# Patient Record
Sex: Female | Born: 1960
Health system: Southern US, Community
[De-identification: ages and names within clinical notes are randomized; demographics above are authoritative.]

## PROBLEM LIST (undated history)

## (undated) DIAGNOSIS — E119 Type 2 diabetes mellitus without complications: Secondary | ICD-10-CM

## (undated) DIAGNOSIS — J45909 Unspecified asthma, uncomplicated: Secondary | ICD-10-CM

## (undated) DIAGNOSIS — I77811 Abdominal aortic ectasia: Secondary | ICD-10-CM

## (undated) DIAGNOSIS — R0600 Dyspnea, unspecified: Secondary | ICD-10-CM

## (undated) DIAGNOSIS — E785 Hyperlipidemia, unspecified: Secondary | ICD-10-CM

## (undated) DIAGNOSIS — I509 Heart failure, unspecified: Secondary | ICD-10-CM

## (undated) DIAGNOSIS — J439 Emphysema, unspecified: Secondary | ICD-10-CM

## (undated) DIAGNOSIS — I10 Essential (primary) hypertension: Secondary | ICD-10-CM

## (undated) DIAGNOSIS — I2510 Atherosclerotic heart disease of native coronary artery without angina pectoris: Secondary | ICD-10-CM

## (undated) DIAGNOSIS — Z9289 Personal history of other medical treatment: Secondary | ICD-10-CM

## (undated) DIAGNOSIS — J449 Chronic obstructive pulmonary disease, unspecified: Secondary | ICD-10-CM

## (undated) DIAGNOSIS — I739 Peripheral vascular disease, unspecified: Secondary | ICD-10-CM

## (undated) DIAGNOSIS — R911 Solitary pulmonary nodule: Secondary | ICD-10-CM

## (undated) DIAGNOSIS — Z720 Tobacco use: Secondary | ICD-10-CM

## (undated) DIAGNOSIS — K219 Gastro-esophageal reflux disease without esophagitis: Secondary | ICD-10-CM

## (undated) DIAGNOSIS — M5030 Other cervical disc degeneration, unspecified cervical region: Secondary | ICD-10-CM

## (undated) DIAGNOSIS — Z951 Presence of aortocoronary bypass graft: Secondary | ICD-10-CM

## (undated) DIAGNOSIS — R002 Palpitations: Secondary | ICD-10-CM

## (undated) DIAGNOSIS — C801 Malignant (primary) neoplasm, unspecified: Secondary | ICD-10-CM

## (undated) DIAGNOSIS — R05 Cough: Secondary | ICD-10-CM

## (undated) DIAGNOSIS — J9809 Other diseases of bronchus, not elsewhere classified: Secondary | ICD-10-CM

## (undated) DIAGNOSIS — D75839 Thrombocytosis, unspecified: Secondary | ICD-10-CM

## (undated) DIAGNOSIS — I272 Pulmonary hypertension, unspecified: Secondary | ICD-10-CM

## (undated) DIAGNOSIS — R053 Chronic cough: Secondary | ICD-10-CM

## (undated) DIAGNOSIS — D649 Anemia, unspecified: Secondary | ICD-10-CM

## (undated) DIAGNOSIS — I700 Atherosclerosis of aorta: Secondary | ICD-10-CM

## (undated) DIAGNOSIS — I779 Disorder of arteries and arterioles, unspecified: Secondary | ICD-10-CM

## (undated) DIAGNOSIS — I2089 Other forms of angina pectoris: Secondary | ICD-10-CM

## (undated) DIAGNOSIS — A419 Sepsis, unspecified organism: Secondary | ICD-10-CM

## (undated) DIAGNOSIS — Z7982 Long term (current) use of aspirin: Secondary | ICD-10-CM

## (undated) DIAGNOSIS — I701 Atherosclerosis of renal artery: Secondary | ICD-10-CM

## (undated) DIAGNOSIS — Z91148 Patient's other noncompliance with medication regimen for other reason: Secondary | ICD-10-CM

## (undated) HISTORY — DX: Palpitations: R00.2

## (undated) HISTORY — DX: Type 2 diabetes mellitus without complications: E11.9

## (undated) HISTORY — PX: CARDIAC CATHETERIZATION: SHX172

## (undated) HISTORY — DX: Personal history of other medical treatment: Z92.89

## (undated) HISTORY — DX: Solitary pulmonary nodule: R91.1

## (undated) HISTORY — PX: LAPAROSCOPIC CHOLECYSTECTOMY: SUR755

## (undated) HISTORY — DX: Tobacco use: Z72.0

## (undated) HISTORY — DX: Chronic cough: R05.3

## (undated) HISTORY — PX: BREAST CYST EXCISION: SHX579

## (undated) HISTORY — DX: Disorder of arteries and arterioles, unspecified: I77.9

## (undated) HISTORY — DX: Cough: R05

## (undated) HISTORY — DX: Abdominal aortic ectasia: I77.811

## (undated) HISTORY — PX: DILATION AND CURETTAGE OF UTERUS: SHX78

## (undated) HISTORY — DX: Peripheral vascular disease, unspecified: I73.9

## (undated) HISTORY — DX: Atherosclerosis of renal artery: I70.1

---

## 1998-12-05 ENCOUNTER — Emergency Department (HOSPITAL_COMMUNITY): Admission: EM | Admit: 1998-12-05 | Discharge: 1998-12-05 | Payer: Self-pay | Admitting: Emergency Medicine

## 1999-05-11 ENCOUNTER — Encounter: Admission: RE | Admit: 1999-05-11 | Discharge: 1999-05-11 | Payer: Self-pay | Admitting: Internal Medicine

## 1999-05-14 ENCOUNTER — Encounter: Admission: RE | Admit: 1999-05-14 | Discharge: 1999-05-14 | Payer: Self-pay | Admitting: Internal Medicine

## 1999-06-06 ENCOUNTER — Encounter: Admission: RE | Admit: 1999-06-06 | Discharge: 1999-06-06 | Payer: Self-pay | Admitting: Internal Medicine

## 2000-05-31 ENCOUNTER — Encounter: Payer: Self-pay | Admitting: *Deleted

## 2000-05-31 ENCOUNTER — Inpatient Hospital Stay (HOSPITAL_COMMUNITY): Admission: AD | Admit: 2000-05-31 | Discharge: 2000-05-31 | Payer: Self-pay | Admitting: *Deleted

## 2000-08-21 ENCOUNTER — Encounter: Admission: RE | Admit: 2000-08-21 | Discharge: 2000-08-21 | Payer: Self-pay

## 2000-09-03 ENCOUNTER — Ambulatory Visit (HOSPITAL_COMMUNITY): Admission: RE | Admit: 2000-09-03 | Discharge: 2000-09-03 | Payer: Self-pay | Admitting: *Deleted

## 2001-02-01 ENCOUNTER — Emergency Department (HOSPITAL_COMMUNITY): Admission: EM | Admit: 2001-02-01 | Discharge: 2001-02-01 | Payer: Self-pay | Admitting: *Deleted

## 2001-08-17 ENCOUNTER — Emergency Department (HOSPITAL_COMMUNITY): Admission: EM | Admit: 2001-08-17 | Discharge: 2001-08-18 | Payer: Self-pay | Admitting: Emergency Medicine

## 2001-10-26 ENCOUNTER — Encounter: Admission: RE | Admit: 2001-10-26 | Discharge: 2001-10-26 | Payer: Self-pay

## 2001-12-09 ENCOUNTER — Encounter: Admission: RE | Admit: 2001-12-09 | Discharge: 2001-12-09 | Payer: Self-pay

## 2002-07-03 ENCOUNTER — Emergency Department (HOSPITAL_COMMUNITY): Admission: EM | Admit: 2002-07-03 | Discharge: 2002-07-03 | Payer: Self-pay | Admitting: Emergency Medicine

## 2002-07-03 ENCOUNTER — Encounter: Payer: Self-pay | Admitting: Emergency Medicine

## 2002-09-21 ENCOUNTER — Encounter: Admission: RE | Admit: 2002-09-21 | Discharge: 2002-09-21 | Payer: Self-pay | Admitting: Internal Medicine

## 2003-03-06 ENCOUNTER — Emergency Department (HOSPITAL_COMMUNITY): Admission: EM | Admit: 2003-03-06 | Discharge: 2003-03-06 | Payer: Self-pay | Admitting: *Deleted

## 2003-03-10 ENCOUNTER — Encounter: Admission: RE | Admit: 2003-03-10 | Discharge: 2003-03-10 | Payer: Self-pay | Admitting: Internal Medicine

## 2003-03-10 ENCOUNTER — Inpatient Hospital Stay (HOSPITAL_COMMUNITY): Admission: AD | Admit: 2003-03-10 | Discharge: 2003-03-11 | Payer: Self-pay | Admitting: Internal Medicine

## 2003-04-17 ENCOUNTER — Encounter: Payer: Self-pay | Admitting: Emergency Medicine

## 2003-04-17 ENCOUNTER — Emergency Department (HOSPITAL_COMMUNITY): Admission: EM | Admit: 2003-04-17 | Discharge: 2003-04-17 | Payer: Self-pay | Admitting: Emergency Medicine

## 2003-05-10 ENCOUNTER — Encounter: Payer: Self-pay | Admitting: Surgery

## 2003-05-10 ENCOUNTER — Ambulatory Visit (HOSPITAL_COMMUNITY): Admission: RE | Admit: 2003-05-10 | Discharge: 2003-05-10 | Payer: Self-pay | Admitting: Surgery

## 2003-07-01 ENCOUNTER — Encounter: Payer: Self-pay | Admitting: Surgery

## 2003-07-06 ENCOUNTER — Encounter (INDEPENDENT_AMBULATORY_CARE_PROVIDER_SITE_OTHER): Payer: Self-pay | Admitting: *Deleted

## 2003-07-06 ENCOUNTER — Encounter: Payer: Self-pay | Admitting: Surgery

## 2003-07-06 ENCOUNTER — Ambulatory Visit (HOSPITAL_COMMUNITY): Admission: RE | Admit: 2003-07-06 | Discharge: 2003-07-07 | Payer: Self-pay | Admitting: Surgery

## 2005-03-18 ENCOUNTER — Ambulatory Visit: Payer: Self-pay | Admitting: Internal Medicine

## 2005-03-31 ENCOUNTER — Emergency Department (HOSPITAL_COMMUNITY): Admission: EM | Admit: 2005-03-31 | Discharge: 2005-03-31 | Payer: Self-pay | Admitting: Emergency Medicine

## 2005-06-21 ENCOUNTER — Ambulatory Visit (HOSPITAL_COMMUNITY): Admission: RE | Admit: 2005-06-21 | Discharge: 2005-06-21 | Payer: Self-pay | Admitting: Internal Medicine

## 2005-06-21 ENCOUNTER — Ambulatory Visit: Payer: Self-pay | Admitting: Internal Medicine

## 2007-11-25 ENCOUNTER — Emergency Department (HOSPITAL_COMMUNITY): Admission: EM | Admit: 2007-11-25 | Discharge: 2007-11-25 | Payer: Self-pay | Admitting: Family Medicine

## 2011-03-15 NOTE — Discharge Summary (Signed)
   NAME:  Caitlyn Herrera, Caitlyn Herrera NO.:  [id number]   MEDICAL RECORD NO.:  [id number]                   PATIENT TYPE:  OIB   LOCATION:  5703                                 FACILITY:  [facility]   PHYSICIAN:  Thornton Park. Daphine Deutscher, M.D.             DATE OF BIRTH:  [birth year]   DATE OF ADMISSION:  07/06/2003  DATE OF DISCHARGE:  07/07/2003                                 DISCHARGE SUMMARY   ADMITTING DIAGNOSIS:  Chronic cholecystitis.   DISCHARGE DIAGNOSIS:  Chronic cholecystitis, moderately severe, status post  laparoscopic cholecystectomy with intraoperative cholangiogram.   COURSE IN THE HOSPITAL:  Caitlyn Herrera is a 50 year old black female  previously admitted to the teaching service with recurrent bouts of upper  abdominal pain and some gastroesophageal reflux.  She was taken to the  operating room on Wednesday morning, July 06, 2003, and underwent a  laparoscopic cholecystectomy.  She had marked inflammatory changes in the  infundibulum and it was stuck down there suggesting chronic cholecystitis of  moderate-to-severe nature.  She had adhesions to her duodenum, which were  taken down.  Her gallbladder was removed abdomen a cholangiogram looked  unremarkable.   Postoperatively she did well and was discharged in Thursday, July 07, 2003.   CONDITION ON DISCHARGE:  Improved.   PLAN:  Return to the office in three weeks.                                                  Thornton Park Daphine Deutscher, M.D.    MBM/MEDQ  D:  07/08/2003  T:  07/09/2003  Job:  [id number]   cc:   [facility]

## 2011-03-15 NOTE — Discharge Summary (Signed)
NAME:  Caitlyn Herrera, Caitlyn Herrera NO.:  [id number]   MEDICAL RECORD NO.:  [id number]                   PATIENT TYPE:  INP   LOCATION:  5712                                 FACILITY:  [facility]   PHYSICIAN:  Talmage Coin, M.D.                  DATE OF BIRTH:  1961/08/05   DATE OF ADMISSION:  03/10/2003  DATE OF DISCHARGE:  03/11/2003                                 DISCHARGE SUMMARY   DISCHARGE DIAGNOSES:  1. Nausea and vomiting with gastroesophageal reflux disease/peptic ulcer     disease.  2. Trichomoniasis.  3. Urine drug screen positive for cocaine and marijuana.  4. Questionable urinary tract infection.  5. Tobacco abuse.  6. Dysmenorrhea.   DISCHARGE MEDICATIONS:  1. Phenergan 12.5 mg p.o. q.4-6h. p.r.n. nausea and vomiting.  2. Protonix 40 mg p.o. q.d.  3. Vicodin 5/500 1-2 tablets p.o. q.4-6h. p.r.n. pain.   DISPOSITION:  The patient will follow up at [facility]  outpatient clinic with Dr. Zoe Lan on June 7 at 8:30 AM. She will be given  a trial of proton pump inhibitors until then to evaluate for GERD/PUD. If  symptoms do not resolve consider triple therapy for Helicobacter pylori  infection and/or EGD.   HISTORY OF PRESENT ILLNESS:  The patient is a 50 year old African American  female with nausea, vomiting of three months' duration in addition to mid  epigastric abdominal pain for the past week. The pain is worse after eating  spicy food and fried food and is relieved with a Percocet before eating. The  nausea is worse in the morning, so she avoids breakfast and usually improves  by noon enabling her to eat lunch and dinner. However, she can get nausea  and/or vomiting at any time of day.  She denies diarrhea, constipation,  hematemesis and hematochezia, but thinks she may have had bilious vomit  twice in the past week.   PHYSICAL EXAMINATION:  Findings on admission included temperature 97, pulse  70, blood pressure 196/83.   Respirations 15.  Significant physical exam  findings include a soft, obese, nondistended abdomen, diffusely tender to  palpation, but more so in the mid epigastric region. Positive soft bowel  sounds.  No hepatosplenomegaly. No masses. Stool was heme negative. The  remainder of the physical examination was within normal limits.   KEY ADMISSION LABS:  Acute abdominal series was negative for obstruction,  ileus and free air. EKG and cardiac enzymes were negative. LFTs and lipase  were within normal limits.   HOSPITAL COURSE:  1. Nausea, vomiting - likely GERD/PUD.  The patient was made NPO, received     IV fluids for hydration, morphine for pain, Zofran and Phenergan for     nausea and vomiting. Protonix for GERD/PUD prophylaxis. She was symptom     free for 24 hours and was switched to p.o. medication. Morphine was     discontinued  and she was started on Vicodin for pain and she remains pain     free. She was given potassium chloride to replete her potassium which was     3.4.  Her diet was advanced to clear liquids which she tolerated without     nausea and vomiting. Her diet was then advanced to regular which she also     tolerated without nausea or vomiting and she was ambulating without     difficulty.  2. Trichomoniasis. The patient was found to have trichomonas on urinalysis     and was treated with Flagyl 2 grams p.o. x 1.  3. Urine drug screen positive for cocaine and marijuana. The patient denied     cocaine use, but did acknowledge marijuana use about once a month and she     was counseled to avoid drugs.  4. Questionable UTI. Urinalysis showed small leukocytes, many bacteria and     many squamous epithelial cells.  The squamous epithelial cells increased     the likelihood of contamination. Urine culture is pending and the patient     will be treated with Cipro if urine culture is positive.  5. Tobacco abuse. The patient was counseled to avoid tobacco.  6. Dysmenorrhea. The  patient reports not having menses in the past few     months and reports having hot flashes. The patient likely to be     perimenopausal.  Uterine pregnancy test was negative. This will be worked     up further in an outpatient setting.   DISPOSITION:  The patient discharged home in stable condition. Will follow  up with Dr. Zoe Lan as mentioned above.     Oretha Ellis, M.D.                      Talmage Coin, M.D.    BT/MEDQ  D:  03/11/2003  T:  03/12/2003  Job:  [id number]   cc:   [facility]

## 2011-03-15 NOTE — Op Note (Signed)
   NAME:  Caitlyn Herrera, Caitlyn Herrera NO.:  [id number]   MEDICAL RECORD NO.:  [id number]                   PATIENT TYPE:  OIB   LOCATION:  5703                                 FACILITY:  [facility]   PHYSICIAN:  Thornton Park. Daphine Deutscher, M.D.             DATE OF BIRTH:  [birth year]   DATE OF PROCEDURE:  07/06/2003  DATE OF DISCHARGE:                                 OPERATIVE REPORT   PREOPERATIVE DIAGNOSIS:  Chronic cholecystitis.   POSTOPERATIVE DIAGNOSIS:  Chronic cholecystitis.   OPERATION PERFORMED:  Laparoscopic cholecystectomy with intraoperative  cholangiogram.   SURGEON:  Thornton Park. Daphine Deutscher, M.D.   ASSISTANT:  Anselm Pancoast. Zachery Dakins, M.D.   ANESTHESIA:  General endotracheal.   OPERATIVE FINDINGS:  Marked chronic cholecystitis.   DESCRIPTION OF PROCEDURE:  [name] is a 50 year old lady who was  taken to room 16 and given general anesthesia.  The abdomen was prepped with  Betadine and draped sterilely.  A longitudinal incision was made down to the  umbilicus and the Hasson cannula was inserted.  We came in through a bunch  of omental adhesions, went to the right and went up in and immediately saw  the gallbladder which was contracted, whitened and grayish and full of  stones.  It was elevated and I first dissected an impacted infundibulum off  the cystic duct.  I then put a clip up on the gallbladder, opened the cystic  duct and milked out a bunch of impacted stone material.  We then did a  cholangiogram which showed quick filling of the common duct and prompt flow  into the duodenum.  I then triple clipped the cystic duct, divided it and I  removed the gallbladder from the gallbladder bed without entering it.  Bleeding was controlled with clips.  Once detached, the gallbladder was  brought out through the umbilicus without difficulty.  The gallbladder bed  was irrigated and no bleeding or bile leaks were noted.  The umbilical port  was repaired with 0  Vicryl under laparoscopic vision.  Omentum was stuck up  around this to previous scar.  The abdomen was then deflated and trocars  were withdrawn and then the skin was closed with 4-0  Vicryl. The wounds  were injected with 0.5% Marcaine.  The patient tolerated the procedure well  and was taken to the recovery room in satisfactory condition.   FINAL DIAGNOSIS:  Marked chronic cholecystitis.                                               Thornton Park Daphine Deutscher, M.D.    MBM/MEDQ  D:  07/06/2003  T:  07/06/2003  Job:  [id number]

## 2011-07-18 LAB — INFLUENZA A AND B ANTIGEN (CONVERTED LAB): Inflenza A Ag: NEGATIVE

## 2011-08-12 ENCOUNTER — Emergency Department (HOSPITAL_COMMUNITY)
Admission: EM | Admit: 2011-08-12 | Discharge: 2011-08-12 | Disposition: A | Discharge status: Home / Self Care | Payer: Self-pay | Attending: Emergency Medicine | Admitting: Emergency Medicine

## 2011-08-12 DIAGNOSIS — N751 Abscess of Bartholin's gland: Secondary | ICD-10-CM | POA: Insufficient documentation

## 2011-08-12 DIAGNOSIS — A5901 Trichomonal vulvovaginitis: Secondary | ICD-10-CM | POA: Insufficient documentation

## 2011-08-12 DIAGNOSIS — N898 Other specified noninflammatory disorders of vagina: Secondary | ICD-10-CM | POA: Insufficient documentation

## 2011-08-12 DIAGNOSIS — H6120 Impacted cerumen, unspecified ear: Secondary | ICD-10-CM | POA: Insufficient documentation

## 2011-08-12 DIAGNOSIS — H9209 Otalgia, unspecified ear: Secondary | ICD-10-CM | POA: Insufficient documentation

## 2011-08-12 DIAGNOSIS — I10 Essential (primary) hypertension: Secondary | ICD-10-CM | POA: Insufficient documentation

## 2011-08-12 DIAGNOSIS — J029 Acute pharyngitis, unspecified: Secondary | ICD-10-CM | POA: Insufficient documentation

## 2011-08-12 DIAGNOSIS — N9089 Other specified noninflammatory disorders of vulva and perineum: Secondary | ICD-10-CM | POA: Insufficient documentation

## 2011-08-12 LAB — RAPID STREP SCREEN (MED CTR MEBANE ONLY): Streptococcus, Group A Screen (Direct): NEGATIVE

## 2011-08-12 LAB — WET PREP, GENITAL: Yeast Wet Prep HPF POC: NONE SEEN

## 2011-08-15 LAB — CULTURE, ROUTINE-ABSCESS

## 2016-12-11 ENCOUNTER — Encounter: Payer: Self-pay | Admitting: Emergency Medicine

## 2016-12-11 DIAGNOSIS — I10 Essential (primary) hypertension: Secondary | ICD-10-CM | POA: Diagnosis not present

## 2016-12-11 DIAGNOSIS — N390 Urinary tract infection, site not specified: Secondary | ICD-10-CM | POA: Diagnosis not present

## 2016-12-11 DIAGNOSIS — R112 Nausea with vomiting, unspecified: Secondary | ICD-10-CM | POA: Diagnosis present

## 2016-12-11 DIAGNOSIS — Z87891 Personal history of nicotine dependence: Secondary | ICD-10-CM | POA: Insufficient documentation

## 2016-12-11 DIAGNOSIS — Z79899 Other long term (current) drug therapy: Secondary | ICD-10-CM | POA: Diagnosis not present

## 2016-12-11 MED ORDER — ONDANSETRON HCL 4 MG/2ML IJ SOLN
4.0000 mg | Freq: Once | INTRAMUSCULAR | Status: AC | PRN
  Administered 2016-12-12: 4 mg via INTRAVENOUS
  Filled 2016-12-11: qty 2

## 2016-12-11 NOTE — ED Triage Notes (Signed)
Patient to ER for c/o N/V/D since Saturday with intermittent fever as high as 100.0. Patient also reports dark stools with generalized abd pain. Patient states she has had poor intake for last two days. Patient ambulatory to triage without difficulty. Mild tachycardia present throughout triage.

## 2016-12-12 ENCOUNTER — Emergency Department
Admission: EM | Admit: 2016-12-12 | Discharge: 2016-12-12 | Disposition: A | Discharge status: Home / Self Care | Payer: BLUE CROSS/BLUE SHIELD | Attending: Emergency Medicine | Admitting: Emergency Medicine

## 2016-12-12 DIAGNOSIS — R197 Diarrhea, unspecified: Secondary | ICD-10-CM

## 2016-12-12 DIAGNOSIS — R112 Nausea with vomiting, unspecified: Secondary | ICD-10-CM

## 2016-12-12 DIAGNOSIS — R1030 Lower abdominal pain, unspecified: Secondary | ICD-10-CM

## 2016-12-12 DIAGNOSIS — N390 Urinary tract infection, site not specified: Secondary | ICD-10-CM

## 2016-12-12 HISTORY — DX: Essential (primary) hypertension: I10

## 2016-12-12 HISTORY — DX: Hyperlipidemia, unspecified: E78.5

## 2016-12-12 LAB — COMPREHENSIVE METABOLIC PANEL
ALBUMIN: 3.6 g/dL (ref 3.5–5.0)
ALT: 17 U/L (ref 14–54)
ANION GAP: 9 (ref 5–15)
AST: 29 U/L (ref 15–41)
Alkaline Phosphatase: 80 U/L (ref 38–126)
BUN: 11 mg/dL (ref 6–20)
CHLORIDE: 106 mmol/L (ref 101–111)
CO2: 21 mmol/L — AB (ref 22–32)
Calcium: 8.5 mg/dL — ABNORMAL LOW (ref 8.9–10.3)
Creatinine, Ser: 0.9 mg/dL (ref 0.44–1.00)
GFR calc Af Amer: >60 mL/min (ref >60)
GFR calc non Af Amer: >60 mL/min (ref >60)
GLUCOSE: 167 mg/dL — AB (ref 65–99)
POTASSIUM: 3.8 mmol/L (ref 3.5–5.1)
SODIUM: 136 mmol/L (ref 135–145)
Total Bilirubin: 0.9 mg/dL (ref 0.3–1.2)
Total Protein: 7.9 g/dL (ref 6.5–8.1)

## 2016-12-12 LAB — CBC
HCT: 39.5 % (ref 35.0–47.0)
HEMOGLOBIN: 13.7 g/dL (ref 12.0–16.0)
MCH: 32 pg (ref 26.0–34.0)
MCHC: 34.8 g/dL (ref 32.0–36.0)
MCV: 91.9 fL (ref 80.0–100.0)
Platelets: 371 10*3/uL (ref 150–440)
RBC: 4.29 MIL/uL (ref 3.80–5.20)
RDW: 13.2 % (ref 11.5–14.5)
WBC: 17.4 10*3/uL — ABNORMAL HIGH (ref 3.6–11.0)

## 2016-12-12 LAB — LIPASE, BLOOD: LIPASE: 58 U/L — AB (ref 11–51)

## 2016-12-12 LAB — URINALYSIS, COMPLETE (UACMP) WITH MICROSCOPIC
Bilirubin Urine: NEGATIVE
Glucose, UA: 50 mg/dL — AB
Ketones, ur: NEGATIVE mg/dL
Nitrite: NEGATIVE
PROTEIN: 100 mg/dL — AB
Specific Gravity, Urine: 1.016 (ref 1.005–1.030)
pH: 5 (ref 5.0–8.0)

## 2016-12-12 MED ORDER — CEPHALEXIN 500 MG PO CAPS: 500.0000 mg | ORAL_CAPSULE | Freq: Three times a day (TID) | ORAL | 0 refills | Status: DC

## 2016-12-12 MED ORDER — CEFTRIAXONE SODIUM-DEXTROSE 1-3.74 GM-% IV SOLR: 1.0000 g | Freq: Once | INTRAVENOUS | Status: DC

## 2016-12-12 MED ORDER — MORPHINE SULFATE (PF) 4 MG/ML IV SOLN
4.0000 mg | Freq: Once | INTRAVENOUS | Status: AC
  Administered 2016-12-12: 4 mg via INTRAVENOUS
  Filled 2016-12-12: qty 1

## 2016-12-12 MED ORDER — DEXTROSE 5 % IV SOLN: 1.0000 g | INTRAVENOUS | Status: DC

## 2016-12-12 MED ORDER — CEFTRIAXONE SODIUM-DEXTROSE 1-3.74 GM-% IV SOLR
1.0000 g | INTRAVENOUS | Status: DC
  Administered 2016-12-12: 1 g via INTRAVENOUS

## 2016-12-12 MED ORDER — ONDANSETRON 4 MG PO TBDP: 4.0000 mg | ORAL_TABLET | Freq: Three times a day (TID) | ORAL | 0 refills | Status: DC | PRN

## 2016-12-12 MED ORDER — ONDANSETRON HCL 4 MG/2ML IJ SOLN
4.0000 mg | Freq: Once | INTRAMUSCULAR | Status: AC
  Administered 2016-12-12: 4 mg via INTRAVENOUS
  Filled 2016-12-12: qty 2

## 2016-12-12 MED ORDER — CEFTRIAXONE SODIUM-DEXTROSE 1-3.74 GM-% IV SOLR
INTRAVENOUS | Status: AC
  Administered 2016-12-12: 1 g via INTRAVENOUS
  Filled 2016-12-12: qty 50

## 2016-12-12 MED ORDER — SODIUM CHLORIDE 0.9 % IV BOLUS (SEPSIS)
1000.0000 mL | Freq: Once | INTRAVENOUS | Status: AC
Start: 2016-12-12 — End: 2016-12-12
  Administered 2016-12-12: 1000 mL via INTRAVENOUS

## 2016-12-12 NOTE — ED Provider Notes (Signed)
[facility] Emergency Department Provider Note   ____________________________________________   First MD Initiated Contact with Patient 12/12/16 [phone]     (approximate)  I have reviewed the triage vital signs and the nursing notes.   HISTORY  Chief Complaint Emesis and Diarrhea    HPI Caitlyn Herrera is a 56 y.o. female who presents to the ED from home with a chief complaint of nausea/vomiting/diarrhea and suprapubic abdominal pain. Onset of symptoms 5 days ago with intermittent fevers; maximum temperature 100F. Patient also notes dark stools. States she has had decreased oral intake for the past 2 days. Denies associated chest pain, shortness of breath, dysuria. Denies recent travel or trauma. Nothing makes her symptoms better or worse. Patient is not on anticoagulants.   Past Medical History:  Diagnosis Date  . Hyperlipidemia   . Hypertension     There are no active problems to display for this patient.   Past Surgical History:  Procedure Laterality Date  . CHOLECYSTECTOMY    . CYST EXCISION     Right breast    Prior to Admission medications   Not on File    Allergies Patient has no known allergies.  No family history on file.  Social History Social History  Substance Use Topics  . Smoking status: Former Games developermoker  . Smokeless tobacco: Never Used  . Alcohol use No    Review of Systems  Constitutional: Positive for fever. Eyes: No visual changes. ENT: No sore throat. Cardiovascular: Denies chest pain. Respiratory: Denies shortness of breath. Gastrointestinal: Positive for abdominal pain, nausea, vomiting and diarrhea.  No constipation. Genitourinary: Negative for dysuria. Musculoskeletal: Negative for back pain. Skin: Negative for rash. Neurological: Negative for headaches, focal weakness or numbness.  10-point ROS otherwise negative.  ____________________________________________   PHYSICAL EXAM:  VITAL SIGNS: ED  Triage Vitals  Enc Vitals Group     BP 12/11/16 2347 (!) 163/83     Pulse Rate 12/11/16 2347 (!) 118     Resp 12/11/16 2347 20     Temp 12/11/16 2347 99.1 F (37.3 C)     Temp Source 12/11/16 2347 Oral     SpO2 12/11/16 2347 99 %     Weight 12/11/16 2355 220 lb (99.8 kg)     Height 12/11/16 2355 5\' 7"  (1.702 m)     Head Circumference --      Peak Flow --      Pain Score 12/11/16 2356 10     Pain Loc --      Pain Edu? --      Excl. in GC? --     Constitutional: Alert and oriented. Well appearing and in no acute distress. Eyes: Conjunctivae are normal. PERRL. EOMI. Head: Atraumatic. Nose: No congestion/rhinnorhea. Mouth/Throat: Mucous membranes are moist.  Oropharynx non-erythematous. Neck: No stridor.   Cardiovascular: Normal rate, regular rhythm. Grossly normal heart sounds.  Good peripheral circulation. Respiratory: Normal respiratory effort.  No retractions. Lungs CTAB. Gastrointestinal: Soft and mildly tender to palpation and suprapubic area without rebound or guarding. No distention. No abdominal bruits. No CVA tenderness. Musculoskeletal: No lower extremity tenderness nor edema.  No joint effusions. Neurologic:  Normal speech and language. No gross focal neurologic deficits are appreciated. No gait instability. Skin:  Skin is warm, dry and intact. No rash noted. Psychiatric: Mood and affect are normal. Speech and behavior are normal.  ____________________________________________   LABS (all labs ordered are listed, but only abnormal results are displayed)  Labs Reviewed  CBC -  Abnormal; Notable for the following:       Result Value   WBC 17.4 (*)    All other components within normal limits  URINALYSIS, COMPLETE (UACMP) WITH MICROSCOPIC - Abnormal; Notable for the following:    Color, Urine YELLOW (*)    APPearance HAZY (*)    Glucose, UA 50 (*)    Hgb urine dipstick MODERATE (*)    Protein, ur 100 (*)    Leukocytes, UA SMALL (*)    Bacteria, UA RARE (*)     Squamous Epithelial / LPF 0-5 (*)    All other components within normal limits  LIPASE, BLOOD - Abnormal; Notable for the following:    Lipase 58 (*)    All other components within normal limits  COMPREHENSIVE METABOLIC PANEL - Abnormal; Notable for the following:    CO2 21 (*)    Glucose, Bld 167 (*)    Calcium 8.5 (*)    All other components within normal limits   ____________________________________________  EKG  ED ECG REPORT I, Stephenson Cichy J, the attending physician, personally viewed and interpreted this ECG.   Date: 12/12/2016  EKG Time: 0001  Rate: 101  Rhythm: sinus tachycardia  Axis: Normal  Intervals:none  ST&T Change: Nonspecific  ____________________________________________  RADIOLOGY  None ____________________________________________   PROCEDURES  Procedure(s) performed:   Rectal exam: External exam with nonthrombosed, nonbleeding hemorrhoid. Tan stool obtained on gloved finger which is heme negative.  Procedures  Critical Care performed: No  ____________________________________________   INITIAL IMPRESSION / ASSESSMENT AND PLAN / ED COURSE  Pertinent labs & imaging results that were available during my care of the patient were reviewed by me and considered in my medical decision making (see chart for details).  56 year old female who presents with suprapubic abdominal pain, nausea, vomiting and diarrhea. Laboratory urinalysis results remarkable for leukocytosis secondary to UTI. Abdominal exam is benign and patient does not have pain at McBurney's point; low suspicion for acute appendicitis especially in light of patient's 5 day illness. Will infuse IV fluids, analgesics, antiemetics, antibiotics and reassess.  Clinical Course as of Dec 12 520  Thu Dec 12, 2016  9604 Patient tolerated PO without emesis. Feeling much better. Will discharge home with prescriptions for Zofran and Keflex. Strict return precautions given. Patient verbalizes  understanding and agrees with plan of care.  [JS]    Clinical Course User Index [JS] Irean Hong, MD     ____________________________________________   FINAL CLINICAL IMPRESSION(S) / ED DIAGNOSES  Final diagnoses:  Nausea vomiting and diarrhea  Lower abdominal pain  Lower urinary tract infectious disease      NEW MEDICATIONS STARTED DURING THIS VISIT:  New Prescriptions   No medications on file     Note:  This document was prepared using Dragon voice recognition software and may include unintentional dictation errors.    Irean Hong, MD 12/12/16 [phone]

## 2016-12-12 NOTE — Discharge Instructions (Signed)
1. Take antibiotic as prescribed (Keflex 500 mg 3 times daily ×7 days). °2. You may take Zofran as needed for nausea. °3. Clear liquids ×12 hours, then BRAT diet ×3 days, then slowly advance diet as tolerated.  °4. Return to the ER for worsening symptoms, persistent vomiting, difficulty breathing or other concerns. °

## 2016-12-18 ENCOUNTER — Encounter (HOSPITAL_COMMUNITY): Payer: Self-pay | Admitting: Emergency Medicine

## 2016-12-18 ENCOUNTER — Ambulatory Visit (HOSPITAL_COMMUNITY)
Admission: EM | Admit: 2016-12-18 | Discharge: 2016-12-18 | Discharge status: Against Medical Advice | Payer: BLUE CROSS/BLUE SHIELD | Attending: Family Medicine | Admitting: Family Medicine

## 2016-12-18 NOTE — ED Triage Notes (Signed)
[facility] a [facility] pain x 3 days. The patient denied any known injury.

## 2017-10-28 DIAGNOSIS — I214 Non-ST elevation (NSTEMI) myocardial infarction: Secondary | ICD-10-CM

## 2017-10-28 HISTORY — DX: Non-ST elevation (NSTEMI) myocardial infarction: I21.4

## 2017-11-04 ENCOUNTER — Encounter: Payer: Self-pay | Admitting: Emergency Medicine

## 2017-11-04 ENCOUNTER — Encounter
Admission: EM | Disposition: A | Discharge status: Other Acute Inpatient Hospital | Payer: Self-pay | Source: Home / Self Care | Attending: Internal Medicine

## 2017-11-04 ENCOUNTER — Emergency Department: Payer: BLUE CROSS/BLUE SHIELD

## 2017-11-04 ENCOUNTER — Inpatient Hospital Stay
Admission: EM | Admit: 2017-11-04 | Discharge: 2017-11-05 | DRG: 282 | Disposition: A | Discharge status: Other Acute Inpatient Hospital | Payer: BLUE CROSS/BLUE SHIELD | Attending: Internal Medicine | Admitting: Internal Medicine

## 2017-11-04 ENCOUNTER — Inpatient Hospital Stay (HOSPITAL_COMMUNITY)
Admit: 2017-11-04 | Discharge: 2017-11-04 | Disposition: A | Discharge status: Home / Self Care | Payer: BLUE CROSS/BLUE SHIELD | Attending: Internal Medicine | Admitting: Internal Medicine

## 2017-11-04 ENCOUNTER — Other Ambulatory Visit: Payer: Self-pay

## 2017-11-04 DIAGNOSIS — Z720 Tobacco use: Secondary | ICD-10-CM | POA: Diagnosis not present

## 2017-11-04 DIAGNOSIS — I25110 Atherosclerotic heart disease of native coronary artery with unstable angina pectoris: Secondary | ICD-10-CM | POA: Diagnosis present

## 2017-11-04 DIAGNOSIS — E785 Hyperlipidemia, unspecified: Secondary | ICD-10-CM

## 2017-11-04 DIAGNOSIS — I2510 Atherosclerotic heart disease of native coronary artery without angina pectoris: Secondary | ICD-10-CM | POA: Diagnosis not present

## 2017-11-04 DIAGNOSIS — I10 Essential (primary) hypertension: Secondary | ICD-10-CM | POA: Diagnosis present

## 2017-11-04 DIAGNOSIS — F17200 Nicotine dependence, unspecified, uncomplicated: Secondary | ICD-10-CM | POA: Diagnosis present

## 2017-11-04 DIAGNOSIS — I200 Unstable angina: Secondary | ICD-10-CM | POA: Diagnosis not present

## 2017-11-04 DIAGNOSIS — I214 Non-ST elevation (NSTEMI) myocardial infarction: Principal | ICD-10-CM | POA: Diagnosis present

## 2017-11-04 DIAGNOSIS — Z01810 Encounter for preprocedural cardiovascular examination: Secondary | ICD-10-CM | POA: Diagnosis not present

## 2017-11-04 DIAGNOSIS — E7800 Pure hypercholesterolemia, unspecified: Secondary | ICD-10-CM | POA: Diagnosis not present

## 2017-11-04 DIAGNOSIS — E1159 Type 2 diabetes mellitus with other circulatory complications: Secondary | ICD-10-CM | POA: Diagnosis not present

## 2017-11-04 DIAGNOSIS — E1165 Type 2 diabetes mellitus with hyperglycemia: Secondary | ICD-10-CM | POA: Diagnosis present

## 2017-11-04 DIAGNOSIS — R778 Other specified abnormalities of plasma proteins: Secondary | ICD-10-CM

## 2017-11-04 DIAGNOSIS — Z8249 Family history of ischemic heart disease and other diseases of the circulatory system: Secondary | ICD-10-CM

## 2017-11-04 DIAGNOSIS — I361 Nonrheumatic tricuspid (valve) insufficiency: Secondary | ICD-10-CM | POA: Diagnosis not present

## 2017-11-04 DIAGNOSIS — I493 Ventricular premature depolarization: Secondary | ICD-10-CM | POA: Diagnosis not present

## 2017-11-04 DIAGNOSIS — Z79899 Other long term (current) drug therapy: Secondary | ICD-10-CM | POA: Diagnosis not present

## 2017-11-04 DIAGNOSIS — R7989 Other specified abnormal findings of blood chemistry: Secondary | ICD-10-CM

## 2017-11-04 DIAGNOSIS — Z794 Long term (current) use of insulin: Secondary | ICD-10-CM | POA: Diagnosis not present

## 2017-11-04 HISTORY — PX: LEFT HEART CATH AND CORONARY ANGIOGRAPHY: CATH118249

## 2017-11-04 HISTORY — DX: Non-ST elevation (NSTEMI) myocardial infarction: I21.4

## 2017-11-04 LAB — BASIC METABOLIC PANEL
ANION GAP: 10 (ref 5–15)
ANION GAP: 8 (ref 5–15)
BUN: 11 mg/dL (ref 6–20)
BUN: 12 mg/dL (ref 6–20)
CALCIUM: 9.7 mg/dL (ref 8.9–10.3)
CO2: 26 mmol/L (ref 22–32)
CO2: 26 mmol/L (ref 22–32)
Calcium: 10.1 mg/dL (ref 8.9–10.3)
Chloride: 101 mmol/L (ref 101–111)
Chloride: 104 mmol/L (ref 101–111)
Creatinine, Ser: 1.17 mg/dL — ABNORMAL HIGH (ref 0.44–1.00)
Creatinine, Ser: 1.13 mg/dL — ABNORMAL HIGH (ref 0.44–1.00)
GFR calc Af Amer: 58 mL/min — ABNORMAL LOW (ref >60)
GFR calc Af Amer: >60 mL/min (ref >60)
GFR, EST NON AFRICAN AMERICAN: 50 mL/min — AB (ref >60)
GFR, EST NON AFRICAN AMERICAN: 53 mL/min — AB (ref >60)
GLUCOSE: 129 mg/dL — AB (ref 65–99)
Glucose, Bld: 123 mg/dL — ABNORMAL HIGH (ref 65–99)
POTASSIUM: 4 mmol/L (ref 3.5–5.1)
Potassium: 4.2 mmol/L (ref 3.5–5.1)
SODIUM: 138 mmol/L (ref 135–145)
Sodium: 137 mmol/L (ref 135–145)

## 2017-11-04 LAB — PROTIME-INR
INR: 0.91
Prothrombin Time: 12.2 seconds (ref 11.4–15.2)

## 2017-11-04 LAB — LIPID PANEL
CHOLESTEROL: 228 mg/dL — AB (ref 0–200)
HDL: 34 mg/dL — ABNORMAL LOW (ref >40)
LDL Cholesterol: 172 mg/dL — ABNORMAL HIGH (ref 0–99)
Total CHOL/HDL Ratio: 6.7 RATIO
Triglycerides: 110 mg/dL (ref <150)
VLDL: 22 mg/dL (ref 0–40)

## 2017-11-04 LAB — TROPONIN I
TROPONIN I: 0.29 ng/mL — AB (ref <0.03)
TROPONIN I: 1.67 ng/mL — AB (ref <0.03)
TROPONIN I: 1.91 ng/mL — AB (ref <0.03)
Troponin I: 0.94 ng/mL (ref <0.03)

## 2017-11-04 LAB — CBC
HCT: 43.2 % (ref 35.0–47.0)
Hemoglobin: 14.8 g/dL (ref 12.0–16.0)
MCH: 32.1 pg (ref 26.0–34.0)
MCHC: 34.3 g/dL (ref 32.0–36.0)
MCV: 93.7 fL (ref 80.0–100.0)
PLATELETS: 406 10*3/uL (ref 150–440)
RBC: 4.62 MIL/uL (ref 3.80–5.20)
RDW: 13.1 % (ref 11.5–14.5)
WBC: 10.5 10*3/uL (ref 3.6–11.0)

## 2017-11-04 LAB — HEMOGLOBIN A1C
HEMOGLOBIN A1C: 6.6 % — AB (ref 4.8–5.6)
MEAN PLASMA GLUCOSE: 142.72 mg/dL

## 2017-11-04 LAB — ECHOCARDIOGRAM COMPLETE
HEIGHTINCHES: 66 in
Weight: 2982.4 oz

## 2017-11-04 LAB — APTT: aPTT: 29 seconds (ref 24–36)

## 2017-11-04 SURGERY — LEFT HEART CATH AND CORONARY ANGIOGRAPHY
Anesthesia: Moderate Sedation

## 2017-11-04 MED ORDER — MORPHINE SULFATE (PF) 4 MG/ML IV SOLN
4.0000 mg | Freq: Once | INTRAVENOUS | Status: AC
  Administered 2017-11-04: 4 mg via INTRAVENOUS

## 2017-11-04 MED ORDER — SODIUM CHLORIDE 0.9 % IV SOLN
INTRAVENOUS | Status: AC
  Administered 2017-11-04: 17:00:00 via INTRAVENOUS

## 2017-11-04 MED ORDER — ONDANSETRON HCL 4 MG/2ML IJ SOLN
INTRAMUSCULAR | Status: AC
  Filled 2017-11-04: qty 2

## 2017-11-04 MED ORDER — METOPROLOL TARTRATE 25 MG PO TABS: 12.5000 mg | ORAL_TABLET | Freq: Two times a day (BID) | ORAL | Status: DC

## 2017-11-04 MED ORDER — ACETAMINOPHEN 325 MG PO TABS
ORAL_TABLET | ORAL | Status: AC
  Filled 2017-11-04: qty 2

## 2017-11-04 MED ORDER — SODIUM CHLORIDE 0.9 % IV SOLN: 250.0000 mL | INTRAVENOUS | Status: DC | PRN

## 2017-11-04 MED ORDER — FENTANYL CITRATE (PF) 100 MCG/2ML IJ SOLN
INTRAMUSCULAR | Status: AC
  Filled 2017-11-04: qty 2

## 2017-11-04 MED ORDER — HEPARIN (PORCINE) IN NACL 100-0.45 UNIT/ML-% IJ SOLN
INTRAMUSCULAR | Status: AC
  Filled 2017-11-04: qty 250

## 2017-11-04 MED ORDER — SODIUM CHLORIDE 0.9% FLUSH
3.0000 mL | Freq: Two times a day (BID) | INTRAVENOUS | Status: DC
  Administered 2017-11-04: 3 mL via INTRAVENOUS

## 2017-11-04 MED ORDER — NITROGLYCERIN 0.4 MG SL SUBL
0.4000 mg | SUBLINGUAL_TABLET | SUBLINGUAL | Status: DC | PRN
Start: 2017-11-04 — End: 2017-11-04

## 2017-11-04 MED ORDER — MIDAZOLAM HCL 2 MG/2ML IJ SOLN
INTRAMUSCULAR | Status: AC
  Filled 2017-11-04: qty 2

## 2017-11-04 MED ORDER — HEPARIN (PORCINE) IN NACL 2-0.9 UNIT/ML-% IJ SOLN
INTRAMUSCULAR | Status: AC
  Filled 2017-11-04: qty 500

## 2017-11-04 MED ORDER — ASPIRIN 81 MG PO CHEW: 324.0000 mg | CHEWABLE_TABLET | ORAL | Status: AC

## 2017-11-04 MED ORDER — METOPROLOL TARTRATE 25 MG PO TABS
25.0000 mg | ORAL_TABLET | Freq: Two times a day (BID) | ORAL | Status: DC
  Administered 2017-11-04: 25 mg via ORAL
  Filled 2017-11-04: qty 1

## 2017-11-04 MED ORDER — NITROGLYCERIN 0.4 MG SL SUBL
0.4000 mg | SUBLINGUAL_TABLET | SUBLINGUAL | Status: DC | PRN
  Administered 2017-11-04 (×2): 0.4 mg via SUBLINGUAL

## 2017-11-04 MED ORDER — PREMIER PROTEIN SHAKE
11.0000 [oz_av] | Freq: Two times a day (BID) | ORAL | Status: DC
  Administered 2017-11-04 – 2017-11-05 (×2): 11 [oz_av] via ORAL

## 2017-11-04 MED ORDER — ASPIRIN EC 325 MG PO TBEC
325.0000 mg | DELAYED_RELEASE_TABLET | Freq: Once | ORAL | Status: AC
  Administered 2017-11-04: 325 mg via ORAL

## 2017-11-04 MED ORDER — ASPIRIN 300 MG RE SUPP: 300.0000 mg | RECTAL | Status: AC

## 2017-11-04 MED ORDER — ONDANSETRON HCL 4 MG/2ML IJ SOLN: 4.0000 mg | Freq: Four times a day (QID) | INTRAMUSCULAR | Status: DC | PRN

## 2017-11-04 MED ORDER — IOPAMIDOL (ISOVUE-300) INJECTION 61%
INTRAVENOUS | Status: DC | PRN
  Administered 2017-11-04: 40 mL via INTRA_ARTERIAL

## 2017-11-04 MED ORDER — NITROGLYCERIN 0.4 MG SL SUBL
0.4000 mg | SUBLINGUAL_TABLET | SUBLINGUAL | Status: DC | PRN
  Administered 2017-11-04: 0.4 mg via SUBLINGUAL

## 2017-11-04 MED ORDER — HEPARIN SODIUM (PORCINE) 1000 UNIT/ML IJ SOLN
INTRAMUSCULAR | Status: DC | PRN
  Administered 2017-11-04: 4000 [IU] via INTRAVENOUS

## 2017-11-04 MED ORDER — ACETAMINOPHEN 325 MG PO TABS
650.0000 mg | ORAL_TABLET | Freq: Once | ORAL | Status: AC
  Administered 2017-11-04: 650 mg via ORAL

## 2017-11-04 MED ORDER — METOPROLOL TARTRATE 12.5 MG HALF TABLET
12.5000 mg | ORAL_TABLET | Freq: Two times a day (BID) | ORAL | Status: DC
  Administered 2017-11-04 – 2017-11-05 (×2): 12.5 mg via ORAL
  Filled 2017-11-04 (×3): qty 1

## 2017-11-04 MED ORDER — MORPHINE SULFATE (PF) 2 MG/ML IV SOLN
2.0000 mg | INTRAVENOUS | Status: DC | PRN
  Administered 2017-11-05: 2 mg via INTRAVENOUS
  Filled 2017-11-04: qty 1

## 2017-11-04 MED ORDER — SODIUM CHLORIDE 0.9% FLUSH: 3.0000 mL | INTRAVENOUS | Status: DC | PRN

## 2017-11-04 MED ORDER — HEPARIN SODIUM (PORCINE) 1000 UNIT/ML IJ SOLN
INTRAMUSCULAR | Status: AC
  Filled 2017-11-04: qty 1

## 2017-11-04 MED ORDER — NITROGLYCERIN 2 % TD OINT
TOPICAL_OINTMENT | TRANSDERMAL | Status: AC
  Filled 2017-11-04: qty 1

## 2017-11-04 MED ORDER — DEXTROSE-NACL 5-0.9 % IV SOLN
INTRAVENOUS | Status: DC
  Administered 2017-11-04: 06:00:00 via INTRAVENOUS

## 2017-11-04 MED ORDER — MIDAZOLAM HCL 2 MG/2ML IJ SOLN
INTRAMUSCULAR | Status: DC | PRN
  Administered 2017-11-04: 1 mg via INTRAVENOUS

## 2017-11-04 MED ORDER — VERAPAMIL HCL 2.5 MG/ML IV SOLN
INTRAVENOUS | Status: DC | PRN
  Administered 2017-11-04: 2.5 mg via INTRA_ARTERIAL

## 2017-11-04 MED ORDER — HEPARIN (PORCINE) IN NACL 100-0.45 UNIT/ML-% IJ SOLN
1000.0000 [IU]/h | INTRAMUSCULAR | Status: DC
  Administered 2017-11-04: 1000 [IU]/h via INTRAVENOUS

## 2017-11-04 MED ORDER — ACETAMINOPHEN 325 MG PO TABS
650.0000 mg | ORAL_TABLET | ORAL | Status: DC | PRN
  Administered 2017-11-04 – 2017-11-05 (×2): 650 mg via ORAL
  Filled 2017-11-04 (×2): qty 2

## 2017-11-04 MED ORDER — SODIUM CHLORIDE 0.9 % IV SOLN
INTRAVENOUS | Status: AC | PRN
  Administered 2017-11-04: 250 mL via INTRAVENOUS

## 2017-11-04 MED ORDER — VERAPAMIL HCL 2.5 MG/ML IV SOLN
INTRAVENOUS | Status: AC
  Filled 2017-11-04: qty 2

## 2017-11-04 MED ORDER — MORPHINE SULFATE (PF) 4 MG/ML IV SOLN
INTRAVENOUS | Status: AC
  Filled 2017-11-04: qty 1

## 2017-11-04 MED ORDER — FENTANYL CITRATE (PF) 100 MCG/2ML IJ SOLN
INTRAMUSCULAR | Status: DC | PRN
  Administered 2017-11-04: 25 ug via INTRAVENOUS

## 2017-11-04 MED ORDER — HEPARIN (PORCINE) IN NACL 100-0.45 UNIT/ML-% IJ SOLN
1150.0000 [IU]/h | INTRAMUSCULAR | Status: DC
  Administered 2017-11-04: 1000 [IU]/h via INTRAVENOUS
  Administered 2017-11-05: 1150 [IU]/h via INTRAVENOUS
  Filled 2017-11-04: qty 250

## 2017-11-04 MED ORDER — SODIUM CHLORIDE 0.9 % WEIGHT BASED INFUSION: 3.0000 mL/kg/h | INTRAVENOUS | Status: DC

## 2017-11-04 MED ORDER — SODIUM CHLORIDE 0.9 % WEIGHT BASED INFUSION: 1.0000 mL/kg/h | INTRAVENOUS | Status: DC

## 2017-11-04 MED ORDER — SODIUM CHLORIDE 0.9 % IV SOLN
250.0000 mL | INTRAVENOUS | Status: DC | PRN
Start: 2017-11-04 — End: 2017-11-04

## 2017-11-04 MED ORDER — ATORVASTATIN CALCIUM 20 MG PO TABS
40.0000 mg | ORAL_TABLET | Freq: Every day | ORAL | Status: DC
  Administered 2017-11-04: 40 mg via ORAL
  Filled 2017-11-04: qty 2

## 2017-11-04 MED ORDER — NITROGLYCERIN 2 % TD OINT
0.5000 [in_us] | TOPICAL_OINTMENT | Freq: Once | TRANSDERMAL | Status: AC
  Administered 2017-11-04: 0.5 [in_us] via TOPICAL

## 2017-11-04 MED ORDER — ASPIRIN EC 81 MG PO TBEC
DELAYED_RELEASE_TABLET | ORAL | Status: AC
  Filled 2017-11-04: qty 4

## 2017-11-04 MED ORDER — ASPIRIN EC 81 MG PO TBEC
81.0000 mg | DELAYED_RELEASE_TABLET | Freq: Every day | ORAL | Status: DC
  Administered 2017-11-05: 81 mg via ORAL
  Filled 2017-11-04: qty 1

## 2017-11-04 MED ORDER — NITROGLYCERIN 0.4 MG SL SUBL
SUBLINGUAL_TABLET | SUBLINGUAL | Status: AC
  Administered 2017-11-04: 0.4 mg via SUBLINGUAL
  Filled 2017-11-04: qty 1

## 2017-11-04 MED ORDER — ONDANSETRON HCL 4 MG/2ML IJ SOLN
4.0000 mg | Freq: Once | INTRAMUSCULAR | Status: AC
  Administered 2017-11-04: 4 mg via INTRAVENOUS

## 2017-11-04 MED ORDER — HEPARIN BOLUS VIA INFUSION
4000.0000 [IU] | Freq: Once | INTRAVENOUS | Status: AC
  Administered 2017-11-04: 4000 [IU] via INTRAVENOUS
  Filled 2017-11-04: qty 4000

## 2017-11-04 MED ORDER — HEPARIN (PORCINE) IN NACL 100-0.45 UNIT/ML-% IJ SOLN: 1000.0000 [IU]/h | INTRAMUSCULAR | Status: DC

## 2017-11-04 SURGICAL SUPPLY — 6 items
CATH INFINITI 5 FR JL3.5 (CATHETERS) ×2 IMPLANT
DEVICE RAD TR BAND REGULAR (VASCULAR PRODUCTS) ×2 IMPLANT
GLIDESHEATH SLEND SS 6F .021 (SHEATH) ×2 IMPLANT
KIT MANI 3VAL PERCEP (MISCELLANEOUS) ×2 IMPLANT
PACK CARDIAC CATH (CUSTOM PROCEDURE TRAY) ×2 IMPLANT
WIRE ROSEN-J .035X260CM (WIRE) ×2 IMPLANT

## 2017-11-04 NOTE — Progress Notes (Signed)
Initial Nutrition Assessment  DOCUMENTATION CODES:   Not applicable  INTERVENTION:   Premier Protein BID, each supplement provides 160 kcal and 30 grams of protein.   NUTRITION DIAGNOSIS:   Unintentional weight loss related to acute illness as evidenced by 15 percent weight loss in 2 months.  GOAL:   Patient will meet greater than or equal to 90% of their needs  MONITOR:   PO intake, Supplement acceptance, Labs, Weight trends, I & O's  REASON FOR ASSESSMENT:   Malnutrition Screening Tool    ASSESSMENT:   57 y.o. female with a known history of hyperlipidemia, hypertension presented to the emergency room with chest pain found to have NSTEMI   Met with pt in room today. Pt reports poor appetite and oral intake for the past 2 months. Per chart, pt has lost 34lbs(15%) since February 2018; pt reports that she has lost 20lbs over the past 2 months. Unsure as to the significance of pt's weight loss as there is limited weight history in chart. Pt is currently NPO for cardiac cath today. RD will order Premier Protein when diet advanced.   Medications reviewed and include: aspirin, morphine, NaCl w/ dextrose '@75ml'$ /hr, zofran prn  Labs reviewed: creat 1.13(H)  Nutrition-Focused physical exam completed. Findings are no fat depletion, no muscle depletion, and no edema.   Diet Order:  Diet NPO time specified Except for: Sips with Meds  EDUCATION NEEDS:   Education needs have been addressed  Skin:  Reviewed RN Assessment  Last BM:  1/7  Height:   Ht Readings from Last 1 Encounters:  11/04/17 '5\' 6"'$  (1.676 m)    Weight:   Wt Readings from Last 1 Encounters:  11/04/17 186 lb (84.4 kg)    Ideal Body Weight:  59 kg  BMI:  Body mass index is 30.02 kg/m.  Estimated Nutritional Needs:   Kcal:  1600-1900kcal/day   Protein:  85-93g/day   Fluid:  >1.6L/day   Koleen Distance MS, RD, LDN Pager #[phone] After Hours Pager: [phone]

## 2017-11-04 NOTE — Interval H&P Note (Signed)
History and Physical Interval Note:  11/04/2017 3:28 PM  Caitlyn RobsonJoanna D Strub  has presented today for cardiac catheterization, with the diagnosis of NSTEMI  The various methods of treatment have been discussed with the patient and family. After consideration of risks, benefits and other options for treatment, the patient has consented to  Procedure(s): LEFT HEART CATH AND CORONARY ANGIOGRAPHY (N/A) as a surgical intervention .  The patient's history has been reviewed, patient examined, no change in status, stable for surgery.  I have reviewed the patient's chart and labs.  Questions were answered to the patient's satisfaction.    Cath Lab Visit (complete for each Cath Lab visit)  Clinical Evaluation Leading to the Procedure:   ACS: Yes.    Non-ACS:  N/A  Iysis Germain

## 2017-11-04 NOTE — ED Notes (Signed)
Pt to the er for chest pain that started on Sunday. Pt says she thought it was acid reflux and took OTC meds to treat. No relief from any of those meds. Pt came in tonight because pain was increased and nothing was working. Pt visibly in pain and uncomfortable.

## 2017-11-04 NOTE — H&P (Signed)
[facility] [place] at [facility]   PATIENT NAME: Caitlyn Herrera    MR#:  [id number]  DATE OF BIRTH:  May 27, 1961  DATE OF ADMISSION:  11/04/2017  PRIMARY CARE PHYSICIAN: Center, [name]   REQUESTING/REFERRING PHYSICIAN:   CHIEF COMPLAINT:   Chief Complaint  Patient presents with  . Chest Pain    HISTORY OF PRESENT ILLNESS: Caitlyn Herrera  is a 57 y.o. female with a known history of hyperlipidemia, hypertension presented to the emergency room with chest pain.  Pain started Sunday night while she was at work and continued until this morning..  The pain was located in the midsternal area and radiates all over the chest.  The pain radiates down both arms.  She also had some nausea and lightheadedness patient took Pepto-Bismol but did not help.  Initially the pain was 10 out of 10 but after coming to the emergency room and receiving medication the patients pain came down to 6 out of 10 on a scale of 1-10.  She was evaluated in the emergency room first set of troponin was elevated.  Patient was started on heparin drip for non-ST elevation MI.  No complaints of any shortness of breath.  Hospitalist service was consulted for further care.  PAST MEDICAL HISTORY:   Past Medical History:  Diagnosis Date  . Hyperlipidemia   . Hypertension     PAST SURGICAL HISTORY:  Past Surgical History:  Procedure Laterality Date  . CHOLECYSTECTOMY    . CYST EXCISION     Right breast    SOCIAL HISTORY:  Social History   Tobacco Use  . Smoking status: Former Games developer  . Smokeless tobacco: Never Used  Substance Use Topics  . Alcohol use: No    FAMILY HISTORY:  Family History  Problem Relation Age of Onset  . Hypertension Mother     DRUG ALLERGIES: No Known Allergies  REVIEW OF SYSTEMS:   CONSTITUTIONAL: No fever, fatigue or weakness.  EYES: No blurred or double vision.  EARS, NOSE, AND THROAT: No tinnitus or ear pain.  RESPIRATORY: No  cough, shortness of breath, wheezing or hemoptysis.  CARDIOVASCULAR: Has chest pain,  No orthopnea, edema.  GASTROINTESTINAL: No nausea, vomiting, diarrhea or abdominal pain.  GENITOURINARY: No dysuria, hematuria.  ENDOCRINE: No polyuria, nocturia,  HEMATOLOGY: No anemia, easy bruising or bleeding SKIN: No rash or lesion. MUSCULOSKELETAL: No joint pain or arthritis.   NEUROLOGIC: No tingling, numbness, weakness.  PSYCHIATRY: No anxiety or depression.   MEDICATIONS AT HOME:  Prior to Admission medications   Medication Sig Start Date End Date Taking? Authorizing Provider  ondansetron (ZOFRAN ODT) 4 MG disintegrating tablet Take 1 tablet (4 mg total) by mouth every 8 (eight) hours as needed for nausea or vomiting. 12/12/16  Yes Irean Hong, MD  amLODipine (NORVASC) 5 MG tablet Take 5 mg by mouth daily.    [provider]  cephALEXin (KEFLEX) 500 MG capsule Take 1 capsule (500 mg total) by mouth 3 (three) times daily. Patient not taking: Reported on 11/04/2017 12/12/16   Irean Hong, MD      PHYSICAL EXAMINATION:   VITAL SIGNS: Blood pressure (!) 151/79, pulse 63, temperature 97.9 F (36.6 C), temperature source Oral, resp. rate 17, height 5\' 6"  (1.676 m), weight 99.8 kg (220 lb), SpO2 96 %.  GENERAL:  57 y.o.-year-old patient lying in the bed with no acute distress.  EYES: Pupils equal, round, reactive to light and accommodation. No scleral icterus. Extraocular  muscles intact.  HEENT: Head atraumatic, normocephalic. Oropharynx and nasopharynx clear.  NECK:  Supple, no jugular venous distention. No thyroid enlargement, no tenderness.  LUNGS: Normal breath sounds bilaterally, no wheezing, rales,rhonchi or crepitation. No use of accessory muscles of respiration.  CARDIOVASCULAR: S1, S2 normal. No murmurs, rubs, or gallops.  ABDOMEN: Soft, nontender, nondistended. Bowel sounds present. No organomegaly or mass.  EXTREMITIES: No pedal edema, cyanosis, or clubbing.  NEUROLOGIC:  Cranial nerves II through XII are intact. Muscle strength 5/5 in all extremities. Sensation intact. Gait not checked.  PSYCHIATRIC: The patient is alert and oriented x 3.  SKIN: No obvious rash, lesion, or ulcer.   LABORATORY PANEL:   CBC Recent Labs  Lab 11/04/17 0037  WBC 10.5  HGB 14.8  HCT 43.2  PLT 406  MCV 93.7  MCH 32.1  MCHC 34.3  RDW 13.1   ------------------------------------------------------------------------------------------------------------------  Chemistries  Recent Labs  Lab 11/04/17 0037  NA 137  K 4.2  CL 101  CO2 26  GLUCOSE 129*  BUN 11  CREATININE 1.17*  CALCIUM 10.1   ------------------------------------------------------------------------------------------------------------------ estimated creatinine clearance is 62.5 mL/min (A) (by C-G formula based on SCr of 1.17 mg/dL (H)). ------------------------------------------------------------------------------------------------------------------ No results for input(s): TSH, T4TOTAL, T3FREE, THYROIDAB in the last 72 hours.  Invalid input(s): FREET3   Coagulation profile No results for input(s): INR, PROTIME in the last 168 hours. ------------------------------------------------------------------------------------------------------------------- No results for input(s): DDIMER in the last 72 hours. -------------------------------------------------------------------------------------------------------------------  Cardiac Enzymes Recent Labs  Lab 11/04/17 0037  TROPONINI 0.29*   ------------------------------------------------------------------------------------------------------------------ Invalid input(s): POCBNP  ---------------------------------------------------------------------------------------------------------------  Urinalysis    Component Value Date/Time   COLORURINE YELLOW (A) 12/11/2016 2353   APPEARANCEUR HAZY (A) 12/11/2016 2353   LABSPEC 1.016 12/11/2016 2353   PHURINE  5.0 12/11/2016 2353   GLUCOSEU 50 (A) 12/11/2016 2353   HGBUR MODERATE (A) 12/11/2016 2353   BILIRUBINUR NEGATIVE 12/11/2016 2353   KETONESUR NEGATIVE 12/11/2016 2353   PROTEINUR 100 (A) 12/11/2016 2353   NITRITE NEGATIVE 12/11/2016 2353   LEUKOCYTESUR SMALL (A) 12/11/2016 2353     RADIOLOGY: Dg Chest 2 View  Result Date: 11/04/2017 CLINICAL DATA:  Left-sided chest pain. EXAM: CHEST  2 VIEW COMPARISON:  None. FINDINGS: The cardiomediastinal contours are normal. Mild biapical pleuroparenchymal scarring. Pulmonary vasculature is normal. No consolidation, pleural effusion, or pneumothorax. No acute osseous abnormalities are seen. IMPRESSION: No acute abnormality. Electronically Signed   By: Rubye OaksMelanie  Ehinger M.D.   On: 11/04/2017 03:12    EKG: Orders placed or performed during the hospital encounter of 11/04/17  . ED EKG within 10 minutes  . ED EKG within 10 minutes  . EKG 12-Lead  . EKG 12-Lead    IMPRESSION AND PLAN: 57 year old female patient with history of hypertension, hyperlipidemia presented to the emergency room with chest pain.  Admitting diagnosis : 1.  Non-ST elevation MI 2.  Hypertension 3.  Hyperlipidemia  Treatment plan :  Admit patient to telemetry inpatient service : IV heparin drip for anticoagulation Start patient on aspirin and beta-blocker Nitrates and morphine for chest pain Cardiology consultation Echocardiogram  All the records are reviewed and case discussed with ED provider. Management plans discussed with the patient, family and they are in agreement.  CODE STATUS:FULL CODE Code Status History    This patient does not have a recorded code status. Please follow your organizational policy for patients in this situation.       TOTAL TIME TAKING CARE OF THIS PATIENT: 50 minutes.    Pavan Pyreddy M.D  on 11/04/2017 at 3:17 AM  Between 7am to 6pm - Pager - [phone]  After 6pm go to [email or web address] - password [email or web address] [facility]  [place]  [facility]  Office  [phone]  CC: Primary care physician; [facility], [name]

## 2017-11-04 NOTE — Progress Notes (Signed)
ECHO being done at bedside now. Pt. States CP "gone" at present. No acute distress.

## 2017-11-04 NOTE — Discharge Summary (Signed)
[facility] [place] [facility]   PATIENT NAME: Caitlyn Herrera    MR#:  [id number]  DATE OF BIRTH:  [birth year]  DATE OF ADMISSION:  11/04/2017   ADMITTING PHYSICIAN: Ihor Austin, MD  DATE OF DISCHARGE: No discharge date for patient encounter.  PRIMARY CARE PHYSICIAN: Center, [facility]   ADMISSION DIAGNOSIS:  Unstable angina pectoris (HCC) [I20.0] Elevated troponin [R74.8] DISCHARGE DIAGNOSIS:  Active Problems:   Non-STEMI (non-ST elevated myocardial infarction) (HCC)  SECONDARY DIAGNOSIS:   Past Medical History:  Diagnosis Date  . Hyperlipidemia   . Hypertension    HOSPITAL COURSE:   1. NSTEMI: caridac cath today: Severe, three-vessel coronary artery disease Restart Heparin gtt 2 hours after cath and transfer to [facility] for CABG per Dr. Okey Dupre. 2. HTN: -Lopressor 3. HLD: on Lipitor.   Tobacco abuse.  Smoking cessation was counseled. Discussed with Dr. Okey Dupre. DISCHARGE CONDITIONS:  Guarded, she will be transferred to [facility] when bed is available. CONSULTS OBTAINED:  Treatment Team:  Yvonne Kendall, MD DRUG ALLERGIES:  No Known Allergies DISCHARGE MEDICATIONS:   Allergies as of 11/04/2017   No Known Allergies     Medication List    STOP taking these medications   cephALEXin 500 MG capsule Commonly known as:  KEFLEX     TAKE these medications   amLODipine 5 MG tablet Commonly known as:  NORVASC Take 5 mg by mouth daily.   heparin 100-0.45 UNIT/ML-% infusion Inject 1,000 Units/hr into the vein continuous.   metoprolol tartrate 25 MG tablet Commonly known as:  LOPRESSOR Take 0.5 tablets (12.5 mg total) by mouth 2 (two) times daily.   ondansetron 4 MG disintegrating tablet Commonly known as:  ZOFRAN ODT Take 1 tablet (4 mg total) by mouth every 8 (eight) hours as needed for nausea or vomiting.        DISCHARGE INSTRUCTIONS:  See AVS.  If you experience worsening of your  admission symptoms, develop shortness of breath, life threatening emergency, suicidal or homicidal thoughts you must seek medical attention immediately by calling 911 or calling your MD immediately  if symptoms less severe.  You Must read complete instructions/literature along with all the possible adverse reactions/side effects for all the Medicines you take and that have been prescribed to you. Take any new Medicines after you have completely understood and accpet all the possible adverse reactions/side effects.   Please note  You were cared for by a hospitalist during your hospital stay. If you have any questions about your discharge medications or the care you received while you were in the hospital after you are discharged, you can call the unit and asked to speak with the hospitalist on call if the hospitalist that took care of you is not available. Once you are discharged, your primary care physician will handle any further medical issues. Please note that NO REFILLS for any discharge medications will be authorized once you are discharged, as it is imperative that you return to your primary care physician (or establish a relationship with a primary care physician if you do not have one) for your aftercare needs so that they can reassess your need for medications and monitor your lab values.    On the day of Discharge:  VITAL SIGNS:  Blood pressure (!) 108/59, pulse (!) 52, temperature 98 F (36.7 C), resp. rate 20, height 5\' 6"  (1.676 m), weight 186 lb (84.4 kg), SpO2 100 %. PHYSICAL EXAMINATION:  GENERAL:  57  y.o.-year-old patient lying in the bed with no acute distress.  EYES: Pupils equal, round, reactive to light and accommodation. No scleral icterus. Extraocular muscles intact.  HEENT: Head atraumatic, normocephalic. Oropharynx and nasopharynx clear.  NECK:  Supple, no jugular venous distention. No thyroid enlargement, no tenderness.  LUNGS: Normal breath sounds bilaterally, no  wheezing, rales,rhonchi or crepitation. No use of accessory muscles of respiration.  CARDIOVASCULAR: S1, S2 normal. No murmurs, rubs, or gallops.  ABDOMEN: Soft, non-tender, non-distended. Bowel sounds present. No organomegaly or mass.  EXTREMITIES: No pedal edema, cyanosis, or clubbing.  NEUROLOGIC: Cranial nerves II through XII are intact. Muscle strength 5/5 in all extremities. Sensation intact. Gait not checked.  PSYCHIATRIC: The patient is alert and oriented x 3.  SKIN: No obvious rash, lesion, or ulcer.  DATA REVIEW:   CBC Recent Labs  Lab 11/04/17 0037  WBC 10.5  HGB 14.8  HCT 43.2  PLT 406    Chemistries  Recent Labs  Lab 11/04/17 0413  NA 138  K 4.0  CL 104  CO2 26  GLUCOSE 123*  BUN 12  CREATININE 1.13*  CALCIUM 9.7     Microbiology Results  Results for orders placed or performed during the hospital encounter of 08/12/11  Rapid strep screen     Status: None   Collection Time: 08/12/11  4:10 AM  Result Value Ref Range Status   Streptococcus, Group A Screen (Direct) NEGATIVE NEGATIVE Final    Comment:        DUE TO INADEQUATE SENSITIVITY OF EIA RAPID TESTS FOR GROUP A STREP (GAS) IT IS RECOMMENDED THAT ALL NEGATIVE RESULTS BE FOLLOWED BY A GROUP A STREP PROBE.  Culture, routine-abscess     Status: None   Collection Time: 08/12/11  5:30 AM  Result Value Ref Range Status   Specimen Description ABSCESS  Final   Special Requests PERINEUM  Final   Gram Stain   Final    ABUNDANT WBC PRESENT, PREDOMINANTLY PMN RARE SQUAMOUS EPITHELIAL CELLS PRESENT FEW GRAM POSITIVE RODS FEW GRAM NEGATIVE RODS RARE GRAM POSITIVE COCCI IN PAIRS   Culture   Final    MULTIPLE ORGANISMS PRESENT, NONE PREDOMINANT Note: NO GROUP A STREP (S.PYOGENES) ISOLATED NO STAPHYLOCOCCUS AUREUS ISOLATED   Report Status 08/15/2011 FINAL  Final  Wet prep, genital     Status: Abnormal   Collection Time: 08/12/11  5:50 AM  Result Value Ref Range Status   Yeast Wet Prep HPF POC NONE SEEN  NONE SEEN Final   Trich, Wet Prep TOO NUMEROUS TO COUNT (A) NONE SEEN Final   Clue Cells Wet Prep HPF POC FEW (A) NONE SEEN Final   WBC, Wet Prep HPF POC MODERATE (A) NONE SEEN Final    RADIOLOGY:  Dg Chest 2 View  Result Date: 11/04/2017 CLINICAL DATA:  Left-sided chest pain. EXAM: CHEST  2 VIEW COMPARISON:  None. FINDINGS: The cardiomediastinal contours are normal. Mild biapical pleuroparenchymal scarring. Pulmonary vasculature is normal. No consolidation, pleural effusion, or pneumothorax. No acute osseous abnormalities are seen. IMPRESSION: No acute abnormality. Electronically Signed   By: Rubye OaksMelanie  Ehinger M.D.   On: 11/04/2017 03:12     Management plans discussed with the patient, family and they are in agreement.  CODE STATUS: Full Code   TOTAL TIME TAKING CARE OF THIS PATIENT: 33 minutes.    Shaune PollackQing Caoimhe Damron M.D on 11/04/2017 at 4:41 PM  Between 7am to 6pm - Pager - [phone]  After 6pm go to [email or web address] - password [email or web address]  [facility] [place] [facility]  Office  [phone]  CC: Primary care physician; [name]   Note: This dictation was prepared with Nurse, children's dictation along with smaller phrase technology. Any transcriptional errors that result from this process are unintentional.

## 2017-11-04 NOTE — Progress Notes (Signed)
After 2nd NTG, pt. States CP "easing" to about a "2" on scale 1-10. 12 lead EKG shown to Dr. Okey DupreEnd. Pt. Started with a "7" CP on scale 1-10. Await cath lab.

## 2017-11-04 NOTE — Progress Notes (Signed)
Patient in recovery post heart cath with Dr End, TR band on at this time, denies complaints,vitals stable. Right arm elevated onto pillow. DR End out to speak with daughter regarding findings of cath which pt in need of CABG, [facility]. Questions answered regarding surgery. Sinus rhythm per monitor.

## 2017-11-04 NOTE — ED Provider Notes (Signed)
[facility] Emergency Department Provider Note   ____________________________________________   First MD Initiated Contact with Patient 11/04/17 [phone]     (approximate)  I have reviewed the triage vital signs and the nursing notes.   HISTORY  Chief Complaint Chest Pain    HPI Caitlyn Herrera is a 57 y.o. female who comes into the hospital today with some chest pain.  The patient states that the pain started Sunday night while she was at work.  She reports that the pain hurt all night until this morning.  She states that the more she moved around the more the pain was worse.  The patient took some nausea medicine and Pepto-Bismol but it did not seem to help.  The patient has some mid chest pain but then it aches all across her chest.  She reports that the pain radiates down her bilateral arms.  The patient has no pain in her back.  She has had some shortness of breath with nausea vomiting and lightheadedness.  She also has a mild headache.  She rates her pain a 10 out of 10 in intensity currently.  The patient is here today for evaluation of her chest pain.  Past Medical History:  Diagnosis Date  . Hyperlipidemia   . Hypertension     There are no active problems to display for this patient.   Past Surgical History:  Procedure Laterality Date  . CHOLECYSTECTOMY    . CYST EXCISION     Right breast    Prior to Admission medications   Medication Sig Start Date End Date Taking? Authorizing Provider  amLODipine (NORVASC) 5 MG tablet Take 5 mg by mouth daily.    [provider]  cephALEXin (KEFLEX) 500 MG capsule Take 1 capsule (500 mg total) by mouth 3 (three) times daily. 12/12/16   Irean Hong, MD  ondansetron (ZOFRAN ODT) 4 MG disintegrating tablet Take 1 tablet (4 mg total) by mouth every 8 (eight) hours as needed for nausea or vomiting. 12/12/16   Irean Hong, MD    Allergies Patient has no known allergies.  History reviewed. No  pertinent family history.  Social History Social History   Tobacco Use  . Smoking status: Former Games developer  . Smokeless tobacco: Never Used  Substance Use Topics  . Alcohol use: No  . Drug use: Not on file    Review of Systems  Constitutional: No fever/chills Eyes: No visual changes. ENT: No sore throat. Cardiovascular:  chest pain. Respiratory:  shortness of breath. Gastrointestinal: Nausea and vomiting with no abdominal pain.   No diarrhea.  No constipation. Genitourinary: Negative for dysuria. Musculoskeletal: Negative for back pain. Skin: Negative for rash. Neurological: Lightheadedness and mild headache   ____________________________________________   PHYSICAL EXAM:  VITAL SIGNS: ED Triage Vitals  Enc Vitals Group     BP 11/04/17 0048 (!) 171/90     Pulse Rate 11/04/17 0048 85     Resp 11/04/17 0048 20     Temp 11/04/17 0048 97.9 F (36.6 C)     Temp Source 11/04/17 0048 Oral     SpO2 11/04/17 0048 100 %     Weight 11/04/17 0042 220 lb (99.8 kg)     Height 11/04/17 0153 5\' 6"  (1.676 m)     Head Circumference --      Peak Flow --      Pain Score --      Pain Loc --      Pain Edu? --  Excl. in GC? --     Constitutional: Alert and oriented. Well appearing and in moderate distress. Eyes: Conjunctivae are normal. PERRL. EOMI. Head: Atraumatic. Nose: No congestion/rhinnorhea. Mouth/Throat: Mucous membranes are moist.  Oropharynx non-erythematous. Cardiovascular: Normal rate, regular rhythm. Grossly normal heart sounds.  Good peripheral circulation. Respiratory: Normal respiratory effort.  No retractions. Lungs CTAB. Gastrointestinal: Soft and nontender. No distention.  Musculoskeletal: No lower extremity tenderness nor edema.   Neurologic:  Normal speech and language.  Skin:  Skin is warm, dry and intact. Psychiatric: Mood and affect are normal.  ____________________________________________   LABS (all labs ordered are listed, but only abnormal  results are displayed)  Labs Reviewed  BASIC METABOLIC PANEL - Abnormal; Notable for the following components:      Result Value   Glucose, Bld 129 (*)    Creatinine, Ser 1.17 (*)    GFR calc non Af Amer 50 (*)    GFR calc Af Amer 58 (*)    All other components within normal limits  TROPONIN I - Abnormal; Notable for the following components:   Troponin I 0.29 (*)    All other components within normal limits  CBC  POC URINE PREG, ED   ____________________________________________  EKG  ED ECG REPORT I, Rebecka ApleyWebster,  Kitzia Camus P, the attending physician, personally viewed and interpreted this ECG.   Date: 11/04/2017  EKG Time: 0045  Rate: 79  Rhythm: normal sinus rhythm  Axis: normal  Intervals:none  ST&T Change: ST depression in lead III  ____________________________________________  RADIOLOGY  CXR  ____________________________________________   PROCEDURES  Procedure(s) performed: None  Procedures  Critical Care performed: No  ____________________________________________   INITIAL IMPRESSION / ASSESSMENT AND PLAN / ED COURSE  As part of my medical decision making, I reviewed the following data within the electronic MEDICAL RECORD NUMBER Notes from prior ED visits and [place] Controlled Substance Database  This is a 57 year old female who comes into the hospital today with chest pain shortness of breath nausea vomiting lightheadedness.  Patient has been having this pain for the last 24-36 hours.  My differential diagnosis includes acute coronary syndrome, reflux, musculoskeletal pain, pneumonia.  We did check some blood work and it was found that the patient's troponin was 0.29.  She was in some significant pain so we did give her some nitroglycerin as well as a Nitropaste to her chest.  She also received some aspirin morphine and Zofran.  The pain improved to 6 I will give her some more morphine.  Her chest x-ray does not show any acute cardiopulmonary disease so I will  admit the patient to the hospitalist service.  I will place the patient on a heparin drip and she will be admitted for further evaluation of her acute coronary syndrome.  Clinical Course as of Nov 04 230  Tue Nov 04, 2017  0221 No acute cardiopulmonary disease DG Chest 2 View [AW]    Clinical Course User Index [AW] Rebecka ApleyWebster, Sandhya Denherder P, MD     ____________________________________________   FINAL CLINICAL IMPRESSION(S) / ED DIAGNOSES  Final diagnoses:  Unstable angina pectoris (HCC)  Elevated troponin     ED Discharge Orders    None       Note:  This document was prepared using Dragon voice recognition software and may include unintentional dictation errors.    Rebecka ApleyWebster, Ziyon Cedotal P, MD 11/04/17 [phone]

## 2017-11-04 NOTE — Progress Notes (Signed)
ANTICOAGULATION CONSULT NOTE - Initial Consult  Pharmacy Consult for heparin drip Indication: chest pain/ACS  No Known Allergies  Patient Measurements: Height: 5\' 6"  (167.6 cm) Weight: 186 lb (84.4 kg) IBW/kg (Calculated) : 59.3 Heparin Dosing Weight: 82 kg  Vital Signs: Temp: 98 F (36.7 C) (01/08 1121) Temp Source: Oral (01/08 0727) BP: 104/66 (01/08 1345) Pulse Rate: 49 (01/08 1345)  Labs: Recent Labs    11/04/17 0037 11/04/17 0413 11/04/17 0952  HGB 14.8  --   --   HCT 43.2  --   --   PLT 406  --   --   APTT  --  29  --   LABPROT  --  12.2  --   INR  --  0.91  --   CREATININE 1.17* 1.13*  --   TROPONINI 0.29* 0.94* 1.67*    Estimated Creatinine Clearance: 59.4 mL/min (A) (by C-G formula based on SCr of 1.13 mg/dL (H)).   Medical History: Past Medical History:  Diagnosis Date  . Hyperlipidemia   . Hypertension     Medications:  No anticoagulation in PTA meds.   Assessment: NSTEMI, Now S/P LEFT HEART CATH AND CORONARY ANGIOGRAPHY (N/A)  Goal of Therapy:  Heparin level 0.3-0.7 units/ml Monitor platelets by anticoagulation protocol: Yes   Plan:  Restart heparin infusion 2 hours post sheath removal per Dr. Okey DupreEnd.   Sheath removal documented at 16:01 on 1/8. Restart heparin infusion at  1000 units/hr at 1801. First heparin level 6 hours after start of infusion.  Gardner CandleSheema M Avrum Kimball, PharmD, BCPS Clinical Pharmacist 11/04/2017 4:29 PM

## 2017-11-04 NOTE — Progress Notes (Signed)
Patient without complaints at this time, eating sandwich. Sinus rhythm per monitor. Daughter at bedside. No bleeding nor hematoma at right radial site as air being removed from site. Report called to Shanda BumpsJessica RN with plan reviewed.

## 2017-11-04 NOTE — Consult Note (Signed)
Cardiology Consultation:   Patient ID: CYSTAL SHANNAHAN; [id number]; May 10, 1961   Admit date: 11/04/2017 Date of Consult: 11/04/2017  Primary Care Provider: [name] Primary Cardiologist: New to [facility] - consult by End   Patient Profile:   Caitlyn Herrera is a 57 y.o. female with a hx of HTN, HLD, and ongoing tobacco abuse who is being seen today for the evaluation of elevated troponin at the request of Dr. Tobi Bastos.  History of Present Illness:   Caitlyn Herrera has no previously known cardiac history. Over the past 2 weeks she has noted worsening exertional SOB. This has been newly associated with recent onset of exertional chest pain that was first noticed while at work on 1/6. 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 There has been associated nausea, vomiting, and SOB. Pain radiates to her bilateral arms. Pain is characterized as sharp and dull. On occasion, the pain has worsened with cough, though it is more so associated with exertion. Upon the patient's arrival to [facility] they were found to have elevated BP that has since improved. EKG showed NSR, 79 bpm, nonspecific inferolateral st depression, CXR showed no acute process. Labs showed troponin 0.29-->0.94, SCr 1.17-->1.13, K+ 4.3, glucose 129, LDL 172, cbc unremarkable. She was given ASA, morphine, SL NTG, nitropaste, and Zofran. She was started on heparin gtt. Chest pain has resolved as of this morning. Continues to note nausea.   Past Medical History:  Diagnosis Date  . Hyperlipidemia   . Hypertension     Past Surgical History:  Procedure Laterality Date  . CHOLECYSTECTOMY    . CYST EXCISION     Right breast     Home Meds: Prior to Admission medications   Medication Sig Start Date End Date Taking? Authorizing Provider  ondansetron (ZOFRAN ODT) 4 MG disintegrating tablet Take 1 tablet (4  mg total) by mouth every 8 (eight) hours as needed for nausea or vomiting. 12/12/16  Yes Irean Hong, MD  amLODipine (NORVASC) 5 MG tablet Take 5 mg by mouth daily.    [provider]  cephALEXin (KEFLEX) 500 MG capsule Take 1 capsule (500 mg total) by mouth 3 (three) times daily. Patient not taking: Reported on 11/04/2017 12/12/16   Irean Hong, MD    Inpatient Medications: Scheduled Meds: . acetaminophen      . aspirin  324 mg Oral NOW   Or  . aspirin  300 mg Rectal NOW  . [START ON 11/05/2017] aspirin EC  81 mg Oral Daily  . atorvastatin  40 mg Oral q1800  . metoprolol tartrate  25 mg Oral BID  . morphine      . nitroGLYCERIN      . nitroGLYCERIN      . sodium chloride flush  3 mL Intravenous Q12H   Continuous Infusions: . sodium chloride    . [START ON 11/05/2017] sodium chloride     Followed by  . [START ON 11/05/2017] sodium chloride    . dextrose 5 % and 0.9% NaCl 75 mL/hr at 11/04/17 0603  . heparin 1,000 Units/hr (11/04/17 0603)   PRN Meds: sodium chloride, acetaminophen, morphine injection, nitroGLYCERIN, ondansetron (ZOFRAN) IV, sodium chloride flush  Allergies:  No Known Allergies  Social History:   Social History   Socioeconomic History  . Marital status: Widowed    Spouse name: Not on file  . Number  of children: Not on file  . Years of education: Not on file  . Highest education level: Not on file  Social Needs  . Financial resource strain: Not on file  . Food insecurity - worry: Not on file  . Food insecurity - inability: Not on file  . Transportation needs - medical: Not on file  . Transportation needs - non-medical: Not on file  Occupational History    Employer: [employer]  Tobacco Use  . Smoking status: Former Games developermoker  . Smokeless tobacco: Never Used  Substance and Sexual Activity  . Alcohol use: No  . Drug use: Not on file  . Sexual activity: Yes  Other Topics Concern  . Not on file  Social History Narrative  . Not on file      Family History:   Family History  Problem Relation Age of Onset  . Hypertension Mother     ROS:  Review of Systems  Constitutional: Positive for malaise/fatigue. Negative for chills, diaphoresis, fever and weight loss.  HENT: Negative for congestion.   Eyes: Negative for discharge and redness.  Respiratory: Positive for shortness of breath. Negative for cough, hemoptysis, sputum production and wheezing.   Cardiovascular: Positive for chest pain. Negative for palpitations, orthopnea, claudication, leg swelling and PND.  Gastrointestinal: Positive for nausea and vomiting. Negative for abdominal pain, blood in stool, heartburn and melena.  Genitourinary: Negative for hematuria.  Musculoskeletal: Negative for falls and myalgias.  Skin: Negative for rash.  Neurological: Positive for weakness. Negative for dizziness, tingling, tremors, sensory change, speech change, focal weakness and loss of consciousness.  Endo/Heme/Allergies: Does not bruise/bleed easily.  Psychiatric/Behavioral: Negative for substance abuse. The patient is not nervous/anxious.   All other systems reviewed and are negative.     Physical Exam/Data:   Vitals:   11/04/17 0315 11/04/17 0326 11/04/17 0353 11/04/17 0727  BP: (!) 144/76  (!) 173/64 (!) 130/52  Pulse: (!) 54 (!) 57 (!) 53 (!) 55  Resp: 14 14 20 16   Temp:   (!) 97.3 F (36.3 C) 97.7 F (36.5 C)  TempSrc:   Oral Oral  SpO2: 98% 96% 98% 100%  Weight:   186 lb 6.4 oz (84.6 kg)   Height:   5\' 6"  (1.676 m)    No intake or output data in the 24 hours ending 11/04/17 0836 Filed Weights   11/04/17 0042 11/04/17 0353  Weight: 220 lb (99.8 kg) 186 lb 6.4 oz (84.6 kg)   Body mass index is 30.09 kg/m.   Physical Exam: General: Well developed, well nourished, in no acute distress. Head: Normocephalic, atraumatic, sclera non-icteric, no xanthomas, nares without discharge.  Neck: Negative for carotid bruits. JVD not elevated. Lungs: Clear bilaterally to  auscultation without wheezes, rales, or rhonchi. Breathing is unlabored. Heart: RRR with S1 S2. No murmurs, rubs, or gallops appreciated. Abdomen: Soft, non-tender, non-distended with normoactive bowel sounds. No hepatomegaly. No rebound/guarding. No obvious abdominal masses. Msk:  Strength and tone appear normal for age. Extremities: No clubbing or cyanosis. No edema. Distal pedal pulses are 2+ and equal bilaterally. Neuro: Alert and oriented X 3. No facial asymmetry. No focal deficit. Moves all extremities spontaneously. Psych:  Responds to questions appropriately with a normal affect.   EKG:  The EKG was personally reviewed and demonstrates: NSR, 79 bpm, nonspecific inferolateral st/t changes Telemetry:  Telemetry was personally reviewed and demonstrates: NSR  Weights: Filed Weights   11/04/17 0042 11/04/17 0353  Weight: 220 lb (99.8 kg) 186 lb 6.4  oz (84.6 kg)    Relevant CV Studies: none  Laboratory Data:  Chemistry Recent Labs  Lab 11/04/17 0037 11/04/17 0413  NA 137 138  K 4.2 4.0  CL 101 104  CO2 26 26  GLUCOSE 129* 123*  BUN 11 12  CREATININE 1.17* 1.13*  CALCIUM 10.1 9.7  GFRNONAA 50* 53*  GFRAA 58* >60  ANIONGAP 10 8    No results for input(s): PROT, ALBUMIN, AST, ALT, ALKPHOS, BILITOT in the last 168 hours. Hematology Recent Labs  Lab 11/04/17 0037  WBC 10.5  RBC 4.62  HGB 14.8  HCT 43.2  MCV 93.7  MCH 32.1  MCHC 34.3  RDW 13.1  PLT 406   Cardiac Enzymes Recent Labs  Lab 11/04/17 0037 11/04/17 0413  TROPONINI 0.29* 0.94*   No results for input(s): TROPIPOC in the last 168 hours.  BNPNo results for input(s): BNP, PROBNP in the last 168 hours.  DDimer No results for input(s): DDIMER in the last 168 hours.  Radiology/Studies:  Dg Chest 2 View  Result Date: 11/04/2017 IMPRESSION: No acute abnormality. Electronically Signed   By: Rubye Oaks M.D.   On: 11/04/2017 03:12    Assessment and Plan:   1. NSTEMI: -Currently, chest pain  free -Troponin has trended to 0.9 as of this morning, continue to cycle until peak -ASA -Heparin gtt -Schedule LHC this AM with Dr. Okey Dupre -Echo pending -Lopressor -Lipitor -SL NTG prn -Risks and benefits of cardiac catheterization have been discussed with the patient including risks of bleeding, bruising, infection, kidney damage, stroke, heart attack, and death. The patient understands these risks and is willing to proceed with the procedure. All questions have been answered and concerns listened to  2. HTN: -Improving -Lopressor as above -Titrate as needed per IM  3. HLD: -Lipitor -If LDL not at goal at outpatient follow up consider Zetia vs PCSK9 inhibitor  4. Hyperglycemia: -Check A1c  5. Tobacco abuse: -Cessation advised -Defer conversation to IM   For questions or updates, please contact [facility] Please consult [email or web address] for contact info under Cardiology/STEMI.   Signed, Eula Listen, PA-C [facility] Pager: [phone] 11/04/2017, 8:36 AM

## 2017-11-04 NOTE — Plan of Care (Signed)
  Progressing Education: Knowledge of General Education information will improve 11/04/2017 0654 - Progressing by Dorna LeitzNesbitt, Legrand Lasser M, RN Education: Understanding of cardiac disease, CV risk reduction, and recovery process will improve 11/04/2017 0654 - Progressing by Dorna LeitzNesbitt, Tajae Maiolo M, RN Cardiac: Ability to achieve and maintain adequate cardiovascular perfusion will improve 11/04/2017 0654 - Progressing by Dorna LeitzNesbitt, Nealie Mchatton M, RN

## 2017-11-04 NOTE — ED Notes (Signed)
Pain 10/10 before nitro and morphine. Pain is now 6/10.

## 2017-11-04 NOTE — Progress Notes (Signed)
*  PRELIMINARY RESULTS* Echocardiogram 2D Echocardiogram has been performed.  Caitlyn Herrera 11/04/2017, 2:49 PM 

## 2017-11-04 NOTE — ED Triage Notes (Signed)
Pt c/o left sided chest pain that radiates into left arm, started on Monday evening. Pt to ED this AM due to pain increasing. Pt denies N/V.

## 2017-11-04 NOTE — Progress Notes (Signed)
ANTICOAGULATION CONSULT NOTE - Initial Consult  Pharmacy Consult for heparin drip Indication: chest pain/ACS  No Known Allergies  Patient Measurements: Height: 5\' 6"  (167.6 cm) Weight: 220 lb (99.8 kg) IBW/kg (Calculated) : 59.3 Heparin Dosing Weight: 82 kg  Vital Signs: Temp: 97.3 F (36.3 C) (01/08 0353) Temp Source: Oral (01/08 0353) BP: 173/64 (01/08 0353) Pulse Rate: 53 (01/08 0353)  Labs: Recent Labs    11/04/17 0037  HGB 14.8  HCT 43.2  PLT 406  CREATININE 1.17*  TROPONINI 0.29*    Estimated Creatinine Clearance: 62.5 mL/min (A) (by C-G formula based on SCr of 1.17 mg/dL (H)).   Medical History: Past Medical History:  Diagnosis Date  . Hyperlipidemia   . Hypertension     Medications:  No anticoagulation in PTA meds  Assessment: Trop 0.29  Goal of Therapy:  Heparin level 0.3-0.7 units/ml Monitor platelets by anticoagulation protocol: Yes   Plan:  4000 unit bolus and initial rate of 1000 units/hr. First heparin level 6 hours after start of infusion.  Jochebed Bills S 11/04/2017,4:14 AM

## 2017-11-04 NOTE — Progress Notes (Signed)
Patient has no complaints of chest pain. Vital sign is reviewed. Physical examination is unremarkable. Non-STEMI, hypertension and hyperlipidemia. The patient is on heparin drip and will get cardiac cath today. Continue current treatment.  Follow-up cardiologist further recommendation.  Tobacco abuse.  Smoking cessation was counseled for 4 minutes. Discussed with the patient, her family members, RN and cardiologist PA Mr. Shea EvansDunn.  Time spent 35 minutes.

## 2017-11-04 NOTE — H&P (View-Only) (Signed)
   Cardiology Consultation:   Patient ID: Caitlyn Herrera; [id number]; 07/18/1961   Admit date: 11/04/2017 Date of Consult: 11/04/2017  Primary Care Provider: [facility] Primary Cardiologist: New to [facility] - consult by End   Patient Profile:   Caitlyn Herrera is a 56 y.o. female with a hx of HTN, HLD, and ongoing tobacco abuse who is being seen today for the evaluation of elevated troponin at the request of Dr. Pyreddy.  History of Present Illness:   Caitlyn Herrera has no previously known cardiac history. Over the past 2 weeks she has noted worsening exertional SOB. This has been newly associated with recent onset of exertional chest pain that was first noticed while at work on 1/6. 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 There has been associated nausea, vomiting, and SOB. Pain radiates to her bilateral arms. Pain is characterized as sharp and dull. On occasion, the pain has worsened with cough, though it is more so associated with exertion. Upon the patient's arrival to [facility] they were found to have elevated BP that has since improved. EKG showed NSR, 79 bpm, nonspecific inferolateral st depression, CXR showed no acute process. Labs showed troponin 0.29-->0.94, SCr 1.17-->1.13, K+ 4.3, glucose 129, LDL 172, cbc unremarkable. She was given ASA, morphine, SL NTG, nitropaste, and Zofran. She was started on heparin gtt. Chest pain has resolved as of this morning. Continues to note nausea.   Past Medical History:  Diagnosis Date  . Hyperlipidemia   . Hypertension     Past Surgical History:  Procedure Laterality Date  . CHOLECYSTECTOMY    . CYST EXCISION     Right breast     Home Meds: Prior to Admission medications   Medication Sig Start Date End Date Taking? Authorizing Provider  ondansetron (ZOFRAN ODT) 4 MG disintegrating tablet Take 1 tablet (4  mg total) by mouth every 8 (eight) hours as needed for nausea or vomiting. 12/12/16  Yes Sung, Jade J, MD  amLODipine (NORVASC) 5 MG tablet Take 5 mg by mouth daily.    [provider]  cephALEXin (KEFLEX) 500 MG capsule Take 1 capsule (500 mg total) by mouth 3 (three) times daily. Patient not taking: Reported on 11/04/2017 12/12/16   Sung, Jade J, MD    Inpatient Medications: Scheduled Meds: . acetaminophen      . aspirin  324 mg Oral NOW   Or  . aspirin  300 mg Rectal NOW  . [START ON 11/05/2017] aspirin EC  81 mg Oral Daily  . atorvastatin  40 mg Oral q1800  . metoprolol tartrate  25 mg Oral BID  . morphine      . nitroGLYCERIN      . nitroGLYCERIN      . sodium chloride flush  3 mL Intravenous Q12H   Continuous Infusions: . sodium chloride    . [START ON 11/05/2017] sodium chloride     Followed by  . [START ON 11/05/2017] sodium chloride    . dextrose 5 % and 0.9% NaCl 75 mL/hr at 11/04/17 0603  . heparin 1,000 Units/hr (11/04/17 0603)   PRN Meds: sodium chloride, acetaminophen, morphine injection, nitroGLYCERIN, ondansetron (ZOFRAN) IV, sodium chloride flush  Allergies:  No Known Allergies  Social History:   Social History   Socioeconomic History  . Marital status: Widowed    Spouse name: Not on file  . Number   of children: Not on file  . Years of education: Not on file  . Highest education level: Not on file  Social Needs  . Financial resource strain: Not on file  . Food insecurity - worry: Not on file  . Food insecurity - inability: Not on file  . Transportation needs - medical: Not on file  . Transportation needs - non-medical: Not on file  Occupational History    Employer: [employer]  Tobacco Use  . Smoking status: Former Smoker  . Smokeless tobacco: Never Used  Substance and Sexual Activity  . Alcohol use: No  . Drug use: Not on file  . Sexual activity: Yes  Other Topics Concern  . Not on file  Social History Narrative  . Not on file      Family History:   Family History  Problem Relation Age of Onset  . Hypertension Mother     ROS:  Review of Systems  Constitutional: Positive for malaise/fatigue. Negative for chills, diaphoresis, fever and weight loss.  HENT: Negative for congestion.   Eyes: Negative for discharge and redness.  Respiratory: Positive for shortness of breath. Negative for cough, hemoptysis, sputum production and wheezing.   Cardiovascular: Positive for chest pain. Negative for palpitations, orthopnea, claudication, leg swelling and PND.  Gastrointestinal: Positive for nausea and vomiting. Negative for abdominal pain, blood in stool, heartburn and melena.  Genitourinary: Negative for hematuria.  Musculoskeletal: Negative for falls and myalgias.  Skin: Negative for rash.  Neurological: Positive for weakness. Negative for dizziness, tingling, tremors, sensory change, speech change, focal weakness and loss of consciousness.  Endo/Heme/Allergies: Does not bruise/bleed easily.  Psychiatric/Behavioral: Negative for substance abuse. The patient is not nervous/anxious.   All other systems reviewed and are negative.     Physical Exam/Data:   Vitals:   11/04/17 0315 11/04/17 0326 11/04/17 0353 11/04/17 0727  BP: (!) 144/76  (!) 173/64 (!) 130/52  Pulse: (!) 54 (!) 57 (!) 53 (!) 55  Resp: 14 14 20 16  Temp:   (!) 97.3 F (36.3 C) 97.7 F (36.5 C)  TempSrc:   Oral Oral  SpO2: 98% 96% 98% 100%  Weight:   186 lb 6.4 oz (84.6 kg)   Height:   5' 6" (1.676 m)    No intake or output data in the 24 hours ending 11/04/17 0836 Filed Weights   11/04/17 0042 11/04/17 0353  Weight: 220 lb (99.8 kg) 186 lb 6.4 oz (84.6 kg)   Body mass index is 30.09 kg/m.   Physical Exam: General: Well developed, well nourished, in no acute distress. Head: Normocephalic, atraumatic, sclera non-icteric, no xanthomas, nares without discharge.  Neck: Negative for carotid bruits. JVD not elevated. Lungs: Clear bilaterally to  auscultation without wheezes, rales, or rhonchi. Breathing is unlabored. Heart: RRR with S1 S2. No murmurs, rubs, or gallops appreciated. Abdomen: Soft, non-tender, non-distended with normoactive bowel sounds. No hepatomegaly. No rebound/guarding. No obvious abdominal masses. Msk:  Strength and tone appear normal for age. Extremities: No clubbing or cyanosis. No edema. Distal pedal pulses are 2+ and equal bilaterally. Neuro: Alert and oriented X 3. No facial asymmetry. No focal deficit. Moves all extremities spontaneously. Psych:  Responds to questions appropriately with a normal affect.   EKG:  The EKG was personally reviewed and demonstrates: NSR, 79 bpm, nonspecific inferolateral st/t changes Telemetry:  Telemetry was personally reviewed and demonstrates: NSR  Weights: Filed Weights   11/04/17 0042 11/04/17 0353  Weight: 220 lb (99.8 kg) 186 lb 6.4   oz (84.6 kg)    Relevant CV Studies: none  Laboratory Data:  Chemistry Recent Labs  Lab 11/04/17 0037 11/04/17 0413  NA 137 138  K 4.2 4.0  CL 101 104  CO2 26 26  GLUCOSE 129* 123*  BUN 11 12  CREATININE 1.17* 1.13*  CALCIUM 10.1 9.7  GFRNONAA 50* 53*  GFRAA 58* >60  ANIONGAP 10 8    No results for input(s): PROT, ALBUMIN, AST, ALT, ALKPHOS, BILITOT in the last 168 hours. Hematology Recent Labs  Lab 11/04/17 0037  WBC 10.5  RBC 4.62  HGB 14.8  HCT 43.2  MCV 93.7  MCH 32.1  MCHC 34.3  RDW 13.1  PLT 406   Cardiac Enzymes Recent Labs  Lab 11/04/17 0037 11/04/17 0413  TROPONINI 0.29* 0.94*   No results for input(s): TROPIPOC in the last 168 hours.  BNPNo results for input(s): BNP, PROBNP in the last 168 hours.  DDimer No results for input(s): DDIMER in the last 168 hours.  Radiology/Studies:  Dg Chest 2 View  Result Date: 11/04/2017 IMPRESSION: No acute abnormality. Electronically Signed   By: Melanie  Ehinger M.D.   On: 11/04/2017 03:12    Assessment and Plan:   1. NSTEMI: -Currently, chest pain  free -Troponin has trended to 0.9 as of this morning, continue to cycle until peak -ASA -Heparin gtt -Schedule LHC this AM with Dr. End -Echo pending -Lopressor -Lipitor -SL NTG prn -Risks and benefits of cardiac catheterization have been discussed with the patient including risks of bleeding, bruising, infection, kidney damage, stroke, heart attack, and death. The patient understands these risks and is willing to proceed with the procedure. All questions have been answered and concerns listened to  2. HTN: -Improving -Lopressor as above -Titrate as needed per IM  3. HLD: -Lipitor -If LDL not at goal at outpatient follow up consider Zetia vs PCSK9 inhibitor  4. Hyperglycemia: -Check A1c  5. Tobacco abuse: -Cessation advised -Defer conversation to IM   For questions or updates, please contact [facility] Please consult [email or web address] for contact info under Cardiology/STEMI.   Signed, Kaicee Scarpino, PA-C [facility] Pager: [phone] 11/04/2017, 8:36 AM    

## 2017-11-05 ENCOUNTER — Other Ambulatory Visit: Payer: Self-pay | Admitting: *Deleted

## 2017-11-05 ENCOUNTER — Inpatient Hospital Stay (HOSPITAL_COMMUNITY)
Admission: AD | Admit: 2017-11-05 | Discharge: 2017-11-17 | DRG: 236 | Disposition: A | Discharge status: Home Health | Payer: BLUE CROSS/BLUE SHIELD | Source: Other Acute Inpatient Hospital | Attending: Cardiothoracic Surgery | Admitting: Cardiothoracic Surgery

## 2017-11-05 ENCOUNTER — Encounter: Payer: Self-pay | Admitting: Internal Medicine

## 2017-11-05 ENCOUNTER — Other Ambulatory Visit: Payer: Self-pay

## 2017-11-05 DIAGNOSIS — Z79899 Other long term (current) drug therapy: Secondary | ICD-10-CM | POA: Diagnosis not present

## 2017-11-05 DIAGNOSIS — I214 Non-ST elevation (NSTEMI) myocardial infarction: Principal | ICD-10-CM | POA: Diagnosis present

## 2017-11-05 DIAGNOSIS — E669 Obesity, unspecified: Secondary | ICD-10-CM | POA: Diagnosis present

## 2017-11-05 DIAGNOSIS — Z09 Encounter for follow-up examination after completed treatment for conditions other than malignant neoplasm: Secondary | ICD-10-CM

## 2017-11-05 DIAGNOSIS — I2510 Atherosclerotic heart disease of native coronary artery without angina pectoris: Secondary | ICD-10-CM

## 2017-11-05 DIAGNOSIS — I10 Essential (primary) hypertension: Secondary | ICD-10-CM

## 2017-11-05 DIAGNOSIS — Z6830 Body mass index (BMI) 30.0-30.9, adult: Secondary | ICD-10-CM

## 2017-11-05 DIAGNOSIS — E1151 Type 2 diabetes mellitus with diabetic peripheral angiopathy without gangrene: Secondary | ICD-10-CM | POA: Diagnosis present

## 2017-11-05 DIAGNOSIS — Z8249 Family history of ischemic heart disease and other diseases of the circulatory system: Secondary | ICD-10-CM | POA: Diagnosis not present

## 2017-11-05 DIAGNOSIS — M1611 Unilateral primary osteoarthritis, right hip: Secondary | ICD-10-CM | POA: Diagnosis present

## 2017-11-05 DIAGNOSIS — E7800 Pure hypercholesterolemia, unspecified: Secondary | ICD-10-CM | POA: Diagnosis not present

## 2017-11-05 DIAGNOSIS — R911 Solitary pulmonary nodule: Secondary | ICD-10-CM | POA: Diagnosis present

## 2017-11-05 DIAGNOSIS — I442 Atrioventricular block, complete: Secondary | ICD-10-CM | POA: Diagnosis not present

## 2017-11-05 DIAGNOSIS — E785 Hyperlipidemia, unspecified: Secondary | ICD-10-CM | POA: Diagnosis present

## 2017-11-05 DIAGNOSIS — I25110 Atherosclerotic heart disease of native coronary artery with unstable angina pectoris: Secondary | ICD-10-CM

## 2017-11-05 DIAGNOSIS — Z01810 Encounter for preprocedural cardiovascular examination: Secondary | ICD-10-CM | POA: Diagnosis not present

## 2017-11-05 DIAGNOSIS — Z720 Tobacco use: Secondary | ICD-10-CM | POA: Diagnosis not present

## 2017-11-05 DIAGNOSIS — F17200 Nicotine dependence, unspecified, uncomplicated: Secondary | ICD-10-CM | POA: Diagnosis present

## 2017-11-05 DIAGNOSIS — E1169 Type 2 diabetes mellitus with other specified complication: Secondary | ICD-10-CM | POA: Diagnosis present

## 2017-11-05 DIAGNOSIS — E8770 Fluid overload, unspecified: Secondary | ICD-10-CM | POA: Diagnosis not present

## 2017-11-05 DIAGNOSIS — J9811 Atelectasis: Secondary | ICD-10-CM | POA: Diagnosis not present

## 2017-11-05 DIAGNOSIS — I119 Hypertensive heart disease without heart failure: Secondary | ICD-10-CM | POA: Diagnosis present

## 2017-11-05 DIAGNOSIS — N289 Disorder of kidney and ureter, unspecified: Secondary | ICD-10-CM | POA: Diagnosis present

## 2017-11-05 DIAGNOSIS — N390 Urinary tract infection, site not specified: Secondary | ICD-10-CM | POA: Diagnosis present

## 2017-11-05 DIAGNOSIS — D62 Acute posthemorrhagic anemia: Secondary | ICD-10-CM | POA: Diagnosis not present

## 2017-11-05 DIAGNOSIS — Z951 Presence of aortocoronary bypass graft: Secondary | ICD-10-CM

## 2017-11-05 DIAGNOSIS — J449 Chronic obstructive pulmonary disease, unspecified: Secondary | ICD-10-CM | POA: Diagnosis present

## 2017-11-05 DIAGNOSIS — Z794 Long term (current) use of insulin: Secondary | ICD-10-CM

## 2017-11-05 DIAGNOSIS — E1159 Type 2 diabetes mellitus with other circulatory complications: Secondary | ICD-10-CM | POA: Diagnosis not present

## 2017-11-05 DIAGNOSIS — E119 Type 2 diabetes mellitus without complications: Secondary | ICD-10-CM

## 2017-11-05 DIAGNOSIS — R0602 Shortness of breath: Secondary | ICD-10-CM

## 2017-11-05 DIAGNOSIS — I493 Ventricular premature depolarization: Secondary | ICD-10-CM | POA: Diagnosis not present

## 2017-11-05 HISTORY — DX: Atherosclerotic heart disease of native coronary artery without angina pectoris: I25.10

## 2017-11-05 LAB — CBC
HEMATOCRIT: 40 % (ref 35.0–47.0)
HEMOGLOBIN: 13.4 g/dL (ref 12.0–16.0)
MCH: 31.7 pg (ref 26.0–34.0)
MCHC: 33.5 g/dL (ref 32.0–36.0)
MCV: 94.5 fL (ref 80.0–100.0)
Platelets: 330 10*3/uL (ref 150–440)
RBC: 4.23 MIL/uL (ref 3.80–5.20)
RDW: 12.9 % (ref 11.5–14.5)
WBC: 10.1 10*3/uL (ref 3.6–11.0)

## 2017-11-05 LAB — HEPARIN LEVEL (UNFRACTIONATED)
HEPARIN UNFRACTIONATED: 0.2 [IU]/mL — AB (ref 0.30–0.70)
HEPARIN UNFRACTIONATED: 0.39 [IU]/mL (ref 0.30–0.70)

## 2017-11-05 LAB — HIV ANTIBODY (ROUTINE TESTING W REFLEX): HIV Screen 4th Generation wRfx: NONREACTIVE

## 2017-11-05 LAB — GLUCOSE, CAPILLARY
GLUCOSE-CAPILLARY: 126 mg/dL — AB (ref 65–99)
Glucose-Capillary: 172 mg/dL — ABNORMAL HIGH (ref 65–99)

## 2017-11-05 MED ORDER — ASPIRIN EC 81 MG PO TBEC
81.0000 mg | DELAYED_RELEASE_TABLET | Freq: Every day | ORAL | Status: DC
  Administered 2017-11-06 – 2017-11-11 (×6): 81 mg via ORAL
  Filled 2017-11-05 (×6): qty 1

## 2017-11-05 MED ORDER — ACETAMINOPHEN 325 MG PO TABS
650.0000 mg | ORAL_TABLET | ORAL | Status: DC | PRN
  Administered 2017-11-05 – 2017-11-10 (×11): 650 mg via ORAL
  Filled 2017-11-05 (×12): qty 2

## 2017-11-05 MED ORDER — METOPROLOL TARTRATE 12.5 MG HALF TABLET
12.5000 mg | ORAL_TABLET | Freq: Two times a day (BID) | ORAL | Status: DC
  Administered 2017-11-05 – 2017-11-06 (×2): 12.5 mg via ORAL
  Filled 2017-11-05 (×2): qty 1

## 2017-11-05 MED ORDER — NITROGLYCERIN 0.4 MG SL SUBL
0.4000 mg | SUBLINGUAL_TABLET | SUBLINGUAL | Status: DC | PRN
  Administered 2017-11-06 (×4): 0.4 mg via SUBLINGUAL
  Filled 2017-11-05 (×5): qty 1

## 2017-11-05 MED ORDER — ATORVASTATIN CALCIUM 80 MG PO TABS
80.0000 mg | ORAL_TABLET | Freq: Every day | ORAL | Status: DC
Start: 2017-11-05 — End: 2017-11-05
  Filled 2017-11-05: qty 1

## 2017-11-05 MED ORDER — ONDANSETRON HCL 4 MG/2ML IJ SOLN
4.0000 mg | Freq: Four times a day (QID) | INTRAMUSCULAR | Status: DC | PRN
  Administered 2017-11-05 – 2017-11-06 (×2): 4 mg via INTRAVENOUS
  Filled 2017-11-05 (×2): qty 2

## 2017-11-05 MED ORDER — ATORVASTATIN CALCIUM 40 MG PO TABS
40.0000 mg | ORAL_TABLET | Freq: Every day | ORAL | Status: DC
  Administered 2017-11-05 – 2017-11-06 (×2): 40 mg via ORAL
  Filled 2017-11-05 (×2): qty 1

## 2017-11-05 MED ORDER — POLYETHYLENE GLYCOL 3350 17 G PO PACK
17.0000 g | PACK | Freq: Every day | ORAL | Status: DC | PRN
  Administered 2017-11-05 – 2017-11-06 (×2): 17 g via ORAL
  Filled 2017-11-05 (×2): qty 1

## 2017-11-05 MED ORDER — HEPARIN (PORCINE) IN NACL 100-0.45 UNIT/ML-% IJ SOLN
1250.0000 [IU]/h | INTRAMUSCULAR | Status: DC
  Administered 2017-11-06: 1150 [IU]/h via INTRAVENOUS
  Administered 2017-11-07: 1050 [IU]/h via INTRAVENOUS
  Administered 2017-11-08 – 2017-11-11 (×4): 1250 [IU]/h via INTRAVENOUS
  Filled 2017-11-05 (×8): qty 250

## 2017-11-05 MED ORDER — MOMETASONE FURO-FORMOTEROL FUM 200-5 MCG/ACT IN AERO
2.0000 | INHALATION_SPRAY | Freq: Two times a day (BID) | RESPIRATORY_TRACT | Status: DC
  Administered 2017-11-06 – 2017-11-11 (×11): 2 via RESPIRATORY_TRACT
  Filled 2017-11-05: qty 8.8

## 2017-11-05 MED ORDER — LEVALBUTEROL HCL 1.25 MG/0.5ML IN NEBU
1.2500 mg | INHALATION_SOLUTION | Freq: Three times a day (TID) | RESPIRATORY_TRACT | Status: DC
  Administered 2017-11-05 – 2017-11-10 (×13): 1.25 mg via RESPIRATORY_TRACT
  Filled 2017-11-05 (×14): qty 0.5

## 2017-11-05 MED ORDER — INSULIN ASPART 100 UNIT/ML ~~LOC~~ SOLN: 0.0000 [IU] | Freq: Every day | SUBCUTANEOUS | Status: DC

## 2017-11-05 MED ORDER — INSULIN ASPART 100 UNIT/ML ~~LOC~~ SOLN: 0.0000 [IU] | Freq: Three times a day (TID) | SUBCUTANEOUS | Status: DC

## 2017-11-05 MED ORDER — HEPARIN BOLUS VIA INFUSION
1200.0000 [IU] | Freq: Once | INTRAVENOUS | Status: AC
  Administered 2017-11-05: 1200 [IU] via INTRAVENOUS
  Filled 2017-11-05: qty 1200

## 2017-11-05 MED ORDER — POLYETHYLENE GLYCOL 3350 17 G PO PACK: 17.0000 g | PACK | Freq: Every day | ORAL | Status: DC | PRN

## 2017-11-05 MED ORDER — GUAIFENESIN ER 600 MG PO TB12
600.0000 mg | ORAL_TABLET | Freq: Two times a day (BID) | ORAL | Status: DC
  Administered 2017-11-05 – 2017-11-11 (×13): 600 mg via ORAL
  Filled 2017-11-05 (×13): qty 1

## 2017-11-05 MED ORDER — ISOSORBIDE MONONITRATE ER 30 MG PO TB24
30.0000 mg | ORAL_TABLET | Freq: Every day | ORAL | Status: DC
  Administered 2017-11-06: 30 mg via ORAL
  Filled 2017-11-05: qty 1

## 2017-11-05 MED ORDER — ISOSORBIDE MONONITRATE ER 30 MG PO TB24: 30.0000 mg | ORAL_TABLET | Freq: Every day | ORAL | Status: DC

## 2017-11-05 MED ORDER — ATORVASTATIN CALCIUM 80 MG PO TABS: 80.0000 mg | ORAL_TABLET | Freq: Every day | ORAL | Status: DC

## 2017-11-05 MED ORDER — ISOSORBIDE MONONITRATE ER 30 MG PO TB24
30.0000 mg | ORAL_TABLET | Freq: Every day | ORAL | Status: DC
  Administered 2017-11-05: 30 mg via ORAL
  Filled 2017-11-05: qty 1

## 2017-11-05 NOTE — Progress Notes (Signed)
ANTICOAGULATION CONSULT NOTE - Initial Consult  Pharmacy Consult for heparin drip Indication: chest pain/ACS  No Known Allergies  Patient Measurements: Height: 5\' 6"  (167.6 cm) Weight: 186 lb (84.4 kg) IBW/kg (Calculated) : 59.3 Heparin Dosing Weight: 82 kg  Vital Signs: Temp: 98.2 F (36.8 C) (01/09 0523) Temp Source: Oral (01/09 0523) BP: 162/89 (01/09 0828) Pulse Rate: 72 (01/09 0828)  Labs: Recent Labs    11/04/17 0037 11/04/17 0413 11/04/17 0952 11/04/17 1802 11/05/17 0135 11/05/17 1128  HGB 14.8  --   --   --   --  13.4  HCT 43.2  --   --   --   --  40.0  PLT 406  --   --   --   --  330  APTT  --  29  --   --   --   --   LABPROT  --  12.2  --   --   --   --   INR  --  0.91  --   --   --   --   HEPARINUNFRC  --   --   --   --  0.20* 0.39  CREATININE 1.17* 1.13*  --   --   --   --   TROPONINI 0.29* 0.94* 1.67* 1.91*  --   --     Estimated Creatinine Clearance: 59.4 mL/min (A) (by C-G formula based on SCr of 1.13 mg/dL (H)).   Medical History: Past Medical History:  Diagnosis Date  . CAD (coronary artery disease)    a. NSTEMI 11/04/17; b. LHC dLM 30%, mLAD 90%, p-mLCx 80%, OM1 99%, OM2 30%, pRCA 80% mRCA 60%, dRCA 90%  . Diabetes mellitus (HCC)   . Hyperlipidemia   . Hypertension   . NSTEMI (non-ST elevated myocardial infarction) (HCC) 11/04/2017   a. TTE 11/04/2017: EF of 65-70%, no RWMA, normal LV diastolic function, normal RV cavity size and systolic function, mild TR    Medications:  No anticoagulation in PTA meds.   Assessment: NSTEMI, Now S/P LEFT HEART CATH AND CORONARY ANGIOGRAPHY (N/A)  Goal of Therapy:  Heparin level 0.3-0.7 units/ml Monitor platelets by anticoagulation protocol: Yes   Plan:  Restart heparin infusion 2 hours post sheath removal per Dr. Okey DupreEnd.   Sheath removal documented at 16:01 on 1/8. Restart heparin infusion at  1000 units/hr at 1801. First heparin level 6 hours after start of infusion.  01/09 0135 heparin level 0.2.  1200 bolus and increase rate to 1150 units/hr. Recheck in 6 hours.  01/09 1240 HL 0.39, therapeutic. Will recheck level in 6 hours   Luan PullingGarrett Jonetta Herrera, PharmD, BCPS Clinical Pharmacist 11/05/2017 12:39 PM

## 2017-11-05 NOTE — Progress Notes (Signed)
Progress Note  Patient Name: Caitlyn Herrera Date of Encounter: 11/05/2017  Primary Cardiologist: New to [facility] - consult by End  Subjective   Admitted with NSTEMI 1/8. LHC showed severe, three-vessel CAD as detailed below. Recommendation has been made to transfer for possible CABG. Awaiting bed at this time.   Had an episode of chest pain overnight that resolved with SL NTG. Currently chest pain free. BP elevated this morning with systolic in the 160s mmHg. Remains on heparin gtt.   Inpatient Medications    Scheduled Meds: . aspirin EC  81 mg Oral Daily  . atorvastatin  40 mg Oral q1800  . metoprolol tartrate  12.5 mg Oral BID  . protein supplement shake  11 oz Oral BID BM  . sodium chloride flush  3 mL Intravenous Q12H   Continuous Infusions: . sodium chloride    . dextrose 5 % and 0.9% NaCl 75 mL/hr at 11/04/17 0603  . heparin 1,150 Units/hr (11/05/17 0830)   PRN Meds: sodium chloride, acetaminophen, morphine injection, nitroGLYCERIN, ondansetron (ZOFRAN) IV, sodium chloride flush   Vital Signs    Vitals:   11/04/17 1809 11/04/17 1922 11/05/17 0523 11/05/17 0828  BP: (!) 116/55 115/66 127/64 (!) 162/89  Pulse: 61 60 66 72  Resp: 18 18 18 16   Temp:  (!) 97.5 F (36.4 C) 98.2 F (36.8 C)   TempSrc:  Oral Oral   SpO2: 100% 100% 99%   Weight:      Height:        Intake/Output Summary (Last 24 hours) at 11/05/2017 0849 Last data filed at 11/05/2017 0522 Gross per 24 hour  Intake 865.45 ml  Output 1025 ml  Net -159.55 ml   Filed Weights   11/04/17 0042 11/04/17 0353 11/04/17 1121  Weight: 220 lb (99.8 kg) 186 lb 6.4 oz (84.6 kg) 186 lb (84.4 kg)    Telemetry    NSR with occasional PACs/PVCs - Personally Reviewed  ECG    n/a - Personally Reviewed  Physical Exam   GEN: No acute distress.   Neck: No JVD. Cardiac: RRR, no murmurs, rubs, or gallops. Right radial cath site without bleeding, bruising, swelling, erythema, or TTP. Radial pulse 2+.    Respiratory: Clear to auscultation bilaterally.  GI: Soft, nontender, non-distended.   MS: No edema; No deformity. Neuro:  Alert and oriented x 3; Nonfocal.  Psych: Normal affect.  Labs    Chemistry Recent Labs  Lab 11/04/17 0037 11/04/17 0413  NA 137 138  K 4.2 4.0  CL 101 104  CO2 26 26  GLUCOSE 129* 123*  BUN 11 12  CREATININE 1.17* 1.13*  CALCIUM 10.1 9.7  GFRNONAA 50* 53*  GFRAA 58* >60  ANIONGAP 10 8     Hematology Recent Labs  Lab 11/04/17 0037  WBC 10.5  RBC 4.62  HGB 14.8  HCT 43.2  MCV 93.7  MCH 32.1  MCHC 34.3  RDW 13.1  PLT 406    Cardiac Enzymes Recent Labs  Lab 11/04/17 0037 11/04/17 0413 11/04/17 0952 11/04/17 1802  TROPONINI 0.29* 0.94* 1.67* 1.91*   No results for input(s): TROPIPOC in the last 168 hours.   BNPNo results for input(s): BNP, PROBNP in the last 168 hours.   DDimer No results for input(s): DDIMER in the last 168 hours.   Radiology    Dg Chest 2 View  Result Date: 11/04/2017 IMPRESSION: No acute abnormality. Electronically Signed   By: Rubye Oaks M.D.   On: 11/04/2017  03:12    Cardiac Studies   LHC 11/04/2017: Coronary Findings   Diagnostic  Dominance: Right  Left Main  Vessel was injected. Vessel is large.  Dist LM lesion 30% stenosed  Dist LM lesion is 30% stenosed.  Left Anterior Descending  Vessel was injected. Vessel is moderate in size. The vessel is severely tortuous.  Mid LAD lesion 90% stenosed  Mid LAD lesion is 90% stenosed. The lesion is thrombotic.  First Diagonal Branch  Vessel is small in size.  Second Diagonal Branch  Vessel is small in size.  Third Diagonal Branch  Vessel is small in size.  Left Circumflex  Vessel was injected. Vessel is moderate in size. The vessel is moderately tortuous.  Prox Cx to Mid Cx lesion 80% stenosed  Prox Cx to Mid Cx lesion is 80% stenosed. The lesion is irregular.  First Obtuse Marginal Branch  Vessel is moderate in size.  Lateral First Obtuse  Marginal Branch  Lat 1st Mrg lesion 99% stenosed  Lat 1st Mrg lesion is 99% stenosed.  Second Obtuse Marginal Branch  Vessel is moderate in size.  2nd Mrg lesion 30% stenosed  2nd Mrg lesion is 30% stenosed.  Right Coronary Artery  Vessel was injected. Vessel is moderate in size. The vessel is severely tortuous.  Prox RCA lesion 80% stenosed  Prox RCA lesion is 80% stenosed.  Mid RCA lesion 60% stenosed  Mid RCA lesion is 60% stenosed.  Dist RCA lesion 90% stenosed  Dist RCA lesion is 90% stenosed.  Right Posterior Descending Artery  Vessel is small in size.  Right Posterior Atrioventricular Branch  Vessel is small in size.  Intervention   No interventions have been documented.  Left Heart   Left Ventricle LVEDP 15 mmHg.  Aortic Valve There is no aortic valve stenosis.  Coronary Diagrams   Diagnostic Diagram        Conclusion   Conclusions: 1. Severe, three-vessel coronary artery disease, as detailed below. 2. Upper normal left ventricular filling pressure.  Recommendations: 1. Transfer to tertiary care center for cardiac surgery consultation for CABG. 2. Restart heparin infusion in 2 hours. 3. Aggressive secondary prevention, including high-intensity statin therapy.    TTE 11/04/2017: Study Conclusions  - Left ventricle: The cavity size was normal. Wall thickness was   increased in a pattern of moderate LVH. Systolic function was   vigorous. The estimated ejection fraction was in the range of 65%   to 70%. Wall motion was normal; there were no regional wall   motion abnormalities. Left ventricular diastolic function   parameters were normal. - Right ventricle: The cavity size was normal. Systolic function   was normal. - Tricuspid valve: There was mild regurgitation.  Patient Profile     57 y.o. female with history of HTN, HLD, and ongoing tobacco abuse who was admitted 1/8 with a NSTEMI. LHC showed severe 3-vessel CAD with recommendation for CABG. She is  currently awaiting bed placement.   Assessment & Plan    1. NSTEMI: -Had episode of chest pain overnight that resolved with SL NTG -Add Imdur 30 mg daily -LHC 1/8 as above with recommendation to transfer for possible CABG -Heparin gtt -ASA -Lopressor -SL NTG prn -Awaiting bed placement this morning   2. HTN: -Remains elevated -Add Imdur as above -Continue Lopressor   3. HLD: -LDL 172 -Lipitor  -If LDL not at goal at outpatient follow up consider Zetia vs PCSK9 inhibitor  4. DM: -SSI per IM  5. Tobacco abuse: -Cessation  advised    For questions or updates, please contact [facility] Please consult [email or web address] for contact info under Cardiology/STEMI.    Signed, Eula Listenyan Glynn Freas, PA-C [facility] Pager: [phone] 11/05/2017, 8:49 AM

## 2017-11-05 NOTE — Progress Notes (Signed)
[facility] notified this RN that bed was available and they were on the way to transfer patient. Report given to [facility]. Report was also called to accepting RN [facility]aron AquasJas Lem[facility].

## 2017-11-05 NOTE — Discharge Summary (Signed)
[employer] - [place]  at [facility]   PATIENT NAME: Caitlyn Herrera    MR#:  [id number]  DATE OF BIRTH:  1961-07-02  DATE OF ADMISSION:  11/04/2017   ADMITTING PHYSICIAN: Ihor Austin, MD  DATE OF DISCHARGE: 11/05/2017 12:30 PM  PRIMARY CARE PHYSICIAN: [name]   ADMISSION DIAGNOSIS:  Unstable angina pectoris (HCC) [I20.0] Elevated troponin [R74.8] DISCHARGE DIAGNOSIS:  Active Problems:   Non-STEMI (non-ST elevated myocardial infarction) (HCC)  SECONDARY DIAGNOSIS:   Past Medical History:  Diagnosis Date  . CAD (coronary artery disease)    a. NSTEMI 11/04/17; b. LHC dLM 30%, mLAD 90%, p-mLCx 80%, OM1 99%, OM2 30%, pRCA 80% mRCA 60%, dRCA 90%  . Diabetes mellitus (HCC)   . Hyperlipidemia   . Hypertension   . NSTEMI (non-ST elevated myocardial infarction) (HCC) 11/04/2017   a. TTE 11/04/2017: EF of 65-70%, no RWMA, normal LV diastolic function, normal RV cavity size and systolic function, mild TR   HOSPITAL COURSE:   1. NSTEMI: caridac cath yesterday: Severe, three-vessel coronary artery disease Restarted Heparin gtt 2 hours after cath and transfer to [facility] for CABG per Dr. Okey Dupre. The patient still has some chest pain on heparin drip.  Started Lipitor and Imdur.  2. HTN: -Lopressor 3. HLD: on Lipitor.   Tobacco abuse.  Smoking cessation was counseled. But is available in [facility]. DISCHARGE CONDITIONS:  Guarded, she is transferred to [facility] CONSULTS OBTAINED:  Treatment Team:  Yvonne Kendall, MD DRUG ALLERGIES:  No Known Allergies DISCHARGE MEDICATIONS:   Allergies as of 11/05/2017   No Known Allergies     Medication List    STOP taking these medications   cephALEXin 500 MG capsule Commonly known as:  KEFLEX     TAKE these medications   amLODipine 5 MG tablet Commonly known as:  NORVASC Take 5 mg by mouth daily.   atorvastatin 80 MG tablet Commonly known as:   LIPITOR Take 1 tablet (80 mg total) by mouth daily at 6 PM.   heparin 100-0.45 UNIT/ML-% infusion Inject 1,000 Units/hr into the vein continuous.   isosorbide mononitrate 30 MG 24 hr tablet Commonly known as:  IMDUR Take 1 tablet (30 mg total) by mouth daily. Start taking on:  11/06/2017   metoprolol tartrate 25 MG tablet Commonly known as:  LOPRESSOR Take 0.5 tablets (12.5 mg total) by mouth 2 (two) times daily.   ondansetron 4 MG disintegrating tablet Commonly known as:  ZOFRAN ODT Take 1 tablet (4 mg total) by mouth every 8 (eight) hours as needed for nausea or vomiting.        DISCHARGE INSTRUCTIONS:  See AVS.  If you experience worsening of your admission symptoms, develop shortness of breath, life threatening emergency, suicidal or homicidal thoughts you must seek medical attention immediately by calling 911 or calling your MD immediately  if symptoms less severe.  You Must read complete instructions/literature along with all the possible adverse reactions/side effects for all the Medicines you take and that have been prescribed to you. Take any new Medicines after you have completely understood and accpet all the possible adverse reactions/side effects.   Please note  You were cared for by a hospitalist during your hospital stay. If you have any questions about your discharge medications or the care you received while you were in the hospital after you are discharged, you can call the unit and asked to speak with the hospitalist on call if the  hospitalist that took care of you is not available. Once you are discharged, your primary care physician will handle any further medical issues. Please note that NO REFILLS for any discharge medications will be authorized once you are discharged, as it is imperative that you return to your primary care physician (or establish a relationship with a primary care physician if you do not have one) for your aftercare needs so that they can  reassess your need for medications and monitor your lab values.    On the day of Discharge:  VITAL SIGNS:  Blood pressure (!) 162/89, pulse 72, temperature 98.2 F (36.8 C), temperature source Oral, resp. rate 16, height 5\' 6"  (1.676 m), weight 186 lb (84.4 kg), SpO2 99 %. PHYSICAL EXAMINATION:  GENERAL:  57 y.o.-year-old patient lying in the bed with no acute distress.  EYES: Pupils equal, round, reactive to light and accommodation. No scleral icterus. Extraocular muscles intact.  HEENT: Head atraumatic, normocephalic. Oropharynx and nasopharynx clear.  NECK:  Supple, no jugular venous distention. No thyroid enlargement, no tenderness.  LUNGS: Normal breath sounds bilaterally, no wheezing, rales,rhonchi or crepitation. No use of accessory muscles of respiration.  CARDIOVASCULAR: S1, S2 normal. No murmurs, rubs, or gallops.  ABDOMEN: Soft, non-tender, non-distended. Bowel sounds present. No organomegaly or mass.  EXTREMITIES: No pedal edema, cyanosis, or clubbing.  NEUROLOGIC: Cranial nerves II through XII are intact. Muscle strength 5/5 in all extremities. Sensation intact. Gait not checked.  PSYCHIATRIC: The patient is alert and oriented x 3.  SKIN: No obvious rash, lesion, or ulcer.  DATA REVIEW:   CBC Recent Labs  Lab 11/05/17 1128  WBC 10.1  HGB 13.4  HCT 40.0  PLT 330    Chemistries  Recent Labs  Lab 11/04/17 0413  NA 138  K 4.0  CL 104  CO2 26  GLUCOSE 123*  BUN 12  CREATININE 1.13*  CALCIUM 9.7     Microbiology Results  Results for orders placed or performed during the hospital encounter of 08/12/11  Rapid strep screen     Status: None   Collection Time: 08/12/11  4:10 AM  Result Value Ref Range Status   Streptococcus, Group A Screen (Direct) NEGATIVE NEGATIVE Final    Comment:        DUE TO INADEQUATE SENSITIVITY OF EIA RAPID TESTS FOR GROUP A STREP (GAS) IT IS RECOMMENDED THAT ALL NEGATIVE RESULTS BE FOLLOWED BY A GROUP A STREP PROBE.  Culture,  routine-abscess     Status: None   Collection Time: 08/12/11  5:30 AM  Result Value Ref Range Status   Specimen Description ABSCESS  Final   Special Requests PERINEUM  Final   Gram Stain   Final    ABUNDANT WBC PRESENT, PREDOMINANTLY PMN RARE SQUAMOUS EPITHELIAL CELLS PRESENT FEW GRAM POSITIVE RODS FEW GRAM NEGATIVE RODS RARE GRAM POSITIVE COCCI IN PAIRS   Culture   Final    MULTIPLE ORGANISMS PRESENT, NONE PREDOMINANT Note: NO GROUP A STREP (S.PYOGENES) ISOLATED NO STAPHYLOCOCCUS AUREUS ISOLATED   Report Status 08/15/2011 FINAL  Final  Wet prep, genital     Status: Abnormal   Collection Time: 08/12/11  5:50 AM  Result Value Ref Range Status   Yeast Wet Prep HPF POC NONE SEEN NONE SEEN Final   Trich, Wet Prep TOO NUMEROUS TO COUNT (A) NONE SEEN Final   Clue Cells Wet Prep HPF POC FEW (A) NONE SEEN Final   WBC, Wet Prep HPF POC MODERATE (A) NONE SEEN Final  RADIOLOGY:  No results found.   Management plans discussed with the patient, family and they are in agreement.  CODE STATUS: Full Code   TOTAL TIME TAKING CARE OF THIS PATIENT: 33 minutes.    Shaune Pollack M.D on 11/05/2017 at 1:36 PM  Between 7am to 6pm - Pager - [phone]  After 6pm go to [email or web address] - Scientist, research (life sciences) [facility]  Office  [phone]  CC: Primary care physician; [name]   Note: This dictation was prepared with Nurse, children's dictation along with smaller phrase technology. Any transcriptional errors that result from this process are unintentional.

## 2017-11-05 NOTE — H&P (Signed)
Cardiology Admission History and Physical:   Patient ID: Caitlyn Herrera; MRN: [id number]; DOB: 1961/03/30   Admission date: (Not on file)  Primary Care Provider: [facility] Primary Cardiologist: Yvonne Kendallhristopher End, MD   Chief Complaint:  Chest pain  Patient Profile:   Caitlyn Herrera is a 57 y.o. female with a history of HTN, HLD, newly diagnosed DM this admission and ongoing tobacco abuse who was [facility] to [facility] on 1/8 with a NSTEMI. Subsequent LHC on 1/8 demonstrated severe three-vessel CAD with recommendation for [facility] for possible [facility]G.   History of Present Illness:   [name] has no previously known cardiac history. Over the past 2 weeks she has noted worsening exertional SOB. This has been newly associated with recent onset of exertional chest pain that was first noticed while at work on 1/6. She works as a LawyerCNA and has noted exertional, substernal chest pain that has become more progressive prompting her to present for further evaluation. There has been associated nausea, vomiting, and SOB. Pain radiates to her bilateral arms. Pain is characterized as sharp and dull. On occasion, the pain has worsened with cough, though it is more so associated with exertion. Upon the patient's arrival to [facility] they were found to have elevated BP that has since improved. EKG showed NSR, 79 bpm, nonspecific inferolateral st depression, CXR showed no acute process. Labs showed troponin 0.29-->0.94-->1.67-->1.91, SCr 1.17-->1.13, K+ 4.3, glucose 129, LDL 172, A1c 6.6, cbc unremarkable. She was given ASA, morphine, SL NTG, nitropaste, and Zofran. She was started on heparin gtt. Chest pain had resolved upon cardiology seeing her on 1/8, though she continued to note nausea. She underwent LHC on 1/8 that showed severe three-vessel CAD as detailed below. Recommendation was made to transfer [facility] for possible CABG. Echo 11/04/17 showed an EF of 65-70%, no RWMA, normal LV  diastolic function, normal RV cavity size and systolic function, mild TR. She was resumed on heparin gtt following sheath removal per protocol. She has been continued on ASA only. [facility]. Overnight, into 1/9, she had a brief episode of chest pain that resolved with SL NTG. On the morning of 1/9, she was without chest pain. Blood pressure elevated into the 160s systolic. She was started on Imdur. [facility] and she is being prepped for transfer.    Past Medical History:  Diagnosis Date  . CAD (coronary artery disease)    a. NSTEMI 11/04/17; b. LHC dLM 30%, mLAD 90%, p-mLCx 80%, OM1 99%, OM2 30%, pRCA 80% mRCA 60%, dRCA 90%  . Diabetes mellitus (HCC)   . Hyperlipidemia   . Hypertension   . NSTEMI (non-ST elevated myocardial infarction) (HCC) 11/04/2017   a. TTE 11/04/2017: EF of 65-70%, no RWMA, normal LV diastolic function, normal RV cavity size and systolic function, mild TR    Past Surgical History:  Procedure Laterality Date  . CHOLECYSTECTOMY    . CYST EXCISION     Right breast  . LEFT HEART CATH AND CORONARY ANGIOGRAPHY N/A 11/04/2017   Procedure: LEFT HEART CATH AND CORONARY ANGIOGRAPHY;  Surgeon: [name], Christopher, MD;  Location: [facility] INVASIVE CV LAB;  Service: Cardiovascular;  Laterality: N/A;     Medications Prior to Admission: Prior to Admission medications   Medication Sig Start Date End Date Taking? Authorizing Provider  amLODipine (NORVASC) 5 MG tablet Take 5 mg by mouth daily.    [provider]  cephALEXin (KEFLEX) 500 MG capsule Take 1 capsule (500 mg total) by mouth 3 (three) times daily. Patient not taking: Reported on 11/04/2017 12/12/16   Irean Hong, MD  heparin 100-0.45 UNIT/ML-% infusion Inject 1,000 Units/hr into the vein continuous. 11/04/17   Shaune Pollack, MD  metoprolol tartrate (LOPRESSOR) 25 MG tablet Take 0.5 tablets (12.5 mg total) by mouth 2 (two) times daily. 11/04/17   Shaune Pollack,  MD  ondansetron (ZOFRAN ODT) 4 MG disintegrating tablet Take 1 tablet (4 mg total) by mouth every 8 (eight) hours as needed for nausea or vomiting. 12/12/16   Irean Hong, MD     Allergies:   No Known Allergies  Social History:   Social History   Socioeconomic History  . Marital status: Widowed    Spouse name: Not on file  . Number of children: Not on file  . Years of education: Not on file  . Highest education level: Not on file  Social Needs  . Financial resource strain: Not on file  . Food insecurity - worry: Not on file  . Food insecurity - inability: Not on file  . Transportation needs - medical: Not on file  . Transportation needs - non-medical: Not on file  Occupational History    Employer: [employer]  Tobacco Use  . Smoking status: Former Games developer  . Smokeless tobacco: Never Used  Substance and Sexual Activity  . Alcohol use: No  . Drug use: Not on file  . Sexual activity: Yes  Other Topics Concern  . Not on file  Social History Narrative  . Not on file    Family History:   The patient's family history includes Hypertension in her mother.    ROS:  Please see the history of present illness.  All other ROS reviewed and negative.     Physical Exam/Data:   Vitals:   11/04/17 1809 11/04/17 1922 11/05/17 0523 11/05/17 0828  BP: (!) 116/55 115/66 127/64 (!) 162/89  Pulse: 61 60 66 72  Resp: 18 18 18 16   Temp:  (!) 97.5 F (36.4 C) 98.2 F (36.8 C)   TempSrc:  Oral Oral   SpO2: 100% 100% 99%   Weight:      Height:        Intake/Output Summary (Last 24 hours) at 11/05/2017 0849 Last data filed at 11/05/2017 0522    Gross per 24 hour  Intake 865.45 ml  Output 1025 ml  Net -159.55 ml        Filed Weights   11/04/17 0042 11/04/17 0353 11/04/17 1121  Weight: 220 lb (99.8 kg) 186 lb 6.4 oz (84.6 kg) 186 lb (84.4 kg)     General:  Well nourished, well developed, in no acute distress HEENT: normal Lymph: no adenopathy Neck: no  JVD Endocrine:  No thryomegaly Vascular: No carotid bruits; FA pulses 2+ bilaterally without bruits  Cardiac:  normal S1, S2; RRR; no murmur  Lungs:  clear to auscultation bilaterally, no wheezing, rhonchi or rales  Abd: soft, nontender, no hepatomegaly  Ext: no edema Musculoskeletal:  No deformities, BUE and BLE strength normal and equal Skin: warm and dry  Neuro:  CNs 2-12 intact, no focal abnormalities noted Psych:  Normal affect    EKG:  The ECG that was done was personally reviewed and demonstrates NSR, 79 bpm, nonspecific inferolateral st depression  Relevant CV Studies: LHC 11/04/2017: Coronary Findings   Diagnostic  Dominance: Right  Left Main  Vessel was injected.  Vessel is large.  Dist LM lesion 30% stenosed  Dist LM lesion is 30% stenosed.  Left Anterior Descending  Vessel was injected. Vessel is moderate in size. The vessel is severely tortuous.  Mid LAD lesion 90% stenosed  Mid LAD lesion is 90% stenosed. The lesion is thrombotic.  First Diagonal Branch  Vessel is small in size.  Second Diagonal Branch  Vessel is small in size.  Third Diagonal Branch  Vessel is small in size.  Left Circumflex  Vessel was injected. Vessel is moderate in size. The vessel is moderately tortuous.  Prox Cx to Mid Cx lesion 80% stenosed  Prox Cx to Mid Cx lesion is 80% stenosed. The lesion is irregular.  First Obtuse Marginal Branch  Vessel is moderate in size.  Lateral First Obtuse Marginal Branch  Lat 1st Mrg lesion 99% stenosed  Lat 1st Mrg lesion is 99% stenosed.  Second Obtuse Marginal Branch  Vessel is moderate in size.  2nd Mrg lesion 30% stenosed  2nd Mrg lesion is 30% stenosed.  Right Coronary Artery  Vessel was injected. Vessel is moderate in size. The vessel is severely tortuous.  Prox RCA lesion 80% stenosed  Prox RCA lesion is 80% stenosed.  Mid RCA lesion 60% stenosed  Mid RCA lesion is 60% stenosed.  Dist RCA lesion 90% stenosed  Dist RCA lesion is 90%  stenosed.  Right Posterior Descending Artery  Vessel is small in size.  Right Posterior Atrioventricular Branch  Vessel is small in size.  Intervention   No interventions have been documented.  Left Heart   Left Ventricle LVEDP 15 mmHg.  Aortic Valve There is no aortic valve stenosis.  Coronary Diagrams   Diagnostic Diagram        Conclusion   Conclusions: 1. Severe, three-vessel coronary artery disease, as detailed below. 2. Upper normal left ventricular filling pressure.  Recommendations: 1. Transfer to tertiary care center for cardiac surgery consultation for CABG. 2. Restart heparin infusion in 2 hours. 3. Aggressive secondary prevention, including high-intensity statin therapy.    TTE 11/04/2017: Study Conclusions  - Left ventricle: The cavity size was normal. Wall thickness was increased in a pattern of moderate LVH. Systolic function was vigorous. The estimated ejection fraction was in the range of 65% to 70%. Wall motion was normal; there were no regional wall motion abnormalities. Left ventricular diastolic function parameters were normal. - Right ventricle: The cavity size was normal. Systolic function was normal. - Tricuspid valve: There was mild regurgitation.   Laboratory Data:  Chemistry Recent Labs  Lab 11/04/17 0037 11/04/17 0413  NA 137 138  K 4.2 4.0  CL 101 104  CO2 26 26  GLUCOSE 129* 123*  BUN 11 12  CREATININE 1.17* 1.13*  CALCIUM 10.1 9.7  GFRNONAA 50* 53*  GFRAA 58* >60  ANIONGAP 10 8    No results for input(s): PROT, ALBUMIN, AST, ALT, ALKPHOS, BILITOT in the last 168 hours. Hematology Recent Labs  Lab 11/04/17 0037  WBC 10.5  RBC 4.62  HGB 14.8  HCT 43.2  MCV 93.7  MCH 32.1  MCHC 34.3  RDW 13.1  PLT 406   Cardiac Enzymes Recent Labs  Lab 11/04/17 0413 11/04/17 0952 11/04/17 1802  TROPONINI 0.94* 1.67* 1.91*   No results for input(s): TROPIPOC in the last 168 hours.  BNPNo results for  input(s): BNP, PROBNP in the last 168 hours.  DDimer No results for input(s): DDIMER in the last 168 hours.  Radiology/Studies:  No results found.  Assessment and Plan:   NSTEMI: -Had episode of chest pain overnight that resolved with SL NTG -Add Imdur 30 mg daily -LHC 1/8 as above with recommendation to transfer for possible CABG -Heparin gtt -ASA -Lopressor -SL NTG prn -Transfer to [facility]  2. HTN: -Remains elevated -Add Imdur as above -Continue Lopressor   3. HLD: -LDL 172 -Lipitor  -If LDL not at goal at outpatient follow up consider Zetia vs PCSK9 inhibitor  4. Newly diagnosed DM: -SSI -Consider diabetic counselor consult   5. Tobacco abuse: -Cessation advised   Severity of Illness: The appropriate patient status for this patient is INPATIENT. Inpatient status is judged to be reasonable and necessary in order to provide the required intensity of service to ensure the patient's safety. The patient's presenting symptoms, physical exam findings, and initial radiographic and laboratory data in the context of their chronic comorbidities is felt to place them at high risk for further clinical deterioration. Furthermore, it is not anticipated that the patient will be medically stable for discharge from the hospital within 2 midnights of admission. The following factors support the patient status of inpatient.   " The patient's presenting symptoms include unstable angina. " The worrisome physical exam findings include severe 3-vessel CAD as above. " The initial radiographic and laboratory data are worrisome because of as abvoe. " The chronic co-morbidities include as above.   * I certify that at the point of admission it is my clinical judgment that the patient will require inpatient hospital care spanning beyond 2 midnights from the point of admission due to high intensity of service, high risk for further deterioration and high frequency of surveillance required.*     For questions or updates, please contact [facility] Please consult [email or web address] for contact info under Cardiology/STEMI.    Signed, Eula Listen, PA-C  11/05/2017 11:03 AM

## 2017-11-05 NOTE — Care Management (Signed)
For transfer to tertiary facility for cabg consideration

## 2017-11-05 NOTE — Progress Notes (Signed)
ANTICOAGULATION CONSULT NOTE - Initial Consult  Pharmacy Consult for heparin drip Indication: chest pain/ACS  No Known Allergies  Patient Measurements: Height: 5\' 6"  (167.6 cm) Weight: 186 lb (84.4 kg) IBW/kg (Calculated) : 59.3 Heparin Dosing Weight: 82 kg  Vital Signs: Temp: 97.5 F (36.4 C) (01/08 1922) Temp Source: Oral (01/08 1922) BP: 115/66 (01/08 1922) Pulse Rate: 60 (01/08 1922)  Labs: Recent Labs    11/04/17 0037 11/04/17 0413 11/04/17 0952 11/04/17 1802 11/05/17 0135  HGB 14.8  --   --   --   --   HCT 43.2  --   --   --   --   PLT 406  --   --   --   --   APTT  --  29  --   --   --   LABPROT  --  12.2  --   --   --   INR  --  0.91  --   --   --   HEPARINUNFRC  --   --   --   --  0.20*  CREATININE 1.17* 1.13*  --   --   --   TROPONINI 0.29* 0.94* 1.67* 1.91*  --     Estimated Creatinine Clearance: 59.4 mL/min (A) (by C-G formula based on SCr of 1.13 mg/dL (H)).   Medical History: Past Medical History:  Diagnosis Date  . Hyperlipidemia   . Hypertension     Medications:  No anticoagulation in PTA meds.   Assessment: NSTEMI, Now S/P LEFT HEART CATH AND CORONARY ANGIOGRAPHY (N/A)  Goal of Therapy:  Heparin level 0.3-0.7 units/ml Monitor platelets by anticoagulation protocol: Yes   Plan:  Restart heparin infusion 2 hours post sheath removal per Dr. Okey DupreEnd.   Sheath removal documented at 16:01 on 1/8. Restart heparin infusion at  1000 units/hr at 1801. First heparin level 6 hours after start of infusion.  01/09 0135 heparin level 0.2. 1200 bolus and increase rate to 1150 units/hr. Recheck in 6 hours.  Erich MontaneMcBane,Miguelangel Korn S, PharmD, BCPS Clinical Pharmacist 11/05/2017 4:36 AM

## 2017-11-05 NOTE — Progress Notes (Signed)
  Subjective: Patient examined, coronary angiogram images personally reviewed as well as 2-D echo cardio gram images and discussed with patient and daughter. Full consult to follow.  57 year old AA female nondiabetic smoker transferred [facility] after presenting with a non-STEMI. Cardiac catheterization demonstrates severe three-vessel CAD. LV function appears to be well-preserved. Echocardiogram shows no significant valvular disease. 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  The patient is nondiabetic but is active smoker. She's had previous cholecystectomy without anesthesia or bleeding problems.  The patient would benefit from multivessel CABG. This will be scheduled for the first available opening in the OR schedule. I discussed the procedure of CABG in detail the patient and her daughter. The patient was very emotional and distraught but agrees to proceed with surgery either later this week or early next week.  Objective: Vital signs in last 24 hours: Temp:  [97.5 F (36.4 C)-98.2 F (36.8 C)] 97.8 F (36.6 C) (01/09 1352) Pulse Rate:  [60-72] 60 (01/09 1352) Cardiac Rhythm: Sinus bradycardia (01/09 1425) Resp:  [16-18] 16 (01/09 0828) BP: (115-162)/(64-89) 118/72 (01/09 1352) SpO2:  [99 %-100 %] 99 % (01/09 0523) Weight:  [187 lb 11.2 oz (85.1 kg)] 187 lb 11.2 oz (85.1 kg) (01/09 1352)  Hemodynamic parameters for last 24 hours:  sinus rhythm  Intake/Output from previous day: No intake/output data recorded. Intake/Output this shift: No intake/output data recorded.       Exam    General- alert and comfortable    Neck- no JVD, no cervical adenopathy palpable, no carotid bruit   Lungs- clear without rales, wheezes   Cor- regular rate and rhythm, no murmur , gallop   Abdomen- soft, non-tender   Extremities - warm, non-tender, minimal edema   Neuro- oriented, appropriate, no focal  weakness   Lab Results: Recent Labs    11/04/17 0037 11/05/17 1128  WBC 10.5 10.1  HGB 14.8 13.4  HCT 43.2 40.0  PLT 406 330   BMET:  Recent Labs    11/04/17 0037 11/04/17 0413  NA 137 138  K 4.2 4.0  CL 101 104  CO2 26 26  GLUCOSE 129* 123*  BUN 11 12  CREATININE 1.17* 1.13*  CALCIUM 10.1 9.7    PT/INR:  Recent Labs    11/04/17 0413  LABPROT 12.2  INR 0.91   ABG No results found for: PHART, HCO3, TCO2, ACIDBASEDEF, O2SAT CBG (last 3)  Recent Labs    11/05/17 1659  GLUCAP 172*    Assessment/Plan: S/P  Pre-CABG Dopplers and PFTs ordered Will follow  LOS: 0 days    Kathlee Nationseter Van Trigt III 11/05/2017

## 2017-11-06 ENCOUNTER — Inpatient Hospital Stay (HOSPITAL_COMMUNITY): Payer: BLUE CROSS/BLUE SHIELD

## 2017-11-06 DIAGNOSIS — E7800 Pure hypercholesterolemia, unspecified: Secondary | ICD-10-CM

## 2017-11-06 DIAGNOSIS — I493 Ventricular premature depolarization: Secondary | ICD-10-CM

## 2017-11-06 DIAGNOSIS — I10 Essential (primary) hypertension: Secondary | ICD-10-CM

## 2017-11-06 DIAGNOSIS — Z720 Tobacco use: Secondary | ICD-10-CM | POA: Diagnosis present

## 2017-11-06 DIAGNOSIS — I214 Non-ST elevation (NSTEMI) myocardial infarction: Principal | ICD-10-CM

## 2017-11-06 LAB — PULMONARY FUNCTION TEST
FEF 25-75 Post: 2.17 L/sec
FEF 25-75 Pre: 2.42 L/sec
FEF2575-%Change-Post: -10 %
FEF2575-%Pred-Post: 93 %
FEF2575-%Pred-Pre: 103 %
FEV1-%Change-Post: -1 %
FEV1-%Pred-Post: 94 %
FEV1-%Pred-Pre: 96 %
FEV1-Post: 2.26 L
FEV1-Pre: 2.3 L
FEV1FVC-%Change-Post: 6 %
FEV1FVC-%Pred-Pre: 99 %
FEV6-%Change-Post: -7 %
FEV6-%Pred-Post: 90 %
FEV6-%Pred-Pre: 97 %
FEV6-Post: 2.67 L
FEV6-Pre: 2.88 L
FEV6FVC-%Pred-Post: 103 %
FEV6FVC-%Pred-Pre: 103 %
FVC-%Change-Post: -7 %
FVC-%Pred-Post: 88 %
FVC-%Pred-Pre: 95 %
FVC-Post: 2.67 L
FVC-Pre: 2.88 L
Post FEV1/FVC ratio: 85 %
Post FEV6/FVC ratio: 100 %
Pre FEV1/FVC ratio: 80 %
Pre FEV6/FVC Ratio: 100 %

## 2017-11-06 LAB — CBC
HCT: 40 % (ref 36.0–46.0)
Hemoglobin: 12.7 g/dL (ref 12.0–15.0)
MCH: 30.4 pg (ref 26.0–34.0)
MCHC: 31.8 g/dL (ref 30.0–36.0)
MCV: 95.7 fL (ref 78.0–100.0)
Platelets: 363 10*3/uL (ref 150–400)
RBC: 4.18 MIL/uL (ref 3.87–5.11)
RDW: 12.8 % (ref 11.5–15.5)
WBC: 11.3 10*3/uL — AB (ref 4.0–10.5)

## 2017-11-06 LAB — GLUCOSE, CAPILLARY
GLUCOSE-CAPILLARY: 123 mg/dL — AB (ref 65–99)
Glucose-Capillary: 109 mg/dL — ABNORMAL HIGH (ref 65–99)
Glucose-Capillary: 193 mg/dL — ABNORMAL HIGH (ref 65–99)
Glucose-Capillary: 129 mg/dL — ABNORMAL HIGH (ref 65–99)

## 2017-11-06 LAB — COMPREHENSIVE METABOLIC PANEL
ALT: 17 U/L (ref 14–54)
AST: 25 U/L (ref 15–41)
Albumin: 3.5 g/dL (ref 3.5–5.0)
Alkaline Phosphatase: 76 U/L (ref 38–126)
Anion gap: 8 (ref 5–15)
BUN: 10 mg/dL (ref 6–20)
CO2: 23 mmol/L (ref 22–32)
Calcium: 9.2 mg/dL (ref 8.9–10.3)
Chloride: 106 mmol/L (ref 101–111)
Creatinine, Ser: 1.01 mg/dL — ABNORMAL HIGH (ref 0.44–1.00)
GFR calc Af Amer: >60 mL/min (ref >60)
GFR calc non Af Amer: >60 mL/min (ref >60)
Glucose, Bld: 110 mg/dL — ABNORMAL HIGH (ref 65–99)
Potassium: 4 mmol/L (ref 3.5–5.1)
Sodium: 137 mmol/L (ref 135–145)
Total Bilirubin: 0.6 mg/dL (ref 0.3–1.2)
Total Protein: 6.6 g/dL (ref 6.5–8.1)

## 2017-11-06 LAB — TROPONIN I
Troponin I: 0.98 ng/mL (ref <0.03)
Troponin I: 0.94 ng/mL (ref <0.03)

## 2017-11-06 LAB — PROTIME-INR
INR: 1.03
Prothrombin Time: 13.4 seconds (ref 11.4–15.2)

## 2017-11-06 LAB — HEPARIN LEVEL (UNFRACTIONATED)
HEPARIN UNFRACTIONATED: 0.81 [IU]/mL — AB (ref 0.30–0.70)
Heparin Unfractionated: 0.39 IU/mL (ref 0.30–0.70)

## 2017-11-06 MED ORDER — NICOTINE 14 MG/24HR TD PT24
14.0000 mg | MEDICATED_PATCH | Freq: Every day | TRANSDERMAL | Status: DC
  Administered 2017-11-06 – 2017-11-11 (×6): 14 mg via TRANSDERMAL
  Filled 2017-11-06 (×6): qty 1

## 2017-11-06 MED ORDER — METOPROLOL TARTRATE 12.5 MG HALF TABLET
12.5000 mg | ORAL_TABLET | Freq: Once | ORAL | Status: AC
  Administered 2017-11-06: 12.5 mg via ORAL
  Filled 2017-11-06: qty 1

## 2017-11-06 MED ORDER — METOPROLOL TARTRATE 25 MG PO TABS
25.0000 mg | ORAL_TABLET | Freq: Two times a day (BID) | ORAL | Status: DC
  Administered 2017-11-06 – 2017-11-11 (×11): 25 mg via ORAL
  Filled 2017-11-06 (×4): qty 1
  Filled 2017-11-06: qty 2
  Filled 2017-11-06 (×6): qty 1

## 2017-11-06 MED ORDER — NITROGLYCERIN IN D5W 200-5 MCG/ML-% IV SOLN
0.0000 ug/min | INTRAVENOUS | Status: DC
  Administered 2017-11-06 – 2017-11-11 (×2): 5 ug/min via INTRAVENOUS
  Filled 2017-11-06 (×2): qty 250

## 2017-11-06 NOTE — Progress Notes (Signed)
ANTICOAGULATION CONSULT NOTE - Follow Up Consult  Pharmacy Consult for Heparin Indication: multivessel disease pending CABG   No Known Allergies  Patient Measurements: Height: 5\' 6"  (167.6 cm) Weight: 187 lb 11.2 oz (85.1 kg) IBW/kg (Calculated) : 59.3 Heparin Dosing Weight: 77 kg  Vital Signs: Temp: 97.5 F (36.4 C) (01/10 0441) Temp Source: Oral (01/10 0441) BP: 156/57 (01/10 1031) Pulse Rate: 75 (01/10 0441)  Labs: Recent Labs    11/04/17 0037 11/04/17 0413 11/04/17 [id number] 11/04/17 1802 11/05/17 0135 11/05/17 1128 11/06/17 0826 11/06/17 0831  HGB 14.8  --   --   --   --  13.4 12.7  --   HCT 43.2  --   --   --   --  40.0 40.0  --   PLT 406  --   --   --   --  330 363  --   APTT  --  29  --   --   --   --   --   --   LABPROT  --  12.2  --   --   --   --  13.4  --   INR  --  0.91  --   --   --   --  1.03  --   HEPARINUNFRC  --   --   --   --  0.20* 0.39 0.81*  --   CREATININE 1.17* 1.13*  --   --   --   --   --  1.01*  TROPONINI 0.29* 0.94* 1.67* 1.91*  --   --   --  0.98*    Estimated Creatinine Clearance: 66.7 mL/min (A) (by C-G formula based on SCr of 1.01 mg/dL (H)).   Medications:  Scheduled:  . aspirin EC  81 mg Oral Daily  . atorvastatin  40 mg Oral q1800  . guaiFENesin  600 mg Oral BID  . insulin aspart  0-5 Units Subcutaneous QHS  . insulin aspart  0-9 Units Subcutaneous TID WC  . levalbuterol  1.25 mg Nebulization TID  . metoprolol tartrate  12.5 mg Oral BID  . mometasone-formoterol  2 puff Inhalation BID   Infusions:  . heparin 1,150 Units/hr (11/06/17 0955)  . nitroGLYCERIN 5 mcg/min (11/06/17 1038)    Assessment: 57 yo F transferred for [facility] for CABG eval for multivessel CAD.  Pt continues on heparin with slightly supratherapeutic level.  No bleeding noted.  Currently undergoing pre-CABG testing and awaiting scheduled time for procedure.  Goal of Therapy:  Heparin level 0.3-0.7 units/ml Monitor platelets by anticoagulation  protocol: Yes   Plan:  Reduce heparin to 1000 units/hr. Heparin level at 1800 Daily heparin level and CBC Follow-up CABG plans (?Monday)   Dixie DialsKimberly Johann Santone, Pharm.D., BCPS Clinical Pharmacist Pager: [phone] Clinical phone for 11/06/2017 from 8:30-4:00 is [phone]. After 4pm, please call Main Rx [phone]) for assistance. 11/06/2017 11:17 AM

## 2017-11-06 NOTE — Progress Notes (Signed)
Patient c/o of CP on mid chest area, stated it was sharp with pressure.  EKG was done, given nitroglycerine x 2, and Tylenol.  Patient stated pain subsided with treatment.  Will keep monitoring patient.

## 2017-11-06 NOTE — Progress Notes (Signed)
Progress Note  Patient Name: Caitlyn Herrera Date of Encounter: 11/06/2017  Primary Cardiologist: Yvonne Kendallhristopher End, MD   Subjective   Doing well. No CP or dyspnea. Just finished PFTs. Radial cath site is stable.   Inpatient Medications    Scheduled Meds: . aspirin EC  81 mg Oral Daily  . atorvastatin  40 mg Oral q1800  . guaiFENesin  600 mg Oral BID  . insulin aspart  0-5 Units Subcutaneous QHS  . insulin aspart  0-9 Units Subcutaneous TID WC  . isosorbide mononitrate  30 mg Oral Daily  . levalbuterol  1.25 mg Nebulization TID  . metoprolol tartrate  12.5 mg Oral BID  . mometasone-formoterol  2 puff Inhalation BID   Continuous Infusions: . heparin     PRN Meds: acetaminophen, nitroGLYCERIN, ondansetron (ZOFRAN) IV, polyethylene glycol   Vital Signs    Vitals:   11/06/17 0421 11/06/17 0425 11/06/17 0430 11/06/17 0441  BP: (!) 166/118  (!) 166/74 (!) 166/74  Pulse: (!) 113   75  Resp: (!) 23   (!) 29  Temp:    (!) 97.5 F (36.4 C)  TempSrc:    Oral  SpO2: 94% 100%  100%  Weight:      Height:        Intake/Output Summary (Last 24 hours) at 11/06/2017 0755 Last data filed at 11/06/2017 0500 Gross per 24 hour  Intake 738 ml  Output -  Net 738 ml   Filed Weights   11/05/17 1352  Weight: 187 lb 11.2 oz (85.1 kg)    Telemetry    NSR with PACs - Personally Reviewed  ECG    SR w/ PACs - Personally Reviewed  Physical Exam   GEN: No acute distress.   Neck: No JVD Cardiac: RRR, no murmurs, rubs, or gallops.  Respiratory: Clear to auscultation bilaterally. GI: Soft, nontender, non-distended  MS: No edema; No deformity. Neuro:  Nonfocal  Psych: Normal affect   Labs    Chemistry Recent Labs  Lab 11/04/17 0037 11/04/17 0413  NA 137 138  K 4.2 4.0  CL 101 104  CO2 26 26  GLUCOSE 129* 123*  BUN 11 12  CREATININE 1.17* 1.13*  CALCIUM 10.1 9.7  GFRNONAA 50* 53*  GFRAA 58* >60  ANIONGAP 10 8     Hematology Recent Labs  Lab 11/04/17 0037  11/05/17 1128  WBC 10.5 10.1  RBC 4.62 4.23  HGB 14.8 13.4  HCT 43.2 40.0  MCV 93.7 94.5  MCH 32.1 31.7  MCHC 34.3 33.5  RDW 13.1 12.9  PLT 406 330    Cardiac Enzymes Recent Labs  Lab 11/04/17 0037 11/04/17 0413 11/04/17 0952 11/04/17 1802  TROPONINI 0.29* 0.94* 1.67* 1.91*   No results for input(s): TROPIPOC in the last 168 hours.   BNPNo results for input(s): BNP, PROBNP in the last 168 hours.   DDimer No results for input(s): DDIMER in the last 168 hours.   Radiology    No results found.  Cardiac Studies   LHC 11/05/17 Coronary Findings   Diagnostic  Dominance: Right  Left Main  Vessel was injected. Vessel is large.  Dist LM lesion 30% stenosed  Dist LM lesion is 30% stenosed.  Left Anterior Descending  Vessel was injected. Vessel is moderate in size. The vessel is severely tortuous.  Mid LAD lesion 90% stenosed  Mid LAD lesion is 90% stenosed. The lesion is thrombotic.  First Diagonal Branch  Vessel is small in size.  Second Diagonal  Branch  Vessel is small in size.  Third Diagonal Branch  Vessel is small in size.  Left Circumflex  Vessel was injected. Vessel is moderate in size. The vessel is moderately tortuous.  Prox Cx to Mid Cx lesion 80% stenosed  Prox Cx to Mid Cx lesion is 80% stenosed. The lesion is irregular.  First Obtuse Marginal Branch  Vessel is moderate in size.  Lateral First Obtuse Marginal Branch  Lat 1st Mrg lesion 99% stenosed  Lat 1st Mrg lesion is 99% stenosed.  Second Obtuse Marginal Branch  Vessel is moderate in size.  2nd Mrg lesion 30% stenosed  2nd Mrg lesion is 30% stenosed.  Right Coronary Artery  Vessel was injected. Vessel is moderate in size. The vessel is severely tortuous.  Prox RCA lesion 80% stenosed  Prox RCA lesion is 80% stenosed.  Mid RCA lesion 60% stenosed  Mid RCA lesion is 60% stenosed.  Dist RCA lesion 90% stenosed  Dist RCA lesion is 90% stenosed.  Right Posterior Descending Artery  Vessel is  small in size.  Right Posterior Atrioventricular Branch  Vessel is small in size.    2D Echo 11/04/17  Study Conclusions  - Left ventricle: The cavity size was normal. Wall thickness was   increased in a pattern of moderate LVH. Systolic function was   vigorous. The estimated ejection fraction was in the range of 65%   to 70%. Wall motion was normal; there were no regional wall   motion abnormalities. Left ventricular diastolic function   parameters were normal. - Right ventricle: The cavity size was normal. Systolic function   was normal. - Tricuspid valve: There was mild regurgitation.  Patient Profile     Caitlyn Herrera is a 57 year old woman with history of hypertension, hyperlipidemia, newly diagnosed diabetes mellitus admitted with chest pain and elevated troponin consistent with NSTEMI.  Cardiac catheterization showed severe three-vessel coronary artery disease with preserved LVEF by echo.  Given her coronary anatomy and diabetes, Caitlyn Herrera has been transfer to [facility] for surgical assessment for CABG.    Assessment & Plan    1. NSTEMI/CAD: Troponin peaked at 1.91. LHC 11/05/17 showed severe multivessel disease as outlined above in cath note. LVEF preserved. Plan is for CABG later this week. She is currently CP free. Continue IV heparin, ASA, high dose statin, BB and LA nitrate.   2. HLD: LDL elevated at 172. High dose statin therapy with Lipitor added. Goal LDL is < 70 mg/dL. She will need a repeat FLP in 6-8 weeks. If not at goal, can add Zetia as an adjunct. May also consider PCSK9 inhibitor therapy if no good response to statin therapy.   3. T2DM: newly diagnosed. Hgb A1c 6.6. SSI ordered. Will need to f/u with PCP post discharge. She is on statin therapy. Will need to later consider addition of an ACE/ARB, if BP allows post CABG.   4. HTN: moderately elevated this morning. She is due to get AM meds, which include metoprolol and Imdur. Monitor BP response. Will adjust  accordingly.    For questions or updates, please contact [facility] Please consult [email or web address] for contact info under Cardiology/STEMI.      Signed, Robbie Lis, PA-C  11/06/2017, 7:55 AM

## 2017-11-06 NOTE — Progress Notes (Signed)
ANTICOAGULATION CONSULT NOTE - FOLLOW UP    HL = 0.39 (goal 0.3 - 0.7 units/mL) Heparin dosing weight = 77 kg   Assessment: 56 YOF continues on IV heparin for multi-vessel disease while awaiting CABG.  Heparin level is therapeutic; no bleeding reported.   Plan: Continue heparin gtt at 1000 units/hr F/U AM labs   Dyllin Gulley D. Laney Potashang, PharmD, BCPS 11/06/2017, 7:59 PM

## 2017-11-06 NOTE — Progress Notes (Signed)
[id number] Did not walk with pt as she is on NTG since she had CP this morning. Gave pt OHS booklet and care guide and discussed the importance of IS and mobility after surgery. Gave IS and pt demonstrated 1000 ml correctly. Gave pt fake cigarette as she is quitting smoking. Demonstrated for pt how to get up and down without use of arms to adhere to sternal precautions. Pt will have family available 24/7 after discharge.  Will follow up after surgery unless order given to walk prior to surgery. Luetta NuttingCharlene Baneen Wieseler RN BSN 11/06/2017 11:42 AM

## 2017-11-06 NOTE — Consult Note (Signed)
[address]       [place] [address]             [phone]        COOPER STAMP [facility] Medical Record #[id number] Date of Birth: 08/21/1961  Referring:Dr End Primary Care: [name] Primary Cardiologist:Christopher End, MD  Chief Complaint:   Chest pain  History of Present Illness:     Patient examined, coronary angiogram images and echocardiogram images personally reviewed and counseled with patient  58 year old AA female smoker admitted with chest pain and positive cardiac enzymes, non-STEMI. She has history of hypertension hyperlipidemia tobacco use and diabetes. She underwent cardiac catheterization at [facility] which showed three-vessel CAD. LV function appeared to be preserved. LVEDP was normal. The patient is felt to be a candidate for multivessel CABG by her cardiologist.  The patient is currently pain-free on IV heparin, nitroglycerin.  Her pre-CABG Doppler studies are pending. PFTs show moderate COPD. Troponins are slowly trending down. Hemoglobin A1c is 6.6  Current Activity/ Functional Status: Patient has been working as a Lawyer. She has chronic severe arthritis of the right hip. She performs her ADLs.   Zubrod Score: At the time of surgery this patient's most appropriate activity status/level should be described as: []     0    Normal activity, no symptoms [x]     1    Restricted in physical strenuous activity but ambulatory, able to do out light work []     2    Ambulatory and capable of self care, unable to do work activities, up and about                 more than 50%  Of the time                            []     3    Only limited self care, in bed greater than 50% of waking hours []     4    Completely disabled, no self care, confined to bed or chair []     5    Moribund  Past Medical History:  Diagnosis Date  . CAD (coronary artery disease)    a. NSTEMI 11/04/17; b. LHC dLM 30%, mLAD 90%, p-mLCx  80%, OM1 99%, OM2 30%, pRCA 80% mRCA 60%, dRCA 90%  . Hyperlipidemia   . Hypertension   . NSTEMI (non-ST elevated myocardial infarction) (HCC) 11/04/2017   a. TTE 11/04/2017: EF of 65-70%, no RWMA, normal LV diastolic function, normal RV cavity size and systolic function, mild TR    Past Surgical History:  Procedure Laterality Date  . BREAST CYST EXCISION Right   . CARDIAC CATHETERIZATION    . DILATION AND CURETTAGE OF UTERUS    . LAPAROSCOPIC CHOLECYSTECTOMY    . LEFT HEART CATH AND CORONARY ANGIOGRAPHY N/A 11/04/2017   Procedure: LEFT HEART CATH AND CORONARY ANGIOGRAPHY;  Surgeon: Yvonne Kendall, MD;  Location: [facility] INVASIVE CV LAB;  Service: Cardiovascular;  Laterality: N/A;    Social History   Tobacco Use  Smoking Status Current Every Day Smoker  . Packs/day: 1.00  . Years: 42.00  . Pack years: 42.00  . Types: Cigarettes  Smokeless Tobacco Never Used  Tobacco Comment   11/05/2017 "stopped smoking 11/01/2017"    Social History   Substance and Sexual Activity  Alcohol Use No    Social History   Socioeconomic  History  . Marital status: Widowed    Spouse name: Not on file  . Number of children: Not on file  . Years of education: Not on file  . Highest education level: Not on file  Social Needs  . Financial resource strain: Not on file  . Food insecurity - worry: Not on file  . Food insecurity - inability: Not on file  . Transportation needs - medical: Not on file  . Transportation needs - non-medical: Not on file  Occupational History    Employer: [employer]  Tobacco Use  . Smoking status: Current Every Day Smoker    Packs/day: 1.00    Years: 42.00    Pack years: 42.00    Types: Cigarettes  . Smokeless tobacco: Never Used  . Tobacco comment: 11/05/2017 "stopped smoking 11/01/2017"  Substance and Sexual Activity  . Alcohol use: No  . Drug use: No  . Sexual activity: No  Other Topics Concern  . Not on file  Social History Narrative  . Not on file    No  Known Allergies  Current Facility-Administered Medications  Medication Dose Route Frequency Provider Last Rate Last Dose  . acetaminophen (TYLENOL) tablet 650 mg  650 mg Oral Q4H PRN Sondra Barges, PA-C   650 mg at 11/06/17 1518  . aspirin EC tablet 81 mg  81 mg Oral Daily Eula Listen M, PA-C   81 mg at 11/06/17 1610  . atorvastatin (LIPITOR) tablet 40 mg  40 mg Oral q1800 Sondra Barges, PA-C   40 mg at 11/06/17 1702  . guaiFENesin (MUCINEX) 12 hr tablet 600 mg  600 mg Oral BID Donata Clay, Theron Arista, MD   600 mg at 11/06/17 0948  . heparin ADULT infusion 100 units/mL (25000 units/251mL sodium chloride 0.45%)  1,000 Units/hr Intravenous Continuous Hammons, Kimberly B, RPH 10 mL/hr at 11/06/17 1146 1,000 Units/hr at 11/06/17 1146  . insulin aspart (novoLOG) injection 0-5 Units  0-5 Units Subcutaneous QHS Dunn, Ryan M, PA-C      . insulin aspart (novoLOG) injection 0-9 Units  0-9 Units Subcutaneous TID WC Dunn, Ryan M, PA-C      . levalbuterol Pauline Aus) nebulizer solution 1.25 mg  1.25 mg Nebulization TID Donata Clay, Theron Arista, MD   1.25 mg at 11/06/17 1444  . metoprolol tartrate (LOPRESSOR) tablet 25 mg  25 mg Oral BID Chilton Si, MD      . mometasone-formoterol [facility]) 200-5 MCG/ACT inhaler 2 puff  2 puff Inhalation BID Donata Clay, Theron Arista, MD   2 puff at 11/06/17 [phone]  . nicotine (NICODERM CQ - dosed in mg/24 hours) patch 14 mg  14 mg Transdermal Daily Chilton Si, MD   14 mg at 11/06/17 1228  . nitroGLYCERIN (NITROSTAT) SL tablet 0.4 mg  0.4 mg Sublingual Q5 Min x 3 PRN Eula Listen M, PA-C   0.4 mg at 11/06/17 1020  . nitroGLYCERIN 50 mg in dextrose 5 % 250 mL (0.2 mg/mL) infusion  0-200 mcg/min Intravenous Titrated Robbie Lis M, PA-C 1.5 mL/hr at 11/06/17 1038 5 mcg/min at 11/06/17 1038  . ondansetron (ZOFRAN) injection 4 mg  4 mg Intravenous Q6H PRN Sondra Barges, PA-C   4 mg at 11/06/17 0420  . polyethylene glycol (MIRALAX / GLYCOLAX) packet 17 g  17 g Oral Daily PRN Chakravartti, Jaidip,  MD   17 g at 11/06/17 0955    Medications Prior to Admission  Medication Sig Dispense Refill Last Dose  . ondansetron (ZOFRAN ODT) 4 MG disintegrating  tablet Take 1 tablet (4 mg total) by mouth every 8 (eight) hours as needed for nausea or vomiting. 20 tablet 0  at PRN  . atorvastatin (LIPITOR) 80 MG tablet Take 1 tablet (80 mg total) by mouth daily at 6 PM. (Patient not taking: Reported on 11/05/2017)   Not Taking at Unknown time  . heparin 100-0.45 UNIT/ML-% infusion Inject 1,000 Units/hr into the vein continuous. (Patient not taking: Reported on 11/05/2017) 250 mL  Not Taking at Unknown time  . isosorbide mononitrate (IMDUR) 30 MG 24 hr tablet Take 1 tablet (30 mg total) by mouth daily. (Patient not taking: Reported on 11/05/2017)   Not Taking at Unknown time  . metoprolol tartrate (LOPRESSOR) 25 MG tablet Take 0.5 tablets (12.5 mg total) by mouth 2 (two) times daily. (Patient not taking: Reported on 11/05/2017)   Not Taking at Unknown time    Family History  Problem Relation Age of Onset  . Hypertension Mother      Review of Systems:   ROS      Cardiac Review of Systems: Y or  [    ]= no  Chest Pain [  yes  ]  Resting SOB [   ] Exertional SOB  [ yes ]  Orthopnea [  ]   Pedal Edema [   ]    Palpitations [  ] Syncope  [  ]   Presyncope [   ]  General Review of Systems: [Y] = yes [  ]=no Constitional: recent weight change [  ]; anorexia [  ]; fatigue Mahler.Beck  ]; nausea [  ]; night sweats [  ]; fever [  ]; or chills [  ]                                                               Dental: poor dentition[  ]; Last Dentist visit: One year   Eye : blurred vision [  ]; diplopia [   ]; vision changes [  ];  Amaurosis fugax[  ]; Resp: cough [  ];  wheezing[  ];  hemoptysis[  ]; shortness of breath[  ]; paroxysmal nocturnal dyspnea[  ]; dyspnea on exertion[yes  ]; or orthopnea[  ];  GI:  gallstones[  ], vomiting[  ];  dysphagia[  ]; melena[  ];  hematochezia [  ]; heartburn[  ];   Hx of  Colonoscopy[   ]; GU: kidney stones [  ]; hematuria[  ];   dysuria [  ];  nocturia[  ];  history of     obstruction [  ]; urinary frequency [  ]             Skin: rash, swelling[  ];, hair loss[  ];  peripheral edema[  ];  or itching[  ]; Musculosketetal: myalgias[  ];  joint swelling[  ];  joint erythema[  ];  joint pain[yes right hip  ];  back pain[  ];  Heme/Lymph: bruising[  ];  bleeding[  ];  anemia[  ];  Neuro: TIA[  ];  headaches[  ];  stroke[  ];  vertigo[  ];  seizures[  ];   paresthesias[  ];  difficulty walking[  ];  Psych:depression[  ]; anxiety[  ];  Endocrine: diabetes[yes newly diagnosed  ];  thyroid dysfunction[  ];  Immunizations: Flu [  ]; Pneumococcal[  ];    Physical Exam: BP (!) 109/50 (BP Location: Left Arm)   Pulse 63   Temp 97.6 F (36.4 C) (Oral)   Resp 18   Ht 5\' 6"  (1.676 m)   Wt 187 lb 11.2 oz (85.1 kg)   SpO2 99%   BMI 30.30 kg/m          Exam    General- alert and comfortable    Neck- no JVD, no cervical adenopathy palpable, no carotid bruit   Lungs- coarse breath sounds with scattered rhonchi but no, wheezes   Cor- regular rate and rhythm, no murmur , gallop   Abdomen- soft, non-tender   Extremities - warm, non-tender, minimal edema   Neuro- oriented, appropriate, no focal weakness   Diagnostic Studies & Laboratory data:     Recent Radiology Findings:   Chest x-ray with perihilar haziness  chest CT scan pending   I have independently reviewed the above radiologic studies.  Recent Lab Findings: Lab Results  Component Value Date   WBC 11.3 (H) 11/06/2017   HGB 12.7 11/06/2017   HCT 40.0 11/06/2017   PLT 363 11/06/2017   GLUCOSE 110 (H) 11/06/2017   CHOL 228 (H) 11/04/2017   TRIG 110 11/04/2017   HDL 34 (L) 11/04/2017   LDLCALC 172 (H) 11/04/2017   ALT 17 11/06/2017   AST 25 11/06/2017   NA 137 11/06/2017   K 4.0 11/06/2017   CL 106 11/06/2017   CREATININE 1.01 (H) 11/06/2017   BUN 10 11/06/2017   CO2 23 11/06/2017   INR 1.03 11/06/2017     HGBA1C 6.6 (H) 11/04/2017      Assessment / Plan:     Three-vessel disease with unstable angina and non-STEMI Patient would benefit from multivessel CABG Patient will be scheduled for surgery first available opening in the OR surgical schedule. Continue IV heparin and nitroglycerin until surgery. Will follow      @ME1 @ 11/06/2017 6:35 PM

## 2017-11-07 ENCOUNTER — Inpatient Hospital Stay (HOSPITAL_COMMUNITY): Payer: BLUE CROSS/BLUE SHIELD

## 2017-11-07 ENCOUNTER — Encounter (HOSPITAL_COMMUNITY): Payer: Self-pay | Admitting: Cardiovascular Disease

## 2017-11-07 DIAGNOSIS — E1169 Type 2 diabetes mellitus with other specified complication: Secondary | ICD-10-CM | POA: Diagnosis present

## 2017-11-07 DIAGNOSIS — E785 Hyperlipidemia, unspecified: Secondary | ICD-10-CM | POA: Diagnosis present

## 2017-11-07 LAB — URINALYSIS, ROUTINE W REFLEX MICROSCOPIC
Bacteria, UA: NONE SEEN
Bilirubin Urine: NEGATIVE
GLUCOSE, UA: NEGATIVE mg/dL
Ketones, ur: NEGATIVE mg/dL
Nitrite: POSITIVE — AB
PH: 5 (ref 5.0–8.0)
Protein, ur: NEGATIVE mg/dL
SPECIFIC GRAVITY, URINE: 1.038 — AB (ref 1.005–1.030)

## 2017-11-07 LAB — CBC
HCT: 38.5 % (ref 36.0–46.0)
HEMOGLOBIN: 12.4 g/dL (ref 12.0–15.0)
MCH: 30.8 pg (ref 26.0–34.0)
MCHC: 32.2 g/dL (ref 30.0–36.0)
MCV: 95.5 fL (ref 78.0–100.0)
PLATELETS: 339 10*3/uL (ref 150–400)
RBC: 4.03 MIL/uL (ref 3.87–5.11)
RDW: 12.8 % (ref 11.5–15.5)
WBC: 10.3 10*3/uL (ref 4.0–10.5)

## 2017-11-07 LAB — GLUCOSE, CAPILLARY
GLUCOSE-CAPILLARY: 104 mg/dL — AB (ref 65–99)
GLUCOSE-CAPILLARY: 104 mg/dL — AB (ref 65–99)
GLUCOSE-CAPILLARY: 114 mg/dL — AB (ref 65–99)
Glucose-Capillary: 124 mg/dL — ABNORMAL HIGH (ref 65–99)

## 2017-11-07 LAB — HEPARIN LEVEL (UNFRACTIONATED): HEPARIN UNFRACTIONATED: 0.3 [IU]/mL (ref 0.30–0.70)

## 2017-11-07 MED ORDER — CEPHALEXIN 500 MG PO CAPS: 500.0000 mg | ORAL_CAPSULE | Freq: Two times a day (BID) | ORAL | Status: DC

## 2017-11-07 MED ORDER — GUAIFENESIN-DM 100-10 MG/5ML PO SYRP: 5.0000 mL | ORAL_SOLUTION | ORAL | Status: DC | PRN

## 2017-11-07 MED ORDER — ATORVASTATIN CALCIUM 80 MG PO TABS
80.0000 mg | ORAL_TABLET | Freq: Every day | ORAL | Status: DC
  Administered 2017-11-07 – 2017-11-17 (×9): 80 mg via ORAL
  Filled 2017-11-07 (×9): qty 1

## 2017-11-07 MED ORDER — IOPAMIDOL (ISOVUE-300) INJECTION 61%
INTRAVENOUS | Status: AC
  Administered 2017-11-07: 75 mL
  Filled 2017-11-07: qty 75

## 2017-11-07 MED ORDER — CEPHALEXIN 500 MG PO CAPS
500.0000 mg | ORAL_CAPSULE | Freq: Two times a day (BID) | ORAL | Status: AC
  Administered 2017-11-07 – 2017-11-11 (×10): 500 mg via ORAL
  Filled 2017-11-07 (×10): qty 1

## 2017-11-07 MED ORDER — MAGNESIUM HYDROXIDE 400 MG/5ML PO SUSP
30.0000 mL | Freq: Every day | ORAL | Status: DC | PRN
  Administered 2017-11-07: 30 mL via ORAL
  Filled 2017-11-07: qty 30

## 2017-11-07 NOTE — Progress Notes (Signed)
ANTICOAGULATION CONSULT NOTE - Follow Up Consult  Pharmacy Consult for Heparin Indication: multivessel disease pending CABG   No Known Allergies  Patient Measurements: Height: 5\' 6"  (167.6 cm) Weight: 188 lb 9.6 oz (85.5 kg) IBW/kg (Calculated) : 59.3 Heparin Dosing Weight: 77 kg  Vital Signs: Temp: 98 F (36.7 C) (01/11 0609) Temp Source: Oral (01/11 0609) BP: 112/97 (01/11 0523)  Labs: Recent Labs    11/04/17 1802  11/05/17 1128 11/06/17 0826 11/06/17 0831 11/06/17 1401 11/06/17 1754 11/07/17 0216  HGB  --    < > 13.4 12.7  --   --   --  12.4  HCT  --   --  40.0 40.0  --   --   --  38.5  PLT  --   --  330 363  --   --   --  339  LABPROT  --   --   --  13.4  --   --   --   --   INR  --   --   --  1.03  --   --   --   --   HEPARINUNFRC  --    < > 0.39 0.81*  --   --  0.39 0.30  CREATININE  --   --   --   --  1.01*  --   --   --   TROPONINI 1.91*  --   --   --  0.98* 0.94*  --   --    < > = values in this interval not displayed.    Estimated Creatinine Clearance: 66.9 mL/min (A) (by C-G formula based on SCr of 1.01 mg/dL (H)).   Medications:  Scheduled:  . aspirin EC  81 mg Oral Daily  . atorvastatin  40 mg Oral q1800  . guaiFENesin  600 mg Oral BID  . insulin aspart  0-5 Units Subcutaneous QHS  . insulin aspart  0-9 Units Subcutaneous TID WC  . levalbuterol  1.25 mg Nebulization TID  . metoprolol tartrate  25 mg Oral BID  . mometasone-formoterol  2 puff Inhalation BID  . nicotine  14 mg Transdermal Daily   Infusions:  . heparin 1,000 Units/hr (11/06/17 1146)  . nitroGLYCERIN 5 mcg/min (11/06/17 1038)    Assessment: 57 yo F [facility] for CABG eval for multivessel CAD.  Pt continues on heparin with level at low end of goal range.  No bleeding noted.  Currently undergoing pre-CABG testing.  CABG tentatively scheduled for Wednesday 1/16.   Goal of Therapy:  Heparin level 0.3-0.7 units/ml Monitor platelets by anticoagulation  protocol: Yes   Plan:  Increase heparin to 1050 units/hr. Daily heparin level and CBC   Hilarie Sinha, Pharm.D., BCPS Clinical Pharmacist Pager: [phone] Clinical phone for 11/07/2017 from 8:30-4:00 is [phone]. After 4pm, please call Main Rx [phone]) for assistance. 11/07/2017 11:09 AM

## 2017-11-07 NOTE — Progress Notes (Signed)
Pharmacy Antibiotic Note  Caitlyn Herrera is a 57 y.o. female admitted on 11/05/2017 with UTI.  Pharmacy has been consulted for abx dosing.  Plan: Start cephalexin 500mg  PO BID x 5 days F/U cx results  Height: 5\' 6"  (167.6 cm) Weight: 188 lb 9.6 oz (85.5 kg) IBW/kg (Calculated) : 59.3  Temp (24hrs), Avg:97.8 F (36.6 C), Min:97.6 F (36.4 C), Max:98 F (36.7 C)  Recent Labs  Lab 11/04/17 0037 11/04/17 0413 11/05/17 1128 11/06/17 0826 11/06/17 0831 11/07/17 0216  WBC 10.5  --  10.1 11.3*  --  10.3  CREATININE 1.17* 1.13*  --   --  1.01*  --     Estimated Creatinine Clearance: 66.9 mL/min (A) (by C-G formula based on SCr of 1.01 mg/dL (H)).    No Known Allergies  Thank you for allowing pharmacy to be a part of this patient's care.  Enzo BiNathan Georgia Baria, PharmD, BCPS Clinical Pharmacist Pager [phone] 11/07/2017 12:58 PM

## 2017-11-07 NOTE — Progress Notes (Signed)
Progress Note  Patient Name: Caitlyn Herrera Date of Encounter: 11/07/2017  Primary Cardiologist: Yvonne Kendall, MD   Subjective   Feeling well this morning. Resting comfortably w/o CP or dyspnea. IV nitro has helped. Also complaining of urinary symptoms (frequency and dysuria).  Inpatient Medications    Scheduled Meds: . aspirin EC  81 mg Oral Daily  . atorvastatin  40 mg Oral q1800  . guaiFENesin  600 mg Oral BID  . insulin aspart  0-5 Units Subcutaneous QHS  . insulin aspart  0-9 Units Subcutaneous TID WC  . levalbuterol  1.25 mg Nebulization TID  . metoprolol tartrate  25 mg Oral BID  . mometasone-formoterol  2 puff Inhalation BID  . nicotine  14 mg Transdermal Daily   Continuous Infusions: . heparin 1,000 Units/hr (11/06/17 1146)  . nitroGLYCERIN 5 mcg/min (11/06/17 1038)   PRN Meds: acetaminophen, guaiFENesin-dextromethorphan, nitroGLYCERIN, ondansetron (ZOFRAN) IV, polyethylene glycol   Vital Signs    Vitals:   11/06/17 2359 11/07/17 0450 11/07/17 0523 11/07/17 0609  BP: (!) 127/53 (!) 99/53 (!) 112/97   Pulse:      Resp:    19  Temp:    98 F (36.7 C)  TempSrc:    Oral  SpO2:    100%  Weight:    188 lb 9.6 oz (85.5 kg)  Height:        Intake/Output Summary (Last 24 hours) at 11/07/2017 0759 Last data filed at 11/07/2017 0200 Gross per 24 hour  Intake 364.66 ml  Output 600 ml  Net -235.34 ml   Filed Weights   11/05/17 1352 11/07/17 0609  Weight: 187 lb 11.2 oz (85.1 kg) 188 lb 9.6 oz (85.5 kg)    Telemetry    NSR with some PVCs and PACs - Personally Reviewed  ECG    NSR with PACs and latera ST abnormalities - Personally Reviewed  Physical Exam   GEN: No acute distress.   Neck: No JVD Cardiac: RRR, no murmurs, rubs, or gallops.  Respiratory: Clear to auscultation bilaterally. GI: Soft, nontender, non-distended  MS: No edema; No deformity. Neuro:  Nonfocal  Psych: Normal affect   Labs    Chemistry Recent Labs  Lab  11/04/17 0037 11/04/17 0413 11/06/17 0831  NA 137 138 137  K 4.2 4.0 4.0  CL 101 104 106  CO2 26 26 23   GLUCOSE 129* 123* 110*  BUN 11 12 10   CREATININE 1.17* 1.13* 1.01*  CALCIUM 10.1 9.7 9.2  PROT  --   --  6.6  ALBUMIN  --   --  3.5  AST  --   --  25  ALT  --   --  17  ALKPHOS  --   --  76  BILITOT  --   --  0.6  GFRNONAA 50* 53* >60  GFRAA 58* >60 >60  ANIONGAP 10 8 8      Hematology Recent Labs  Lab 11/05/17 1128 11/06/17 0826 11/07/17 0216  WBC 10.1 11.3* 10.3  RBC 4.23 4.18 4.03  HGB 13.4 12.7 12.4  HCT 40.0 40.0 38.5  MCV 94.5 95.7 95.5  MCH 31.7 30.4 30.8  MCHC 33.5 31.8 32.2  RDW 12.9 12.8 12.8  PLT 330 363 339    Cardiac Enzymes Recent Labs  Lab 11/04/17 0952 11/04/17 1802 11/06/17 0831 11/06/17 1401  TROPONINI 1.67* 1.91* 0.98* 0.94*   No results for input(s): TROPIPOC in the last 168 hours.   BNPNo results for input(s): BNP, PROBNP in the  last 168 hours.   DDimer No results for input(s): DDIMER in the last 168 hours.   Radiology    No results found.  Cardiac Studies   LHC 11/05/17 Coronary Findings   Diagnostic  Dominance: Right  Left Main  Vessel was injected. Vessel is large.  Dist LM lesion 30% stenosed  Dist LM lesion is 30% stenosed.  Left Anterior Descending  Vessel was injected. Vessel is moderate in size. The vessel is severely tortuous.  Mid LAD lesion 90% stenosed  Mid LAD lesion is 90% stenosed. The lesion is thrombotic.  First Diagonal Branch  Vessel is small in size.  Second Diagonal Branch  Vessel is small in size.  Third Diagonal Branch  Vessel is small in size.  Left Circumflex  Vessel was injected. Vessel is moderate in size. The vessel is moderately tortuous.  Prox Cx to Mid Cx lesion 80% stenosed  Prox Cx to Mid Cx lesion is 80% stenosed. The lesion is irregular.  First Obtuse Marginal Branch  Vessel is moderate in size.  Lateral First Obtuse Marginal Branch  Lat 1st Mrg lesion 99% stenosed  Lat 1st  Mrg lesion is 99% stenosed.  Second Obtuse Marginal Branch  Vessel is moderate in size.  2nd Mrg lesion 30% stenosed  2nd Mrg lesion is 30% stenosed.  Right Coronary Artery  Vessel was injected. Vessel is moderate in size. The vessel is severely tortuous.  Prox RCA lesion 80% stenosed  Prox RCA lesion is 80% stenosed.  Mid RCA lesion 60% stenosed  Mid RCA lesion is 60% stenosed.  Dist RCA lesion 90% stenosed  Dist RCA lesion is 90% stenosed.  Right Posterior Descending Artery  Vessel is small in size.  Right Posterior Atrioventricular Branch  Vessel is small in size.    2D Echo 11/04/17  Study Conclusions  - Left ventricle: The cavity size was normal. Wall thickness was increased in a pattern of moderate LVH. Systolic function was vigorous. The estimated ejection fraction was in the range of 65% to 70%. Wall motion was normal; there were no regional wall motion abnormalities. Left ventricular diastolic function parameters were normal. - Right ventricle: The cavity size was normal. Systolic function was normal. - Tricuspid valve: There was mild regurgitation.   Patient Profile     Caitlyn Herrera is a 57 year old woman with history of hypertension, hyperlipidemia, newly diagnosed diabetes mellitus admitted with chest pain and elevated troponin consistent with NSTEMI. Cardiac catheterization showed severe three-vessel coronary artery disease with preserved LVEF by echo. Given her coronary anatomy and diabetes, Caitlyn Herrera has been [facility].    Assessment & Plan    1. NSTEMI/CAD: troponin peaked at 1.91 and trending downward>>0.98>>0.94. Severe multivessel CAD noted on cath 11/05/17. LVEF preserved. Plan for CABG next week with Dr. [name]. She is currently CP free on IV nitro and IV heparin. Continue ASA, statin and BB.   2. HLD: LDL elevated at 172 mg/dL. On statin therapy with Lipitor. Goal LDL is < 70 mg/dL.  Repeat FLP in 6-8 weeks. If not at goal, can add Zetia or PCSK9 inhibitor therapy as an adjunct.   3. T2DM: newly diagnosed. Hgb A1C is 6.6. She will need f/u w/ PCP at discharge for further outpatient management. Will need to later consider addition of an ACE/ARB, if BP allows post.   4. HTN: systolic BP is controlled at 112. Diastolic BP this morning elevated in the upper 90s. Metoprolol was increased yesterday  and due this morning. Also on IV nitro for unstable angina. Monitor BP.  5. Tobacco use: smoking cessation advised.  Nicoderm CQ was added yesterday.   6. Dysuria: check UA.   For questions or updates, please contact [facility] Please consult [email or web address] for contact info under Cardiology/STEMI.      Signed, Robbie Lis, PA-C  11/07/2017, 7:59 AM

## 2017-11-07 NOTE — Progress Notes (Signed)
CARDIAC REHAB PHASE I   PRE:  Rate/Rhythm: 71 SR  PACs  BP:  Supine:   Sitting: 135/79  Standing:    SaO2: 98%RA  MODE:  Ambulation: 420 ft   POST:  Rate/Rhythm: 77 SR PACs  BP:  Supine:   Sitting: 167/80  Standing:    SaO2: 99%RA 1130-1157 Pt walked 420 ft on RA with steady gait. No CP. Tolerated well. Glad to be walking.    Luetta Nuttingharlene Kathline Banbury, RN BSN  11/07/2017 11:54 AM

## 2017-11-08 ENCOUNTER — Inpatient Hospital Stay (HOSPITAL_COMMUNITY): Payer: BLUE CROSS/BLUE SHIELD

## 2017-11-08 DIAGNOSIS — Z01810 Encounter for preprocedural cardiovascular examination: Secondary | ICD-10-CM

## 2017-11-08 LAB — GLUCOSE, CAPILLARY
GLUCOSE-CAPILLARY: 130 mg/dL — AB (ref 65–99)
GLUCOSE-CAPILLARY: 147 mg/dL — AB (ref 65–99)
Glucose-Capillary: 216 mg/dL — ABNORMAL HIGH (ref 65–99)
Glucose-Capillary: 114 mg/dL — ABNORMAL HIGH (ref 65–99)

## 2017-11-08 LAB — HEPARIN LEVEL (UNFRACTIONATED)
HEPARIN UNFRACTIONATED: 0.5 [IU]/mL (ref 0.30–0.70)
Heparin Unfractionated: 0.23 IU/mL — ABNORMAL LOW (ref 0.30–0.70)

## 2017-11-08 LAB — BASIC METABOLIC PANEL
Anion gap: 11 (ref 5–15)
BUN: 13 mg/dL (ref 6–20)
CALCIUM: 9.3 mg/dL (ref 8.9–10.3)
CO2: 23 mmol/L (ref 22–32)
Chloride: 104 mmol/L (ref 101–111)
Creatinine, Ser: 1.04 mg/dL — ABNORMAL HIGH (ref 0.44–1.00)
GFR, EST NON AFRICAN AMERICAN: 58 mL/min — AB (ref >60)
Glucose, Bld: 147 mg/dL — ABNORMAL HIGH (ref 65–99)
Potassium: 4.1 mmol/L (ref 3.5–5.1)
Sodium: 138 mmol/L (ref 135–145)

## 2017-11-08 LAB — CBC
HEMATOCRIT: 39.1 % (ref 36.0–46.0)
Hemoglobin: 12.9 g/dL (ref 12.0–15.0)
MCH: 31.7 pg (ref 26.0–34.0)
MCHC: 33 g/dL (ref 30.0–36.0)
MCV: 96.1 fL (ref 78.0–100.0)
PLATELETS: 329 10*3/uL (ref 150–400)
RBC: 4.07 MIL/uL (ref 3.87–5.11)
RDW: 13 % (ref 11.5–15.5)
WBC: 14.6 10*3/uL — ABNORMAL HIGH (ref 4.0–10.5)

## 2017-11-08 LAB — VAS US DOPPLER PRE CABG
LEFT ECA DIAS: -16 cm/s
LEFT VERTEBRAL DIAS: 17 cm/s
Left CCA dist dias: -49 cm/s
Left CCA dist sys: -148 cm/s
Left CCA prox dias: 17 cm/s
Left CCA prox sys: 101 cm/s
Left ICA dist dias: -27 cm/s
Left ICA dist sys: -115 cm/s
Left ICA prox dias: -19 cm/s
Left ICA prox sys: -157 cm/s
RIGHT ECA DIAS: -32 cm/s
RIGHT VERTEBRAL DIAS: -12 cm/s
Right CCA prox dias: 21 cm/s
Right CCA prox sys: 97 cm/s
Right cca dist sys: -80 cm/s

## 2017-11-08 LAB — MAGNESIUM: Magnesium: 2.3 mg/dL (ref 1.7–2.4)

## 2017-11-08 NOTE — Progress Notes (Addendum)
ANTICOAGULATION CONSULT NOTE - Follow Up Consult  Pharmacy Consult for Heparin Indication: multivessel disease pending CABG   No Known Allergies  Patient Measurements: Height: 5\' 6"  (167.6 cm) Weight: 188 lb 9.6 oz (85.5 kg) IBW/kg (Calculated) : 59.3 Heparin Dosing Weight: 77 kg  Vital Signs: Temp: 97.9 F (36.6 C) (01/11 2100) Temp Source: Oral (01/11 2100) BP: 132/88 (01/11 2100) Pulse Rate: 72 (01/11 2100)  Labs: Recent Labs    11/06/17 0826 11/06/17 0831 11/06/17 1401 11/06/17 1754 11/07/17 0216 11/08/17 0306  HGB 12.7  --   --   --  12.4 12.9  HCT 40.0  --   --   --  38.5 39.1  PLT 363  --   --   --  339 329  LABPROT 13.4  --   --   --   --   --   INR 1.03  --   --   --   --   --   HEPARINUNFRC 0.81*  --   --  0.39 0.30 0.23*  CREATININE  --  1.01*  --   --   --   --   TROPONINI  --  0.98* 0.94*  --   --   --     Estimated Creatinine Clearance: 66.9 mL/min (A) (by C-G formula based on SCr of 1.01 mg/dL (H)).   Medications:  Scheduled:  . aspirin EC  81 mg Oral Daily  . atorvastatin  80 mg Oral q1800  . cephALEXin  500 mg Oral Q12H  . guaiFENesin  600 mg Oral BID  . insulin aspart  0-5 Units Subcutaneous QHS  . insulin aspart  0-9 Units Subcutaneous TID WC  . levalbuterol  1.25 mg Nebulization TID  . metoprolol tartrate  25 mg Oral BID  . mometasone-formoterol  2 puff Inhalation BID  . nicotine  14 mg Transdermal Daily   Infusions:  . heparin 1,050 Units/hr (11/07/17 1200)  . nitroGLYCERIN 5 mcg/min (11/06/17 1038)   Assessment: 57 yo F transferred for [facility] for CABG eval for multivessel CAD. Currently undergoing pre-CABG testing.  CABG tentatively scheduled for Wednesday 1/16.   Heparin level subtherapeutic: 0.23. No infusion issues or overt bleeding reported by RN.  Goal of Therapy:  Heparin level 0.3-0.7 units/ml Monitor platelets by anticoagulation protocol: Yes   Plan:  Increase heparin to 1250 units/hr. 8 hour heparin  level Daily heparin level and CBC Monitor for s/sx of bleeding  Ruben Imony Tilley Faeth, PharmD Clinical Pharmacist 11/08/2017 4:03 AM

## 2017-11-08 NOTE — Progress Notes (Signed)
Progress Note  Patient Name: Caitlyn Herrera Date of Encounter: 11/08/2017  Primary Cardiologist: Yvonne Kendall, MD   Subjective   No chest pain or sob.   Inpatient Medications    Scheduled Meds: . aspirin EC  81 mg Oral Daily  . atorvastatin  80 mg Oral q1800  . cephALEXin  500 mg Oral Q12H  . guaiFENesin  600 mg Oral BID  . insulin aspart  0-5 Units Subcutaneous QHS  . insulin aspart  0-9 Units Subcutaneous TID WC  . levalbuterol  1.25 mg Nebulization TID  . metoprolol tartrate  25 mg Oral BID  . mometasone-formoterol  2 puff Inhalation BID  . nicotine  14 mg Transdermal Daily   Continuous Infusions: . heparin 1,250 Units/hr (11/08/17 0508)  . nitroGLYCERIN 5 mcg/min (11/06/17 1038)   PRN Meds: acetaminophen, guaiFENesin-dextromethorphan, magnesium hydroxide, nitroGLYCERIN, ondansetron (ZOFRAN) IV, polyethylene glycol   Vital Signs    Vitals:   11/07/17 2035 11/07/17 2100 11/08/17 0500 11/08/17 0926  BP:  132/88 (!) 109/53   Pulse:  72 61   Resp:  16 16   Temp:  97.9 F (36.6 C) 98.5 F (36.9 C)   TempSrc:  Oral Oral   SpO2: 99% 100% 100% 100%  Weight:   188 lb 8 oz (85.5 kg)   Height:        Intake/Output Summary (Last 24 hours) at 11/08/2017 0944 Last data filed at 11/08/2017 0700 Gross per 24 hour  Intake 586.94 ml  Output -  Net 586.94 ml   Filed Weights   11/05/17 1352 11/07/17 0609 11/08/17 0500  Weight: 187 lb 11.2 oz (85.1 kg) 188 lb 9.6 oz (85.5 kg) 188 lb 8 oz (85.5 kg)    Telemetry    nsr with PAC's - Personally Reviewed  ECG    none - Personally Reviewed  Physical Exam   GEN: No acute distress.   Neck: 6 cm JVD Cardiac: RRR, no murmurs, rubs, or gallops.  Respiratory: Clear to auscultation bilaterally. GI: Soft, nontender, non-distended  MS: No edema; No deformity. Neuro:  Nonfocal  Psych: Normal affect   Labs    Chemistry Recent Labs  Lab 11/04/17 0413 11/06/17 0831 11/08/17 0306  NA 138 137 138  K 4.0 4.0  4.1  CL 104 106 104  CO2 26 23 23   GLUCOSE 123* 110* 147*  BUN 12 10 13   CREATININE 1.13* 1.01* 1.04*  CALCIUM 9.7 9.2 9.3  PROT  --  6.6  --   ALBUMIN  --  3.5  --   AST  --  25  --   ALT  --  17  --   ALKPHOS  --  76  --   BILITOT  --  0.6  --   GFRNONAA 53* >60 58*  GFRAA >60 >60 >60  ANIONGAP 8 8 11      Hematology Recent Labs  Lab 11/06/17 0826 11/07/17 0216 11/08/17 0306  WBC 11.3* 10.3 14.6*  RBC 4.18 4.03 4.07  HGB 12.7 12.4 12.9  HCT 40.0 38.5 39.1  MCV 95.7 95.5 96.1  MCH 30.4 30.8 31.7  MCHC 31.8 32.2 33.0  RDW 12.8 12.8 13.0  PLT 363 339 329    Cardiac Enzymes Recent Labs  Lab 11/04/17 0952 11/04/17 1802 11/06/17 0831 11/06/17 1401  TROPONINI 1.67* 1.91* 0.98* 0.94*   No results for input(s): TROPIPOC in the last 168 hours.   BNPNo results for input(s): BNP, PROBNP in the last 168 hours.   DDimer  No results for input(s): DDIMER in the last 168 hours.   Radiology    Ct Chest W Contrast  Result Date: 11/07/2017 CLINICAL DATA:  Chest pain and shortness of breath. EXAM: CT CHEST WITH CONTRAST TECHNIQUE: Multidetector CT imaging of the chest was performed during intravenous contrast administration. CONTRAST:  75mL ISOVUE-300 IOPAMIDOL (ISOVUE-300) INJECTION 61% COMPARISON:  Chest x-ray dated November 04, 2017. FINDINGS: Cardiovascular: No significant vascular findings. Normal heart size. No pericardial effusion. Normal caliber thoracic aorta. Coronary, aortic arch, and branch vessel atherosclerotic vascular disease. Mediastinum/Nodes: Prominent right hilar lymph nodes are not enlarged by CT size criteria. No mediastinal or axillary lymphadenopathy. The thyroid gland trachea, and esophagus demonstrate no significant abnormalities. Lungs/Pleura: Mild centrilobular and paraseptal emphysema. Mild central peribronchial thickening. There is a 5 mm solid nodule in the right lung apex (series 7, image 17). Mild dependent bibasilar atelectasis. Subtle mosaic  attenuation in the bilateral lower lobes and right middle lobe. No focal consolidation, pleural effusion, or pneumothorax. Upper Abdomen: No acute abnormality. Musculoskeletal: No chest wall abnormality. No acute or significant osseous findings. IMPRESSION: 1.  No acute intrathoracic process. 2. Subtle mosaic attenuation in the bilateral lower lobes and right middle lobe may reflect small airways disease. 3. 5 mm pulmonary nodule in the right lung apex. No follow-up needed if patient is low-risk. Non-contrast chest CT can be considered in 12 months if patient is high-risk. This recommendation follows the consensus statement: Guidelines for Management of Incidental Pulmonary Nodules Detected on CT Images: From the Fleischner Society 2017; Radiology 2017; 284:228-243. 4.  Emphysema (ICD10-J43.9). 5.  Aortic atherosclerosis (ICD10-I70.0). Electronically Signed   By: Obie DredgeWilliam T Derry M.D.   On: 11/07/2017 10:23    Cardiac Studies   Cath results reviewed  Patient Profile     57 y.o. female admitted with NSTEMI and found to have 3 vessel CAD for CABG next week.  Assessment & Plan    1. NSTEMI - she is pain free. Continue current meds. For CABG next week.  2. Dyslipidemia - continue high dose statin therapy. 3. DM - on insulin.   For questions or updates, please contact [facility] Please consult [email or web address] for contact info under Cardiology/STEMI.      Signed, Lewayne BuntingGregg Taylor, MD  11/08/2017, 9:44 AM  Patient ID: Caitlyn Herrera, female   DOB: January 21, 1961, 57 y.o.   MRN: [id number]

## 2017-11-08 NOTE — Progress Notes (Signed)
CARDIAC REHAB PHASE I   PRE:  Rate/Rhythm: 65 SR with frequent PAC's  BP:  Supine: 130/67  Sitting:   Standing:    SaO2: 99 RA  MODE:  Ambulation: 470 ft   POST:  Rate/Rhythm: 76 SR with PAC's  BP:  Supine:   Sitting: 151/91  Standing:    SaO2: 100 RA 1225-1250 Pt tolerated ambulation well without c/o of cp or SOB. VS stable. Pt back to side of bad after walk with call light in reach.   Melina CopaLisa Valen Gillison RN 11/08/2017 12:48 PM

## 2017-11-08 NOTE — Plan of Care (Signed)
Pt had a pizza from [employer] and store bought cupcakes in her room. Writer educated pt on why she needs insulin. Pt states she is not diabetic and refused her insulin stating her hgbA1C is only high because she has been eating bad recently. Writer explained that the blood work shows a range of how she has been eating over the last 3 months and that if she continues to eat an unhealthy diet she will become dependent on insulin along other complications of diabetes such as poor circulation, poor healing etc. Pt states is going to eat healthier foods.

## 2017-11-08 NOTE — Progress Notes (Signed)
Pre-op Cardiac Surgery  Carotid Findings:   1-39% ICA stenosis. Vertebral artery flow is antegrade.                                   Technically difficult studies secondary to heart rhythm                                     Bradycardia.  Upper Extremity Right Left  Brachial Pressures 128 118  Radial Waveforms    Ulnar Waveforms    Palmar Arch (Allen's Test) WNL WNL   Findings:      Lower  Extremity Right Left  Dorsalis Pedis    Anterior Tibial 64 88  Posterior Tibial 93 96  Ankle/Brachial Indices 0.73 0.75    Findings:  ABIs indicate moderate reduction in arterial blood flow.

## 2017-11-08 NOTE — Progress Notes (Signed)
ANTICOAGULATION CONSULT NOTE - Follow Up Consult  Pharmacy Consult for Heparin Indication: multivessel disease pending CABG   No Known Allergies  Patient Measurements: Height: 5\' 6"  (167.6 cm) Weight: 188 lb 8 oz (85.5 kg) IBW/kg (Calculated) : 59.3 Heparin Dosing Weight: 77 kg  Vital Signs: Temp: 98.5 F (36.9 C) (01/12 0500) Temp Source: Oral (01/12 0500) BP: 135/104 (01/12 0946) Pulse Rate: 79 (01/12 0946)  Labs: Recent Labs    11/06/17 0826 11/06/17 0831 11/06/17 1401  11/07/17 0216 11/08/17 0306 11/08/17 1112  HGB 12.7  --   --   --  12.4 12.9  --   HCT 40.0  --   --   --  38.5 39.1  --   PLT 363  --   --   --  339 329  --   LABPROT 13.4  --   --   --   --   --   --   INR 1.03  --   --   --   --   --   --   HEPARINUNFRC 0.81*  --   --    < > 0.30 0.23* 0.50  CREATININE  --  1.01*  --   --   --  1.04*  --   TROPONINI  --  0.98* 0.94*  --   --   --   --    < > = values in this interval not displayed.   Estimated Creatinine Clearance: 65 mL/min (A) (by C-G formula based on SCr of 1.04 mg/dL (H)).  Assessment: [age] yoF [facility] CABG eval for multivessel CAD. CABG tentatively scheduled 1/16, continues on IV heparin. Heparin level therapeutic at 0.50 after rate increase this AM. Hgb, pltc WNL. No bleeding noted.  Goal of Therapy:  Heparin level 0.3-0.7 units/ml Monitor platelets by anticoagulation protocol: Yes   Plan:  Continue heparin gtt at 1250 units/hr Daily heparin level and CBC Monitor for s/sx of bleeding  Erin N. Zigmund Danieleja, PharmD PGY1 Pharmacy Resident Pager: [phone] 11/08/2017 12:48 PM

## 2017-11-09 LAB — CBC
HCT: 40.2 % (ref 36.0–46.0)
Hemoglobin: 12.9 g/dL (ref 12.0–15.0)
MCH: 31.2 pg (ref 26.0–34.0)
MCHC: 32.1 g/dL (ref 30.0–36.0)
MCV: 97.1 fL (ref 78.0–100.0)
PLATELETS: 358 10*3/uL (ref 150–400)
RBC: 4.14 MIL/uL (ref 3.87–5.11)
RDW: 13.5 % (ref 11.5–15.5)
WBC: 13.1 10*3/uL — AB (ref 4.0–10.5)

## 2017-11-09 LAB — GLUCOSE, CAPILLARY
GLUCOSE-CAPILLARY: 134 mg/dL — AB (ref 65–99)
GLUCOSE-CAPILLARY: 122 mg/dL — AB (ref 65–99)
GLUCOSE-CAPILLARY: 129 mg/dL — AB (ref 65–99)
Glucose-Capillary: 129 mg/dL — ABNORMAL HIGH (ref 65–99)

## 2017-11-09 LAB — BASIC METABOLIC PANEL
ANION GAP: 9 (ref 5–15)
BUN: 9 mg/dL (ref 6–20)
CO2: 24 mmol/L (ref 22–32)
Calcium: 9.4 mg/dL (ref 8.9–10.3)
Chloride: 105 mmol/L (ref 101–111)
Creatinine, Ser: 1.12 mg/dL — ABNORMAL HIGH (ref 0.44–1.00)
GFR calc non Af Amer: 53 mL/min — ABNORMAL LOW (ref >60)
Glucose, Bld: 146 mg/dL — ABNORMAL HIGH (ref 65–99)
POTASSIUM: 4 mmol/L (ref 3.5–5.1)
SODIUM: 138 mmol/L (ref 135–145)

## 2017-11-09 LAB — HEPARIN LEVEL (UNFRACTIONATED): Heparin Unfractionated: 0.44 IU/mL (ref 0.30–0.70)

## 2017-11-09 NOTE — Progress Notes (Signed)
ANTICOAGULATION CONSULT NOTE - Follow Up Consult  Pharmacy Consult for Heparin Indication: multivessel disease pending CABG   No Known Allergies  Patient Measurements: Height: 5\' 6"  (167.6 cm) Weight: 191 lb 3.2 oz (86.7 kg) IBW/kg (Calculated) : 59.3 Heparin Dosing Weight: 77 kg  Vital Signs: Temp: 98.1 F (36.7 C) (01/13 0607) Temp Source: Oral (01/13 0607) BP: 150/62 (01/13 0607) Pulse Rate: 62 (01/13 0607)  Labs: Recent Labs    11/06/17 1401  11/07/17 0216 11/08/17 0306 11/08/17 1112 11/09/17 0823  HGB  --    < > 12.4 12.9  --  12.9  HCT  --   --  38.5 39.1  --  40.2  PLT  --   --  339 329  --  358  HEPARINUNFRC  --    < > 0.30 0.23* 0.50 0.44  CREATININE  --   --   --  1.04*  --  1.12*  TROPONINI 0.94*  --   --   --   --   --    < > = values in this interval not displayed.   Estimated Creatinine Clearance: 60.8 mL/min (A) (by C-G formula based on SCr of 1.12 mg/dL (H)).  Assessment: [age] yoF [facility] for CABG eval for multivessel CAD. CABG tentatively scheduled 1/16, continues on IV heparin. Heparin level remains therapeutic this AM at 0.44. Hgb, pltc stable WNL. No bleeding noted.  Goal of Therapy:  Heparin level 0.3-0.7 units/ml Monitor platelets by anticoagulation protocol: Yes   Plan:  Continue heparin gtt at 1250 units/hr Daily heparin level and CBC Monitor for s/sx of bleeding  Charlene Detter N. Zigmund Danieleja, PharmD PGY1 Pharmacy Resident Pager: [phone] 11/09/2017 10:46 AM

## 2017-11-09 NOTE — Progress Notes (Addendum)
Progress Note  Patient Name: Caitlyn Herrera Date of Encounter: 11/09/2017  Primary Cardiologist: Yvonne Kendallhristopher End, MD   Subjective   Denies chest pain or sob. Wants to go home.  Inpatient Medications    Scheduled Meds: . aspirin EC  81 mg Oral Daily  . atorvastatin  80 mg Oral q1800  . cephALEXin  500 mg Oral Q12H  . guaiFENesin  600 mg Oral BID  . insulin aspart  0-5 Units Subcutaneous QHS  . insulin aspart  0-9 Units Subcutaneous TID WC  . levalbuterol  1.25 mg Nebulization TID  . metoprolol tartrate  25 mg Oral BID  . mometasone-formoterol  2 puff Inhalation BID  . nicotine  14 mg Transdermal Daily   Continuous Infusions: . heparin 1,250 Units/hr (11/09/17 0739)  . nitroGLYCERIN 5 mcg/min (11/06/17 1038)   PRN Meds: acetaminophen, guaiFENesin-dextromethorphan, magnesium hydroxide, nitroGLYCERIN, ondansetron (ZOFRAN) IV, polyethylene glycol   Vital Signs    Vitals:   11/08/17 2049 11/08/17 2204 11/09/17 0607 11/09/17 0855  BP: 135/76 134/64 (!) 150/62   Pulse: 73 73 62   Resp: 18  17   Temp: 97.6 F (36.4 C)  98.1 F (36.7 C)   TempSrc: Oral  Oral   SpO2: 100%  100% 98%  Weight:   191 lb 3.2 oz (86.7 kg)   Height:        Intake/Output Summary (Last 24 hours) at 11/09/2017 0946 Last data filed at 11/09/2017 0400 Gross per 24 hour  Intake 774 ml  Output -  Net 774 ml   Filed Weights   11/07/17 0609 11/08/17 0500 11/09/17 0607  Weight: 188 lb 9.6 oz (85.5 kg) 188 lb 8 oz (85.5 kg) 191 lb 3.2 oz (86.7 kg)    Telemetry    nsr - Personally Reviewed  ECG    none - Personally Reviewed  Physical Exam   GEN: No acute distress.   Neck: 6 cm JVD Cardiac: RRR, no murmurs, rubs, or gallops.  Respiratory: Clear to auscultation bilaterally. GI: Soft, nontender, non-distended  MS: No edema; No deformity. Neuro:  Nonfocal  Psych: Normal affect   Labs    Chemistry Recent Labs  Lab 11/04/17 0413 11/06/17 0831 11/08/17 0306  NA 138 137 138  K  4.0 4.0 4.1  CL 104 106 104  CO2 26 23 23   GLUCOSE 123* 110* 147*  BUN 12 10 13   CREATININE 1.13* 1.01* 1.04*  CALCIUM 9.7 9.2 9.3  PROT  --  6.6  --   ALBUMIN  --  3.5  --   AST  --  25  --   ALT  --  17  --   ALKPHOS  --  76  --   BILITOT  --  0.6  --   GFRNONAA 53* >60 58*  GFRAA >60 >60 >60  ANIONGAP 8 8 11      Hematology Recent Labs  Lab 11/06/17 0826 11/07/17 0216 11/08/17 0306  WBC 11.3* 10.3 14.6*  RBC 4.18 4.03 4.07  HGB 12.7 12.4 12.9  HCT 40.0 38.5 39.1  MCV 95.7 95.5 96.1  MCH 30.4 30.8 31.7  MCHC 31.8 32.2 33.0  RDW 12.8 12.8 13.0  PLT 363 339 329    Cardiac Enzymes Recent Labs  Lab 11/04/17 0952 11/04/17 1802 11/06/17 0831 11/06/17 1401  TROPONINI 1.67* 1.91* 0.98* 0.94*   No results for input(s): TROPIPOC in the last 168 hours.   BNPNo results for input(s): BNP, PROBNP in the last 168 hours.  DDimer No results for input(s): DDIMER in the last 168 hours.   Radiology    Ct Chest W Contrast  Result Date: 11/07/2017 CLINICAL DATA:  Chest pain and shortness of breath. EXAM: CT CHEST WITH CONTRAST TECHNIQUE: Multidetector CT imaging of the chest was performed during intravenous contrast administration. CONTRAST:  75mL ISOVUE-300 IOPAMIDOL (ISOVUE-300) INJECTION 61% COMPARISON:  Chest x-ray dated November 04, 2017. FINDINGS: Cardiovascular: No significant vascular findings. Normal heart size. No pericardial effusion. Normal caliber thoracic aorta. Coronary, aortic arch, and branch vessel atherosclerotic vascular disease. Mediastinum/Nodes: Prominent right hilar lymph nodes are not enlarged by CT size criteria. No mediastinal or axillary lymphadenopathy. The thyroid gland trachea, and esophagus demonstrate no significant abnormalities. Lungs/Pleura: Mild centrilobular and paraseptal emphysema. Mild central peribronchial thickening. There is a 5 mm solid nodule in the right lung apex (series 7, image 17). Mild dependent bibasilar atelectasis. Subtle mosaic  attenuation in the bilateral lower lobes and right middle lobe. No focal consolidation, pleural effusion, or pneumothorax. Upper Abdomen: No acute abnormality. Musculoskeletal: No chest wall abnormality. No acute or significant osseous findings. IMPRESSION: 1.  No acute intrathoracic process. 2. Subtle mosaic attenuation in the bilateral lower lobes and right middle lobe may reflect small airways disease. 3. 5 mm pulmonary nodule in the right lung apex. No follow-up needed if patient is low-risk. Non-contrast chest CT can be considered in 12 months if patient is high-risk. This recommendation follows the consensus statement: Guidelines for Management of Incidental Pulmonary Nodules Detected on CT Images: From the Fleischner Society 2017; Radiology 2017; 284:228-243. 4.  Emphysema (ICD10-J43.9). 5.  Aortic atherosclerosis (ICD10-I70.0). Electronically Signed   By: Obie Dredge M.D.   On: 11/07/2017 10:23    Cardiac Studies   none  Patient Profile     57 y.o. female admitted with [place], s/p LHC with 3 vessel CAD for CABG  Assessment & Plan    1. CAD/NSTEMI- she denies anginal symptoms but is at bed rest. CABG scheduled for Wednesday. 2. Obesity - she will be encouraged to lose weight in recovery 3. Dyslipidemia - she will continue high dose statin therapy.  For questions or updates, please contact [facility] Please consult [email or web address] for contact info under Cardiology/STEMI.      Signed, Lewayne Bunting, MD  11/09/2017, 9:46 AM  Patient ID: Caitlyn Robson, female   DOB: 07/10/1961, 57 y.o.   MRN: [id number]

## 2017-11-10 DIAGNOSIS — E119 Type 2 diabetes mellitus without complications: Secondary | ICD-10-CM

## 2017-11-10 DIAGNOSIS — I2510 Atherosclerotic heart disease of native coronary artery without angina pectoris: Secondary | ICD-10-CM | POA: Diagnosis present

## 2017-11-10 DIAGNOSIS — I119 Hypertensive heart disease without heart failure: Secondary | ICD-10-CM | POA: Diagnosis present

## 2017-11-10 DIAGNOSIS — N390 Urinary tract infection, site not specified: Secondary | ICD-10-CM | POA: Diagnosis not present

## 2017-11-10 DIAGNOSIS — R911 Solitary pulmonary nodule: Secondary | ICD-10-CM | POA: Diagnosis present

## 2017-11-10 LAB — CBC
HEMATOCRIT: 40.7 % (ref 36.0–46.0)
HEMOGLOBIN: 13.3 g/dL (ref 12.0–15.0)
MCH: 31.7 pg (ref 26.0–34.0)
MCHC: 32.7 g/dL (ref 30.0–36.0)
MCV: 97.1 fL (ref 78.0–100.0)
Platelets: 355 10*3/uL (ref 150–400)
RBC: 4.19 MIL/uL (ref 3.87–5.11)
RDW: 13.4 % (ref 11.5–15.5)
WBC: 15.3 10*3/uL — ABNORMAL HIGH (ref 4.0–10.5)

## 2017-11-10 LAB — GLUCOSE, CAPILLARY
GLUCOSE-CAPILLARY: 136 mg/dL — AB (ref 65–99)
Glucose-Capillary: 123 mg/dL — ABNORMAL HIGH (ref 65–99)
Glucose-Capillary: 108 mg/dL — ABNORMAL HIGH (ref 65–99)
Glucose-Capillary: 119 mg/dL — ABNORMAL HIGH (ref 65–99)

## 2017-11-10 LAB — BASIC METABOLIC PANEL
Anion gap: 9 (ref 5–15)
BUN: 9 mg/dL (ref 6–20)
CHLORIDE: 105 mmol/L (ref 101–111)
CO2: 24 mmol/L (ref 22–32)
CREATININE: 1 mg/dL (ref 0.44–1.00)
Calcium: 9.6 mg/dL (ref 8.9–10.3)
GFR calc Af Amer: >60 mL/min (ref >60)
GFR calc non Af Amer: >60 mL/min (ref >60)
GLUCOSE: 129 mg/dL — AB (ref 65–99)
Potassium: 4 mmol/L (ref 3.5–5.1)
Sodium: 138 mmol/L (ref 135–145)

## 2017-11-10 LAB — HEPARIN LEVEL (UNFRACTIONATED): Heparin Unfractionated: 0.5 IU/mL (ref 0.30–0.70)

## 2017-11-10 MED ORDER — LEVALBUTEROL HCL 1.25 MG/0.5ML IN NEBU: 1.2500 mg | INHALATION_SOLUTION | Freq: Four times a day (QID) | RESPIRATORY_TRACT | Status: DC | PRN

## 2017-11-10 MED ORDER — TRAMADOL HCL 50 MG PO TABS
50.0000 mg | ORAL_TABLET | Freq: Four times a day (QID) | ORAL | Status: DC | PRN
Start: 2017-11-10 — End: 2017-11-12
  Administered 2017-11-10: 50 mg via ORAL
  Filled 2017-11-10: qty 1

## 2017-11-10 NOTE — Progress Notes (Signed)
Progress Note  Patient Name: Caitlyn Herrera Date of Encounter: 11/10/2017  Primary Cardiologist: Yvonne Kendallhristopher End, MD   Subjective   No chest pain overnight  Inpatient Medications    Scheduled Meds: . aspirin EC  81 mg Oral Daily  . atorvastatin  80 mg Oral q1800  . cephALEXin  500 mg Oral Q12H  . guaiFENesin  600 mg Oral BID  . insulin aspart  0-5 Units Subcutaneous QHS  . insulin aspart  0-9 Units Subcutaneous TID WC  . levalbuterol  1.25 mg Nebulization TID  . metoprolol tartrate  25 mg Oral BID  . mometasone-formoterol  2 puff Inhalation BID  . nicotine  14 mg Transdermal Daily   Continuous Infusions: . heparin 1,250 Units/hr (11/09/17 0739)  . nitroGLYCERIN 5 mcg/min (11/06/17 1038)   PRN Meds: acetaminophen, guaiFENesin-dextromethorphan, magnesium hydroxide, nitroGLYCERIN, ondansetron (ZOFRAN) IV, polyethylene glycol   Vital Signs    Vitals:   11/09/17 1508 11/09/17 2144 11/09/17 2214 11/10/17 0636  BP: 129/68 (!) 142/87  127/80  Pulse: 64 (!) 58  70  Resp:  18  17  Temp: (!) 97.4 F (36.3 C) 98.1 F (36.7 C)  98.3 F (36.8 C)  TempSrc: Oral Oral  Oral  SpO2: 98% 100% 100% 99%  Weight:    188 lb 12.8 oz (85.6 kg)  Height:        Intake/Output Summary (Last 24 hours) at 11/10/2017 0827 Last data filed at 11/09/2017 1925 Gross per 24 hour  Intake 994 ml  Output -  Net 994 ml   Filed Weights   11/08/17 0500 11/09/17 0607 11/10/17 0636  Weight: 188 lb 8 oz (85.5 kg) 191 lb 3.2 oz (86.7 kg) 188 lb 12.8 oz (85.6 kg)    Telemetry    NSR frequent PACs- Personally Reviewed  ECG    NSR, Q in V2 - Personally Reviewed  Physical Exam   GEN: No acute distress.   Neck: No JVD Cardiac: RRR, no murmurs, rubs, or gallops.  Respiratory: Clear to auscultation bilaterally. GI: Soft, nontender, non-distended  MS: No edema; No deformity. Neuro:  Nonfocal  Psych: Normal affect   Labs    Chemistry Recent Labs  Lab 11/06/17 0831 11/08/17 0306  11/09/17 0823 11/10/17 0530  NA 137 138 138 138  K 4.0 4.1 4.0 4.0  CL 106 104 105 105  CO2 23 23 24 24   GLUCOSE 110* 147* 146* 129*  BUN 10 13 9 9   CREATININE 1.01* 1.04* 1.12* 1.00  CALCIUM 9.2 9.3 9.4 9.6  PROT 6.6  --   --   --   ALBUMIN 3.5  --   --   --   AST 25  --   --   --   ALT 17  --   --   --   ALKPHOS 76  --   --   --   BILITOT 0.6  --   --   --   GFRNONAA >60 58* 53* >60  GFRAA >60 >60 >60 >60  ANIONGAP 8 11 9 9      Hematology Recent Labs  Lab 11/08/17 0306 11/09/17 0823 11/10/17 0530  WBC 14.6* 13.1* 15.3*  RBC 4.07 4.14 4.19  HGB 12.9 12.9 13.3  HCT 39.1 40.2 40.7  MCV 96.1 97.1 97.1  MCH 31.7 31.2 31.7  MCHC 33.0 32.1 32.7  RDW 13.0 13.5 13.4  PLT 329 358 355    Cardiac Enzymes Recent Labs  Lab 11/04/17 0952 11/04/17 1802 11/06/17 0831 11/06/17  1401  TROPONINI 1.67* 1.91* 0.98* 0.94*   No results for input(s): TROPIPOC in the last 168 hours.   BNPNo results for input(s): BNP, PROBNP in the last 168 hours.   DDimer No results for input(s): DDIMER in the last 168 hours.   Radiology   Chest CT 11/07/17- IMPRESSION: 1.  No acute intrathoracic process. 2. Subtle mosaic attenuation in the bilateral lower lobes and right middle lobe may reflect small airways disease. 3. 5 mm pulmonary nodule in the right lung apex. No follow-up needed if patient is low-risk. Non-contrast chest CT can be considered in 12 months if patient is high-risk. This recommendation follows the consensus statement: Guidelines for Management of Incidental Pulmonary Nodules Detected on CT Images: From the Fleischner Society 2017; Radiology 2017; 284:228-243. 4.  Emphysema (ICD10-J43.9). 5.  Aortic atherosclerosis (ICD10-I70.0).   Cardiac Studies   Echo 11/04/17- Study Conclusions  - Left ventricle: The cavity size was normal. Wall thickness was   increased in a pattern of moderate LVH. Systolic function was   vigorous. The estimated ejection fraction was in the  range of 65%   to 70%. Wall motion was normal; there were no regional wall   motion abnormalities. Left ventricular diastolic function   parameters were normal. - Right ventricle: The cavity size was normal. Systolic function   was normal. - Tricuspid valve: There was mild regurgitation.   Patient Profile     57 y.o.AA female from [place] with a history of HTN, HLD, and new onset NIDDM admitted 11/04/17 to [facility] with a NSTEMI. Cath showed 3V CAD and normal LVF. Pt was transferred to [facility] 11/05/17 for further evaluation. Plan is for CABG.   Assessment & Plan    NSTEMI- Admitted 11/04/17- Troponin peak 1.91  3V CAD- Pt is for CABG 11/12/17  NIDDM- New diagnosis for her (although pt is not convinced of this). She is on a sliding scale, consider adding Metformin  HLD- LDL 172 on admission- she is on high dose statin now.  HTN-HCVD Normal LVF, moderate LVH on echo  PVD- Mild bilateral ICA disease and moderate LEA disease by pre CABG doppler.   Pulmonary nodule on CT She will need f/u with her PCP for this  UTI- On Keflex  Plan: Continue IV Heparin and NTG. CABG planned for 1/16. Will ask for diabetic teaching consult as pt had pizza and cupcakes this weekend.    For questions or updates, please contact [facility] Please consult [email or web address] for contact info under Cardiology/STEMI.      Signed, Corine Shelter, PA-C  11/10/2017, 8:27 AM    Patient seen and examined  I agree with findings of L Kilroy above Pt complains of headache but no chest pain Lungs :  CTA  Cardiac RRR  No murmur s  Ext without edema  Will continue heparin  D/c NTG (on 1.5 now)    Plan for surgery on Wed.   Dietrich Pates

## 2017-11-10 NOTE — Care Management Note (Signed)
Case Management Note  Patient Details  Name: Caitlyn Herrera MRN: [id number] Date of Birth: October 17, 1961  Subjective/Objective:  Pt presented for Nstemi post cath at [facility] 11-04-17 that revealed 3 vessel CAD. [facility]- plan for CABG 11-11-16. PT has support of daughter and she will transition to her mothers home once stable.                   Action/Plan: Pt had questions in regards to Disability- CM did make her aware to contact Social Services for assistance. CM will continue to monitor for additional needs.   Expected Discharge Date:                  Expected Discharge Plan:  Home w Home Health Services  In-House Referral:     Discharge planning Services  CM Consult  Post Acute Care Choice:    Choice offered to:     DME Arranged:    DME Agency:     HH Arranged:    HH Agency:     Status of Service:  In process, will continue to follow  If discussed at Long Length of Stay Meetings, dates discussed:    Additional Comments:  Gala LewandowskyGraves-Bigelow, Tarae Wooden Kaye, RN 11/10/2017, 4:36 PM

## 2017-11-10 NOTE — Progress Notes (Addendum)
ANTICOAGULATION CONSULT NOTE - Follow Up Consult  Pharmacy Consult for Heparin Indication: chest pain/ACS/CAD awaiting CABG  No Known Allergies  Patient Measurements: Height: 5\' 6"  (167.6 cm) Weight: 188 lb 12.8 oz (85.6 kg) IBW/kg (Calculated) : 59.3 Heparin Dosing Weight: 77 kg  Vital Signs: Temp: 98.5 F (36.9 C) (01/14 1216) Temp Source: Oral (01/14 1216) BP: 149/65 (01/14 1216) Pulse Rate: 61 (01/14 1216)  Labs: Recent Labs    11/08/17 0306 11/08/17 1112 11/09/17 0823 11/10/17 0530  HGB 12.9  --  12.9 13.3  HCT 39.1  --  40.2 40.7  PLT 329  --  358 355  HEPARINUNFRC 0.23* 0.50 0.44 0.50  CREATININE 1.04*  --  1.12* 1.00    Estimated Creatinine Clearance: 67.6 mL/min (by C-G formula based on SCr of 1 mg/dL).  Assessment:  57 yr old female admitted to [facility] 1/8 with NSTEMI; cath showed 3V CAD. [facility];  CABG planned on 1/16.    Heparin level remains therapeutic (0.50) on 1250 units/hr.     Also on Day # 4 of 5 Cephalexin 500 mg BID x 5 days for UTI.   TNTC WBCs on UA 1/11, but no bacteria seen, no culture done.  Goal of Therapy:  Heparin level 0.3-0.7 units/ml Monitor platelets by anticoagulation protocol: Yes   Plan:   Continue heparin drip at 1250 units/hr.  Daily heparin level and CBC while on heparin.  Cephalexin 5-day course to end after 1/15 pm dose.  Dennie Fettersgan, Owais Pruett Donovan, [place] Pager: [phone] 11/10/2017,12:50 PM

## 2017-11-10 NOTE — Progress Notes (Signed)
Inpatient Diabetes Program Recommendations  AACE/ADA: New Consensus Statement on Inpatient Glycemic Control (2015)  Target Ranges:  Prepandial:   less than 140 mg/dL      Peak postprandial:   less than 180 mg/dL (1-2 hours)      Critically ill patients:  140 - 180 mg/dL   Lab Results  Component Value Date   GLUCAP 108 (H) 11/10/2017   HGBA1C 6.6 (H) 11/04/2017   Review of Glycemic Control  Diabetes history: New Diagnosis  Inpatient Diabetes Program Recommendations:    Patient does not think she has Diabetes. Discussed A1c parameters of Pre Diabetes versus Diabetes. Discussed A1c results (6.6% on 1/8). Discussed with patient the importance of glucose control. Patient does not have family members who have diabetes. Spoke with patient about potential lifestyle modifications and following up with her PCP regularly on her A1c level.  Thanks,  Christena DeemShannon Aleza Pew RN, MSN, [facility] Inpatient Diabetes Coordinator Team Pager [phone] (8a-5p)

## 2017-11-10 NOTE — Progress Notes (Signed)
CARDIAC REHAB PHASE I   PRE:  Rate/Rhythm: 61 SR PACs few PVCs  BP:  Supine: 143/67  Sitting:   Standing:    SaO2: 100%RA  MODE:  Ambulation: 470 ft   POST:  Rate/Rhythm: 91 SR PACs  BP:  Supine:   Sitting: 149/106, 143/92  Standing:    SaO2: 100%RA 0955-1020 Pt getting nervous and stated not sleeping well at night. Emotional support given. Pt walked 470 ft on RA with steady gait and no CP. Tolerated well.   Luetta Nuttingharlene Jeffrey Voth, RN BSN  11/10/2017 10:15 AM

## 2017-11-10 NOTE — Plan of Care (Signed)
Pt maintains oxygen saturation levels above 95% on room air. Respiratory rate and rhythm regular. No complaints of shortness of breath or dyspnea on exertion

## 2017-11-11 ENCOUNTER — Inpatient Hospital Stay (HOSPITAL_COMMUNITY): Payer: BLUE CROSS/BLUE SHIELD

## 2017-11-11 LAB — COMPREHENSIVE METABOLIC PANEL
ALT: 27 U/L (ref 14–54)
AST: 22 U/L (ref 15–41)
Albumin: 3.6 g/dL (ref 3.5–5.0)
Alkaline Phosphatase: 73 U/L (ref 38–126)
Anion gap: 9 (ref 5–15)
BUN: 11 mg/dL (ref 6–20)
CO2: 25 mmol/L (ref 22–32)
Calcium: 9.4 mg/dL (ref 8.9–10.3)
Chloride: 104 mmol/L (ref 101–111)
Creatinine, Ser: 1.14 mg/dL — ABNORMAL HIGH (ref 0.44–1.00)
GFR calc Af Amer: >60 mL/min (ref >60)
GFR calc non Af Amer: 52 mL/min — ABNORMAL LOW (ref >60)
Glucose, Bld: 110 mg/dL — ABNORMAL HIGH (ref 65–99)
Potassium: 4.4 mmol/L (ref 3.5–5.1)
Sodium: 138 mmol/L (ref 135–145)
Total Bilirubin: 0.9 mg/dL (ref 0.3–1.2)
Total Protein: 7 g/dL (ref 6.5–8.1)

## 2017-11-11 LAB — GLUCOSE, CAPILLARY
GLUCOSE-CAPILLARY: 101 mg/dL — AB (ref 65–99)
GLUCOSE-CAPILLARY: 117 mg/dL — AB (ref 65–99)
GLUCOSE-CAPILLARY: 127 mg/dL — AB (ref 65–99)
Glucose-Capillary: 92 mg/dL (ref 65–99)

## 2017-11-11 LAB — BLOOD GAS, ARTERIAL
Acid-base deficit: 2.5 mmol/L — ABNORMAL HIGH (ref 0.0–2.0)
Bicarbonate: 21.7 mmol/L (ref 20.0–28.0)
Drawn by: 52075
FIO2: 21
O2 Saturation: 97.2 %
Patient temperature: 98.6
pCO2 arterial: 36.9 mmHg (ref 32.0–48.0)
pH, Arterial: 7.387 (ref 7.350–7.450)
pO2, Arterial: 98.8 mmHg (ref 83.0–108.0)

## 2017-11-11 LAB — PREPARE RBC (CROSSMATCH)

## 2017-11-11 LAB — CBC
HEMATOCRIT: 39.1 % (ref 36.0–46.0)
HEMOGLOBIN: 12.7 g/dL (ref 12.0–15.0)
MCH: 31.4 pg (ref 26.0–34.0)
MCHC: 32.5 g/dL (ref 30.0–36.0)
MCV: 96.5 fL (ref 78.0–100.0)
Platelets: 329 10*3/uL (ref 150–400)
RBC: 4.05 MIL/uL (ref 3.87–5.11)
RDW: 13.3 % (ref 11.5–15.5)
WBC: 13.2 10*3/uL — ABNORMAL HIGH (ref 4.0–10.5)

## 2017-11-11 LAB — HEPARIN LEVEL (UNFRACTIONATED): Heparin Unfractionated: 0.52 IU/mL (ref 0.30–0.70)

## 2017-11-11 LAB — HEMOGLOBIN A1C
Hgb A1c MFr Bld: 6.8 % — ABNORMAL HIGH (ref 4.8–5.6)
MEAN PLASMA GLUCOSE: 148.46 mg/dL

## 2017-11-11 LAB — ABO/RH: ABO/RH(D): A POS

## 2017-11-11 MED ORDER — SODIUM CHLORIDE 0.9 % IV SOLN
30.0000 ug/min | INTRAVENOUS | Status: AC
  Administered 2017-11-12: 40 ug/min via INTRAVENOUS
  Filled 2017-11-11: qty 2

## 2017-11-11 MED ORDER — MILRINONE LACTATE IN DEXTROSE 20-5 MG/100ML-% IV SOLN
0.1250 ug/kg/min | INTRAVENOUS | Status: DC
  Filled 2017-11-11: qty 100

## 2017-11-11 MED ORDER — NITROGLYCERIN IN D5W 200-5 MCG/ML-% IV SOLN
2.0000 ug/min | INTRAVENOUS | Status: DC
  Filled 2017-11-11: qty 250

## 2017-11-11 MED ORDER — DOPAMINE-DEXTROSE 3.2-5 MG/ML-% IV SOLN
0.0000 ug/kg/min | INTRAVENOUS | Status: DC
  Filled 2017-11-11: qty 250

## 2017-11-11 MED ORDER — TRANEXAMIC ACID (OHS) BOLUS VIA INFUSION
15.0000 mg/kg | INTRAVENOUS | Status: AC
  Administered 2017-11-12: 1291.5 mg via INTRAVENOUS
  Filled 2017-11-11: qty 1292

## 2017-11-11 MED ORDER — SODIUM CHLORIDE 0.9 % IV SOLN
1.5000 mg/kg/h | INTRAVENOUS | Status: AC
  Administered 2017-11-12: 1.5 mg/kg/h via INTRAVENOUS
  Filled 2017-11-11: qty 25

## 2017-11-11 MED ORDER — TEMAZEPAM 15 MG PO CAPS
15.0000 mg | ORAL_CAPSULE | Freq: Once | ORAL | Status: AC | PRN
  Administered 2017-11-11: 15 mg via ORAL
  Filled 2017-11-11: qty 1

## 2017-11-11 MED ORDER — INSULIN REGULAR HUMAN 100 UNIT/ML IJ SOLN
INTRAMUSCULAR | Status: AC
  Administered 2017-11-12: 1.2 [IU]/h via INTRAVENOUS
  Filled 2017-11-11: qty 1

## 2017-11-11 MED ORDER — CEFUROXIME SODIUM 1.5 G IV SOLR
1.5000 g | INTRAVENOUS | Status: AC
  Administered 2017-11-12: 1.5 g via INTRAVENOUS
  Filled 2017-11-11: qty 1.5

## 2017-11-11 MED ORDER — EPINEPHRINE PF 1 MG/ML IJ SOLN
0.0000 ug/min | INTRAVENOUS | Status: DC
  Filled 2017-11-11: qty 4

## 2017-11-11 MED ORDER — POTASSIUM CHLORIDE 2 MEQ/ML IV SOLN
80.0000 meq | INTRAVENOUS | Status: DC
  Filled 2017-11-11: qty 40

## 2017-11-11 MED ORDER — BISACODYL 5 MG PO TBEC
5.0000 mg | DELAYED_RELEASE_TABLET | Freq: Once | ORAL | Status: DC
  Filled 2017-11-11: qty 1

## 2017-11-11 MED ORDER — DEXMEDETOMIDINE HCL IN NACL 400 MCG/100ML IV SOLN
0.1000 ug/kg/h | INTRAVENOUS | Status: AC
  Administered 2017-11-12: .3 ug/kg/h via INTRAVENOUS
  Filled 2017-11-11: qty 100

## 2017-11-11 MED ORDER — MAGNESIUM SULFATE 50 % IJ SOLN
40.0000 meq | INTRAMUSCULAR | Status: DC
  Filled 2017-11-11: qty 9.85

## 2017-11-11 MED ORDER — PLASMA-LYTE 148 IV SOLN
INTRAVENOUS | Status: AC
  Administered 2017-11-12: 500 mL
  Filled 2017-11-11: qty 2.5

## 2017-11-11 MED ORDER — DEXTROSE 5 % IV SOLN
750.0000 mg | INTRAVENOUS | Status: DC
  Filled 2017-11-11: qty 750

## 2017-11-11 MED ORDER — METOPROLOL TARTRATE 12.5 MG HALF TABLET
12.5000 mg | ORAL_TABLET | Freq: Once | ORAL | Status: AC
  Administered 2017-11-12: 12.5 mg via ORAL
  Filled 2017-11-11: qty 1

## 2017-11-11 MED ORDER — CHLORHEXIDINE GLUCONATE 4 % EX LIQD
60.0000 mL | Freq: Once | CUTANEOUS | Status: AC
  Administered 2017-11-12: 4 via TOPICAL
  Filled 2017-11-11: qty 60

## 2017-11-11 MED ORDER — SODIUM CHLORIDE 0.9 % IV SOLN
INTRAVENOUS | Status: DC
  Filled 2017-11-11: qty 30

## 2017-11-11 MED ORDER — CHLORHEXIDINE GLUCONATE 4 % EX LIQD
60.0000 mL | Freq: Once | CUTANEOUS | Status: AC
  Administered 2017-11-11: 4 via TOPICAL
  Filled 2017-11-11: qty 60

## 2017-11-11 MED ORDER — VANCOMYCIN HCL 10 G IV SOLR
1500.0000 mg | INTRAVENOUS | Status: AC
  Administered 2017-11-12: 1500 mg via INTRAVENOUS
  Filled 2017-11-11: qty 1500

## 2017-11-11 MED ORDER — DIAZEPAM 5 MG PO TABS
5.0000 mg | ORAL_TABLET | Freq: Once | ORAL | Status: AC
  Administered 2017-11-12: 5 mg via ORAL
  Filled 2017-11-11: qty 1

## 2017-11-11 MED ORDER — CHLORHEXIDINE GLUCONATE 0.12 % MT SOLN
15.0000 mL | Freq: Once | OROMUCOSAL | Status: AC
  Administered 2017-11-12: 15 mL via OROMUCOSAL
  Filled 2017-11-11: qty 15

## 2017-11-11 MED ORDER — TRANEXAMIC ACID (OHS) PUMP PRIME SOLUTION
2.0000 mg/kg | INTRAVENOUS | Status: DC
  Filled 2017-11-11: qty 1.72

## 2017-11-11 NOTE — Progress Notes (Signed)
ANTICOAGULATION CONSULT NOTE - Follow Up Consult  Pharmacy Consult for Heparin Indication: chest pain/ACS/CAD awaiting CABG  No Known Allergies  Patient Measurements: Height: 5\' 6"  (167.6 cm) Weight: 189 lb 14.4 oz (86.1 kg) IBW/kg (Calculated) : 59.3 Heparin Dosing Weight: 77 kg  Vital Signs: Temp: 98.5 F (36.9 C) (01/15 0527) Temp Source: Oral (01/15 0527) BP: 145/61 (01/15 0527) Pulse Rate: 49 (01/15 0527)  Labs: Recent Labs    11/09/17 0823 11/10/17 0530 11/11/17 0438  HGB 12.9 13.3 12.7  HCT 40.2 40.7 39.1  PLT 358 355 329  HEPARINUNFRC 0.44 0.50 0.52  CREATININE 1.12* 1.00 1.14*    Estimated Creatinine Clearance: 59.4 mL/min (A) (by C-G formula based on SCr of 1.14 mg/dL (H)).  Assessment: 57 yr old female admitted to [facility] 1/8 with NSTEMI; cath showed 3V CAD. [facility];  CABG planned on 1/16.  Heparin level remains therapeutic (0.52) on 1250 units/hr. CBC is stable. No infusion issues documented. No signs/symptoms of bleeding.   Goal of Therapy:  Heparin level 0.3-0.7 units/ml Monitor platelets by anticoagulation protocol: Yes   Plan:  Continue heparin drip at 1250 units/hr. Daily heparin level and CBC while on heparin. Follow up after CABG  Girard CooterKimberly Perkins, PharmD Clinical Pharmacist  Pager: [phone] Clinical Phone for 11/11/2017 until 3:30pm: [phone] If after 3:30pm, please call main pharmacy at [phone] 11/11/2017,7:49 AM

## 2017-11-11 NOTE — Progress Notes (Signed)
Progress Note  Patient Name: Caitlyn Herrera Date of Encounter: 11/11/2017  Primary Cardiologist: Yvonne Kendallhristopher End, MD   Subjective   NO CP  Had some last night  NTG restarted  Breathing OK  No headache   Inpatient Medications    Scheduled Meds: . aspirin EC  81 mg Oral Daily  . atorvastatin  80 mg Oral q1800  . cephALEXin  500 mg Oral Q12H  . guaiFENesin  600 mg Oral BID  . insulin aspart  0-5 Units Subcutaneous QHS  . insulin aspart  0-9 Units Subcutaneous TID WC  . metoprolol tartrate  25 mg Oral BID  . mometasone-formoterol  2 puff Inhalation BID  . nicotine  14 mg Transdermal Daily   Continuous Infusions: . heparin 1,250 Units/hr (11/11/17 0610)  . nitroGLYCERIN 5 mcg/min (11/11/17 0610)   PRN Meds: acetaminophen, guaiFENesin-dextromethorphan, levalbuterol, magnesium hydroxide, nitroGLYCERIN, ondansetron (ZOFRAN) IV, polyethylene glycol, traMADol   Vital Signs    Vitals:   11/10/17 1958 11/10/17 2112 11/10/17 2114 11/11/17 0527  BP:  129/75 129/75 (!) 145/61  Pulse:  65 62 (!) 49  Resp:      Temp:   (!) 97.5 F (36.4 C) 98.5 F (36.9 C)  TempSrc:   Oral Oral  SpO2: 99%  99% 100%  Weight:    189 lb 14.4 oz (86.1 kg)  Height:        Intake/Output Summary (Last 24 hours) at 11/11/2017 0822 Last data filed at 11/11/2017 0610 Gross per 24 hour  Intake 1624.84 ml  Output 4 ml  Net 1620.84 ml   Filed Weights   11/09/17 0607 11/10/17 0636 11/11/17 0527  Weight: 191 lb 3.2 oz (86.7 kg) 188 lb 12.8 oz (85.6 kg) 189 lb 14.4 oz (86.1 kg)    Telemetry   SR- Personally Reviewed  ECG     Physical Exam   GEN: No acute distress.   Neck: JVP normal   Cardiac: RRR, no murmurs, rubs, or gallops.  Respiratory: Clear to auscultation bilaterally. GI: Soft, nontender, non-distended  MS: No edema; No deformity. Neuro:  Nonfocal  Psych: Normal affect   Labs    Chemistry Recent Labs  Lab 11/06/17 0831  11/09/17 0823 11/10/17 0530 11/11/17 0438  NA  137   < > 138 138 138  K 4.0   < > 4.0 4.0 4.4  CL 106   < > 105 105 104  CO2 23   < > 24 24 25   GLUCOSE 110*   < > 146* 129* 110*  BUN 10   < > 9 9 11   CREATININE 1.01*   < > 1.12* 1.00 1.14*  CALCIUM 9.2   < > 9.4 9.6 9.4  PROT 6.6  --   --   --  7.0  ALBUMIN 3.5  --   --   --  3.6  AST 25  --   --   --  22  ALT 17  --   --   --  27  ALKPHOS 76  --   --   --  73  BILITOT 0.6  --   --   --  0.9  GFRNONAA >60   < > 53* >60 52*  GFRAA >60   < > >60 >60 >60  ANIONGAP 8   < > 9 9 9    < > = values in this interval not displayed.     Hematology Recent Labs  Lab 11/09/17 0823 11/10/17 0530 11/11/17 0438  WBC  13.1* 15.3* 13.2*  RBC 4.14 4.19 4.05  HGB 12.9 13.3 12.7  HCT 40.2 40.7 39.1  MCV 97.1 97.1 96.5  MCH 31.2 31.7 31.4  MCHC 32.1 32.7 32.5  RDW 13.5 13.4 13.3  PLT 358 355 329    Cardiac Enzymes Recent Labs  Lab 11/04/17 0952 11/04/17 1802 11/06/17 0831 11/06/17 1401  TROPONINI 1.67* 1.91* 0.98* 0.94*   No results for input(s): TROPIPOC in the last 168 hours.   BNPNo results for input(s): BNP, PROBNP in the last 168 hours.   DDimer No results for input(s): DDIMER in the last 168 hours.   Radiology   Chest CT 11/07/17- IMPRESSION: 1.  No acute intrathoracic process. 2. Subtle mosaic attenuation in the bilateral lower lobes and right middle lobe may reflect small airways disease. 3. 5 mm pulmonary nodule in the right lung apex. No follow-up needed if patient is low-risk. Non-contrast chest CT can be considered in 12 months if patient is high-risk. This recommendation follows the consensus statement: Guidelines for Management of Incidental Pulmonary Nodules Detected on CT Images: From the Fleischner Society 2017; Radiology 2017; 284:228-243. 4.  Emphysema (ICD10-J43.9). 5.  Aortic atherosclerosis (ICD10-I70.0).   Cardiac Studies   Echo 11/04/17- Study Conclusions  - Left ventricle: The cavity size was normal. Wall thickness was   increased in a  pattern of moderate LVH. Systolic function was   vigorous. The estimated ejection fraction was in the range of 65%   to 70%. Wall motion was normal; there were no regional wall   motion abnormalities. Left ventricular diastolic function   parameters were normal. - Right ventricle: The cavity size was normal. Systolic function   was normal. - Tricuspid valve: There was mild regurgitation.  Cath 1/8 LAD 90% mid LCx 80% prox/mid OM1 99% RCA 80% prox  60% mid; 90% distal    Patient Profile     57 y.o.AA female from [place] with a history of HTN, HLD, and new onset NIDDM admitted 11/04/17 to [facility] with a NSTEMI. Cath showed 3V CAD and normal LVF. Pt was transferred to [facility] 11/05/17 for further evaluation. Plan is for CABG.   Assessment & Plan       CAD-  Pt presented with NSTEMI  Cath with 3V CAD noted above   Pt is for CABG 11/12/17  NIDDM-  New diagnosis Will need to be followed after surgery   HLD- High dose statin Rx    HTN Normal LVF, moderate LVH on echo  BP OK now    PVD- Mild bilateral ICA disease and moderate LEA disease by pre CABG doppler.   Pulmonary nodule on CT She will need f/u with her PCP for this  UTI- On Keflex  Tob:  Pt with nicotine patch      For questions or updates, please contact [facility] Please consult [email or web address] for contact info under Cardiology/STEMI.      Signed, Dietrich Pates, MD  11/11/2017, 8:22 AM     Dietrich Pates

## 2017-11-12 ENCOUNTER — Inpatient Hospital Stay (HOSPITAL_COMMUNITY): Payer: BLUE CROSS/BLUE SHIELD | Admitting: Certified Registered Nurse Anesthetist

## 2017-11-12 ENCOUNTER — Inpatient Hospital Stay (HOSPITAL_COMMUNITY): Payer: BLUE CROSS/BLUE SHIELD

## 2017-11-12 ENCOUNTER — Other Ambulatory Visit (HOSPITAL_COMMUNITY): Payer: BLUE CROSS/BLUE SHIELD

## 2017-11-12 ENCOUNTER — Encounter (HOSPITAL_COMMUNITY)
Admission: AD | Disposition: A | Discharge status: Home Health | Payer: Self-pay | Source: Other Acute Inpatient Hospital | Attending: Cardiovascular Disease

## 2017-11-12 DIAGNOSIS — Z951 Presence of aortocoronary bypass graft: Secondary | ICD-10-CM

## 2017-11-12 DIAGNOSIS — I2510 Atherosclerotic heart disease of native coronary artery without angina pectoris: Secondary | ICD-10-CM

## 2017-11-12 HISTORY — PX: TEE WITHOUT CARDIOVERSION: SHX5443

## 2017-11-12 HISTORY — PX: CORONARY ARTERY BYPASS GRAFT: SHX141

## 2017-11-12 HISTORY — DX: Presence of aortocoronary bypass graft: Z95.1

## 2017-11-12 LAB — POCT I-STAT 3, ART BLOOD GAS (G3+)
ACID-BASE DEFICIT: 3 mmol/L — AB (ref 0.0–2.0)
ACID-BASE EXCESS: 2 mmol/L (ref 0.0–2.0)
Acid-base deficit: 6 mmol/L — ABNORMAL HIGH (ref 0.0–2.0)
Acid-base deficit: 3 mmol/L — ABNORMAL HIGH (ref 0.0–2.0)
Acid-base deficit: 2 mmol/L (ref 0.0–2.0)
BICARBONATE: 22.5 mmol/L (ref 20.0–28.0)
Bicarbonate: 26.9 mmol/L (ref 20.0–28.0)
Bicarbonate: 22.5 mmol/L (ref 20.0–28.0)
Bicarbonate: 23.8 mmol/L (ref 20.0–28.0)
Bicarbonate: 22.4 mmol/L (ref 20.0–28.0)
O2 SAT: 100 %
O2 Saturation: 100 %
O2 Saturation: 99 %
O2 Saturation: 99 %
O2 Saturation: 100 %
PH ART: 7.313 — AB (ref 7.350–7.450)
PO2 ART: 240 mmHg — AB (ref 83.0–108.0)
Patient temperature: 35.9
Patient temperature: 36.2
Patient temperature: 36.3
TCO2: 28 mmol/L (ref 22–32)
TCO2: 24 mmol/L (ref 22–32)
TCO2: 24 mmol/L (ref 22–32)
TCO2: 25 mmol/L (ref 22–32)
TCO2: 23 mmol/L (ref 22–32)
pCO2 arterial: 45.4 mmHg (ref 32.0–48.0)
pCO2 arterial: 44.3 mmHg (ref 32.0–48.0)
pCO2 arterial: 53.1 mmHg — ABNORMAL HIGH (ref 32.0–48.0)
pCO2 arterial: 49.3 mmHg — ABNORMAL HIGH (ref 32.0–48.0)
pCO2 arterial: 32.8 mmHg (ref 32.0–48.0)
pH, Arterial: 7.381 (ref 7.350–7.450)
pH, Arterial: 7.229 — ABNORMAL LOW (ref 7.350–7.450)
pH, Arterial: 7.287 — ABNORMAL LOW (ref 7.350–7.450)
pH, Arterial: 7.44 (ref 7.350–7.450)
pO2, Arterial: 411 mmHg — ABNORMAL HIGH (ref 83.0–108.0)
pO2, Arterial: 152 mmHg — ABNORMAL HIGH (ref 83.0–108.0)
pO2, Arterial: 170 mmHg — ABNORMAL HIGH (ref 83.0–108.0)
pO2, Arterial: 192 mmHg — ABNORMAL HIGH (ref 83.0–108.0)

## 2017-11-12 LAB — APTT
APTT: 27 s (ref 24–36)
aPTT: 92 s — ABNORMAL HIGH (ref 24–36)

## 2017-11-12 LAB — CBC
HCT: 30.4 % — ABNORMAL LOW (ref 36.0–46.0)
HCT: 30.4 % — ABNORMAL LOW (ref 36.0–46.0)
HEMATOCRIT: 40 % (ref 36.0–46.0)
HEMATOCRIT: 31.6 % — AB (ref 36.0–46.0)
HEMOGLOBIN: 12.8 g/dL (ref 12.0–15.0)
HEMOGLOBIN: 10.6 g/dL — AB (ref 12.0–15.0)
Hemoglobin: 9.8 g/dL — ABNORMAL LOW (ref 12.0–15.0)
Hemoglobin: 10.3 g/dL — ABNORMAL LOW (ref 12.0–15.0)
MCH: 30.8 pg (ref 26.0–34.0)
MCH: 32.1 pg (ref 26.0–34.0)
MCH: 30.3 pg (ref 26.0–34.0)
MCH: 32.1 pg (ref 26.0–34.0)
MCHC: 32 g/dL (ref 30.0–36.0)
MCHC: 33.5 g/dL (ref 30.0–36.0)
MCHC: 32.2 g/dL (ref 30.0–36.0)
MCHC: 33.9 g/dL (ref 30.0–36.0)
MCV: 96.4 fL (ref 78.0–100.0)
MCV: 95.8 fL (ref 78.0–100.0)
MCV: 94.1 fL (ref 78.0–100.0)
MCV: 94.7 fL (ref 78.0–100.0)
Platelets: 328 10*3/uL (ref 150–400)
Platelets: 159 10*3/uL (ref 150–400)
Platelets: 175 10*3/uL (ref 150–400)
Platelets: 178 10*3/uL (ref 150–400)
RBC: 4.15 MIL/uL (ref 3.87–5.11)
RBC: 3.3 MIL/uL — AB (ref 3.87–5.11)
RBC: 3.21 MIL/uL — ABNORMAL LOW (ref 3.87–5.11)
RBC: 3.23 MIL/uL — ABNORMAL LOW (ref 3.87–5.11)
RDW: 13.4 % (ref 11.5–15.5)
RDW: 13.3 % (ref 11.5–15.5)
RDW: 13.1 % (ref 11.5–15.5)
RDW: 13.4 % (ref 11.5–15.5)
WBC: 13.4 10*3/uL — ABNORMAL HIGH (ref 4.0–10.5)
WBC: 14.6 10*3/uL — AB (ref 4.0–10.5)
WBC: 11.8 10*3/uL — ABNORMAL HIGH (ref 4.0–10.5)
WBC: 12.3 10*3/uL — ABNORMAL HIGH (ref 4.0–10.5)

## 2017-11-12 LAB — POCT I-STAT 4, (NA,K, GLUC, HGB,HCT)
Glucose, Bld: 139 mg/dL — ABNORMAL HIGH (ref 65–99)
HCT: 31 % — ABNORMAL LOW (ref 36.0–46.0)
Hemoglobin: 10.5 g/dL — ABNORMAL LOW (ref 12.0–15.0)
Potassium: 4.2 mmol/L (ref 3.5–5.1)
Sodium: 143 mmol/L (ref 135–145)

## 2017-11-12 LAB — GLUCOSE, CAPILLARY
Glucose-Capillary: 121 mg/dL — ABNORMAL HIGH (ref 65–99)
Glucose-Capillary: 122 mg/dL — ABNORMAL HIGH (ref 65–99)
Glucose-Capillary: 125 mg/dL — ABNORMAL HIGH (ref 65–99)
Glucose-Capillary: 126 mg/dL — ABNORMAL HIGH (ref 65–99)
Glucose-Capillary: 136 mg/dL — ABNORMAL HIGH (ref 65–99)
Glucose-Capillary: 139 mg/dL — ABNORMAL HIGH (ref 65–99)
Glucose-Capillary: 140 mg/dL — ABNORMAL HIGH (ref 65–99)
Glucose-Capillary: 141 mg/dL — ABNORMAL HIGH (ref 65–99)
Glucose-Capillary: 151 mg/dL — ABNORMAL HIGH (ref 65–99)
Glucose-Capillary: 77 mg/dL (ref 65–99)

## 2017-11-12 LAB — POCT I-STAT, CHEM 8
BUN: 15 mg/dL (ref 6–20)
BUN: 11 mg/dL (ref 6–20)
BUN: 13 mg/dL (ref 6–20)
BUN: 13 mg/dL (ref 6–20)
BUN: 13 mg/dL (ref 6–20)
BUN: 12 mg/dL (ref 6–20)
BUN: 10 mg/dL (ref 6–20)
CALCIUM ION: 1.29 mmol/L (ref 1.15–1.40)
CALCIUM ION: 1.04 mmol/L — AB (ref 1.15–1.40)
CALCIUM ION: 1.28 mmol/L (ref 1.15–1.40)
CALCIUM ION: 1.11 mmol/L — AB (ref 1.15–1.40)
CHLORIDE: 104 mmol/L (ref 101–111)
CHLORIDE: 107 mmol/L (ref 101–111)
CREATININE: 0.9 mg/dL (ref 0.44–1.00)
CREATININE: 0.8 mg/dL (ref 0.44–1.00)
CREATININE: 0.8 mg/dL (ref 0.44–1.00)
Calcium, Ion: 1.13 mmol/L — ABNORMAL LOW (ref 1.15–1.40)
Calcium, Ion: 1.14 mmol/L — ABNORMAL LOW (ref 1.15–1.40)
Calcium, Ion: 1.25 mmol/L (ref 1.15–1.40)
Chloride: 101 mmol/L (ref 101–111)
Chloride: 103 mmol/L (ref 101–111)
Chloride: 104 mmol/L (ref 101–111)
Chloride: 103 mmol/L (ref 101–111)
Chloride: 108 mmol/L (ref 101–111)
Creatinine, Ser: 0.8 mg/dL (ref 0.44–1.00)
Creatinine, Ser: 0.9 mg/dL (ref 0.44–1.00)
Creatinine, Ser: 0.8 mg/dL (ref 0.44–1.00)
Creatinine, Ser: 0.8 mg/dL (ref 0.44–1.00)
GLUCOSE: 120 mg/dL — AB (ref 65–99)
GLUCOSE: 109 mg/dL — AB (ref 65–99)
GLUCOSE: 99 mg/dL (ref 65–99)
GLUCOSE: 167 mg/dL — AB (ref 65–99)
Glucose, Bld: 125 mg/dL — ABNORMAL HIGH (ref 65–99)
Glucose, Bld: 164 mg/dL — ABNORMAL HIGH (ref 65–99)
Glucose, Bld: 148 mg/dL — ABNORMAL HIGH (ref 65–99)
HCT: 36 % (ref 36.0–46.0)
HCT: 28 % — ABNORMAL LOW (ref 36.0–46.0)
HCT: 33 % — ABNORMAL LOW (ref 36.0–46.0)
HCT: 26 % — ABNORMAL LOW (ref 36.0–46.0)
HCT: 28 % — ABNORMAL LOW (ref 36.0–46.0)
HEMATOCRIT: 25 % — AB (ref 36.0–46.0)
HEMATOCRIT: 23 % — AB (ref 36.0–46.0)
HEMOGLOBIN: 8.5 g/dL — AB (ref 12.0–15.0)
HEMOGLOBIN: 8.8 g/dL — AB (ref 12.0–15.0)
Hemoglobin: 12.2 g/dL (ref 12.0–15.0)
Hemoglobin: 11.2 g/dL — ABNORMAL LOW (ref 12.0–15.0)
Hemoglobin: 9.5 g/dL — ABNORMAL LOW (ref 12.0–15.0)
Hemoglobin: 7.8 g/dL — ABNORMAL LOW (ref 12.0–15.0)
Hemoglobin: 9.5 g/dL — ABNORMAL LOW (ref 12.0–15.0)
POTASSIUM: 4.1 mmol/L (ref 3.5–5.1)
POTASSIUM: 5.3 mmol/L — AB (ref 3.5–5.1)
POTASSIUM: 4.4 mmol/L (ref 3.5–5.1)
Potassium: 3.8 mmol/L (ref 3.5–5.1)
Potassium: 3.8 mmol/L (ref 3.5–5.1)
Potassium: 4.8 mmol/L (ref 3.5–5.1)
Potassium: 4 mmol/L (ref 3.5–5.1)
SODIUM: 141 mmol/L (ref 135–145)
SODIUM: 139 mmol/L (ref 135–145)
SODIUM: 140 mmol/L (ref 135–145)
Sodium: 140 mmol/L (ref 135–145)
Sodium: 142 mmol/L (ref 135–145)
Sodium: 138 mmol/L (ref 135–145)
Sodium: 143 mmol/L (ref 135–145)
TCO2: 27 mmol/L (ref 22–32)
TCO2: 25 mmol/L (ref 22–32)
TCO2: 25 mmol/L (ref 22–32)
TCO2: 26 mmol/L (ref 22–32)
TCO2: 25 mmol/L (ref 22–32)
TCO2: 23 mmol/L (ref 22–32)
TCO2: 23 mmol/L (ref 22–32)

## 2017-11-12 LAB — BASIC METABOLIC PANEL
ANION GAP: 10 (ref 5–15)
BUN: 14 mg/dL (ref 6–20)
CALCIUM: 9.7 mg/dL (ref 8.9–10.3)
CO2: 24 mmol/L (ref 22–32)
Chloride: 104 mmol/L (ref 101–111)
Creatinine, Ser: 1.18 mg/dL — ABNORMAL HIGH (ref 0.44–1.00)
GFR calc Af Amer: 58 mL/min — ABNORMAL LOW (ref >60)
GFR, EST NON AFRICAN AMERICAN: 50 mL/min — AB (ref >60)
Glucose, Bld: 116 mg/dL — ABNORMAL HIGH (ref 65–99)
POTASSIUM: 3.9 mmol/L (ref 3.5–5.1)
Sodium: 138 mmol/L (ref 135–145)

## 2017-11-12 LAB — CREATININE, SERUM
Creatinine, Ser: 0.91 mg/dL (ref 0.44–1.00)
Creatinine, Ser: 0.94 mg/dL (ref 0.44–1.00)
GFR calc Af Amer: >60 mL/min (ref >60)
GFR calc Af Amer: >60 mL/min (ref >60)
GFR calc non Af Amer: >60 mL/min (ref >60)
GFR calc non Af Amer: >60 mL/min (ref >60)

## 2017-11-12 LAB — HEPARIN LEVEL (UNFRACTIONATED): HEPARIN UNFRACTIONATED: 0.53 [IU]/mL (ref 0.30–0.70)

## 2017-11-12 LAB — PROTIME-INR
INR: 1.39
Prothrombin Time: 16.9 seconds — ABNORMAL HIGH (ref 11.4–15.2)

## 2017-11-12 LAB — MAGNESIUM
Magnesium: 2.4 mg/dL (ref 1.7–2.4)
Magnesium: 2.6 mg/dL — ABNORMAL HIGH (ref 1.7–2.4)

## 2017-11-12 LAB — MRSA PCR SCREENING: MRSA by PCR: NEGATIVE

## 2017-11-12 LAB — PLATELET COUNT: Platelets: 175 10*3/uL (ref 150–400)

## 2017-11-12 LAB — HEMOGLOBIN AND HEMATOCRIT, BLOOD
HCT: 24.2 % — ABNORMAL LOW (ref 36.0–46.0)
Hemoglobin: 8 g/dL — ABNORMAL LOW (ref 12.0–15.0)

## 2017-11-12 SURGERY — CORONARY ARTERY BYPASS GRAFTING (CABG)
Anesthesia: General | Site: Chest

## 2017-11-12 MED ORDER — ARTIFICIAL TEARS OPHTHALMIC OINT
TOPICAL_OINTMENT | OPHTHALMIC | Status: DC | PRN
  Administered 2017-11-12: 1 via OPHTHALMIC

## 2017-11-12 MED ORDER — PROPOFOL 10 MG/ML IV BOLUS
INTRAVENOUS | Status: AC
  Filled 2017-11-12: qty 20

## 2017-11-12 MED ORDER — MORPHINE SULFATE (PF) 4 MG/ML IV SOLN: 1.0000 mg | INTRAVENOUS | Status: DC | PRN

## 2017-11-12 MED ORDER — GELATIN ABSORBABLE MT POWD
OROMUCOSAL | Status: DC | PRN
  Administered 2017-11-12: 4 mL via TOPICAL

## 2017-11-12 MED ORDER — METOCLOPRAMIDE HCL 5 MG/ML IJ SOLN
10.0000 mg | Freq: Four times a day (QID) | INTRAMUSCULAR | Status: DC
  Administered 2017-11-12 – 2017-11-17 (×19): 10 mg via INTRAVENOUS
  Filled 2017-11-12 (×19): qty 2

## 2017-11-12 MED ORDER — DOCUSATE SODIUM 100 MG PO CAPS
200.0000 mg | ORAL_CAPSULE | Freq: Every day | ORAL | Status: DC
  Administered 2017-11-13 – 2017-11-16 (×4): 200 mg via ORAL
  Filled 2017-11-12 (×5): qty 2

## 2017-11-12 MED ORDER — POTASSIUM CHLORIDE 10 MEQ/50ML IV SOLN: 10.0000 meq | INTRAVENOUS | Status: AC

## 2017-11-12 MED ORDER — ORAL CARE MOUTH RINSE
15.0000 mL | OROMUCOSAL | Status: DC
  Administered 2017-11-12 (×2): 15 mL via OROMUCOSAL

## 2017-11-12 MED ORDER — SODIUM BICARBONATE 8.4 % IV SOLN
50.0000 meq | Freq: Once | INTRAVENOUS | Status: AC
  Administered 2017-11-12: 50 meq via INTRAVENOUS

## 2017-11-12 MED ORDER — ALBUMIN HUMAN 5 % IV SOLN
250.0000 mL | INTRAVENOUS | Status: AC | PRN
  Administered 2017-11-12 (×4): 250 mL via INTRAVENOUS
  Filled 2017-11-12 (×2): qty 250

## 2017-11-12 MED ORDER — METOPROLOL TARTRATE 25 MG/10 ML ORAL SUSPENSION: 12.5000 mg | Freq: Two times a day (BID) | ORAL | Status: DC

## 2017-11-12 MED ORDER — ONDANSETRON HCL 4 MG/2ML IJ SOLN
4.0000 mg | Freq: Four times a day (QID) | INTRAMUSCULAR | Status: DC | PRN
  Administered 2017-11-13 – 2017-11-15 (×6): 4 mg via INTRAVENOUS
  Filled 2017-11-12 (×6): qty 2

## 2017-11-12 MED ORDER — 0.9 % SODIUM CHLORIDE (POUR BTL) OPTIME
TOPICAL | Status: DC | PRN
  Administered 2017-11-12: 6000 mL
  Administered 2017-11-12: 5000 mL

## 2017-11-12 MED ORDER — SODIUM CHLORIDE 0.9 % IV SOLN
0.0000 ug/min | INTRAVENOUS | Status: DC
  Administered 2017-11-13: 25 ug/min via INTRAVENOUS
  Filled 2017-11-12 (×2): qty 2

## 2017-11-12 MED ORDER — METOPROLOL TARTRATE 12.5 MG HALF TABLET: 12.5000 mg | ORAL_TABLET | Freq: Two times a day (BID) | ORAL | Status: DC

## 2017-11-12 MED ORDER — HEMOSTATIC AGENTS (NO CHARGE) OPTIME
TOPICAL | Status: DC | PRN
  Administered 2017-11-12 (×4): 1 via TOPICAL

## 2017-11-12 MED ORDER — LEVALBUTEROL HCL 1.25 MG/0.5ML IN NEBU
1.2500 mg | INHALATION_SOLUTION | Freq: Three times a day (TID) | RESPIRATORY_TRACT | Status: DC
  Administered 2017-11-12 – 2017-11-16 (×14): 1.25 mg via RESPIRATORY_TRACT
  Filled 2017-11-12 (×15): qty 0.5

## 2017-11-12 MED ORDER — CHLORHEXIDINE GLUCONATE 0.12% ORAL RINSE (MEDLINE KIT)
15.0000 mL | Freq: Two times a day (BID) | OROMUCOSAL | Status: DC
  Administered 2017-11-12: 15 mL via OROMUCOSAL

## 2017-11-12 MED ORDER — ACETAMINOPHEN 650 MG RE SUPP
650.0000 mg | Freq: Once | RECTAL | Status: AC
  Administered 2017-11-12: 650 mg via RECTAL

## 2017-11-12 MED ORDER — SODIUM CHLORIDE 0.9% FLUSH: 3.0000 mL | INTRAVENOUS | Status: DC | PRN

## 2017-11-12 MED ORDER — PROTAMINE SULFATE 10 MG/ML IV SOLN
INTRAVENOUS | Status: DC | PRN
  Administered 2017-11-12: 310 mg via INTRAVENOUS

## 2017-11-12 MED ORDER — OXYCODONE HCL 5 MG PO TABS
5.0000 mg | ORAL_TABLET | ORAL | Status: DC | PRN
  Administered 2017-11-13: 5 mg via ORAL
  Administered 2017-11-13 – 2017-11-17 (×6): 10 mg via ORAL
  Filled 2017-11-12 (×4): qty 2
  Filled 2017-11-12: qty 1
  Filled 2017-11-12 (×2): qty 2

## 2017-11-12 MED ORDER — SODIUM CHLORIDE 0.45 % IV SOLN: INTRAVENOUS | Status: DC | PRN

## 2017-11-12 MED ORDER — BISACODYL 5 MG PO TBEC
10.0000 mg | DELAYED_RELEASE_TABLET | Freq: Every day | ORAL | Status: DC
  Administered 2017-11-13 – 2017-11-16 (×4): 10 mg via ORAL
  Filled 2017-11-12 (×5): qty 2

## 2017-11-12 MED ORDER — MAGNESIUM SULFATE 4 GM/100ML IV SOLN
4.0000 g | Freq: Once | INTRAVENOUS | Status: AC
  Administered 2017-11-12: 4 g via INTRAVENOUS
  Filled 2017-11-12: qty 100

## 2017-11-12 MED ORDER — PHENYLEPHRINE 40 MCG/ML (10ML) SYRINGE FOR IV PUSH (FOR BLOOD PRESSURE SUPPORT)
PREFILLED_SYRINGE | INTRAVENOUS | Status: AC
  Filled 2017-11-12: qty 10

## 2017-11-12 MED ORDER — PROTAMINE SULFATE 10 MG/ML IV SOLN
INTRAVENOUS | Status: AC
  Filled 2017-11-12: qty 25

## 2017-11-12 MED ORDER — NITROGLYCERIN IN D5W 200-5 MCG/ML-% IV SOLN: 0.0000 ug/min | INTRAVENOUS | Status: DC

## 2017-11-12 MED ORDER — SODIUM CHLORIDE 0.9 % IV SOLN
INTRAVENOUS | Status: DC
  Administered 2017-11-12: 20 mL via INTRAVENOUS

## 2017-11-12 MED ORDER — MIDAZOLAM HCL 2 MG/2ML IJ SOLN: 2.0000 mg | INTRAMUSCULAR | Status: DC | PRN

## 2017-11-12 MED ORDER — MORPHINE SULFATE (PF) 4 MG/ML IV SOLN
2.0000 mg | INTRAVENOUS | Status: DC | PRN
  Administered 2017-11-12 – 2017-11-13 (×2): 2 mg via INTRAVENOUS
  Administered 2017-11-13 – 2017-11-14 (×3): 4 mg via INTRAVENOUS
  Filled 2017-11-12 (×4): qty 1

## 2017-11-12 MED ORDER — SODIUM CHLORIDE 0.9% FLUSH
3.0000 mL | Freq: Two times a day (BID) | INTRAVENOUS | Status: DC
  Administered 2017-11-13 – 2017-11-14 (×4): 3 mL via INTRAVENOUS

## 2017-11-12 MED ORDER — LACTATED RINGERS IV SOLN: INTRAVENOUS | Status: DC

## 2017-11-12 MED ORDER — PHENYLEPHRINE HCL 10 MG/ML IJ SOLN
INTRAVENOUS | Status: DC | PRN
  Administered 2017-11-12: 10 ug/min via INTRAVENOUS

## 2017-11-12 MED ORDER — ROCURONIUM BROMIDE 10 MG/ML (PF) SYRINGE
PREFILLED_SYRINGE | INTRAVENOUS | Status: DC | PRN
  Administered 2017-11-12: 40 mg via INTRAVENOUS
  Administered 2017-11-12: 50 mg via INTRAVENOUS
  Administered 2017-11-12: 60 mg via INTRAVENOUS

## 2017-11-12 MED ORDER — ACETAMINOPHEN 500 MG PO TABS
1000.0000 mg | ORAL_TABLET | Freq: Four times a day (QID) | ORAL | Status: DC
  Administered 2017-11-13 – 2017-11-17 (×18): 1000 mg via ORAL
  Filled 2017-11-12 (×19): qty 2

## 2017-11-12 MED ORDER — SODIUM CHLORIDE 0.9 % IV SOLN
250.0000 mL | INTRAVENOUS | Status: DC
  Administered 2017-11-12: 14:00:00 via INTRAVENOUS

## 2017-11-12 MED ORDER — MOMETASONE FURO-FORMOTEROL FUM 200-5 MCG/ACT IN AERO
2.0000 | INHALATION_SPRAY | Freq: Two times a day (BID) | RESPIRATORY_TRACT | Status: DC
  Administered 2017-11-13 – 2017-11-17 (×9): 2 via RESPIRATORY_TRACT
  Filled 2017-11-12: qty 8.8

## 2017-11-12 MED ORDER — ACETAMINOPHEN 160 MG/5ML PO SOLN: 1000.0000 mg | Freq: Four times a day (QID) | ORAL | Status: DC

## 2017-11-12 MED ORDER — VANCOMYCIN HCL IN DEXTROSE 1-5 GM/200ML-% IV SOLN
1000.0000 mg | Freq: Once | INTRAVENOUS | Status: AC
  Administered 2017-11-12: 1000 mg via INTRAVENOUS
  Filled 2017-11-12: qty 200

## 2017-11-12 MED ORDER — ARTIFICIAL TEARS OPHTHALMIC OINT
TOPICAL_OINTMENT | OPHTHALMIC | Status: AC
  Filled 2017-11-12: qty 3.5

## 2017-11-12 MED ORDER — SODIUM BICARBONATE 8.4 % IV SOLN
INTRAVENOUS | Status: AC
  Filled 2017-11-12: qty 100

## 2017-11-12 MED ORDER — METOPROLOL TARTRATE 5 MG/5ML IV SOLN: 2.5000 mg | INTRAVENOUS | Status: DC | PRN

## 2017-11-12 MED ORDER — AMIODARONE HCL IN DEXTROSE 360-4.14 MG/200ML-% IV SOLN: 30.0000 mg/h | INTRAVENOUS | Status: DC

## 2017-11-12 MED ORDER — PANTOPRAZOLE SODIUM 40 MG PO TBEC
40.0000 mg | DELAYED_RELEASE_TABLET | Freq: Every day | ORAL | Status: DC
  Administered 2017-11-14 – 2017-11-17 (×4): 40 mg via ORAL
  Filled 2017-11-12 (×4): qty 1

## 2017-11-12 MED ORDER — DOPAMINE-DEXTROSE 3.2-5 MG/ML-% IV SOLN
2.5000 ug/kg/min | INTRAVENOUS | Status: DC
  Administered 2017-11-12 – 2017-11-14 (×2): 2.5 ug/kg/min via INTRAVENOUS
  Filled 2017-11-12: qty 250

## 2017-11-12 MED ORDER — ACETAMINOPHEN 160 MG/5ML PO SOLN: 650.0000 mg | Freq: Once | ORAL | Status: AC

## 2017-11-12 MED ORDER — FAMOTIDINE IN NACL 20-0.9 MG/50ML-% IV SOLN
20.0000 mg | Freq: Two times a day (BID) | INTRAVENOUS | Status: AC
  Administered 2017-11-12: 20 mg via INTRAVENOUS

## 2017-11-12 MED ORDER — MIDAZOLAM HCL 10 MG/2ML IJ SOLN
INTRAMUSCULAR | Status: AC
  Filled 2017-11-12: qty 2

## 2017-11-12 MED ORDER — LACTATED RINGERS IV SOLN
500.0000 mL | Freq: Once | INTRAVENOUS | Status: DC | PRN
Start: 2017-11-12 — End: 2017-11-13

## 2017-11-12 MED ORDER — PROPOFOL 10 MG/ML IV BOLUS
INTRAVENOUS | Status: DC | PRN
  Administered 2017-11-12: 100 mg via INTRAVENOUS

## 2017-11-12 MED ORDER — SODIUM CHLORIDE 0.9 % IV SOLN
0.0000 ug/kg/h | INTRAVENOUS | Status: DC
  Administered 2017-11-12: 0.7 ug/kg/h via INTRAVENOUS
  Administered 2017-11-12: 0.6 ug/kg/h via INTRAVENOUS
  Filled 2017-11-12 (×4): qty 2

## 2017-11-12 MED ORDER — ALBUMIN HUMAN 5 % IV SOLN
INTRAVENOUS | Status: DC | PRN
  Administered 2017-11-12: 13:00:00 via INTRAVENOUS

## 2017-11-12 MED ORDER — FENTANYL CITRATE (PF) 250 MCG/5ML IJ SOLN
INTRAMUSCULAR | Status: AC
Start: 2017-11-12 — End: 2017-11-12
  Filled 2017-11-12: qty 30

## 2017-11-12 MED ORDER — TRAMADOL HCL 50 MG PO TABS
50.0000 mg | ORAL_TABLET | ORAL | Status: DC | PRN
  Administered 2017-11-13 – 2017-11-17 (×11): 100 mg via ORAL
  Filled 2017-11-12 (×11): qty 2

## 2017-11-12 MED ORDER — LIDOCAINE 2% (20 MG/ML) 5 ML SYRINGE
INTRAMUSCULAR | Status: AC
  Filled 2017-11-12: qty 5

## 2017-11-12 MED ORDER — CHLORHEXIDINE GLUCONATE 0.12 % MT SOLN
15.0000 mL | OROMUCOSAL | Status: AC
  Administered 2017-11-12: 15 mL via OROMUCOSAL

## 2017-11-12 MED ORDER — DEXTROSE 5 % IV SOLN
INTRAVENOUS | Status: DC | PRN
  Administered 2017-11-12: 750 mg via INTRAVENOUS

## 2017-11-12 MED ORDER — MIDAZOLAM HCL 5 MG/5ML IJ SOLN
INTRAMUSCULAR | Status: DC | PRN
  Administered 2017-11-12: 3 mg via INTRAVENOUS
  Administered 2017-11-12: 5 mg via INTRAVENOUS
  Administered 2017-11-12: 2 mg via INTRAVENOUS

## 2017-11-12 MED ORDER — ROCURONIUM BROMIDE 10 MG/ML (PF) SYRINGE
PREFILLED_SYRINGE | INTRAVENOUS | Status: AC
  Filled 2017-11-12: qty 5

## 2017-11-12 MED ORDER — ASPIRIN EC 325 MG PO TBEC
325.0000 mg | DELAYED_RELEASE_TABLET | Freq: Every day | ORAL | Status: DC
  Administered 2017-11-13 – 2017-11-17 (×5): 325 mg via ORAL
  Filled 2017-11-12 (×5): qty 1

## 2017-11-12 MED ORDER — DEXTROSE 5 % IV SOLN
1.5000 g | Freq: Two times a day (BID) | INTRAVENOUS | Status: AC
  Administered 2017-11-12 – 2017-11-14 (×4): 1.5 g via INTRAVENOUS
  Filled 2017-11-12 (×4): qty 1.5

## 2017-11-12 MED ORDER — MORPHINE SULFATE (PF) 2 MG/ML IV SOLN: 2.0000 mg | INTRAVENOUS | Status: DC | PRN

## 2017-11-12 MED ORDER — CALCIUM CHLORIDE 10 % IV SOLN
1.0000 g | Freq: Once | INTRAVENOUS | Status: AC
  Administered 2017-11-12: 1 g via INTRAVENOUS

## 2017-11-12 MED ORDER — INSULIN REGULAR HUMAN 100 UNIT/ML IJ SOLN
INTRAMUSCULAR | Status: DC
  Filled 2017-11-12: qty 1

## 2017-11-12 MED ORDER — HEPARIN SODIUM (PORCINE) 1000 UNIT/ML IJ SOLN
INTRAMUSCULAR | Status: DC | PRN
  Administered 2017-11-12: 2000 [IU] via INTRAVENOUS
  Administered 2017-11-12: 5000 [IU] via INTRAVENOUS
  Administered 2017-11-12: 29000 [IU] via INTRAVENOUS

## 2017-11-12 MED ORDER — INSULIN REGULAR BOLUS VIA INFUSION
0.0000 [IU] | Freq: Three times a day (TID) | INTRAVENOUS | Status: DC
  Filled 2017-11-12: qty 10

## 2017-11-12 MED ORDER — AMIODARONE HCL IN DEXTROSE 360-4.14 MG/200ML-% IV SOLN
60.0000 mg/h | INTRAVENOUS | Status: DC
  Administered 2017-11-12 (×2): 60 mg/h via INTRAVENOUS
  Filled 2017-11-12 (×2): qty 200

## 2017-11-12 MED ORDER — EPHEDRINE 5 MG/ML INJ
INTRAVENOUS | Status: AC
  Filled 2017-11-12: qty 10

## 2017-11-12 MED ORDER — BISACODYL 10 MG RE SUPP: 10.0000 mg | Freq: Every day | RECTAL | Status: DC

## 2017-11-12 MED ORDER — ASPIRIN 81 MG PO CHEW: 324.0000 mg | CHEWABLE_TABLET | Freq: Every day | ORAL | Status: DC

## 2017-11-12 MED ORDER — FENTANYL CITRATE (PF) 250 MCG/5ML IJ SOLN
INTRAMUSCULAR | Status: DC | PRN
  Administered 2017-11-12: 50 ug via INTRAVENOUS
  Administered 2017-11-12: 100 ug via INTRAVENOUS
  Administered 2017-11-12: 50 ug via INTRAVENOUS
  Administered 2017-11-12: 150 ug via INTRAVENOUS
  Administered 2017-11-12 (×3): 50 ug via INTRAVENOUS
  Administered 2017-11-12: 100 ug via INTRAVENOUS
  Administered 2017-11-12: 150 ug via INTRAVENOUS
  Administered 2017-11-12: 100 ug via INTRAVENOUS
  Administered 2017-11-12: 250 ug via INTRAVENOUS
  Administered 2017-11-12: 150 ug via INTRAVENOUS
  Administered 2017-11-12: 50 ug via INTRAVENOUS

## 2017-11-12 MED ORDER — LACTATED RINGERS IV SOLN
INTRAVENOUS | Status: DC | PRN
  Administered 2017-11-12 (×2): via INTRAVENOUS

## 2017-11-12 SURGICAL SUPPLY — 98 items
ADAPTER CARDIO PERF ANTE/RETRO (ADAPTER) ×4 IMPLANT
BAG DECANTER FOR FLEXI CONT (MISCELLANEOUS) ×4 IMPLANT
BANDAGE ACE 4X5 VEL STRL LF (GAUZE/BANDAGES/DRESSINGS) ×4 IMPLANT
BANDAGE ACE 6X5 VEL STRL LF (GAUZE/BANDAGES/DRESSINGS) ×4 IMPLANT
BASKET HEART  (ORDER IN 25'S) (MISCELLANEOUS) ×1
BASKET HEART (ORDER IN 25'S) (MISCELLANEOUS) ×1
BASKET HEART (ORDER IN 25S) (MISCELLANEOUS) ×2 IMPLANT
BLADE CLIPPER SURG (BLADE) IMPLANT
BLADE EYE CATARACT 19 1.4 BEAV (BLADE) ×4 IMPLANT
BLADE NEEDLE 3 SS STRL (BLADE) ×3 IMPLANT
BLADE NEEDLE 3MM SS STRL (BLADE) ×1
BLADE STERNUM SYSTEM 6 (BLADE) ×4 IMPLANT
BLADE SURG 12 STRL SS (BLADE) ×4 IMPLANT
BNDG GAUZE ELAST 4 BULKY (GAUZE/BANDAGES/DRESSINGS) ×4 IMPLANT
CANISTER SUCT 3000ML PPV (MISCELLANEOUS) ×4 IMPLANT
CANNULA GUNDRY RCSP 15FR (MISCELLANEOUS) ×4 IMPLANT
CATH CPB KIT VANTRIGT (MISCELLANEOUS) ×4 IMPLANT
CATH ROBINSON RED A/P 18FR (CATHETERS) ×12 IMPLANT
CATH THORACIC 36FR RT ANG (CATHETERS) ×4 IMPLANT
CLIP VESOCCLUDE SM WIDE 24/CT (CLIP) ×4 IMPLANT
CRADLE DONUT ADULT HEAD (MISCELLANEOUS) ×4 IMPLANT
DRAIN CHANNEL 32F RND 10.7 FF (WOUND CARE) ×4 IMPLANT
DRAPE CARDIOVASCULAR INCISE (DRAPES) ×2
DRAPE SLUSH/WARMER DISC (DRAPES) ×4 IMPLANT
DRAPE SRG 135X102X78XABS (DRAPES) ×2 IMPLANT
DRSG AQUACEL AG ADV 3.5X14 (GAUZE/BANDAGES/DRESSINGS) ×4 IMPLANT
ELECT BLADE 4.0 EZ CLEAN MEGAD (MISCELLANEOUS) ×4
ELECT BLADE 6.5 EXT (BLADE) ×4 IMPLANT
ELECT CAUTERY BLADE 6.4 (BLADE) ×4 IMPLANT
ELECT REM PT RETURN 9FT ADLT (ELECTROSURGICAL) ×8
ELECTRODE BLDE 4.0 EZ CLN MEGD (MISCELLANEOUS) ×2 IMPLANT
ELECTRODE REM PT RTRN 9FT ADLT (ELECTROSURGICAL) ×4 IMPLANT
FELT TEFLON 1X6 (MISCELLANEOUS) ×8 IMPLANT
FLOSEAL 10ML (HEMOSTASIS) ×4 IMPLANT
GAUZE SPONGE 4X4 12PLY STRL (GAUZE/BANDAGES/DRESSINGS) ×8 IMPLANT
GLOVE BIO SURGEON STRL SZ 6.5 (GLOVE) ×12 IMPLANT
GLOVE BIO SURGEON STRL SZ7.5 (GLOVE) ×32 IMPLANT
GLOVE BIO SURGEON STRL SZ8.5 (GLOVE) ×16 IMPLANT
GLOVE BIO SURGEONS STRL SZ 6.5 (GLOVE) ×4
GLOVE BIOGEL PI IND STRL 8.5 (GLOVE) ×4 IMPLANT
GLOVE BIOGEL PI INDICATOR 8.5 (GLOVE) ×4
GOWN STRL REUS W/ TWL LRG LVL3 (GOWN DISPOSABLE) ×8 IMPLANT
GOWN STRL REUS W/TWL 2XL LVL3 (GOWN DISPOSABLE) ×16 IMPLANT
GOWN STRL REUS W/TWL LRG LVL3 (GOWN DISPOSABLE) ×8
HEMOSTAT POWDER SURGIFOAM 1G (HEMOSTASIS) ×12 IMPLANT
HEMOSTAT SURGICEL 2X14 (HEMOSTASIS) ×4 IMPLANT
INSERT FOGARTY XLG (MISCELLANEOUS) IMPLANT
KIT BASIN OR (CUSTOM PROCEDURE TRAY) ×4 IMPLANT
KIT ROOM TURNOVER OR (KITS) ×4 IMPLANT
KIT SUCTION CATH 14FR (SUCTIONS) ×4 IMPLANT
KIT VASOVIEW HEMOPRO VH 3000 (KITS) ×4 IMPLANT
LEAD PACING MYOCARDI (MISCELLANEOUS) ×4 IMPLANT
MARKER GRAFT CORONARY BYPASS (MISCELLANEOUS) ×12 IMPLANT
NS IRRIG 1000ML POUR BTL (IV SOLUTION) ×24 IMPLANT
PACK E OPEN HEART (SUTURE) ×4 IMPLANT
PACK OPEN HEART (CUSTOM PROCEDURE TRAY) ×4 IMPLANT
PAD ARMBOARD 7.5X6 YLW CONV (MISCELLANEOUS) ×8 IMPLANT
PAD ELECT DEFIB RADIOL ZOLL (MISCELLANEOUS) ×4 IMPLANT
PENCIL BUTTON HOLSTER BLD 10FT (ELECTRODE) ×4 IMPLANT
PUNCH AORTIC ROTATE 4.0MM (MISCELLANEOUS) IMPLANT
PUNCH AORTIC ROTATE 4.5MM 8IN (MISCELLANEOUS) ×4 IMPLANT
PUNCH AORTIC ROTATE 5MM 8IN (MISCELLANEOUS) IMPLANT
SET CARDIOPLEGIA MPS 5001102 (MISCELLANEOUS) ×4 IMPLANT
SURGIFLO W/THROMBIN 8M KIT (HEMOSTASIS) IMPLANT
SUT BONE WAX W31G (SUTURE) ×4 IMPLANT
SUT MNCRL AB 4-0 PS2 18 (SUTURE) IMPLANT
SUT PROLENE 3 0 SH DA (SUTURE) IMPLANT
SUT PROLENE 3 0 SH1 36 (SUTURE) IMPLANT
SUT PROLENE 4 0 RB 1 (SUTURE) ×2
SUT PROLENE 4 0 SH DA (SUTURE) ×4 IMPLANT
SUT PROLENE 4-0 RB1 .5 CRCL 36 (SUTURE) ×2 IMPLANT
SUT PROLENE 5 0 C 1 36 (SUTURE) IMPLANT
SUT PROLENE 6 0 C 1 30 (SUTURE) ×12 IMPLANT
SUT PROLENE 6 0 CC (SUTURE) ×12 IMPLANT
SUT PROLENE 8 0 BV175 6 (SUTURE) ×8 IMPLANT
SUT PROLENE BLUE 7 0 (SUTURE) ×8 IMPLANT
SUT PROLENE POLY MONO (SUTURE) ×8 IMPLANT
SUT SILK  1 MH (SUTURE)
SUT SILK 1 MH (SUTURE) IMPLANT
SUT SILK 2 0 SH CR/8 (SUTURE) ×4 IMPLANT
SUT SILK 3 0 SH CR/8 (SUTURE) IMPLANT
SUT STEEL 6MS V (SUTURE) ×8 IMPLANT
SUT STEEL SZ 6 DBL 3X14 BALL (SUTURE) ×4 IMPLANT
SUT VIC AB 1 CTX 36 (SUTURE) ×8
SUT VIC AB 1 CTX36XBRD ANBCTR (SUTURE) ×8 IMPLANT
SUT VIC AB 2-0 CT1 27 (SUTURE) ×2
SUT VIC AB 2-0 CT1 TAPERPNT 27 (SUTURE) ×2 IMPLANT
SUT VIC AB 2-0 CTX 27 (SUTURE) IMPLANT
SUT VIC AB 3-0 X1 27 (SUTURE) ×4 IMPLANT
SYSTEM SAHARA CHEST DRAIN ATS (WOUND CARE) ×4 IMPLANT
TAPE CLOTH SURG 4X10 WHT LF (GAUZE/BANDAGES/DRESSINGS) ×4 IMPLANT
TAPE PAPER 2X10 WHT MICROPORE (GAUZE/BANDAGES/DRESSINGS) ×4 IMPLANT
TOWEL GREEN STERILE (TOWEL DISPOSABLE) ×4 IMPLANT
TOWEL GREEN STERILE FF (TOWEL DISPOSABLE) ×4 IMPLANT
TRAY FOLEY SILVER 16FR TEMP (SET/KITS/TRAYS/PACK) ×4 IMPLANT
TUBING INSUFFLATION (TUBING) ×4 IMPLANT
UNDERPAD 30X30 (UNDERPADS AND DIAPERS) ×4 IMPLANT
WATER STERILE IRR 1000ML POUR (IV SOLUTION) ×8 IMPLANT

## 2017-11-12 NOTE — Progress Notes (Signed)
ABG called to Dr. [name], orders received for 1 amp Sodium Bicarb, increase RR to 16, repeat ABG at 1630 and page [facility]ielle [facility]. Orders enacted. Verbal order Doree Fudgeonielle Zimmerman PAC ok to add 1 amp Calcium to next bottle of Albumin due to low CI. PA to see pt. Will continue to closely monitor patient. Temitope Griffing, Blanchard KelchStephanie Ingold

## 2017-11-12 NOTE — Plan of Care (Signed)
Pt up ad lib. Ambulates independently with ease. Educated on use of call light system. Verbalizes understanding of need to call for assistance prior to ambulation when necessary. 

## 2017-11-12 NOTE — Progress Notes (Signed)
TCTS BRIEF SICU PROGRESS NOTE  Day of Surgery  S/P Procedure(s) (LRB): CORONARY ARTERY BYPASS GRAFTING (CABG) x Three , using left internal mammary artery and right leg greater saphenous vein harvested endoscopically (N/A) TRANSESOPHAGEAL ECHOCARDIOGRAM (TEE) (N/A)   Sedated on vent NSR w/ stable hemodynamics on dopamine and Neo drips O2 sats 100% Chest tube output low UOP excellent  Plan: Continue routine early postop  Purcell Nailslarence H Owen, MD 11/12/2017 7:58 PM

## 2017-11-12 NOTE — Progress Notes (Signed)
VT increased per MD based on ABG results.

## 2017-11-12 NOTE — Progress Notes (Signed)
RR decreased to 12 per MD.

## 2017-11-12 NOTE — Progress Notes (Signed)
Pre Procedure note for inpatients:   Caitlyn RobsonJoanna D Lindholm has been scheduled for Procedure(s): CORONARY ARTERY BYPASS GRAFTING (CABG) (N/A) TRANSESOPHAGEAL ECHOCARDIOGRAM (TEE) (N/A) today. The various methods of treatment have been discussed with the patient. After consideration of the risks, benefits and treatment options the patient has consented to the planned procedure.   The patient has been seen and labs reviewed. There are no changes in the patient's condition to prevent proceeding with the planned procedure today.  Recent labs:  Lab Results  Component Value Date   WBC 13.4 (H) 11/12/2017   HGB 12.8 11/12/2017   HCT 40.0 11/12/2017   PLT 328 11/12/2017   GLUCOSE 116 (H) 11/12/2017   CHOL 228 (H) 11/04/2017   TRIG 110 11/04/2017   HDL 34 (L) 11/04/2017   LDLCALC 172 (H) 11/04/2017   ALT 27 11/11/2017   AST 22 11/11/2017   NA 138 11/12/2017   K 3.9 11/12/2017   CL 104 11/12/2017   CREATININE 1.18 (H) 11/12/2017   BUN 14 11/12/2017   CO2 24 11/12/2017   INR 1.03 11/06/2017   HGBA1C 6.8 (H) 11/11/2017    Mikey BussingPeter Van Trigt III, MD 11/12/2017 7:04 AM

## 2017-11-12 NOTE — Anesthesia Procedure Notes (Signed)
Procedure Name: Intubation Date/Time: 11/12/2017 8:59 AM Performed by: Rosiland OzMeyers, Alaura Schippers, CRNA Pre-anesthesia Checklist: Patient identified, Emergency Drugs available, Suction available, Patient being monitored and Timeout performed Patient Re-evaluated:Patient Re-evaluated prior to induction Oxygen Delivery Method: Circle system utilized Preoxygenation: Pre-oxygenation with 100% oxygen Induction Type: IV induction Ventilation: Mask ventilation without difficulty Laryngoscope Size: Miller and 3 Grade View: Grade I Tube type: Oral Tube size: 7.5 mm Number of attempts: 1 Airway Equipment and Method: Stylet Placement Confirmation: ETT inserted through vocal cords under direct vision,  positive ETCO2 and breath sounds checked- equal and bilateral Secured at: 21 cm Tube secured with: Tape Dental Injury: Teeth and Oropharynx as per pre-operative assessment

## 2017-11-12 NOTE — Procedures (Signed)
Extubation Procedure Note  Patient Details:   Name: Caitlyn RobsonJoanna D Krontz DOB: 02-27-1961 MRN: [id number]   Airway Documentation:     Evaluation  O2 sats: stable throughout Complications: No apparent complications Patient did tolerate procedure well. Bilateral Breath Sounds: Clear, Diminished   Yes, pt able to cough to clear secretions and hoarsely vocalize name. Pt placed on 5L nasal cannula and tolerating well. Pt positive for cuff leak before extubation with a NIF of -32 and VC of 1-1.5L   Tacy Learnurriff, Nusrat Encarnacion E 11/12/2017, 11:36 PM

## 2017-11-12 NOTE — Progress Notes (Signed)
11/12/2017 1630 Dr. [name] at bedside, ABG results reviewed. Verbal order to increase TV to 600. RT updated. Orders received to repeat ABG in one hour. Shylah Dossantos, Blanchard KelchStephanie Ingold

## 2017-11-12 NOTE — OR Nursing (Signed)
14:10 - 20 minute call to SICU charge nurse

## 2017-11-12 NOTE — Progress Notes (Signed)
RR increased per MD.

## 2017-11-12 NOTE — Transfer of Care (Signed)
Immediate Anesthesia Transfer of Care Note  Patient: Caitlyn Herrera  Procedure(s) Performed: CORONARY ARTERY BYPASS GRAFTING (CABG) x Three , using left internal mammary artery and right leg greater saphenous vein harvested endoscopically (N/A Chest) TRANSESOPHAGEAL ECHOCARDIOGRAM (TEE) (N/A )  Patient Location: SICU  Anesthesia Type:General  Level of Consciousness: sedated and Patient remains intubated per anesthesia plan  Airway & Oxygen Therapy: Patient remains intubated per anesthesia plan and Patient placed on Ventilator (see vital sign flow sheet for setting)  Post-op Assessment: Report given to RN and Post -op Vital signs reviewed and stable  Post vital signs: Reviewed and stable  Last Vitals:  Vitals:   11/12/17 0511 11/12/17 1440  BP: 117/90   Pulse: 70 87  Resp: 18 12  Temp: 36.4 C   SpO2: 100% 100%    Last Pain:  Vitals:   11/12/17 0511  TempSrc: Oral  PainSc:       Patients Stated Pain Goal: 0 (11/10/17 2105)  Complications: No apparent anesthesia complications

## 2017-11-12 NOTE — OR Nursing (Signed)
13:35 - 45 minute call to SICU 

## 2017-11-12 NOTE — Progress Notes (Signed)
I was asked to see patient for Dr. [name] is he is in the OR. Patient has been acidotic since arriving to ICU. She has been given bicarb. The nurse is going to give another bicarb and add Calcium. Her CI has been under 15.;however last CI improved to 1.72. CO 3.37 (results as of 3:56 pm). Will drawn another CI/CO after next bicarbonate and Calcium given. Another ABG to be checked around 1630 and based on results, orders to be given accordingly. Also, patient appears to be in CHB and pacer seems to be functioning appropriately. Will discontinue scheduled Lopressor. Continue to monitor.

## 2017-11-12 NOTE — Anesthesia Procedure Notes (Signed)
Arterial Line Insertion Start/End1/16/2019 7:40 AM, 11/12/2017 7:50 AM Performed by: Rosiland OzMeyers, Bakari Nikolai, CRNA, CRNA  Patient location: Pre-op. Preanesthetic checklist: patient identified, IV checked, site marked, risks and benefits discussed, surgical consent, monitors and equipment checked, pre-op evaluation, timeout performed and anesthesia consent Lidocaine 1% used for infiltration Left, radial was placed Catheter size: 18 G Hand hygiene performed , maximum sterile barriers used  and Seldinger technique used  Attempts: 1 Procedure performed without using ultrasound guided technique. Following insertion, dressing applied and Biopatch. Post procedure assessment: normal  Patient tolerated the procedure well with no immediate complications.

## 2017-11-12 NOTE — Brief Op Note (Signed)
11/05/2017 - 11/12/2017  8:09 AM  PATIENT:  Birder RobsonJoanna D Criado  57 y.o. female  PRE-OPERATIVE DIAGNOSIS:  CAD  POST-OPERATIVE DIAGNOSIS:  CAD  PROCEDURE:  Procedure(s): CORONARY ARTERY BYPASS GRAFTING (CABG) x Three , using left internal mammary artery and right leg greater saphenous vein harvested endoscopically (N/A) TRANSESOPHAGEAL ECHOCARDIOGRAM (TEE) (N/A)  SURGEON:  Surgeon(s) and Role:    Kerin Perna* Van Trigt, Peter, MD - Primary  PHYSICIAN ASSISTANT: Runell Kovich PA-C  ANESTHESIA:   general  EBL:  400 mL   BLOOD ADMINISTERED:none  DRAINS: MEDIASTINAL AND PLEURAL CHEST TUBES   LOCAL MEDICATIONS USED:  NONE  SPECIMEN:  No Specimen  DISPOSITION OF SPECIMEN:  N/A  COUNTS:  YES  TOURNIQUET:  * No tourniquets in log *  DICTATION: .Other Dictation: Dictation Number PENDING  PLAN OF CARE: Admit to inpatient   PATIENT DISPOSITION:  ICU - intubated and hemodynamically stable.   Delay start of Pharmacological VTE agent (>24hrs) due to surgical blood loss or risk of bleeding: yes

## 2017-11-12 NOTE — Anesthesia Preprocedure Evaluation (Addendum)
Anesthesia Evaluation  Patient identified by MRN, date of birth, ID band Patient awake    Reviewed: Allergy & Precautions, NPO status , Patient's Chart, lab work & pertinent test results, reviewed documented beta blocker date and time   Airway Mallampati: II  TM Distance: >3 FB Neck ROM: Full    Dental  (+) Edentulous Upper, Edentulous Lower   Pulmonary Current Smoker,    Pulmonary exam normal breath sounds clear to auscultation       Cardiovascular hypertension, Pt. on home beta blockers + CAD, + Past MI and + Peripheral Vascular Disease  Normal cardiovascular exam Rhythm:Regular Rate:Normal  ECG: NSR, rate 79  Conclusions: Severe, three-vessel coronary artery disease, as detailed below. Upper normal left ventricular filling pressure.  ECHO:  Left ventricle: The cavity size was normal. Wall thickness was increased in a pattern of moderate LVH. Systolic function was vigorous. The estimated ejection fraction was in the range of 65% to 70%. Wall motion was normal; there were no regional wall motion abnormalities. Left ventricular diastolic function parameters were normal. Right ventricle: The cavity size was normal. Systolic function was normal. Tricuspid valve: There was mild regurgitation.    Neuro/Psych negative neurological ROS  negative psych ROS   GI/Hepatic negative GI ROS, Neg liver ROS,   Endo/Other  diabetes  Renal/GU negative Renal ROS     Musculoskeletal negative musculoskeletal ROS (+)   Abdominal   Peds  Hematology HLD   Anesthesia Other Findings   Reproductive/Obstetrics                          Anesthesia Physical Anesthesia Plan  ASA: IV  Anesthesia Plan: General   Post-op Pain Management:    Induction: Intravenous  PONV Risk Score and Plan: 2 and Treatment may vary due to age or medical condition and Midazolam  Airway Management Planned: Oral ETT  Additional  Equipment: Arterial line, CVP, PA Cath, TEE and Ultrasound Guidance Line Placement  Intra-op Plan:   Post-operative Plan: Post-operative intubation/ventilation  Informed Consent: I have reviewed the patients History and Physical, chart, labs and discussed the procedure including the risks, benefits and alternatives for the proposed anesthesia with the patient or authorized representative who has indicated his/her understanding and acceptance.   Dental advisory given  Plan Discussed with: CRNA  Anesthesia Plan Comments:         Anesthesia Quick Evaluation

## 2017-11-12 NOTE — Progress Notes (Addendum)
11/12/2017 1750 Repeat ABG results called to MD. Verbal order received to decrease RR back to 12. RT updated and orders enacted. MD made aware of CI less than 1.8 and liable BP. Orders received to repeat ABG in one hour as well as ok to give total of 6 Albumin if needed. If ABG within parameters and VSS ok to initiate weaning. Orders enacted. Will continue to closely monitor patient.  Emrey Thornley, Blanchard KelchStephanie Ingold

## 2017-11-13 ENCOUNTER — Inpatient Hospital Stay (HOSPITAL_COMMUNITY): Payer: BLUE CROSS/BLUE SHIELD

## 2017-11-13 ENCOUNTER — Encounter (HOSPITAL_COMMUNITY): Payer: Self-pay | Admitting: Cardiothoracic Surgery

## 2017-11-13 LAB — POCT I-STAT 3, ART BLOOD GAS (G3+)
Acid-Base Excess: 1 mmol/L (ref 0.0–2.0)
Acid-base deficit: 1 mmol/L (ref 0.0–2.0)
Acid-base deficit: 2 mmol/L (ref 0.0–2.0)
Acid-base deficit: 3 mmol/L — ABNORMAL HIGH (ref 0.0–2.0)
Bicarbonate: 23 mmol/L (ref 20.0–28.0)
Bicarbonate: 23.2 mmol/L (ref 20.0–28.0)
Bicarbonate: 24 mmol/L (ref 20.0–28.0)
Bicarbonate: 26.7 mmol/L (ref 20.0–28.0)
O2 Saturation: 100 %
O2 Saturation: 100 %
O2 Saturation: 97 %
O2 Saturation: 99 %
Patient temperature: 36.4
Patient temperature: 37.9
Patient temperature: 38.2
Patient temperature: 38.2
TCO2: 24 mmol/L (ref 22–32)
TCO2: 24 mmol/L (ref 22–32)
TCO2: 25 mmol/L (ref 22–32)
TCO2: 28 mmol/L (ref 22–32)
pCO2 arterial: 40.1 mmHg (ref 32.0–48.0)
pCO2 arterial: 43.2 mmHg (ref 32.0–48.0)
pCO2 arterial: 47 mmHg (ref 32.0–48.0)
pCO2 arterial: 50.5 mmHg — ABNORMAL HIGH (ref 32.0–48.0)
pH, Arterial: 7.303 — ABNORMAL LOW (ref 7.350–7.450)
pH, Arterial: 7.336 — ABNORMAL LOW (ref 7.350–7.450)
pH, Arterial: 7.343 — ABNORMAL LOW (ref 7.350–7.450)
pH, Arterial: 7.382 (ref 7.350–7.450)
pO2, Arterial: 100 mmHg (ref 83.0–108.0)
pO2, Arterial: 145 mmHg — ABNORMAL HIGH (ref 83.0–108.0)
pO2, Arterial: 200 mmHg — ABNORMAL HIGH (ref 83.0–108.0)
pO2, Arterial: 404 mmHg — ABNORMAL HIGH (ref 83.0–108.0)

## 2017-11-13 LAB — POCT I-STAT, CHEM 8
BUN: 9 mg/dL (ref 6–20)
BUN: 9 mg/dL (ref 6–20)
Calcium, Ion: 1.26 mmol/L (ref 1.15–1.40)
Calcium, Ion: 1.31 mmol/L (ref 1.15–1.40)
Chloride: 104 mmol/L (ref 101–111)
Chloride: 99 mmol/L — ABNORMAL LOW (ref 101–111)
Creatinine, Ser: 0.8 mg/dL (ref 0.44–1.00)
Creatinine, Ser: 0.9 mg/dL (ref 0.44–1.00)
Glucose, Bld: 133 mg/dL — ABNORMAL HIGH (ref 65–99)
Glucose, Bld: 149 mg/dL — ABNORMAL HIGH (ref 65–99)
HCT: 30 % — ABNORMAL LOW (ref 36.0–46.0)
HCT: 31 % — ABNORMAL LOW (ref 36.0–46.0)
Hemoglobin: 10.2 g/dL — ABNORMAL LOW (ref 12.0–15.0)
Hemoglobin: 10.5 g/dL — ABNORMAL LOW (ref 12.0–15.0)
Potassium: 4 mmol/L (ref 3.5–5.1)
Potassium: 3.8 mmol/L (ref 3.5–5.1)
Sodium: 142 mmol/L (ref 135–145)
Sodium: 137 mmol/L (ref 135–145)
TCO2: 25 mmol/L (ref 22–32)
TCO2: 23 mmol/L (ref 22–32)

## 2017-11-13 LAB — CBC
HCT: 29.4 % — ABNORMAL LOW (ref 36.0–46.0)
HEMATOCRIT: 30 % — AB (ref 36.0–46.0)
HEMOGLOBIN: 9.8 g/dL — AB (ref 12.0–15.0)
Hemoglobin: 9.7 g/dL — ABNORMAL LOW (ref 12.0–15.0)
MCH: 31.3 pg (ref 26.0–34.0)
MCH: 31.6 pg (ref 26.0–34.0)
MCHC: 33 g/dL (ref 30.0–36.0)
MCHC: 32.7 g/dL (ref 30.0–36.0)
MCV: 94.8 fL (ref 78.0–100.0)
MCV: 96.8 fL (ref 78.0–100.0)
PLATELETS: 188 10*3/uL (ref 150–400)
Platelets: 180 10*3/uL (ref 150–400)
RBC: 3.1 MIL/uL — ABNORMAL LOW (ref 3.87–5.11)
RBC: 3.1 MIL/uL — ABNORMAL LOW (ref 3.87–5.11)
RDW: 13.2 % (ref 11.5–15.5)
RDW: 13.7 % (ref 11.5–15.5)
WBC: 13.3 10*3/uL — ABNORMAL HIGH (ref 4.0–10.5)
WBC: 13.1 10*3/uL — ABNORMAL HIGH (ref 4.0–10.5)

## 2017-11-13 LAB — CREATININE, SERUM
Creatinine, Ser: 1.08 mg/dL — ABNORMAL HIGH (ref 0.44–1.00)
GFR calc Af Amer: >60 mL/min (ref >60)
GFR calc non Af Amer: 55 mL/min — ABNORMAL LOW (ref >60)

## 2017-11-13 LAB — BASIC METABOLIC PANEL
ANION GAP: 8 (ref 5–15)
BUN: 8 mg/dL (ref 6–20)
CO2: 22 mmol/L (ref 22–32)
Calcium: 8.6 mg/dL — ABNORMAL LOW (ref 8.9–10.3)
Chloride: 108 mmol/L (ref 101–111)
Creatinine, Ser: 0.91 mg/dL (ref 0.44–1.00)
Glucose, Bld: 133 mg/dL — ABNORMAL HIGH (ref 65–99)
Potassium: 4 mmol/L (ref 3.5–5.1)
Sodium: 138 mmol/L (ref 135–145)

## 2017-11-13 LAB — GLUCOSE, CAPILLARY
Glucose-Capillary: 121 mg/dL — ABNORMAL HIGH (ref 65–99)
Glucose-Capillary: 90 mg/dL (ref 65–99)
Glucose-Capillary: 121 mg/dL — ABNORMAL HIGH (ref 65–99)
Glucose-Capillary: 137 mg/dL — ABNORMAL HIGH (ref 65–99)
Glucose-Capillary: 127 mg/dL — ABNORMAL HIGH (ref 65–99)
Glucose-Capillary: 127 mg/dL — ABNORMAL HIGH (ref 65–99)
Glucose-Capillary: 132 mg/dL — ABNORMAL HIGH (ref 65–99)
Glucose-Capillary: 125 mg/dL — ABNORMAL HIGH (ref 65–99)
Glucose-Capillary: 167 mg/dL — ABNORMAL HIGH (ref 65–99)
Glucose-Capillary: 151 mg/dL — ABNORMAL HIGH (ref 65–99)
Glucose-Capillary: 69 mg/dL (ref 65–99)
Glucose-Capillary: 145 mg/dL — ABNORMAL HIGH (ref 65–99)
Glucose-Capillary: 153 mg/dL — ABNORMAL HIGH (ref 65–99)

## 2017-11-13 LAB — MAGNESIUM
Magnesium: 2.2 mg/dL (ref 1.7–2.4)
Magnesium: 2 mg/dL (ref 1.7–2.4)

## 2017-11-13 MED ORDER — INSULIN ASPART 100 UNIT/ML ~~LOC~~ SOLN: 4.0000 [IU] | Freq: Three times a day (TID) | SUBCUTANEOUS | Status: DC

## 2017-11-13 MED ORDER — INSULIN ASPART 100 UNIT/ML ~~LOC~~ SOLN
0.0000 [IU] | SUBCUTANEOUS | Status: DC
  Administered 2017-11-13 – 2017-11-14 (×4): 2 [IU] via SUBCUTANEOUS

## 2017-11-13 MED ORDER — ALBUMIN HUMAN 5 % IV SOLN
INTRAVENOUS | Status: AC
  Administered 2017-11-13: 12.5 g via INTRAVENOUS
  Filled 2017-11-13: qty 250

## 2017-11-13 MED ORDER — INSULIN DETEMIR 100 UNIT/ML ~~LOC~~ SOLN
12.0000 [IU] | Freq: Two times a day (BID) | SUBCUTANEOUS | Status: DC
  Administered 2017-11-13 – 2017-11-15 (×6): 12 [IU] via SUBCUTANEOUS
  Filled 2017-11-13 (×8): qty 0.12

## 2017-11-13 MED ORDER — VANCOMYCIN HCL IN DEXTROSE 1-5 GM/200ML-% IV SOLN
1000.0000 mg | Freq: Once | INTRAVENOUS | Status: AC
  Administered 2017-11-13: 1000 mg via INTRAVENOUS
  Filled 2017-11-13: qty 200

## 2017-11-13 MED ORDER — ALBUMIN HUMAN 5 % IV SOLN
12.5000 g | Freq: Once | INTRAVENOUS | Status: AC
  Administered 2017-11-13: 12.5 g via INTRAVENOUS

## 2017-11-13 MED ORDER — AMIODARONE HCL 200 MG PO TABS
200.0000 mg | ORAL_TABLET | Freq: Two times a day (BID) | ORAL | Status: DC
Start: 2017-11-13 — End: 2017-11-14
  Administered 2017-11-13 – 2017-11-14 (×3): 200 mg via ORAL
  Filled 2017-11-13 (×3): qty 1

## 2017-11-13 MED ORDER — LIVING WELL WITH DIABETES BOOK
Freq: Once | Status: AC
  Administered 2017-11-13: 18:00:00
  Filled 2017-11-13: qty 1

## 2017-11-13 MED ORDER — ORAL CARE MOUTH RINSE
15.0000 mL | Freq: Two times a day (BID) | OROMUCOSAL | Status: DC
  Administered 2017-11-13 – 2017-11-14 (×3): 15 mL via OROMUCOSAL

## 2017-11-13 NOTE — Progress Notes (Signed)
Patient ID: Caitlyn Herrera, female   DOB: 1961/07/30, 57 y.o.   MRN: [id number] [facility] Evening Rounds  Hemodynamically stable on dopamine 2.5  sats 100% on RA  Urine output ok  CT output low  BMET    Component Value Date/Time   NA 137 11/13/2017 1639   K 3.8 11/13/2017 1639   CL 99 (L) 11/13/2017 1639   CO2 22 11/13/2017 0354   GLUCOSE 149 (H) 11/13/2017 1639   BUN 9 11/13/2017 1639   CREATININE 0.90 11/13/2017 1639   CALCIUM 8.6 (L) 11/13/2017 0354   GFRNONAA 55 (L) 11/13/2017 1631   GFRAA >60 11/13/2017 1631    CBC    Component Value Date/Time   WBC 13.1 (H) 11/13/2017 1631   RBC 3.10 (L) 11/13/2017 1631   HGB 10.5 (L) 11/13/2017 1639   HCT 31.0 (L) 11/13/2017 1639   PLT 180 11/13/2017 1631   MCV 96.8 11/13/2017 1631   MCH 31.6 11/13/2017 1631   MCHC 32.7 11/13/2017 1631   RDW 13.7 11/13/2017 1631

## 2017-11-13 NOTE — Progress Notes (Signed)
Inpatient Diabetes Program Recommendations  AACE/ADA: New Consensus Statement on Inpatient Glycemic Control (2015)  Target Ranges:  Prepandial:   less than 140 mg/dL      Peak postprandial:   less than 180 mg/dL (1-2 hours)      Critically ill patients:  140 - 180 mg/dL   Lab Results  Component Value Date   GLUCAP 69 11/13/2017   HGBA1C 6.8 (H) 11/11/2017    Review of Glycemic Control Spoke with pt at length regarding new diagnosis of DM. Discussed HgbA1C of 6.8% and goals. Discussed how diet, exercise and stress management affect blood sugars. Discussed glucose monitoring and importance of f/u with PCP who will manage her DM. Pt states PTA she was thirsty and drank large amount of [employer]heuser-Busch[employer]ountain Dew and Pepsi. Thought these drinks would give her more energy, as pt felt tired all the time. Discussed importance of controlling blood sugars to prevent long-term complications. Pt seemed appreciative of visit and information.  Transitioned off drip to Levemir 12 units bid and Novolog 0-24 units Q4H.  Will f/u in am.   Thank you. Ailene Ardshonda Obi Scrima, RD, LDN, CDE Inpatient Diabetes Coordinator [phone]

## 2017-11-13 NOTE — Progress Notes (Signed)
[facility] DAILY ICU PROGRESS NOTE                   [address]            [place] [id number]          [phone]   1 Day Post-Op Procedure(s) (LRB): CORONARY ARTERY BYPASS GRAFTING (CABG) x Three , using left internal mammary artery and right leg greater saphenous vein harvested endoscopically (N/A) TRANSESOPHAGEAL ECHOCARDIOGRAM (TEE) (N/A)  Total Length of Stay:  LOS: 8 days   Subjective: Feels ok, some nausea   Objective: Vital signs in last 24 hours: Temp:  [96.3 F (35.7 C)-100.9 F (38.3 C)] 99.7 F (37.6 C) (01/17 0800) Pulse Rate:  [29-93] 92 (01/17 0800) Cardiac Rhythm: Normal sinus rhythm (01/17 0745) Resp:  [7-30] 16 (01/17 0800) BP: (88-130)/(47-94) 98/68 (01/17 0800) SpO2:  [100 %] 100 % (01/17 0800) Arterial Line BP: (84-125)/(49-78) 108/57 (01/17 0800) FiO2 (%):  [40 %-50 %] 40 % (01/16 2300) Weight:  [200 lb 4.8 oz (90.9 kg)] 200 lb 4.8 oz (90.9 kg) (01/17 0615)  Filed Weights   11/11/17 0527 11/12/17 0511 11/13/17 0615  Weight: 189 lb 14.4 oz (86.1 kg) 190 lb 4.8 oz (86.3 kg) 200 lb 4.8 oz (90.9 kg)    Weight change: 10 lb (4.536 kg)   Hemodynamic parameters for last 24 hours: PAP: (21-37)/(7-19) 22/10 CO:  [2.5 L/min-5.4 L/min] 5.2 L/min CI:  [1.3 L/min/m2-2.7 L/min/m2] 2.6 L/min/m2  Intake/Output from previous day: 01/16 0701 - 01/17 0700 In: 6356.5 [I.V.:4456.5; Blood:300; IV Piggyback:1600] Out: 5055 [Urine:4005; Blood:400; Chest Tube:650]  Intake/Output this shift: No intake/output data recorded.  Current Meds: Scheduled Meds: . acetaminophen  1,000 mg Oral Q6H   Or  . acetaminophen (TYLENOL) oral liquid 160 mg/5 mL  1,000 mg Per Tube Q6H  . amiodarone  200 mg Oral BID  . aspirin EC  325 mg Oral Daily   Or  . aspirin  324 mg Per Tube Daily  . atorvastatin  80 mg Oral q1800  . bisacodyl  10 mg Oral Daily   Or  . bisacodyl  10 mg Rectal Daily  . docusate sodium  200 mg Oral Daily  . insulin aspart  0-24 Units  Subcutaneous Q4H  . insulin detemir  12 Units Subcutaneous BID  . levalbuterol  1.25 mg Nebulization TID  . mouth rinse  15 mL Mouth Rinse BID  . metoCLOPramide (REGLAN) injection  10 mg Intravenous Q6H  . mometasone-formoterol  2 puff Inhalation BID  . [START ON 11/14/2017] pantoprazole  40 mg Oral Daily  . sodium chloride flush  3 mL Intravenous Q12H   Continuous Infusions: . sodium chloride    . sodium chloride Stopped (11/12/17 1410)  . sodium chloride 20 mL/hr at 11/13/17 0700  . cefUROXime (ZINACEF)  IV Stopped (11/13/17 [id number])  . DOPamine 2.5 mcg/kg/min (11/13/17 0700)  . famotidine (PEPCID) IV Stopped (11/12/17 1529)  . insulin (NOVOLIN-R) infusion 1.4 Units/hr (11/13/17 0754)  . lactated ringers 20 mL/hr at 11/13/17 0700  . lactated ringers 20 mL/hr at 11/13/17 0700  . nitroGLYCERIN Stopped (11/12/17 1532)  . phenylephrine (NEO-SYNEPHRINE) Adult infusion 20 mcg/min (11/13/17 0700)  . vancomycin     PRN Meds:.sodium chloride, metoprolol tartrate, midazolam, morphine injection, ondansetron (ZOFRAN) IV, oxyCODONE, sodium chloride flush, traMADol  General appearance: alert, cooperative and no distress Heart: regular rate and rhythm and no rub Lungs: dim in bases Abdomen: soft, nontender Extremities: trace edema Wound: dressings CDI  Lab Results: CBC: Recent Labs    11/12/17 2121 11/13/17 0354  WBC 11.8* 13.3*  HGB 10.3* 9.7*  HCT 30.4* 29.4*  PLT 178 188   BMET:  Recent Labs    11/12/17 0452  11/12/17 2054  11/12/17 2121 11/13/17 0354  NA 138   < > 142  --   --  138  K 3.9   < > 4.0  --   --  4.0  CL 104   < > 104  --   --  108  CO2 24  --   --   --   --  22  GLUCOSE 116*   < > 133*  --   --  133*  BUN 14   < > 9  --   --  8  CREATININE 1.18*   < > 0.80   < > 0.94 0.91  CALCIUM 9.7  --   --   --   --  8.6*   < > = values in this interval not displayed.    CMET: Lab Results  Component Value Date   WBC 13.3 (H) 11/13/2017   HGB 9.7 (L) 11/13/2017    HCT 29.4 (L) 11/13/2017   PLT 188 11/13/2017   GLUCOSE 133 (H) 11/13/2017   CHOL 228 (H) 11/04/2017   TRIG 110 11/04/2017   HDL 34 (L) 11/04/2017   LDLCALC 172 (H) 11/04/2017   ALT 27 11/11/2017   AST 22 11/11/2017   NA 138 11/13/2017   K 4.0 11/13/2017   CL 108 11/13/2017   CREATININE 0.91 11/13/2017   BUN 8 11/13/2017   CO2 22 11/13/2017   INR 1.39 11/12/2017   HGBA1C 6.8 (H) 11/11/2017      PT/INR:  Recent Labs    11/12/17 1454  LABPROT 16.9*  INR 1.39   Radiology: Dg Chest Port 1 View  Result Date: 11/12/2017 CLINICAL DATA:  Status post CABG today. EXAM: PORTABLE CHEST 1 VIEW COMPARISON:  PA and lateral chest 11/11/2017. FINDINGS: Endotracheal tube is in place with the tip in good position at the level of the clavicular heads. OG tube side port is in good position in the stomach. Right IJ approach Swan-Ganz catheter tip is in the pulmonary outflow tract. Mediastinal drain and left chest tube are noted. No pneumothorax. Mild bibasilar atelectasis is seen. Heart size is normal. No edema. IMPRESSION: Tip of right IJ approach Swan-Ganz catheter is in the pulmonary outflow tract. Support tubes and lines are otherwise unremarkable. No pneumothorax. Mild bibasilar atelectasis. Electronically Signed   By: Drusilla Kanner M.D.   On: 11/12/2017 15:29     Assessment/Plan: S/P Procedure(s) (LRB): CORONARY ARTERY BYPASS GRAFTING (CABG) x Three , using left internal mammary artery and right leg greater saphenous vein harvested endoscopically (N/A) TRANSESOPHAGEAL ECHOCARDIOGRAM (TEE) (N/A)  1 doing well 2 wean neo and dopamine, currently apaced 3 ABL anemia- monitor, not in transfusion zone 4 good UO, will probably need some diuretic, renal fxn is normal 5 pulm toilet and rehab- routine 6 lines and tube removal  7 insulin has been started, A1C 6.8 so will need good diet management, may benefit from metformin as she transitions from insulin  Rowe Clack 11/13/2017 8:34 AM

## 2017-11-14 ENCOUNTER — Inpatient Hospital Stay (HOSPITAL_COMMUNITY): Payer: BLUE CROSS/BLUE SHIELD

## 2017-11-14 LAB — GLUCOSE, CAPILLARY
Glucose-Capillary: 130 mg/dL — ABNORMAL HIGH (ref 65–99)
Glucose-Capillary: 106 mg/dL — ABNORMAL HIGH (ref 65–99)
Glucose-Capillary: 148 mg/dL — ABNORMAL HIGH (ref 65–99)
Glucose-Capillary: 113 mg/dL — ABNORMAL HIGH (ref 65–99)
Glucose-Capillary: 143 mg/dL — ABNORMAL HIGH (ref 65–99)
Glucose-Capillary: 136 mg/dL — ABNORMAL HIGH (ref 65–99)

## 2017-11-14 LAB — CBC
HCT: 29.2 % — ABNORMAL LOW (ref 36.0–46.0)
Hemoglobin: 9.8 g/dL — ABNORMAL LOW (ref 12.0–15.0)
MCH: 32.3 pg (ref 26.0–34.0)
MCHC: 33.6 g/dL (ref 30.0–36.0)
MCV: 96.4 fL (ref 78.0–100.0)
Platelets: 179 10*3/uL (ref 150–400)
RBC: 3.03 MIL/uL — ABNORMAL LOW (ref 3.87–5.11)
RDW: 13.3 % (ref 11.5–15.5)
WBC: 16.5 10*3/uL — ABNORMAL HIGH (ref 4.0–10.5)

## 2017-11-14 LAB — BASIC METABOLIC PANEL
Anion gap: 9 (ref 5–15)
BUN: 10 mg/dL (ref 6–20)
CO2: 24 mmol/L (ref 22–32)
Calcium: 9.1 mg/dL (ref 8.9–10.3)
Chloride: 102 mmol/L (ref 101–111)
Creatinine, Ser: 0.93 mg/dL (ref 0.44–1.00)
GFR calc Af Amer: >60 mL/min (ref >60)
GFR calc non Af Amer: >60 mL/min (ref >60)
Glucose, Bld: 142 mg/dL — ABNORMAL HIGH (ref 65–99)
Potassium: 4.2 mmol/L (ref 3.5–5.1)
Sodium: 135 mmol/L (ref 135–145)

## 2017-11-14 MED ORDER — AMIODARONE HCL 200 MG PO TABS
200.0000 mg | ORAL_TABLET | Freq: Every day | ORAL | Status: DC
  Administered 2017-11-15 – 2017-11-17 (×3): 200 mg via ORAL
  Filled 2017-11-14 (×3): qty 1

## 2017-11-14 MED ORDER — INSULIN ASPART 100 UNIT/ML ~~LOC~~ SOLN
0.0000 [IU] | Freq: Three times a day (TID) | SUBCUTANEOUS | Status: DC
  Administered 2017-11-14 (×2): 2 [IU] via SUBCUTANEOUS

## 2017-11-14 MED ORDER — MOVING RIGHT ALONG BOOK
Freq: Once | Status: AC
  Administered 2017-11-14: 09:00:00
  Filled 2017-11-14: qty 1

## 2017-11-14 MED ORDER — SODIUM CHLORIDE 0.9 % IV SOLN: 250.0000 mL | INTRAVENOUS | Status: DC | PRN

## 2017-11-14 MED ORDER — FUROSEMIDE 10 MG/ML IJ SOLN
40.0000 mg | Freq: Every day | INTRAMUSCULAR | Status: DC
  Administered 2017-11-14: 40 mg via INTRAVENOUS
  Filled 2017-11-14: qty 4

## 2017-11-14 MED ORDER — SODIUM CHLORIDE 0.9% FLUSH: 3.0000 mL | INTRAVENOUS | Status: DC | PRN

## 2017-11-14 MED ORDER — SODIUM CHLORIDE 0.9% FLUSH
3.0000 mL | Freq: Two times a day (BID) | INTRAVENOUS | Status: DC
  Administered 2017-11-14 – 2017-11-17 (×6): 3 mL via INTRAVENOUS

## 2017-11-14 MED FILL — Sodium Chloride IV Soln 0.9%: INTRAVENOUS | Qty: 2000 | Status: AC

## 2017-11-14 MED FILL — Magnesium Sulfate Inj 50%: INTRAMUSCULAR | Qty: 10 | Status: AC

## 2017-11-14 MED FILL — Electrolyte-R (PH 7.4) Solution: INTRAVENOUS | Qty: 3000 | Status: AC

## 2017-11-14 MED FILL — Potassium Chloride Inj 2 mEq/ML: INTRAVENOUS | Qty: 40 | Status: AC

## 2017-11-14 MED FILL — Mannitol IV Soln 20%: INTRAVENOUS | Qty: 500 | Status: AC

## 2017-11-14 MED FILL — Heparin Sodium (Porcine) Inj 1000 Unit/ML: INTRAMUSCULAR | Qty: 30 | Status: AC

## 2017-11-14 MED FILL — Sodium Bicarbonate IV Soln 8.4%: INTRAVENOUS | Qty: 50 | Status: AC

## 2017-11-14 MED FILL — Lidocaine HCl IV Inj 20 MG/ML: INTRAVENOUS | Qty: 5 | Status: AC

## 2017-11-14 MED FILL — Heparin Sodium (Porcine) Inj 1000 Unit/ML: INTRAMUSCULAR | Qty: 20 | Status: AC

## 2017-11-14 NOTE — Anesthesia Postprocedure Evaluation (Signed)
Anesthesia Post Note  Patient: Birder RobsonJoanna D Maese  Procedure(s) Performed: CORONARY ARTERY BYPASS GRAFTING (CABG) x Three , using left internal mammary artery and right leg greater saphenous vein harvested endoscopically (N/A Chest) TRANSESOPHAGEAL ECHOCARDIOGRAM (TEE) (N/A )     Patient location during evaluation: SICU Anesthesia Type: General Level of consciousness: awake and alert Pain management: pain level controlled Vital Signs Assessment: post-procedure vital signs reviewed and stable Respiratory status: respiratory function stable, nonlabored ventilation and spontaneous breathing Cardiovascular status: stable Postop Assessment: no apparent nausea or vomiting Anesthetic complications: no    Last Vitals:  Vitals:   11/14/17 1611 11/14/17 1700  BP:  (!) 126/59  Pulse:  72  Resp:  15  Temp: 36.8 C   SpO2:  97%    Last Pain:  Vitals:   11/14/17 1800  TempSrc:   PainSc: 0-No pain                 Ryan P Ellender

## 2017-11-14 NOTE — Care Management Note (Signed)
Case Management Note  Patient Details  Name: Birder RobsonJoanna D Hatchell MRN: [id number] Date of Birth: 1960/11/27  Subjective/Objective:   POD 1 CABG, Pt presented for Nstemi post cath [facility] 11-04-17 that revealed 3 vessel CAD. [facility]- plan for CABG 11-11-16. PT has support of daughter and she will transition to her mothers home once stable                   Action/Plan: NCM will follow for dc needs.   Expected Discharge Date:                  Expected Discharge Plan:  Home w Home Health Services  In-House Referral:     Discharge planning Services  CM Consult  Post Acute Care Choice:    Choice offered to:     DME Arranged:    DME Agency:     HH Arranged:    HH Agency:     Status of Service:  In process, will continue to follow  If discussed at Long Length of Stay Meetings, dates discussed:    Additional Comments:  Leone Havenaylor, Makahla Kiser Clinton, RN 11/14/2017, 3:36 PM

## 2017-11-14 NOTE — Anesthesia Procedure Notes (Addendum)
Central Venous Catheter Insertion Performed by: Dorris SinghGreen, Inessa Wardrop, MD, anesthesiologist Start/End1/16/2019 7:55 AM, 11/12/2017 8:10 AM Preanesthetic checklist: patient identified, IV checked, site marked, risks and benefits discussed, surgical consent, monitors and equipment checked, pre-op evaluation and timeout performed Position: Trendelenburg Lidocaine 1% used for infiltration and patient sedated Hand hygiene performed , maximum sterile barriers used  and Seldinger technique used Catheter size: 8.5 Fr PA cath was placed.Sheath introducer Swan type:thermodilution PA Cath depth:50 Procedure performed using ultrasound guided technique. Ultrasound Notes:anatomy identified Attempts: 1 Following insertion, line sutured, dressing applied and Biopatch. Post procedure assessment: blood return through all ports  Patient tolerated the procedure well with no immediate complications.

## 2017-11-14 NOTE — Discharge Summary (Signed)
Physician Discharge Summary  Patient ID: Caitlyn Herrera MRN: [id number] DOB/AGE: 03-08-1961 57 y.o.  Admit date: 11/05/2017 Discharge date: 11/17/2017  Admission Diagnoses: Non-STEMI  Discharge Diagnoses:  Principal Problem:   NSTEMI (non-ST elevated myocardial infarction) (HCC) Active Problems:   Tobacco abuse   Dyslipidemia   Non-insulin dependent type 2 diabetes mellitus (HCC)   3-vessel CAD   HCVD (hypertensive cardiovascular disease)   UTI (urinary tract infection)   Pulmonary nodule, right   S/P CABG x 3  Patient Active Problem List   Diagnosis Date Noted  . S/P CABG x 3 11/12/2017  . Non-insulin dependent type 2 diabetes mellitus (HCC) 11/10/2017  . 3-vessel CAD 11/10/2017  . HCVD (hypertensive cardiovascular disease) 11/10/2017  . UTI (urinary tract infection) 11/10/2017  . Pulmonary nodule, right 11/10/2017  . Dyslipidemia 11/07/2017  . Tobacco abuse   . NSTEMI (non-ST elevated myocardial infarction) (HCC) 11/05/2017     HISTORY OF PRESENT ILLNESS: at time of presentation to the ER  Caitlyn Herrera  is a 57 y.o. female with a known history of hyperlipidemia, hypertension presented to the emergency room with chest pain.  Pain started Sunday night while she was at work and continued until this morning..  The pain was located in the midsternal area and radiates all over the chest.  The pain radiates down both arms.  She also had some nausea and lightheadedness patient took Pepto-Bismol but did not help.  Initially the pain was 10 out of 10 but after coming to the emergency room and receiving medication the patients pain came down to 6 out of 10 on a scale of 1-10.  She was evaluated in the emergency room first set of troponin was elevated.  Patient was started on heparin drip for non-ST elevation MI.  No complaints of any shortness of breath.  Hospitalist service was consulted for further care.  The patient was admitted for further evaluation to include cardiology  consultation.    Discharged Condition: good  Hospital Course: The patient was admitted to the emergency department and ruled in for non-STEMI.  Cardiology consultation was obtained and she was admitted for further evaluation and treatment to include cardiac catheterization.  This revealed multivessel coronary artery disease and cardiothoracic surgical consultation was obtained with Lovett SoxPeter Kayle Correa MD.  Dr. Maren BeachVantrigt evaluated the patient and her studies and agree with recommendations to proceed with CABG.  On 11/12/2017 she was taken to the operating room where she underwent the below described procedure.  She tolerated the procedure well and was taken to the surgical intensive care unit in stable condition.  Postoperative hospital course:  The patient is overall progressed well.  She did initially require some inotropic support but they weaned without significant difficulty.  Were the patient does have a mild postoperative acute blood loss anemia.  He had some volume overload but is responding to diuretics and renal function is remained in the normal range.  She is undergone aggressive pulmonary toilet and oxygen is being weaned.  Incisions are healing well without evidence of infection.  All routine lines, monitors and drainage devices have been discontinued in the standard fashion.  Most recent hemoglobin and hematocrit are 8.3/25.7 Most recent BUN and creatinine are. 11/0.87. blood sugars are under adequate control and metformin has been started  She is tolerating gradually increasing activities cardiac rehab protocols.  All routine lines, monitors and drainage devices have been discontinued in the standard fashion.  At the time of discharge the patient is felt to be  quite stable.   Consults: cardiology  Significant Diagnostic Studies: angiography:  cardiac catheterization, chest CT scan-she is noted to have a 5 cm pulmonary nodule in the right lung apex.  Follow-up is recommended as she does have a  significant history of tobacco use and emphysema. Chest CT 11/07/17- IMPRESSION: 1. No acute intrathoracic process. 2. Subtle mosaic attenuation in the bilateral lower lobes and right middle lobe may reflect small airways disease. 3. 5 mm pulmonary nodule in the right lung apex. No follow-up needed if patient is low-risk. Non-contrast chest CT can be considered in 12 months if patient is high-risk. This recommendation follows the consensus statement: Guidelines for Management of Incidental Pulmonary Nodules Detected on CT Images: From the Fleischner Society 2017; Radiology 2017; 284:228-243. 4. Emphysema (ICD10-J43.9). 5. Aortic atherosclerosis (ICD10-I70.0).  Patient also had a echocardiogram and intraoperative TEE as well as preoperative Doppler studies and PFTs which revealed moderate COPD.  Her hemoglobin A1c is noted to be 6.6.  Treatments: surgery:   PATIENT:  Caitlyn Herrera  57 y.o. female  PRE-OPERATIVE DIAGNOSIS:  CAD  POST-OPERATIVE DIAGNOSIS:  CAD  PROCEDURE:  Procedure(s): CORONARY ARTERY BYPASS GRAFTING (CABG) x 3, using left internal mammary artery and right leg greater saphenous vein harvested endoscopically (N/A) TRANSESOPHAGEAL ECHOCARDIOGRAM (TEE) (N/A)  SURGEON:  Surgeon(s) and Role:    Kerin Perna, MD - Primary  PHYSICIAN ASSISTANT: WAYNE GOLD PA-C  ANESTHESIA:   general    Discharge Exam: Blood pressure 126/77, pulse 67, temperature 98.5 F (36.9 C), temperature source Oral, resp. rate 16, height 5\' 6"  (1.676 m), weight 187 lb 11.2 oz (85.1 kg), SpO2 100 %. General appearance: alert, cooperative and no distress Heart: regular rate and rhythm Lungs: clear to auscultation bilaterally Abdomen: soft, non-tender; bowel sounds normal; no masses,  no organomegaly Extremities: edema trace Wound: clean and dry     Disposition: home  Discharge Instructions    Discharge patient   Complete by:  As directed    Discharge  disposition:  01-Home or Self Care   Discharge patient date:  11/17/2017     Allergies as of 11/17/2017   No Known Allergies     Medication List    STOP taking these medications   heparin 100-0.45 UNIT/ML-% infusion   isosorbide mononitrate 30 MG 24 hr tablet Commonly known as:  IMDUR   ondansetron 4 MG disintegrating tablet Commonly known as:  ZOFRAN ODT     TAKE these medications   amiodarone 200 MG tablet Commonly known as:  PACERONE Take 1 tablet (200 mg total) by mouth daily.   aspirin 325 MG EC tablet Take 1 tablet (325 mg total) by mouth daily.   atorvastatin 80 MG tablet Commonly known as:  LIPITOR Take 1 tablet (80 mg total) by mouth daily at 6 PM.   lisinopril 10 MG tablet Commonly known as:  PRINIVIL,ZESTRIL Take 1 tablet (10 mg total) by mouth daily.   metFORMIN 500 MG tablet Commonly known as:  GLUCOPHAGE Take 1 tablet (500 mg total) by mouth 2 (two) times daily with a meal.   metoprolol tartrate 25 MG tablet Commonly known as:  LOPRESSOR Take 0.5 tablets (12.5 mg total) by mouth 2 (two) times daily.   oxyCODONE 5 MG immediate release tablet Commonly known as:  Oxy IR/ROXICODONE Take 1-2 tablets (5-10 mg total) by mouth every 6 (six) hours as needed for severe pain.      Follow-up Information    Kerin Perna, MD Follow up.  Specialty:  Cardiothoracic Surgery Why:  Appointment to see Dr. Maren Beach on 12/17/2017 at 11:30 AM.  Please obtain a chest x-ray at [facility] imaging at 11 AM.  [facility] imaging is located in the same office complex on the first floor.  Please bring your current medication list. Contact information: [address] [place] [address] [phone]        Yvonne Kendall, MD Follow up.   Specialty:  Cardiology Why:  A 2-week appointment is being made with cardiology.  Please see discharge paperwork. Contact information: [address] [place] [address] [phone]            See AVS for appts  The patient has been discharged on:   1.Beta Blocker:  Yes Cove.Etienne   ]                              No   [   ]                              If No, reason:  2.Ace Inhibitor/ARB: Yes [ y  ]                                     No  [    ]                                     If No, reason:  3.Statin:   Yes Cove.Etienne   ]                  No  [   ]                  If No, reason:  4.Ecasa:  Yes  Cove.Etienne   ]                  No   [   ]                  If No, reason:  Signed: Glenice Laine Gold 11/17/2017, 8:08 AM  patient examined and medical record reviewed,agree with above note. Kathlee Nations Trigt III 11/20/2017

## 2017-11-14 NOTE — Progress Notes (Signed)
2 Days Post-Op Procedure(s) (LRB): CORONARY ARTERY BYPASS GRAFTING (CABG) x Three , using left internal mammary artery and right leg greater saphenous vein harvested endoscopically (N/A) TRANSESOPHAGEAL ECHOCARDIOGRAM (TEE) (N/A) Subjective: CABG 3 after non-STEMI Preoperative mild renal insufficiency and hypertension Doing well postop weaned off renal dopamine with stable renal function Sinus rhythm, heart rate 68. Holding beta blocker and on low-dose amiodarone Transfer to stepdown orders in place Patient ambulating in hallway Pulmonary status stable now on room air  Objective: Vital signs in last 24 hours: Temp:  [97.8 F (36.6 C)-98.4 F (36.9 C)] 98.3 F (36.8 C) (01/18 1611) Pulse Rate:  [66-102] 72 (01/18 1700) Cardiac Rhythm: Normal sinus rhythm (01/18 0811) Resp:  [14-41] 15 (01/18 1700) BP: (102-176)/(58-102) 126/59 (01/18 1700) SpO2:  [93 %-100 %] 97 % (01/18 1700) Weight:  [197 lb 12 oz (89.7 kg)] 197 lb 12 oz (89.7 kg) (01/18 0500)  Hemodynamic parameters for last 24 hours:    Intake/Output from previous day: 01/17 0701 - 01/18 0700 In: 916.8 [P.O.:410; I.V.:406.8; IV Piggyback:100] Out: 1975 [Urine:1625; Chest Tube:350] Intake/Output this shift: Total I/O In: 285.2 [P.O.:240; I.V.:45.2] Out: 920 [Urine:900; Chest Tube:20]       Exam    General- alert and comfortable    Neck- no JVD, no cervical adenopathy palpable, no carotid bruit   Lungs- clear without rales, wheezes   Cor- regular rate and rhythm, no murmur , gallop   Abdomen- soft, non-tender   Extremities - warm, non-tender, minimal edema   Neuro- oriented, appropriate, no focal weakness   Lab Results: Recent Labs    11/13/17 1631 11/13/17 1639 11/14/17 0400  WBC 13.1*  --  16.5*  HGB 9.8* 10.5* 9.8*  HCT 30.0* 31.0* 29.2*  PLT 180  --  179   BMET:  Recent Labs    11/13/17 0354  11/13/17 1639 11/14/17 0400  NA 138  --  137 135  K 4.0  --  3.8 4.2  CL 108  --  99* 102  CO2 22  --    --  24  GLUCOSE 133*  --  149* 142*  BUN 8  --  9 10  CREATININE 0.91   < > 0.90 0.93  CALCIUM 8.6*  --   --  9.1   < > = values in this interval not displayed.    PT/INR:  Recent Labs    11/12/17 1454  LABPROT 16.9*  INR 1.39   ABG    Component Value Date/Time   PHART 7.343 (L) 11/13/2017 0537   HCO3 23.2 11/13/2017 0537   TCO2 23 11/13/2017 1639   ACIDBASEDEF 2.0 11/13/2017 0537   O2SAT 97.0 11/13/2017 0537   CBG (last 3)  Recent Labs    11/14/17 0350 11/14/17 0758 11/14/17 1148  GLUCAP 130* 148* 113*    Assessment/Plan: S/P Procedure(s) (LRB): CORONARY ARTERY BYPASS GRAFTING (CABG) x Three , using left internal mammary artery and right leg greater saphenous vein harvested endoscopically (N/A) TRANSESOPHAGEAL ECHOCARDIOGRAM (TEE) (N/A) Mobilize Diuresis Plan for transfer to step-down: see transfer orders   LOS: 9 days    Caitlyn Herrera 11/14/2017

## 2017-11-15 ENCOUNTER — Inpatient Hospital Stay (HOSPITAL_COMMUNITY): Payer: BLUE CROSS/BLUE SHIELD

## 2017-11-15 LAB — CBC
HCT: 27.3 % — ABNORMAL LOW (ref 36.0–46.0)
Hemoglobin: 9 g/dL — ABNORMAL LOW (ref 12.0–15.0)
MCH: 31.5 pg (ref 26.0–34.0)
MCHC: 33 g/dL (ref 30.0–36.0)
MCV: 95.5 fL (ref 78.0–100.0)
Platelets: 201 10*3/uL (ref 150–400)
RBC: 2.86 MIL/uL — ABNORMAL LOW (ref 3.87–5.11)
RDW: 13.2 % (ref 11.5–15.5)
WBC: 17.3 10*3/uL — ABNORMAL HIGH (ref 4.0–10.5)

## 2017-11-15 LAB — BASIC METABOLIC PANEL
Anion gap: 12 (ref 5–15)
BUN: 14 mg/dL (ref 6–20)
CO2: 24 mmol/L (ref 22–32)
Calcium: 9 mg/dL (ref 8.9–10.3)
Chloride: 100 mmol/L — ABNORMAL LOW (ref 101–111)
Creatinine, Ser: 0.92 mg/dL (ref 0.44–1.00)
GFR calc Af Amer: >60 mL/min (ref >60)
GFR calc non Af Amer: >60 mL/min (ref >60)
Glucose, Bld: 96 mg/dL (ref 65–99)
Potassium: 4 mmol/L (ref 3.5–5.1)
Sodium: 136 mmol/L (ref 135–145)

## 2017-11-15 LAB — GLUCOSE, CAPILLARY
Glucose-Capillary: 99 mg/dL (ref 65–99)
Glucose-Capillary: 88 mg/dL (ref 65–99)
Glucose-Capillary: 122 mg/dL — ABNORMAL HIGH (ref 65–99)
Glucose-Capillary: 109 mg/dL — ABNORMAL HIGH (ref 65–99)

## 2017-11-15 LAB — TYPE AND SCREEN
ABO/RH(D): A POS
Antibody Screen: NEGATIVE
Unit division: 0
Unit division: 0

## 2017-11-15 LAB — BPAM RBC
Blood Product Expiration Date: 201902042359
Blood Product Expiration Date: 201902042359
ISSUE DATE / TIME: 201901101431
Unit Type and Rh: 6200
Unit Type and Rh: 6200

## 2017-11-15 MED ORDER — HALOPERIDOL LACTATE 5 MG/ML IJ SOLN
INTRAMUSCULAR | Status: AC
  Filled 2017-11-15: qty 1

## 2017-11-15 MED ORDER — FUROSEMIDE 40 MG PO TABS
40.0000 mg | ORAL_TABLET | Freq: Every day | ORAL | Status: DC
  Administered 2017-11-15 – 2017-11-17 (×3): 40 mg via ORAL
  Filled 2017-11-15 (×3): qty 1

## 2017-11-15 NOTE — Progress Notes (Signed)
Reported called to [employer] RN on 4E. Family at bedside made aware.

## 2017-11-15 NOTE — Progress Notes (Signed)
Pt has bed on 4E19. Pt request daughter to be called. Alida called and made aware pt being moved.

## 2017-11-15 NOTE — Progress Notes (Signed)
3 Days Post-Op Procedure(s) (LRB): CORONARY ARTERY BYPASS GRAFTING (CABG) x Three , using left internal mammary artery and right leg greater saphenous vein harvested endoscopically (N/A) TRANSESOPHAGEAL ECHOCARDIOGRAM (TEE) (N/A) Subjective: Mild nausea, "a little" pain  Objective: Vital signs in last 24 hours: Temp:  [97.8 F (36.6 C)-98.8 F (37.1 C)] 98.5 F (36.9 C) (01/19 0806) Pulse Rate:  [66-74] 72 (01/19 0806) Cardiac Rhythm: Normal sinus rhythm (01/19 0400) Resp:  [14-26] 18 (01/19 0806) BP: (89-140)/(50-87) 129/87 (01/19 0806) SpO2:  [96 %-100 %] 100 % (01/19 0806) FiO2 (%):  [97 %] 97 % (01/18 2003) Weight:  [194 lb 10.7 oz (88.3 kg)] 194 lb 10.7 oz (88.3 kg) (01/19 0500)  Hemodynamic parameters for last 24 hours:    Intake/Output from previous day: 01/18 0701 - 01/19 0700 In: 435.2 [P.O.:390; I.V.:45.2] Out: 1320 [Urine:1300; Chest Tube:20] Intake/Output this shift: No intake/output data recorded.  General appearance: alert, cooperative and no distress Neurologic: intact Heart: regular rate and rhythm and + rub Lungs: diminished breath sounds bibasilar Abdomen: normal findings: soft, non-tender Wound: dressing in place  Lab Results: Recent Labs    11/14/17 0400 11/15/17 0457  WBC 16.5* 17.3*  HGB 9.8* 9.0*  HCT 29.2* 27.3*  PLT 179 201   BMET:  Recent Labs    11/14/17 0400 11/15/17 0457  NA 135 136  K 4.2 4.0  CL 102 100*  CO2 24 24  GLUCOSE 142* 96  BUN 10 14  CREATININE 0.93 0.92  CALCIUM 9.1 9.0    PT/INR:  Recent Labs    11/12/17 1454  LABPROT 16.9*  INR 1.39   ABG    Component Value Date/Time   PHART 7.343 (L) 11/13/2017 0537   HCO3 23.2 11/13/2017 0537   TCO2 23 11/13/2017 1639   ACIDBASEDEF 2.0 11/13/2017 0537   O2SAT 97.0 11/13/2017 0537   CBG (last 3)  Recent Labs    11/14/17 1148 11/14/17 1951 11/14/17 2110  GLUCAP 113* 143* 136*    Assessment/Plan: S/P Procedure(s) (LRB): CORONARY ARTERY BYPASS GRAFTING  (CABG) x Three , using left internal mammary artery and right leg greater saphenous vein harvested endoscopically (N/A) TRANSESOPHAGEAL ECHOCARDIOGRAM (TEE) (N/A) -POD # 3 CV- stable in SR  RESP- continue IS for basilar atelectasis  RENAL- creatinine and lytes OK  Change to PO lasix  ENDO- CBG well controlled  Continue cardiac rehab  Transfer to 4 E    LOS: 10 days    Loreli SlotSteven C Windsor Zirkelbach 11/15/2017

## 2017-11-15 NOTE — Plan of Care (Signed)
  Progressing Education: Knowledge of General Education information will improve 11/15/2017 0810 - Progressing by Marcelino ScotWilson, Aseel Truxillo G, RN Clinical Measurements: Will remain free from infection 11/15/2017 0810 - Progressing by Marcelino ScotWilson, Mistey Hoffert G, RN Nutrition: Adequate nutrition will be maintained 11/15/2017 0810 - Progressing by Marcelino ScotWilson, Silva Aamodt G, RN Elimination: Will not experience complications related to urinary retention 11/15/2017 0810 - Progressing by Marcelino ScotWilson, Nathan Stallworth G, RN Safety: Ability to remain free from injury will improve 11/15/2017 0810 - Progressing by Marcelino ScotWilson, Rokhaya Quinn G, RN Skin Integrity: Risk for impaired skin integrity will decrease 11/15/2017 0810 - Progressing by Marcelino ScotWilson, Dorethea Strubel G, RN Education: Ability to demonstrate proper wound care will improve 11/15/2017 0810 - Progressing by Marcelino ScotWilson, Halston Fairclough G, RN Knowledge of disease or condition will improve 11/15/2017 0810 - Progressing by Marcelino ScotWilson, Hue Frick G, RN Knowledge of the prescribed therapeutic regimen will improve 11/15/2017 0810 - Progressing by Marcelino ScotWilson, Yvonne Petite G, RN Activity: Risk for activity intolerance will decrease 11/15/2017 0810 - Progressing by Marcelino ScotWilson, Javares Kaufhold G, RN Cardiac: Hemodynamic stability will improve 11/15/2017 0810 - Progressing by Marcelino ScotWilson, Andalyn Heckstall G, RN Clinical Measurements: Postoperative complications will be avoided or minimized 11/15/2017 0810 - Progressing by Marcelino ScotWilson, Casha Estupinan G, RN Respiratory: Respiratory status will improve 11/15/2017 0810 - Progressing by Marcelino ScotWilson, Kayani Rapaport G, RN Skin Integrity: Wound healing without signs and symptoms of infection 11/15/2017 0810 - Progressing by Marcelino ScotWilson, Shamarra Warda G, RN Urinary Elimination: Ability to achieve and maintain adequate renal perfusion and functioning will improve 11/15/2017 0810 - Progressing by Marcelino ScotWilson, Nyazia Canevari G, RN

## 2017-11-15 NOTE — Progress Notes (Signed)
Called phlebotomy to let them know patient is a lab draw. Was told they would be here as soon as they could to draw morning labs.   Avari Nevares E Mylinda LatinaNino Combs, [place]RN

## 2017-11-16 LAB — BASIC METABOLIC PANEL
Anion gap: 10 (ref 5–15)
BUN: 11 mg/dL (ref 6–20)
CALCIUM: 8.8 mg/dL — AB (ref 8.9–10.3)
CO2: 26 mmol/L (ref 22–32)
Chloride: 102 mmol/L (ref 101–111)
Creatinine, Ser: 0.87 mg/dL (ref 0.44–1.00)
Glucose, Bld: 114 mg/dL — ABNORMAL HIGH (ref 65–99)
Potassium: 3.5 mmol/L (ref 3.5–5.1)
Sodium: 138 mmol/L (ref 135–145)

## 2017-11-16 LAB — CBC
HEMATOCRIT: 25.7 % — AB (ref 36.0–46.0)
Hemoglobin: 8.3 g/dL — ABNORMAL LOW (ref 12.0–15.0)
MCH: 31.1 pg (ref 26.0–34.0)
MCHC: 32.3 g/dL (ref 30.0–36.0)
MCV: 96.3 fL (ref 78.0–100.0)
PLATELETS: 254 10*3/uL (ref 150–400)
RBC: 2.67 MIL/uL — ABNORMAL LOW (ref 3.87–5.11)
RDW: 13.9 % (ref 11.5–15.5)
WBC: 13.5 10*3/uL — ABNORMAL HIGH (ref 4.0–10.5)

## 2017-11-16 LAB — GLUCOSE, CAPILLARY
Glucose-Capillary: 64 mg/dL — ABNORMAL LOW (ref 65–99)
Glucose-Capillary: 76 mg/dL (ref 65–99)

## 2017-11-16 MED ORDER — POTASSIUM CHLORIDE CRYS ER 20 MEQ PO TBCR
20.0000 meq | EXTENDED_RELEASE_TABLET | Freq: Every day | ORAL | Status: DC
  Administered 2017-11-16 – 2017-11-17 (×2): 20 meq via ORAL
  Filled 2017-11-16 (×2): qty 1

## 2017-11-16 MED ORDER — METOPROLOL TARTRATE 12.5 MG HALF TABLET
12.5000 mg | ORAL_TABLET | Freq: Two times a day (BID) | ORAL | Status: DC
  Administered 2017-11-16 – 2017-11-17 (×3): 12.5 mg via ORAL
  Filled 2017-11-16 (×3): qty 1

## 2017-11-16 MED ORDER — METFORMIN HCL 500 MG PO TABS
500.0000 mg | ORAL_TABLET | Freq: Two times a day (BID) | ORAL | Status: DC
  Administered 2017-11-16 – 2017-11-17 (×3): 500 mg via ORAL
  Filled 2017-11-16 (×3): qty 1

## 2017-11-16 NOTE — Op Note (Signed)
NAME:  Caitlyn LedererDICKERSON, Caitlyn            ACCOUNT NO.:  [id number]  MEDICAL RECORD NO.:  [id number]  LOCATION:  [id number]                        FACILITY:  [facility]  PHYSICIAN:  Kerin PernaPeter Van Trigt, M.D.  DATE OF BIRTH:  07/25/1961  DATE OF PROCEDURE:  11/12/2017 DATE OF DISCHARGE:                              OPERATIVE REPORT   OPERATIONS: 1. Coronary artery bypass grafting x3 (left internal mammary artery to     left anterior descending, saphenous vein graft to obtuse marginal     branch of the circumflex, saphenous vein graft to posterior     descending branch of the right coronary artery). 2. Endoscopic harvest of right leg greater saphenous vein.  PREOPERATIVE DIAGNOSES:  Non-ST-elevation myocardial infarction, severe 3-vessel coronary artery disease, unstable angina.  POSTOPERATIVE DIAGNOSES:  Non-ST-elevation myocardial infarction, severe 3-vessel coronary artery disease, unstable angina.  SURGEON:  Kerin PernaPeter Van Trigt, MD.  ASSISTANT:  Rowe ClackWayne E. Gold, PA-C.  ANESTHESIA:  General.  INDICATIONS:  The patient is a 57 year old obese female, smoker, who developed progressive chest pain of increased frequency and intensity and was admitted to an outside hospital where cardiac enzymes were elevated.  She was diagnosed with non-ST elevation MI and underwent cardiac catheterization at [facility].  This demonstrated severe multivessel coronary artery disease with high-grade LAD stenosis and TIMI 1 flow.  Overall LVEF was mildly reduced.  An echocardiogram was subsequently performed, which showed no significant valvular disease.  Because of her severe 3-vessel coronary artery disease and small vessel, she was felt to be a candidate for surgical coronary revascularization rather than percutaneous intervention.  I examined the patient after [facility] and discussed with her the coronary artery disease after reviewing the images of her cardiac catheterization.   I discussed the procedure of CABG for treatment of her CAD including the location of the surgical incision, the use of general anesthesia, the use of cardiopulmonary bypass, and the expected postoperative hospital recovery.  I discussed with the patient the risks to her of CABG for treatment of her CAD including risks of stroke, bleeding, blood transfusion requirement, postoperative pulmonary problems including pleural effusion, postop bradyarrhythmia, postoperative infection, postoperative organ failure, and death.  After reviewing these issues, she demonstrated her understanding and agreed to proceed with surgery under what I felt was an informed consent.  FINDINGS: 1. Adequate conduit, although the mammary artery was small, 1.2 mm     diameter. 2. Small coronary vessels, which would be difficult for redo graft. 3. No blood products required for the surgery.  DESCRIPTION OF PROCEDURE:  The patient was brought from the preoperative holding area to the OR and placed supine on the operating table. General anesthesia was induced and the patient remained stable.  The chest, abdomen, and legs were prepped with Betadine and the patient was draped as a sterile field.  A proper time-out was performed.  A sternal incision was made and saphenous vein was harvested endoscopically from the right leg.  The left internal mammary artery was harvested as a pedicle graft from its origin at the subclavian vessels.  There was a small vessel, but had satisfactory flow.  The sternal retractor was placed using the deep  blades because of the patient's obese body habitus.  The pericardium was opened and suspended.  Pursestrings were placed in the ascending aorta and right atrium.  Heparin was administered and ACT was documented as being therapeutic.  The patient was cannulated and placed on cardiopulmonary bypass.  The coronaries were identified for grafting and the mammary artery and vein grafts were  prepared for the distal anastomosis.  Cardioplegia cannulas were placed both antegrade aortic and retrograde coronary sinus cardioplegia.  The patient was cooled to 32 degrees and aortic crossclamp was applied.  One liter of cold blood cardioplegia was delivered in split doses between the antegrade aortic and retrograde coronary sinus catheters.  There was good cardioplegic arrest and septal temperature dropped less than 14 degrees.  Cardioplegia was delivered every 20 minutes and topical ice saline was used for myocardial cooling.  The distal coronary anastomoses were performed.  The first distal anastomosis was to the posterior descending branch of right coronary artery.  There was a proximal 95% stenosis.  The vessel was 1.2 mm in diameter.  Reversed saphenous vein was sewn end-to-side with running 8-0 Prolene and there was good flow through the graft.  Cardioplegia was redosed.  The second distal anastomosis was to the circumflex marginal of the left coronary artery.  This had heavy disease with a proximal 90% stenosis. A reversed saphenous vein was sewn end-to-side with running 7-0 Prolene and there was good flow through the graft.  Cardioplegia was redosed.  Third distal anastomosis was to the mid LAD.  More distally, it was very small and less than 1 mm.  At the site of the anastomosis, vessel had a diameter of 1.4 mm.  The mammary artery pedicle was brought through an opening in the left lateral pericardium and was brought down onto the LAD and sewn end-to-side with a running 8-0 Prolene.  There was good flow through the anastomosis after briefly releasing the pedicle bulldog on the mammary artery.  The bulldog was reapplied and the pedicle was secured to the epicardium with 6-0 Prolenes.  Cardioplegia was redosed.  While the crossclamp was still in place, 2 proximal vein anastomoses were performed on the ascending aorta using a 4.5-mm punch running 6-0 Prolene.  Prior to  tying down the final proximal anastomosis, air was vented from the coronaries with a dose of retrograde warm blood cardioplegia-hotshot.  The crossclamp was removed.  The heart resumed a spontaneous rhythm.  The vein grafts were de-aired and opened.  Each had good flow and hemostasis was documented at the proximal and distal anastomoses.  The patient was rewarmed and reperfused.  Temporary pacing wires were applied.  The lungs were expanded and the ventilator was resumed.  The patient was then weaned off cardiopulmonary bypass without difficulty.  LV function was dynamic on echo.  Hemodynamics were stable.  Protamine was administered without adverse reaction.  The cannulas were removed.  The mediastinum was irrigated with warm saline.  The superior pericardial fat was closed over the aorta and vein grafts.  Anterior mediastinal and left pleural chest tubes were placed and brought out through separate incisions.  The sternum was closed with interrupted steel wire.  The pectoralis fascia was closed with a running #1 Vicryl.  The subcutaneous and skin layers were closed with running Vicryl and sterile dressings were applied.  Total cardiopulmonary bypass time was 120 minutes.     Kerin Perna, M.D.     PV/MEDQ  D:  11/15/2017  T:  11/16/2017  Job:  [id number]  cc:   Yvonne Kendall, M.D.

## 2017-11-16 NOTE — Progress Notes (Addendum)
      [address]       [place] [id number]             [phone]      4 Days Post-Op Procedure(s) (LRB): CORONARY ARTERY BYPASS GRAFTING (CABG) x Three , using left internal mammary artery and right leg greater saphenous vein harvested endoscopically (N/A) TRANSESOPHAGEAL ECHOCARDIOGRAM (TEE) (N/A)   Subjective:  Caitlyn Herrera has no specific complaints.  She states she is feeling okay, just having trouble getting comfortable in the bed.  She denies further nausea.   Objective: Vital signs in last 24 hours: Temp:  [98.4 F (36.9 C)-99.5 F (37.5 C)] 99.5 F (37.5 C) (01/20 0406) Pulse Rate:  [69-90] 90 (01/20 0406) Cardiac Rhythm: Normal sinus rhythm (01/19 1900) Resp:  [13-28] 28 (01/20 0411) BP: (112-145)/(66-80) 138/78 (01/20 0406) SpO2:  [95 %-100 %] 95 % (01/20 0808) Weight:  [192 lb 8 oz (87.3 kg)] 192 lb 8 oz (87.3 kg) (01/20 0411)  Intake/Output from previous day: 01/19 0701 - 01/20 0700 In: 443 [P.O.:440; I.V.:3] Out: -   General appearance: alert, cooperative and no distress Heart: regular rate and rhythm Lungs: clear to auscultation bilaterally Abdomen: soft, non-tender; bowel sounds normal; no masses,  no organomegaly Extremities: edema trace Wound: clean and dry  Lab Results: Recent Labs    11/15/17 0457 11/16/17 0331  WBC 17.3* 13.5*  HGB 9.0* 8.3*  HCT 27.3* 25.7*  PLT 201 254   BMET:  Recent Labs    11/15/17 0457 11/16/17 0331  NA 136 138  K 4.0 3.5  CL 100* 102  CO2 24 26  GLUCOSE 96 114*  BUN 14 11  CREATININE 0.92 0.87  CALCIUM 9.0 8.8*    PT/INR: No results for input(s): LABPROT, INR in the last 72 hours. ABG    Component Value Date/Time   PHART 7.343 (L) 11/13/2017 0537   HCO3 23.2 11/13/2017 0537   TCO2 23 11/13/2017 1639   ACIDBASEDEF 2.0 11/13/2017 0537   O2SAT 97.0 11/13/2017 0537   CBG (last 3)  Recent Labs    11/15/17 1151 11/15/17 1554 11/15/17 2118  GLUCAP 88 122* 109*     Assessment/Plan: S/P Procedure(s) (LRB): CORONARY ARTERY BYPASS GRAFTING (CABG) x Three , using left internal mammary artery and right leg greater saphenous vein harvested endoscopically (N/A) TRANSESOPHAGEAL ECHOCARDIOGRAM (TEE) (N/A)  1. CV- NSR, mild HTN- continue Amiodarone 200 mg daily, will restart low dose Lopressor for better BP control  2. Pulm- no acute issues, off oxygen, continue IS 3. Renal- creatinine WNL, weight is trending down, continue Lasix, will start potassium supplementation, K is at 3.5 4. Expected blood loss anemia, moderate Hgb at 8.3, monitor 5. DM- A1c is 6.8, currently on Levemir, will d/c and start low dose Metformin 6. Dispo- patient stable, start BB for better BP control, d/c EPW, continue diuresis, possibly ready for d/c in next 24-48 hours  LOS: 11 days    Caitlyn Dandyrin Barrett 11/16/2017 Patient seen and examined, agree with above   Viviann SpareSteven C. Dorris FetchHendrickson, MD [facility] [phone]

## 2017-11-17 LAB — GLUCOSE, CAPILLARY
Glucose-Capillary: 128 mg/dL — ABNORMAL HIGH (ref 65–99)
Glucose-Capillary: 85 mg/dL (ref 65–99)
Glucose-Capillary: 77 mg/dL (ref 65–99)
Glucose-Capillary: 89 mg/dL (ref 65–99)
Glucose-Capillary: 91 mg/dL (ref 65–99)
Glucose-Capillary: 90 mg/dL (ref 65–99)
Glucose-Capillary: 91 mg/dL (ref 65–99)

## 2017-11-17 MED ORDER — METFORMIN HCL 500 MG PO TABS: 500.0000 mg | ORAL_TABLET | Freq: Two times a day (BID) | ORAL | 1 refills | Status: DC

## 2017-11-17 MED ORDER — LISINOPRIL 10 MG PO TABS: 10.0000 mg | ORAL_TABLET | Freq: Every day | ORAL | 1 refills | Status: DC

## 2017-11-17 MED ORDER — LISINOPRIL 10 MG PO TABS
10.0000 mg | ORAL_TABLET | Freq: Every day | ORAL | Status: DC
  Administered 2017-11-17: 10 mg via ORAL
  Filled 2017-11-17: qty 1

## 2017-11-17 MED ORDER — OXYCODONE HCL 5 MG PO TABS: 5.0000 mg | ORAL_TABLET | Freq: Four times a day (QID) | ORAL | 0 refills | Status: DC | PRN

## 2017-11-17 MED ORDER — LEVALBUTEROL HCL 1.25 MG/0.5ML IN NEBU: 1.2500 mg | INHALATION_SOLUTION | Freq: Four times a day (QID) | RESPIRATORY_TRACT | Status: DC | PRN

## 2017-11-17 MED ORDER — METOPROLOL TARTRATE 25 MG PO TABS: 12.5000 mg | ORAL_TABLET | Freq: Two times a day (BID) | ORAL | 1 refills | Status: DC

## 2017-11-17 MED ORDER — AMIODARONE HCL 200 MG PO TABS: 200.0000 mg | ORAL_TABLET | Freq: Every day | ORAL | 1 refills | Status: DC

## 2017-11-17 MED ORDER — ASPIRIN 325 MG PO TBEC: 325.0000 mg | DELAYED_RELEASE_TABLET | Freq: Every day | ORAL | Status: DC

## 2017-11-17 NOTE — Progress Notes (Signed)
[id number] Pt has been walking without rolling walker so did not walk with pt. Discussed sternal precautions, IS, ex ed and gave heart healthy and diabetic diets. Discussed carb counting. Pt was asking about signs of low and high blood sugar. She has the diabetic booklet. Found the page and reviewed with pt the information. Encouraged smoking cessation. Pt stated she is going to use nicotine patches. Encouraged her to call 1800quitnow. Discussed CRP 2 and will refer to [facility]. Fixed breakfast up for pt but still not up to eating at this time. Covered up her breakfast and encouraged her to eat later. Luetta NuttingCharlene Lateshia Schmoker RN BSN 11/17/2017 9:12 AM

## 2017-11-17 NOTE — Care Management Note (Signed)
Case Management Note Previous CM note completed by Leone Havenaylor, Deborah Clinton, RN--11/14/2017, 3:36 PM   Patient Details  Name: Caitlyn Herrera MRN: [id number] Date of Birth: [birth year]  Subjective/Objective:   POD 1 CABG, Pt presented for Nstemi post cath at [facility] 11-04-17 that revealed 3 vessel CAD. [facility]17- plan for CABG 11-11-16. PT has support of daughter and she will transition to her mothers home once stable              Action/Plan: NCM will follow for dc needs.   Expected Discharge Date:  11/17/17               Expected Discharge Plan:  Home w Home Health Services  In-House Referral:  NA  Discharge planning Services  CM Consult  Post Acute Care Choice:  NA Choice offered to:  NA  DME Arranged:    DME Agency:     HH Arranged:    HH Agency:     Status of Service:  Completed, signed off  If discussed at [employer] Length of Stay Meetings, dates discussed:    Discharge Disposition: home/self care  Additional Comments:  11/17/17- 1050- [name] RN, CM- pt for d/c home today- no CM needs noted for transition home. Pt has been walking without rolling walker per cardiac rehab.   [name], GoesselKristi Hall, RN 11/17/2017, 10:50 AM [phone] 4E Case manager

## 2017-11-17 NOTE — Op Note (Signed)
NAME:  Cruzita LedererDICKERSON, Alisandra            ACCOUNT NO.:  [id number]  MEDICAL RECORD NO.:  [id number]  LOCATION:  ED14A                        FACILITY:  [facility]  PHYSICIAN:  Kerin PernaPeter Van Trigt, M.D.  DATE OF BIRTH:  February 04, 1961  DATE OF PROCEDURE:  11/12/2017 DATE OF DISCHARGE:                              OPERATIVE REPORT   OPERATIONS: 1. Coronary artery bypass grafting x3 (left internal mammary artery to     left anterior descending, saphenous vein graft to posterior     descending, saphenous vein graft to circumflex marginal). 2. Endoscopic harvest of right leg greater saphenous vein.  SURGEON:  Kerin PernaPeter Van Trigt, MD.  ASSISTANT:  Rowe ClackWayne E. Gold, PA-C.  PREOPERATIVE DIAGNOSES:  Non-ST-elevation myocardial infarction, unstable angina, severe 3-vessel coronary artery disease.  POSTOPERATIVE DIAGNOSES:  Non-ST-elevation myocardial infarction, unstable angina, severe 3-vessel coronary artery disease.  INDICATIONS:  The patient is a 57 year old obese female, who presented with chest pain and positive cardiac enzymes to outside hospital.  She underwent cardiac catheterization by Dr. Okey DupreEnd, which demonstrated severe 3-vessel coronary artery disease.  She required IV heparin and nitroglycerin to control her unstable angina and she was transferred to this hospital.  I evaluated the patient for coronary artery bypass grafting and felt that this would be her best long-term therapy for treatment of her severe 3-vessel coronary artery disease.  I had discussed the procedure in detail with the patient as well as her family on more than one occasion prior to surgery.  We discussed the location of the surgical incisions, the use of general anesthesia and cardiopulmonary bypass, and the expected postoperative hospital recovery.  I discussed with the patient risks to her of the CABG operation including risks of stroke, bleeding, blood transfusion requirement, postoperative pulmonary problems including  pleural effusion, postoperative infection, postoperative organ failure, and death.  She demonstrated her understanding of these issues and agreed to proceed with surgery under what I felt was an informed consent.  OPERATIVE FINDINGS: 1. Small but graftable coronary targets. 2. Adequate conduit, mammary artery small but with good flow.  OPERATIVE PROCEDURE:  The patient was brought to the operating room and placed supine on the operating table.  General anesthesia was induced under invasive hemodynamic monitoring.  The chest, abdomen, and legs were prepped with Betadine and draped as a sterile field.  A transesophageal echo probe was placed by the Anesthesia team.  A proper time-out was performed.  A sternal incision was made as the saphenous vein was harvested endoscopically from the right leg.  The left internal mammary artery was harvested as a pedicle graft from its origin at the subclavian vessels.  The sternal retractor was placed using the deep blades because of the patient's obese body habitus.  The pericardium was opened and suspended.  Pursestrings were placed in the ascending aorta and right atrium and heparin was administered.  When the ACT was documented as being therapeutic, the patient was cannulated and placed on cardiopulmonary bypass.  She remained stable.  The coronaries were identified for grafting.  The mammary artery and vein grafts were prepared for the distal anastomoses.  Cardioplegia cannulas were placed both antegrade aortic and retrograde coronary sinus catheter cardioplegia.  The patient was  cooled to 32 degrees and aortic crossclamp was applied.  One liter of cold blood cardioplegia was delivered in split doses between the antegrade aortic and retrograde coronary sinus catheters. There was good cardioplegic arrest and septal temperature dropped less than 14 degrees.  Cardioplegia was delivered every 20 minutes.  The distal coronary anastomoses were  performed.  The first distal anastomosis was to the posterior descending.  This was a 1.2-mm vessel with proximal 90% stenosis.  A reversed saphenous vein was sewn end-to- side with running 7-0 Prolene good flow through the graft.  Cardioplegia was redosed.  The second distal anastomosis was to the OM branch of the left circumflex.  This was a 1.5-mm vessel with proximal 90% stenosis.  A reversed saphenous vein was sewn end-to-side with running 7-0 Prolene with good flow through the graft.  Cardioplegia was redosed.  The third distal anastomosis was to the mid LAD, which was a 1.5-mm vessel.  The left IMA pedicle was brought through an opening in the left lateral pericardium and it was brought down onto the LAD and sewn end-to- side with running 8-0 Prolene.  There was good flow through the anastomosis after briefly releasing the pedicle bulldog on the mammary artery.  The bulldog was reapplied and the pedicle was secured to the epicardium with 6-0 Prolene.  Cardioplegia was redosed.  While the crossclamp was still in place, 2 proximal vein anastomoses were performed using a 4.5-mm punch and running 6-0 Prolene.  Prior to tying down the final proximal anastomosis, air was vented from the coronaries with a dose of retrograde warm blood cardioplegia.  The crossclamp was removed.  The heart resumed a spontaneous rhythm.  The vein grafts were de-aired and opened.  Each had good flow and hemostasis was documented at the proximal and distal sites.  The patient was rewarmed and reperfused. Temporary pacing wires were applied.  The lungs were expanded and ventilator was resumed.  The patient was then weaned off cardiopulmonary bypass without difficulty.  Minimal inotropic support was required. Echo showed preserved normal LV function.  Protamine was administered without adverse reaction.  The cannulas were removed.  The mediastinum was irrigated.  The superior pericardial fat was closed over  the aorta. Anterior mediastinal and left pleural chest tubes were placed and brought out through separate incisions.  The sternum was closed over the wire.  The patient remained stable.  The pectoralis fascia was closed with a running #1 Vicryl.  The subcutaneous and skin layers were closed with running Vicryl and sterile dressings were applied.  Total cardiopulmonary bypass time was 118 minutes.    Kerin Perna, M.D.    PV/MEDQ  D:  11/17/2017  T:  11/17/2017  Job:  [id number]  cc:   Yvonne Kendall, M.D.

## 2017-11-17 NOTE — Progress Notes (Signed)
Birder RobsonJoanna D Inzunza to be D/C'd Home per MD order. Discussed with the patient and all questions fully answered.    VVS, Skin clean, dry and intact without evidence of skin break down, no evidence of skin tears noted.  IV catheter discontinued intact. Site without signs and symptoms of complications. Dressing and pressure applied.  An After Visit Summary was printed and given to the patient.  Patient escorted via WC, and D/C home via private auto.  Kai LevinsJacobs, Elizabethanne Lusher N  11/17/2017 6:57 PM

## 2017-11-17 NOTE — Discharge Instructions (Signed)
Blood Glucose Monitoring, Adult Monitoring your blood sugar (glucose) helps you manage your diabetes. It also helps you and your health care provider determine how well your diabetes management plan is working. Blood glucose monitoring involves checking your blood glucose as often as directed, and keeping a record (log) of your results over time. Why should I monitor my blood glucose? Checking your blood glucose regularly can:  Help you understand how food, exercise, illnesses, and medicines affect your blood glucose.  Let you know what your blood glucose is at any time. You can quickly tell if you are having low blood glucose (hypoglycemia) or high blood glucose (hyperglycemia).  Help you and your health care provider adjust your medicines as needed.  When should I check my blood glucose? Follow instructions from your health care provider about how often to check your blood glucose. This may depend on:  The type of diabetes you have.  How well-controlled your diabetes is.  Medicines you are taking.  If you have type 1 diabetes:  Check your blood glucose at least 2 times a day.  Also check your blood glucose: ? Before every insulin injection. ? Before and after exercise. ? Between meals. ? 2 hours after a meal. ? Occasionally between 2:00 a.m. and 3:00 a.m., as directed. ? Before potentially dangerous tasks, like driving or using heavy machinery. ? At bedtime.  You may need to check your blood glucose more often, up to 6-10 times a day: ? If you use an insulin pump. ? If you need multiple daily injections (MDI). ? If your diabetes is not well-controlled. ? If you are ill. ? If you have a history of severe hypoglycemia. ? If you have a history of not knowing when your blood glucose is getting low (hypoglycemia unawareness). If you have type 2 diabetes:  If you take insulin or other diabetes medicines, check your blood glucose at least 2 times a day.  If you are on intensive  insulin therapy, check your blood glucose at least 4 times a day. Occasionally, you may also need to check between 2:00 a.m. and 3:00 a.m., as directed.  Also check your blood glucose: ? Before and after exercise. ? Before potentially dangerous tasks, like driving or using heavy machinery.  You may need to check your blood glucose more often if: ? Your medicine is being adjusted. ? Your diabetes is not well-controlled. ? You are ill. What is a blood glucose log?  A blood glucose log is a record of your blood glucose readings. It helps you and your health care provider: ? Look for patterns in your blood glucose over time. ? Adjust your diabetes management plan as needed.  Every time you check your blood glucose, write down your result and notes about things that may be affecting your blood glucose, such as your diet and exercise for the day.  Most glucose meters store a record of glucose readings in the meter. Some meters allow you to download your records to a computer. How do I check my blood glucose? Follow these steps to get accurate readings of your blood glucose: Supplies needed   Blood glucose meter.  Test strips for your meter. Each meter has its own strips. You must use the strips that come with your meter.  A needle to prick your finger (lancet). Do not use lancets more than once.  A device that holds the lancet (lancing device).  A journal or log book to write down your results. Procedure  Wash your hands with soap and water.  Prick the side of your finger (not the tip) with the lancet. Use a different finger each time.  Gently rub the finger until a small drop of blood appears.  Follow instructions that come with your meter for inserting the test strip, applying blood to the strip, and using your blood glucose meter.  Write down your result and any notes. Alternative testing sites  Some meters allow you to use areas of your body other than your finger  (alternative sites) to test your blood.  If you think you may have hypoglycemia, or if you have hypoglycemia unawareness, do not use alternative sites. Use your finger instead.  Alternative sites may not be as accurate as the fingers, because blood flow is slower in these areas. This means that the result you get may be delayed, and it may be different from the result that you would get from your finger.  The most common alternative sites are: ? Forearm. ? Thigh. ? Palm of the hand. Additional tips  Always keep your supplies with you.  If you have questions or need help, all blood glucose meters have a 24-hour hotline number that you can call. You may also contact your health care provider.  After you use a few boxes of test strips, adjust (calibrate) your blood glucose meter by following instructions that came with your meter. This information is not intended to replace advice given to you by your health care provider. Make sure you discuss any questions you have with your health care provider. Document Released: 10/17/2003 Document Revised: 05/03/2016 Document Reviewed: 03/25/2016 Elsevier Interactive Patient Education  2017 [place]. Metformin extended-release tablets What is this medicine? METFORMIN (met FOR min) is used to treat type 2 diabetes. It helps to control blood sugar. Treatment is combined with diet and exercise. This medicine can be used alone or with other medicines for diabetes. This medicine may be used for other purposes; ask your health care provider or pharmacist if you have questions. COMMON BRAND NAME(S): Fortamet, Glucophage XR, Glumetza What should I tell my health care provider before I take this medicine? They need to know if you have any of these conditions: -anemia -dehydration -heart disease -frequently drink alcohol-containing beverages -kidney disease -liver disease -polycystic ovary syndrome -serious infection or injury -vomiting -an  unusual or allergic reaction to metformin, other medicines, foods, dyes, or preservatives -pregnant or trying to get pregnant -breast-feeding How should I use this medicine? Take this medicine by mouth with a glass of water. Follow the directions on the prescription label. Take this medicine with food. Take your medicine at regular intervals. Do not take your medicine more often than directed. Do not stop taking except on your doctor's advice. Talk to your pediatrician regarding the use of this medicine in children. Special care may be needed. Overdosage: If you think you have taken too much of this medicine contact a poison control center or emergency room at once. NOTE: This medicine is only for you. Do not share this medicine with others. What if I miss a dose? If you miss a dose, take it as soon as you can. If it is almost time for your next dose, take only that dose. Do not take double or extra doses. What may interact with this medicine? Do not take this medicine with any of the following medications: -dofetilide -certain contrast medicines given before X-rays, CT scans, MRI, or other procedures This medicine may also interact with the  following medications: -acetazolamide -certain antiviral medicines for HIV or AIDS or for hepatitis, like adefovir, dolutegravir, emtricitabine, entecavir, lamivudine, paritaprevir, or tenofovir -cimetidine -cobicistat -crizotinib -dichlorphenamide -digoxin -diuretics -female hormones, like estrogens or progestins and birth control pills -glycopyrrolate -isoniazid -lamotrigine -medicines for blood pressure, heart disease, irregular heart beat -memantine -midodrine -methazolamide -morphine -niacin -phenothiazines like chlorpromazine, mesoridazine, prochlorperazine, thioridazine -phenytoin -procainamide -propantheline -quinidine -quinine -ranitidine -ranolazine -steroid medicines like prednisone or cortisone -stimulant medicines for  attention disorders, weight loss, or to stay awake -thyroid medicines -topiramate -trimethoprim -trospium -vancomycin -vandetanib -zonisamide This list may not describe all possible interactions. Give your health care provider a list of all the medicines, herbs, non-prescription drugs, or dietary supplements you use. Also tell them if you smoke, drink alcohol, or use illegal drugs. Some items may interact with your medicine. What should I watch for while using this medicine? Visit your doctor or health care professional for regular checks on your progress. A test called the HbA1C (A1C) will be monitored. This is a simple blood test. It measures your blood sugar control over the last 2 to 3 months. You will receive this test every 3 to 6 months. Learn how to check your blood sugar. Learn the symptoms of low and high blood sugar and how to manage them. Always carry a quick-source of sugar with you in case you have symptoms of low blood sugar. Examples include hard sugar candy or glucose tablets. Make sure others know that you can choke if you eat or drink when you develop serious symptoms of low blood sugar, such as seizures or unconsciousness. They must get medical help at once. Tell your doctor or health care professional if you have high blood sugar. You might need to change the dose of your medicine. If you are sick or exercising more than usual, you might need to change the dose of your medicine. Do not skip meals. Ask your doctor or health care professional if you should avoid alcohol. Many nonprescription cough and cold products contain sugar or alcohol. These can affect blood sugar. This medicine may cause ovulation in premenopausal women who do not have regular monthly periods. This may increase your chances of becoming pregnant. You should not take this medicine if you become pregnant or think you may be pregnant. Talk with your doctor or health care professional about your birth control  options while taking this medicine. Contact your doctor or health care professional right away if think you are pregnant. The tablet shell for some brands of this medicine does not dissolve. This is normal. The tablet shell may appear whole in the stool. This is not a cause for concern. If you are going to need surgery, a MRI, CT scan, or other procedure, tell your doctor that you are taking this medicine. You may need to stop taking this medicine before the procedure. Wear a medical ID bracelet or chain, and carry a card that describes your disease and details of your medicine and dosage times. What side effects may I notice from receiving this medicine? Side effects that you should report to your doctor or health care professional as soon as possible: -allergic reactions like skin rash, itching or hives, swelling of the face, lips, or tongue -breathing problems -feeling faint or lightheaded, falls -muscle aches or pains -signs and symptoms of low blood sugar such as feeling anxious, confusion, dizziness, increased hunger, unusually weak or tired, sweating, shakiness, cold, irritable, headache, blurred vision, fast heartbeat, loss of consciousness -slow or irregular heartbeat -  unusual stomach pain or discomfort -unusually tired or weak Side effects that usually do not require medical attention (report to your doctor or health care professional if they continue or are bothersome): -diarrhea -headache -heartburn -metallic taste in mouth -nausea -stomach gas, upset This list may not describe all possible side effects. Call your doctor for medical advice about side effects. You may report side effects to FDA at 1-800-FDA-1088. Where should I keep my medicine? Keep out of the reach of children. Store at room temperature between 15 and 30 degrees C (59 and 86 degrees F). Protect from light. Throw away any unused medicine after the expiration date. NOTE: This sheet is a summary. It may not cover  all possible information. If you have questions about this medicine, talk to your doctor, pharmacist, or health care provider.  2018 Elsevier[employer] Standard (2016-04-24 15:47:35) Endoscopic Saphenous Vein Harvesting, Care After Refer to this sheet in the next few weeks. These instructions provide you with information about caring for yourself after your procedure. Your health care provider may also give you more specific instructions. Your treatment has been planned according to current medical practices, but problems sometimes occur. Call your health care provider if you have any problems or questions after your procedure. What can I expect after the procedure? After the procedure, it is common to have:  Pain.  Bruising.  Swelling.  Numbness.  Follow these instructions at home: Medicine  Take over-the-counter and prescription medicines only as told by your health care provider.  Do not drive or operate heavy machinery while taking prescription pain medicine. Incision care   Follow instructions from your health care provider about how to take care of the cut made during surgery (incision). Make sure you: ? Wash your hands with soap and water before you change your bandage (dressing). If soap and water are not available, use hand sanitizer. ? Change your dressing as told by your health care provider. ? Leave stitches (sutures), skin glue, or adhesive strips in place. These skin closures may need to be in place for 2 weeks or longer. If adhesive strip edges start to loosen and curl up, you may trim the loose edges. Do not remove adhesive strips completely unless your health care provider tells you to do that.  Check your incision area every day for signs of infection. Check for: ? More redness, swelling, or pain. ? More fluid or blood. ? Warmth. ? Pus or a bad smell. General instructions  Raise (elevate) your legs above the level of your heart while you are sitting or lying  down.  Do any exercises your health care providers have given you. These may include deep breathing, coughing, and walking exercises.  Do not shower, take baths, swim, or use a hot tub unless told by your health care provider.  Wear your elastic stocking if told by your health care provider.  Keep all follow-up visits as told by your health care provider. This is important. Contact a health care provider if:  Medicine does not help your pain.  Your pain gets worse.  You have new leg bruises or your leg bruises get bigger.  You have a fever.  Your leg feels numb.  You have more redness, swelling, or pain around your incision.  You have more fluid or blood coming from your incision.  Your incision feels warm to the touch.  You have pus or a bad smell coming from your incision. Get help right away if:  Your pain is severe.  You develop pain, tenderness, warmth, redness, or swelling in any part of your leg.  You have chest pain.  You have trouble breathing. This information is not intended to replace advice given to you by your health care provider. Make sure you discuss any questions you have with your health care provider. Document Released: 06/26/2011 Document Revised: 03/21/2016 Document Reviewed: 08/28/2015 Elsevier Interactive Patient Education  2018 [place]. Coronary Artery Bypass Grafting, Care After These instructions give you information on caring for yourself after your procedure. Your doctor may also give you more specific instructions. Call your doctor if you have any problems or questions after your procedure. Follow these instructions at home:  Only take medicine as told by your doctor. Take medicines exactly as told. Do not stop taking medicines or start any new medicines without talking to your doctor first.  Take your pulse as told by your doctor.  Do deep breathing as told by your doctor. Use your breathing device (incentive spirometer), if given,  to practice deep breathing several times a day. Support your chest with a pillow or your arms when you take deep breaths or cough.  Keep the area clean, dry, and protected where the surgery cuts (incisions) were made. Remove bandages (dressings) only as told by your doctor. If strips were applied to surgical area, do not take them off. They fall off on their own.  Check the surgery area daily for puffiness (swelling), redness, or leaking fluid.  If surgery cuts were made in your legs: ? Avoid crossing your legs. ? Avoid sitting for long periods of time. Change positions every 30 minutes. ? Raise your legs when you are sitting. Place them on pillows.  Wear stockings that help keep blood clots from forming in your legs (compression stockings).  Only take sponge baths until your doctor says it is okay to take showers. Pat the surgery area dry. Do not rub the surgery area with a washcloth or towel. Do not bathe, swim, or use a hot tub until your doctor says it is okay.  Eat foods that are high in fiber. These include raw fruits and vegetables, whole grains, beans, and nuts. Choose lean meats. Avoid canned, processed, and fried foods.  Drink enough fluids to keep your pee (urine) clear or pale yellow.  Weigh yourself every day.  Rest and limit activity as told by your doctor. You may be told to: ? Stop any activity if you have chest pain, shortness of breath, changes in heartbeat, or dizziness. Get help right away if this happens. ? Move around often for short amounts of time or take short walks as told by your doctor. Gradually become more active. You may need help to strengthen your muscles and build endurance. ? Avoid lifting, pushing, or pulling anything heavier than 10 pounds (4.5 kg) for at least 6 weeks after surgery.  Do not drive until your doctor says it is okay.  Ask your doctor when you can go back to work.  Ask your doctor when you can begin sexual activity again.  Follow up  with your doctor as told. Contact a doctor if:  You have puffiness, redness, more pain, or fluid draining from the incision site.  You have a fever.  You have puffiness in your ankles or legs.  You have pain in your legs.  You gain 2 or more pounds (0.9 kg) a day.  You feel sick to your stomach (nauseous) or throw up (vomit).  You have watery poop (diarrhea).  Get help right away if:  You have chest pain that goes to your jaw or arms.  You have shortness of breath.  You have a fast or irregular heartbeat.  You notice a "clicking" in your breastbone when you move.  You have numbness or weakness in your arms or legs.  You feel dizzy or light-headed. This information is not intended to replace advice given to you by your health care provider. Make sure you discuss any questions you have with your health care provider. Document Released: 10/19/2013 Document Revised: 03/21/2016 Document Reviewed: 03/23/2013 Elsevier Interactive Patient Education  2017 [employer].

## 2017-11-17 NOTE — Progress Notes (Addendum)
[address]       [place] [id number]             [phone]      5 Days Post-Op Procedure(s) (LRB): CORONARY ARTERY BYPASS GRAFTING (CABG) x Three , using left internal mammary artery and right leg greater saphenous vein harvested endoscopically (N/A) TRANSESOPHAGEAL ECHOCARDIOGRAM (TEE) (N/A) Subjective: Feels well  Objective: Vital signs in last 24 hours: Temp:  [98 F (36.7 C)-98.5 F (36.9 C)] 98.5 F (36.9 C) (01/21 0344) Pulse Rate:  [65-70] 67 (01/21 0344) Cardiac Rhythm: Normal sinus rhythm (01/21 0700) Resp:  [14-19] 16 (01/21 0344) BP: (126-157)/(61-84) 126/77 (01/21 0344) SpO2:  [95 %-100 %] 100 % (01/21 0344) Weight:  [187 lb 11.2 oz (85.1 kg)] 187 lb 11.2 oz (85.1 kg) (01/21 0344)  Hemodynamic parameters for last 24 hours:    Intake/Output from previous day: 01/20 0701 - 01/21 0700 In: 240 [P.O.:240] Out: -  Intake/Output this shift: No intake/output data recorded.  General appearance: alert, cooperative and no distress Heart: regular rate and rhythm Lungs: clear to auscultation bilaterally Abdomen: benign Extremities: no edema Wound: incis healing well  Lab Results: Recent Labs    11/15/17 0457 11/16/17 0331  WBC 17.3* 13.5*  HGB 9.0* 8.3*  HCT 27.3* 25.7*  PLT 201 254   BMET:  Recent Labs    11/15/17 0457 11/16/17 0331  NA 136 138  K 4.0 3.5  CL 100* 102  CO2 24 26  GLUCOSE 96 114*  BUN 14 11  CREATININE 0.92 0.87  CALCIUM 9.0 8.8*    PT/INR: No results for input(s): LABPROT, INR in the last 72 hours. ABG    Component Value Date/Time   PHART 7.343 (L) 11/13/2017 0537   HCO3 23.2 11/13/2017 0537   TCO2 23 11/13/2017 1639   ACIDBASEDEF 2.0 11/13/2017 0537   O2SAT 97.0 11/13/2017 0537   CBG (last 3)  Recent Labs    11/16/17 1601 11/16/17 2201 11/17/17 0641  GLUCAP 64* 76 91    Meds Scheduled Meds: . acetaminophen  1,000 mg Oral Q6H   Or  . acetaminophen (TYLENOL) oral liquid 160 mg/5 mL   1,000 mg Per Tube Q6H  . amiodarone  200 mg Oral Daily  . aspirin EC  325 mg Oral Daily   Or  . aspirin  324 mg Per Tube Daily  . atorvastatin  80 mg Oral q1800  . bisacodyl  10 mg Oral Daily   Or  . bisacodyl  10 mg Rectal Daily  . docusate sodium  200 mg Oral Daily  . furosemide  40 mg Oral Daily  . insulin aspart  0-24 Units Subcutaneous TID AC & HS  . mouth rinse  15 mL Mouth Rinse BID  . metFORMIN  500 mg Oral BID WC  . metoCLOPramide (REGLAN) injection  10 mg Intravenous Q6H  . metoprolol tartrate  12.5 mg Oral BID  . mometasone-formoterol  2 puff Inhalation BID  . pantoprazole  40 mg Oral Daily  . potassium chloride  20 mEq Oral Daily  . sodium chloride flush  3 mL Intravenous Q12H   Continuous Infusions: . sodium chloride     PRN Meds:.sodium chloride, levalbuterol, ondansetron (ZOFRAN) IV, oxyCODONE, sodium chloride flush, traMADol  Xrays No results found.  Assessment/Plan: S/P Procedure(s) (LRB): CORONARY ARTERY BYPASS GRAFTING (CABG) x Three , using left internal mammary artery and right leg greater saphenous vein harvested endoscopically (N/A) TRANSESOPHAGEAL ECHOCARDIOGRAM (TEE) (N/A) Plan for  discharge: see discharge orders Will add lisinopril for htn Sugars ok on metformin  LOS: 12 days    Rowe Clack 11/17/2017 patient examined and medical record reviewed,agree with above note. Kathlee Nations Trigt III 11/17/2017

## 2017-11-20 ENCOUNTER — Telehealth: Payer: Self-pay

## 2017-11-20 DIAGNOSIS — G8918 Other acute postprocedural pain: Secondary | ICD-10-CM

## 2017-11-20 MED ORDER — TRAMADOL HCL 50 MG PO TABS: 50.0000 mg | ORAL_TABLET | Freq: Four times a day (QID) | ORAL | 0 refills | Status: DC | PRN

## 2017-11-20 NOTE — Telephone Encounter (Signed)
Patient is C/O hallucinations after taking oxycodone. Requesting a different pain med. I will fax new RX to [facility] for Tramadol 50 mg po every 6 hours prn/pain #40

## 2017-11-21 NOTE — Addendum Note (Signed)
Addendum  created 11/21/17 [id number] by Dorris SinghGreen, Doneen Ollinger, MD   Intraprocedure Blocks edited, Sign clinical note

## 2017-11-25 ENCOUNTER — Telehealth: Payer: Self-pay

## 2017-11-25 DIAGNOSIS — E782 Mixed hyperlipidemia: Secondary | ICD-10-CM

## 2017-11-25 MED ORDER — ATORVASTATIN CALCIUM 80 MG PO TABS: 80.0000 mg | ORAL_TABLET | Freq: Every day | ORAL | 3 refills | Status: DC

## 2017-11-25 NOTE — Telephone Encounter (Signed)
Called in RX for Blood glucose meter and supplies, Check glucose levels once a day And Lipitor 80 mg po every day called to pharm./ The Rx's were not sent at hospital discharge.

## 2017-11-28 ENCOUNTER — Other Ambulatory Visit: Payer: Self-pay

## 2017-11-28 MED ORDER — ONDANSETRON HCL 4 MG PO TABS: 4.0000 mg | ORAL_TABLET | Freq: Three times a day (TID) | ORAL | 0 refills | Status: DC | PRN

## 2017-12-01 ENCOUNTER — Telehealth: Payer: Self-pay

## 2017-12-01 NOTE — Telephone Encounter (Signed)
Patient called the office this morning concerned because she has had a decrease in her appetite since surgery and a tightening in her throat.  I explained to her that she needed to get into see her Cardiologist about medications and possible relation to a decrease in appetite.  I also explained that she may be feeling this way for some time after surgery and this was normal.  I did also advise her to go to her primary care physician to have the tightening in her throat looked at as this could also be a cause in the decrease in appetite.  She acknowledged receipt.

## 2017-12-08 ENCOUNTER — Emergency Department (HOSPITAL_COMMUNITY): Payer: BLUE CROSS/BLUE SHIELD

## 2017-12-08 ENCOUNTER — Encounter (HOSPITAL_COMMUNITY): Payer: Self-pay | Admitting: Emergency Medicine

## 2017-12-08 ENCOUNTER — Other Ambulatory Visit: Payer: Self-pay

## 2017-12-08 ENCOUNTER — Observation Stay (HOSPITAL_COMMUNITY): Payer: BLUE CROSS/BLUE SHIELD

## 2017-12-08 ENCOUNTER — Inpatient Hospital Stay (HOSPITAL_COMMUNITY)
Admission: EM | Admit: 2017-12-08 | Discharge: 2017-12-10 | DRG: 287 | Disposition: A | Discharge status: Home / Self Care | Payer: BLUE CROSS/BLUE SHIELD | Attending: Cardiovascular Disease | Admitting: Cardiovascular Disease

## 2017-12-08 DIAGNOSIS — I25110 Atherosclerotic heart disease of native coronary artery with unstable angina pectoris: Principal | ICD-10-CM | POA: Diagnosis present

## 2017-12-08 DIAGNOSIS — R079 Chest pain, unspecified: Secondary | ICD-10-CM

## 2017-12-08 DIAGNOSIS — J449 Chronic obstructive pulmonary disease, unspecified: Secondary | ICD-10-CM | POA: Diagnosis present

## 2017-12-08 DIAGNOSIS — Z951 Presence of aortocoronary bypass graft: Secondary | ICD-10-CM | POA: Diagnosis not present

## 2017-12-08 DIAGNOSIS — Z720 Tobacco use: Secondary | ICD-10-CM | POA: Diagnosis present

## 2017-12-08 DIAGNOSIS — Z9049 Acquired absence of other specified parts of digestive tract: Secondary | ICD-10-CM

## 2017-12-08 DIAGNOSIS — I2089 Other forms of angina pectoris: Secondary | ICD-10-CM | POA: Insufficient documentation

## 2017-12-08 DIAGNOSIS — E7800 Pure hypercholesterolemia, unspecified: Secondary | ICD-10-CM | POA: Diagnosis not present

## 2017-12-08 DIAGNOSIS — I252 Old myocardial infarction: Secondary | ICD-10-CM

## 2017-12-08 DIAGNOSIS — E1151 Type 2 diabetes mellitus with diabetic peripheral angiopathy without gangrene: Secondary | ICD-10-CM | POA: Diagnosis present

## 2017-12-08 DIAGNOSIS — F17210 Nicotine dependence, cigarettes, uncomplicated: Secondary | ICD-10-CM | POA: Diagnosis present

## 2017-12-08 DIAGNOSIS — E1169 Type 2 diabetes mellitus with other specified complication: Secondary | ICD-10-CM | POA: Diagnosis present

## 2017-12-08 DIAGNOSIS — I200 Unstable angina: Secondary | ICD-10-CM | POA: Diagnosis present

## 2017-12-08 DIAGNOSIS — I2510 Atherosclerotic heart disease of native coronary artery without angina pectoris: Secondary | ICD-10-CM | POA: Diagnosis present

## 2017-12-08 DIAGNOSIS — E785 Hyperlipidemia, unspecified: Secondary | ICD-10-CM | POA: Insufficient documentation

## 2017-12-08 DIAGNOSIS — I10 Essential (primary) hypertension: Secondary | ICD-10-CM | POA: Diagnosis present

## 2017-12-08 DIAGNOSIS — R0789 Other chest pain: Secondary | ICD-10-CM | POA: Diagnosis not present

## 2017-12-08 DIAGNOSIS — I208 Other forms of angina pectoris: Secondary | ICD-10-CM | POA: Insufficient documentation

## 2017-12-08 LAB — I-STAT CHEM 8, ED
BUN: 5 mg/dL — ABNORMAL LOW (ref 6–20)
Calcium, Ion: 1.17 mmol/L (ref 1.15–1.40)
Chloride: 100 mmol/L — ABNORMAL LOW (ref 101–111)
Creatinine, Ser: 1 mg/dL (ref 0.44–1.00)
Glucose, Bld: 94 mg/dL (ref 65–99)
HEMATOCRIT: 35 % — AB (ref 36.0–46.0)
HEMOGLOBIN: 11.9 g/dL — AB (ref 12.0–15.0)
Potassium: 3.8 mmol/L (ref 3.5–5.1)
SODIUM: 138 mmol/L (ref 135–145)
TCO2: 26 mmol/L (ref 22–32)

## 2017-12-08 LAB — CBC
HCT: 32.6 % — ABNORMAL LOW (ref 36.0–46.0)
HEMATOCRIT: 34 % — AB (ref 36.0–46.0)
Hemoglobin: 11 g/dL — ABNORMAL LOW (ref 12.0–15.0)
Hemoglobin: 10.4 g/dL — ABNORMAL LOW (ref 12.0–15.0)
MCH: 29.9 pg (ref 26.0–34.0)
MCH: 29.4 pg (ref 26.0–34.0)
MCHC: 32.4 g/dL (ref 30.0–36.0)
MCHC: 31.9 g/dL (ref 30.0–36.0)
MCV: 92.4 fL (ref 78.0–100.0)
MCV: 92.1 fL (ref 78.0–100.0)
PLATELETS: 513 10*3/uL — AB (ref 150–400)
Platelets: 514 10*3/uL — ABNORMAL HIGH (ref 150–400)
RBC: 3.68 MIL/uL — ABNORMAL LOW (ref 3.87–5.11)
RBC: 3.54 MIL/uL — ABNORMAL LOW (ref 3.87–5.11)
RDW: 13.1 % (ref 11.5–15.5)
RDW: 13.1 % (ref 11.5–15.5)
WBC: 10.2 10*3/uL (ref 4.0–10.5)
WBC: 10 10*3/uL (ref 4.0–10.5)

## 2017-12-08 LAB — BASIC METABOLIC PANEL
Anion gap: 15 (ref 5–15)
BUN: 6 mg/dL (ref 6–20)
CALCIUM: 9.6 mg/dL (ref 8.9–10.3)
CO2: 21 mmol/L — ABNORMAL LOW (ref 22–32)
CREATININE: 1.06 mg/dL — AB (ref 0.44–1.00)
Chloride: 101 mmol/L (ref 101–111)
GFR calc Af Amer: >60 mL/min (ref >60)
GFR, EST NON AFRICAN AMERICAN: 56 mL/min — AB (ref >60)
GLUCOSE: 103 mg/dL — AB (ref 65–99)
POTASSIUM: 3.9 mmol/L (ref 3.5–5.1)
SODIUM: 137 mmol/L (ref 135–145)

## 2017-12-08 LAB — TROPONIN I
Troponin I: <0.03 ng/mL (ref <0.03)
Troponin I: 0.04 ng/mL (ref <0.03)
Troponin I: <0.03 ng/mL (ref <0.03)

## 2017-12-08 LAB — CREATININE, SERUM
CREATININE: 1.02 mg/dL — AB (ref 0.44–1.00)
GFR calc Af Amer: >60 mL/min (ref >60)
GFR calc non Af Amer: 59 mL/min — ABNORMAL LOW (ref >60)

## 2017-12-08 LAB — NM MYOCAR MULTI W/SPECT W/WALL MOTION / EF
CHL CUP RESTING HR STRESS: 76 {beats}/min
CSEPED: 4 min

## 2017-12-08 LAB — I-STAT TROPONIN, ED: Troponin i, poc: 0 ng/mL (ref 0.00–0.08)

## 2017-12-08 LAB — I-STAT BETA HCG BLOOD, ED (MC, WL, AP ONLY): I-stat hCG, quantitative: 7.4 m[IU]/mL — ABNORMAL HIGH (ref <5)

## 2017-12-08 MED ORDER — TECHNETIUM TC 99M TETROFOSMIN IV KIT
30.0000 | PACK | Freq: Once | INTRAVENOUS | Status: AC | PRN
  Administered 2017-12-08: 30 via INTRAVENOUS

## 2017-12-08 MED ORDER — SODIUM CHLORIDE 0.9% FLUSH
3.0000 mL | Freq: Two times a day (BID) | INTRAVENOUS | Status: DC
  Administered 2017-12-08: 3 mL via INTRAVENOUS

## 2017-12-08 MED ORDER — GI COCKTAIL ~~LOC~~
30.0000 mL | Freq: Once | ORAL | Status: AC
  Administered 2017-12-08: 30 mL via ORAL
  Filled 2017-12-08: qty 30

## 2017-12-08 MED ORDER — METFORMIN HCL 500 MG PO TABS: 500.0000 mg | ORAL_TABLET | Freq: Two times a day (BID) | ORAL | Status: DC

## 2017-12-08 MED ORDER — NITROGLYCERIN 0.4 MG SL SUBL
0.4000 mg | SUBLINGUAL_TABLET | SUBLINGUAL | Status: DC | PRN
  Administered 2017-12-08: 0.4 mg via SUBLINGUAL

## 2017-12-08 MED ORDER — PANTOPRAZOLE SODIUM 40 MG PO TBEC
40.0000 mg | DELAYED_RELEASE_TABLET | Freq: Every day | ORAL | Status: DC
  Administered 2017-12-08 – 2017-12-10 (×3): 40 mg via ORAL
  Filled 2017-12-08 (×3): qty 1

## 2017-12-08 MED ORDER — ACETAMINOPHEN 325 MG PO TABS: 650.0000 mg | ORAL_TABLET | ORAL | Status: DC | PRN

## 2017-12-08 MED ORDER — LISINOPRIL 10 MG PO TABS
10.0000 mg | ORAL_TABLET | Freq: Every day | ORAL | Status: DC
  Administered 2017-12-08 – 2017-12-10 (×3): 10 mg via ORAL
  Filled 2017-12-08 (×3): qty 1

## 2017-12-08 MED ORDER — TRAMADOL HCL 50 MG PO TABS
50.0000 mg | ORAL_TABLET | Freq: Four times a day (QID) | ORAL | Status: DC | PRN
  Administered 2017-12-08 – 2017-12-09 (×2): 50 mg via ORAL
  Filled 2017-12-08 (×2): qty 1

## 2017-12-08 MED ORDER — ATORVASTATIN CALCIUM 80 MG PO TABS
80.0000 mg | ORAL_TABLET | Freq: Every day | ORAL | Status: DC
  Administered 2017-12-08 – 2017-12-09 (×2): 80 mg via ORAL
  Filled 2017-12-08 (×2): qty 1

## 2017-12-08 MED ORDER — HEPARIN SODIUM (PORCINE) 5000 UNIT/ML IJ SOLN
5000.0000 [IU] | Freq: Three times a day (TID) | INTRAMUSCULAR | Status: DC
  Administered 2017-12-08 – 2017-12-09 (×3): 5000 [IU] via SUBCUTANEOUS
  Filled 2017-12-08 (×3): qty 1

## 2017-12-08 MED ORDER — REGADENOSON 0.4 MG/5ML IV SOLN
INTRAVENOUS | Status: AC
  Administered 2017-12-08: 0.4 mg via INTRAVENOUS
  Filled 2017-12-08: qty 5

## 2017-12-08 MED ORDER — ASPIRIN 81 MG PO CHEW
81.0000 mg | CHEWABLE_TABLET | ORAL | Status: AC
  Administered 2017-12-09: 81 mg via ORAL
  Filled 2017-12-08: qty 1

## 2017-12-08 MED ORDER — OXYCODONE HCL 5 MG PO TABS
5.0000 mg | ORAL_TABLET | Freq: Four times a day (QID) | ORAL | Status: DC | PRN
  Administered 2017-12-09 – 2017-12-10 (×2): 10 mg via ORAL
  Filled 2017-12-08 (×2): qty 2

## 2017-12-08 MED ORDER — SODIUM CHLORIDE 0.9 % WEIGHT BASED INFUSION: 3.0000 mL/kg/h | INTRAVENOUS | Status: DC

## 2017-12-08 MED ORDER — FENTANYL CITRATE (PF) 100 MCG/2ML IJ SOLN
50.0000 ug | Freq: Once | INTRAMUSCULAR | Status: AC
  Administered 2017-12-08: 50 ug via INTRAVENOUS
  Filled 2017-12-08: qty 2

## 2017-12-08 MED ORDER — SODIUM CHLORIDE 0.9 % WEIGHT BASED INFUSION: 1.0000 mL/kg/h | INTRAVENOUS | Status: DC

## 2017-12-08 MED ORDER — ASPIRIN EC 325 MG PO TBEC
325.0000 mg | DELAYED_RELEASE_TABLET | Freq: Every day | ORAL | Status: DC
  Administered 2017-12-08 – 2017-12-10 (×2): 325 mg via ORAL
  Filled 2017-12-08 (×2): qty 1

## 2017-12-08 MED ORDER — METOPROLOL TARTRATE 12.5 MG HALF TABLET
12.5000 mg | ORAL_TABLET | Freq: Two times a day (BID) | ORAL | Status: DC
  Administered 2017-12-08 – 2017-12-10 (×5): 12.5 mg via ORAL
  Filled 2017-12-08 (×5): qty 1

## 2017-12-08 MED ORDER — ONDANSETRON HCL 4 MG PO TABS: 4.0000 mg | ORAL_TABLET | Freq: Three times a day (TID) | ORAL | Status: DC | PRN

## 2017-12-08 MED ORDER — TECHNETIUM TC 99M TETROFOSMIN IV KIT
10.0000 | PACK | Freq: Once | INTRAVENOUS | Status: AC | PRN
  Administered 2017-12-08: 10 via INTRAVENOUS

## 2017-12-08 MED ORDER — ONDANSETRON HCL 4 MG/2ML IJ SOLN: 4.0000 mg | Freq: Four times a day (QID) | INTRAMUSCULAR | Status: DC | PRN

## 2017-12-08 MED ORDER — NITROGLYCERIN 0.4 MG SL SUBL
SUBLINGUAL_TABLET | SUBLINGUAL | Status: AC
  Administered 2017-12-08: 0.4 mg via SUBLINGUAL
  Filled 2017-12-08: qty 1

## 2017-12-08 MED ORDER — REGADENOSON 0.4 MG/5ML IV SOLN
0.4000 mg | Freq: Once | INTRAVENOUS | Status: AC
  Administered 2017-12-08: 0.4 mg via INTRAVENOUS
  Filled 2017-12-08: qty 5

## 2017-12-08 MED ORDER — SODIUM CHLORIDE 0.9% FLUSH: 3.0000 mL | INTRAVENOUS | Status: DC | PRN

## 2017-12-08 MED ORDER — IOPAMIDOL (ISOVUE-370) INJECTION 76%
INTRAVENOUS | Status: AC
  Administered 2017-12-08: 100 mL
  Filled 2017-12-08: qty 100

## 2017-12-08 MED ORDER — SODIUM CHLORIDE 0.9 % IV SOLN: 250.0000 mL | INTRAVENOUS | Status: DC | PRN

## 2017-12-08 NOTE — Progress Notes (Signed)
   Caitlyn Herrera presented for a nuclear stress test today. Towards the end of the stress test, patient developed substernal chest pain. After completion of the study, patient was given SL nitro with relief of symptoms. Stress imaging is pending at this time.  Preliminary EKG findings may be listed in the chart, but the stress test result will not be finalized until perfusion imaging is complete.  One day study, [facility] to read.  Beatriz StallionKrista M. Kroeger, PA-C 12/08/2017, 1:26 PM

## 2017-12-08 NOTE — ED Provider Notes (Addendum)
[facility] EMERGENCY DEPARTMENT Provider Note   CSN: [id number] Arrival date & time: 12/08/17  0429     History   Chief Complaint Chief Complaint  Patient presents with  . Chest Pain    HPI Caitlyn Herrera is a 57 y.o. female.  The history is provided by the patient.  Chest Pain   This is a recurrent problem. The current episode started 6 to 12 hours ago. Episode frequency: episodically, each episode lasting 15 or so minutes. The problem has not changed since onset.The pain is associated with rest. The pain is present in the substernal region. The pain is moderate. The quality of the pain is described as dull and sharp. Radiates to: right arm and between the shoulder blades  Associated symptoms include nausea. Pertinent negatives include no abdominal pain, no cough, no fever, no hemoptysis, no near-syncope, no sputum production and no syncope. She has tried nitroglycerin for the symptoms. The treatment provided mild relief. Risk factors include post-menopausal.  Her past medical history is significant for MI.  Pertinent negatives for family medical history include: no Marfan's syndrome.  Procedure history is positive for cardiac catheterization and echocardiogram.  Chest pain with multiple episodes lasting 15 minutes at rest.  Patient states this is just like her symptoms with her NSTEMI that led to her bypass.  No f/c/r.  No leg pain.    Past Medical History:  Diagnosis Date  . CAD (coronary artery disease)    a. NSTEMI 11/04/17; b. LHC dLM 30%, mLAD 90%, p-mLCx 80%, OM1 99%, OM2 30%, pRCA 80% mRCA 60%, dRCA 90%  . Hyperlipidemia   . Hypertension   . NSTEMI (non-ST elevated myocardial infarction) (HCC) 11/04/2017   a. TTE 11/04/2017: EF of 65-70%, no RWMA, normal LV diastolic function, normal RV cavity size and systolic function, mild TR  . Pure hypercholesterolemia 11/07/2017    Patient Active Problem List   Diagnosis Date Noted  . S/P CABG x 3 11/12/2017    . Non-insulin dependent type 2 diabetes mellitus (HCC) 11/10/2017  . 3-vessel CAD 11/10/2017  . HCVD (hypertensive cardiovascular disease) 11/10/2017  . UTI (urinary tract infection) 11/10/2017  . Pulmonary nodule, right 11/10/2017  . Dyslipidemia 11/07/2017  . Tobacco abuse   . NSTEMI (non-ST elevated myocardial infarction) (HCC) 11/05/2017    Past Surgical History:  Procedure Laterality Date  . BREAST CYST EXCISION Right   . CARDIAC CATHETERIZATION    . CORONARY ARTERY BYPASS GRAFT N/A 11/12/2017   Procedure: CORONARY ARTERY BYPASS GRAFTING (CABG) x Three , using left internal mammary artery and right leg greater saphenous vein harvested endoscopically;  Surgeon: Kerin Perna, MD;  Location: [facility] OR;  Service: Open Heart Surgery;  Laterality: N/A;  . DILATION AND CURETTAGE OF UTERUS    . LAPAROSCOPIC CHOLECYSTECTOMY    . LEFT HEART CATH AND CORONARY ANGIOGRAPHY N/A 11/04/2017   Procedure: LEFT HEART CATH AND CORONARY ANGIOGRAPHY;  Surgeon: Yvonne Kendall, MD;  Location: [facility] INVASIVE CV LAB;  Service: Cardiovascular;  Laterality: N/A;  . TEE WITHOUT CARDIOVERSION N/A 11/12/2017   Procedure: TRANSESOPHAGEAL ECHOCARDIOGRAM (TEE);  Surgeon: Donata Clay, Theron Arista, MD;  Location: [facility] OR;  Service: Open Heart Surgery;  Laterality: N/A;    OB History    No data available       Home Medications    Prior to Admission medications   Medication Sig Start Date End Date Taking? Authorizing Provider  amiodarone (PACERONE) 200 MG tablet Take 1 tablet (200 mg total) by  mouth daily. 11/17/17  Yes Gold, Glenice Laine, PA-C  aspirin EC 325 MG EC tablet Take 1 tablet (325 mg total) by mouth daily. 11/17/17  Yes Gold, Wayne E, PA-C  atorvastatin (LIPITOR) 80 MG tablet Take 1 tablet (80 mg total) by mouth daily at 6 PM. 11/25/17  Yes Donata Clay, Theron Arista, MD  lisinopril (PRINIVIL,ZESTRIL) 10 MG tablet Take 1 tablet (10 mg total) by mouth daily. 11/17/17  Yes Gold, Wayne E, PA-C  metFORMIN (GLUCOPHAGE) 500 MG tablet  Take 1 tablet (500 mg total) by mouth 2 (two) times daily with a meal. 11/17/17  Yes Gold, Wayne E, PA-C  metoprolol tartrate (LOPRESSOR) 25 MG tablet Take 0.5 tablets (12.5 mg total) by mouth 2 (two) times daily. 11/17/17  Yes Gold, Wayne E, PA-C  ondansetron (ZOFRAN) 4 MG tablet Take 1 tablet (4 mg total) by mouth every 8 (eight) hours as needed for nausea or vomiting. 11/28/17  Yes Kerin Perna, MD  traMADol (ULTRAM) 50 MG tablet Take 1 tablet (50 mg total) by mouth every 6 (six) hours as needed. 11/20/17  Yes Delight Ovens, MD  oxyCODONE (OXY IR/ROXICODONE) 5 MG immediate release tablet Take 1-2 tablets (5-10 mg total) by mouth every 6 (six) hours as needed for severe pain. Patient not taking: Reported on 12/08/2017 11/17/17   Rowe Clack, PA-C    Family History Family History  Problem Relation Age of Onset  . Hypertension Mother     Social History Social History   Tobacco Use  . Smoking status: Current Every Day Smoker    Packs/day: 1.00    Years: 42.00    Pack years: 42.00    Types: Cigarettes  . Smokeless tobacco: Never Used  . Tobacco comment: 11/05/2017 "stopped smoking 11/01/2017"  Substance Use Topics  . Alcohol use: No  . Drug use: No     Allergies   Patient has no known allergies.   Review of Systems Review of Systems  Constitutional: Negative for chills and fever.  Respiratory: Negative for cough, hemoptysis, sputum production and wheezing.   Cardiovascular: Positive for chest pain. Negative for leg swelling, syncope and near-syncope.  Gastrointestinal: Positive for nausea. Negative for abdominal pain.  Genitourinary: Negative for flank pain.  All other systems reviewed and are negative.    Physical Exam Updated Vital Signs BP 136/68   Pulse 78   Temp 98 F (36.7 C) (Oral)   Resp 16   Ht 5\' 6"  (1.676 m)   Wt 86.2 kg (190 lb)   SpO2 100%   BMI 30.67 kg/m   Physical Exam  Constitutional: She is oriented to person, place, and time. She appears  well-developed and well-nourished. No distress.  HENT:  Head: Normocephalic and atraumatic.  Mouth/Throat: No oropharyngeal exudate.  Eyes: Conjunctivae are normal. Pupils are equal, round, and reactive to light.  Neck: Normal range of motion. Neck supple. No JVD present.  Cardiovascular: Normal rate, regular rhythm, normal heart sounds and intact distal pulses.  Pulmonary/Chest: Effort normal and breath sounds normal. No stridor. She has no wheezes. She has no rales.  Abdominal: Soft. Bowel sounds are normal. She exhibits no mass. There is no tenderness. There is no guarding.  Musculoskeletal: Normal range of motion. She exhibits no edema or tenderness.  Neurological: She is alert and oriented to person, place, and time. She displays normal reflexes.  Skin: Skin is dry. Capillary refill takes less than 2 seconds.  Psychiatric: She has a normal mood and affect.  ED Treatments / Results  Labs (all labs ordered are listed, but only abnormal results are displayed)  Results for orders placed or performed during the hospital encounter of 12/08/17  Basic metabolic panel  Result Value Ref Range   Sodium 137 135 - 145 mmol/L   Potassium 3.9 3.5 - 5.1 mmol/L   Chloride 101 101 - 111 mmol/L   CO2 21 (L) 22 - 32 mmol/L   Glucose, Bld 103 (H) 65 - 99 mg/dL   BUN 6 6 - 20 mg/dL   Creatinine, Ser 1.611.06 (H) 0.44 - 1.00 mg/dL   Calcium 9.6 8.9 - 09.610.3 mg/dL   GFR calc non Af Amer 56 (L) >60 mL/min   GFR calc Af Amer >60 >60 mL/min   Anion gap 15 5 - 15  CBC  Result Value Ref Range   WBC 10.2 4.0 - 10.5 K/uL   RBC 3.68 (L) 3.87 - 5.11 MIL/uL   Hemoglobin 11.0 (L) 12.0 - 15.0 g/dL   HCT 04.534.0 (L) 40.936.0 - 81.146.0 %   MCV 92.4 78.0 - 100.0 fL   MCH 29.9 26.0 - 34.0 pg   MCHC 32.4 30.0 - 36.0 g/dL   RDW 91.413.1 78.211.5 - 95.615.5 %   Platelets 513 (H) 150 - 400 K/uL  I-stat troponin, ED  Result Value Ref Range   Troponin i, poc 0.00 0.00 - 0.08 ng/mL   Comment 3          I-Stat beta hCG blood, ED    Result Value Ref Range   I-stat hCG, quantitative 7.4 (H) <5 mIU/mL   Comment 3          I-Stat Chem 8, ED  Result Value Ref Range   Sodium 138 135 - 145 mmol/L   Potassium 3.8 3.5 - 5.1 mmol/L   Chloride 100 (L) 101 - 111 mmol/L   BUN 5 (L) 6 - 20 mg/dL   Creatinine, Ser 2.131.00 0.44 - 1.00 mg/dL   Glucose, Bld 94 65 - 99 mg/dL   Calcium, Ion 0.861.17 5.781.15 - 1.40 mmol/L   TCO2 26 22 - 32 mmol/L   Hemoglobin 11.9 (L) 12.0 - 15.0 g/dL   HCT 46.935.0 (L) 62.936.0 - 52.846.0 %    EKG  EKG Interpretation  Date/Time:  Monday December 08 2017 04:40:28 EST Ventricular Rate:  78 PR Interval:    QRS Duration: 94 QT Interval:  564 QTC Calculation: 643 R Axis:   2 Text Interpretation:  Sinus rhythm Probable left atrial enlargement LVH with secondary repolarization abnormality Probable anterior infarct, age indeterminate Prolonged QT interval Confirmed by Saraih Lorton ([id number]) on 12/08/2017 4:57:03 AM       Radiology Dg Chest 2 View  Result Date: 12/08/2017 CLINICAL DATA:  Acute onset of mid chest pain. Recent heart surgery. EXAM: CHEST  2 VIEW COMPARISON:  Chest radiograph performed 11/15/2017 FINDINGS: A small left pleural effusion is noted. Left basilar airspace opacity likely reflects atelectasis. No pneumothorax is seen. The heart is normal in size. The patient is status post median sternotomy, with evidence of prior CABG. No acute osseous abnormalities are identified. IMPRESSION: Small left pleural effusion. Left basilar airspace opacity likely reflects atelectasis. Electronically Signed   By: Roanna RaiderJeffery  Chang M.D.   On: 12/08/2017 05:14    Procedures Procedures (including critical care time)  Medications Ordered in ED Medications  iopamidol (ISOVUE-370) 76 % injection (not administered)  fentaNYL (SUBLIMAZE) injection 50 mcg (50 mcg Intravenous Given 12/08/17 0524)     Case d/w  cardiology who will see the patient   Case d/w Dr. Lowella Fairy, please call Dr. Donata Clay at 7 am    Case d/w Dr. Donata Clay, admit to cards.  Dr. Donata Clay to see   Final Clinical Impressions(s) / ED Diagnoses   Chest pain, status post cabg.  HEART score is 7.     Sueanne Maniaci, MD 12/08/17 [id number]    Cy Blamer, MD 12/08/17 [id number]    Cy Blamer, MD 12/08/17 [id number]

## 2017-12-08 NOTE — H&P (Signed)
Cardiology Admission History and Physical:   Patient ID: Caitlyn Herrera; MRN: [id number]; DOB: Apr 14, 1961   Admission date: 12/08/2017  Primary Care Provider: [name] Primary Cardiologist: Yvonne Kendall, MD   Chief Complaint:  Chest pain   Patient Profile:   Caitlyn Herrera is a 57 y.o. female with a history of HTN, HLD and recent admission for CABG presented for chest pressure evaluation.   She presented [facility] 11/04/17 with chest pain. Ruled in for NSTEMI. Cath showed 3 V disease s/p CABG x 3 on 11/12/17. Had post op anemia. Preop: PFTs revealed moderate COPD, mild bilateral carotid plaque and moderate ABI bilateral LE.  The patient was discharged on amiodarone 200mg  qd. No documented atrial fibrillation.   History of Present Illness:   [name] presented with 12 hours history of intermittent chest pain. She was doing well after discharge. However for the past one week, she had nausea, vomiting, diarrhea and food/tablet stock on throat. New after CABG. no fever or chills.  No sick contact.  Since yesterday 6 PM, patient has intermittent chest pressure without associated shortness of breath or radiation.  She had nausea.  Her pressure lasts for 15 minutes then resolved for a few minutes and recurs.  Similar to recent admission for non-STEMI.  Called EMS and given sublingual nitroglycerin x 1 and route.  Her pressure improved from 7 to 5 but  never resolved.  Her pain is worse while laying on the right side.  Feels better on left side.  She denies orthopnea, PND, syncope, dizziness, lower extremity edema or melena.  She has quit smoking.  She feels her chest pressure coming from deep inside.  In the emergency room, electrolytes and serum creatinine were normal.  Point-of-care troponin normal.  Hemoglobin stable.  Chest x-ray showed small left pleural effusion.  EKG showed T wave elevation inferiorly and nonspecific biphasic T wave in lateral lead.  No  significant changes compared to EKG of prior admission-personally reviewed.  CT angiogram of the chest/abdomen/pelvis IMPRESSION: 1. Tiny anterior outpouchings of aortic arch and right brachiocephalic artery just upstream to left common carotid artery origin represent tiny penetrating ulcers or ulcerated plaque. 2. No aortic dissection or aneurysm. 3. Fibrofatty plaque of SMA with tandem segments of mild less than 50% stenosis. 4. Moderate 50-70% stenosis of left renal artery origin. 5. Small to moderate left pleural effusion. 6. Postsurgical changes related to median sternotomy and CABG with mild residual anterior mediastinal edema. No fluid collection. 7. 2.5 cm abdominal aortic ectasia. Ectatic abdominal aorta at risk for aneurysm development. Recommend followup by ultrasound in 5 years. This recommendation follows ACR consensus guidelines: White Paper of the ACR Incidental Findings Committee II on Vascular Findings. J Am Coll Radiol 2013; 10:789-794.    Past Medical History:  Diagnosis Date  . CAD (coronary artery disease)    a. NSTEMI 11/04/17; b. LHC dLM 30%, mLAD 90%, p-mLCx 80%, OM1 99%, OM2 30%, pRCA 80% mRCA 60%, dRCA 90%  . Hyperlipidemia   . Hypertension   . NSTEMI (non-ST elevated myocardial infarction) (HCC) 11/04/2017   a. TTE 11/04/2017: EF of 65-70%, no RWMA, normal LV diastolic function, normal RV cavity size and systolic function, mild TR  . Pure hypercholesterolemia 11/07/2017    Past Surgical History:  Procedure Laterality Date  . BREAST CYST EXCISION Right   . CARDIAC CATHETERIZATION    . CORONARY ARTERY BYPASS GRAFT N/A 11/12/2017   Procedure: CORONARY ARTERY BYPASS GRAFTING (CABG) x Three ,  using left internal mammary artery and right leg greater saphenous vein harvested endoscopically;  Surgeon: Kerin Perna, MD;  Location[facility] OR;  Service: Open Heart Surgery;  Laterality: N/A;  . DILATION AND CURETTAGE OF UTERUS    . LAPAROSCOPIC CHOLECYSTECTOMY    .  LEFT HEART CATH AND CORONARY ANGIOGRAPHY N/A 11/04/2017   Procedure: LEFT HEART CATH AND CORONARY ANGIOGRAPHY;  Surgeon: Yvonne Kendall, MD;  Location: [facility] INVASIVE CV LAB;  Service: Cardiovascular;  Laterality: N/A;  . TEE WITHOUT CARDIOVERSION N/A 11/12/2017   Procedure: TRANSESOPHAGEAL ECHOCARDIOGRAM (TEE);  Surgeon: Donata Clay, Theron Arista, MD;  Location: [facility] OR;  Service: Open Heart Surgery;  Laterality: N/A;     Medications Prior to Admission: Prior to Admission medications   Medication Sig Start Date End Date Taking? Authorizing Provider  amiodarone (PACERONE) 200 MG tablet Take 1 tablet (200 mg total) by mouth daily. 11/17/17  Yes Gold, Glenice Laine, PA-C  aspirin EC 325 MG EC tablet Take 1 tablet (325 mg total) by mouth daily. 11/17/17  Yes Gold, Wayne E, PA-C  atorvastatin (LIPITOR) 80 MG tablet Take 1 tablet (80 mg total) by mouth daily at 6 PM. 11/25/17  Yes Donata Clay, Theron Arista, MD  lisinopril (PRINIVIL,ZESTRIL) 10 MG tablet Take 1 tablet (10 mg total) by mouth daily. 11/17/17  Yes Gold, Wayne E, PA-C  metFORMIN (GLUCOPHAGE) 500 MG tablet Take 1 tablet (500 mg total) by mouth 2 (two) times daily with a meal. 11/17/17  Yes Gold, Wayne E, PA-C  metoprolol tartrate (LOPRESSOR) 25 MG tablet Take 0.5 tablets (12.5 mg total) by mouth 2 (two) times daily. 11/17/17  Yes Gold, Wayne E, PA-C  ondansetron (ZOFRAN) 4 MG tablet Take 1 tablet (4 mg total) by mouth every 8 (eight) hours as needed for nausea or vomiting. 11/28/17  Yes Kerin Perna, MD  traMADol (ULTRAM) 50 MG tablet Take 1 tablet (50 mg total) by mouth every 6 (six) hours as needed. 11/20/17  Yes Delight Ovens, MD  oxyCODONE (OXY IR/ROXICODONE) 5 MG immediate release tablet Take 1-2 tablets (5-10 mg total) by mouth every 6 (six) hours as needed for severe pain. Patient not taking: Reported on 12/08/2017 11/17/17   Rowe Clack, PA-C     Allergies:   No Known Allergies  Social History:   Social History   Socioeconomic History  . Marital status:  Widowed    Spouse name: Not on file  . Number of children: Not on file  . Years of education: Not on file  . Highest education level: Not on file  Social Needs  . Financial resource strain: Not on file  . Food insecurity - worry: Not on file  . Food insecurity - inability: Not on file  . Transportation needs - medical: Not on file  . Transportation needs - non-medical: Not on file  Occupational History    Employer: [employer]  Tobacco Use  . Smoking status: Current Every Day Smoker    Packs/day: 1.00    Years: 42.00    Pack years: 42.00    Types: Cigarettes  . Smokeless tobacco: Never Used  . Tobacco comment: 11/05/2017 "stopped smoking 11/01/2017"  Substance and Sexual Activity  . Alcohol use: No  . Drug use: No  . Sexual activity: No  Other Topics Concern  . Not on file  Social History Narrative  . Not on file    Family History:   The patient's family history includes Hypertension in her mother.    ROS:  Please see the history of present illness.  All other ROS reviewed and negative.     Physical Exam/Data:   Vitals:   12/08/17 0530 12/08/17 0600 12/08/17 0630 12/08/17 0700  BP: 125/75 129/70 (!) 144/75 127/73  Pulse: 73  72   Resp: 17  20 17   Temp:      TempSrc:      SpO2: 100%  99%   Weight:      Height:       No intake or output data in the 24 hours ending 12/08/17 0737 Filed Weights   12/08/17 0441  Weight: 190 lb (86.2 kg)   Body mass index is 30.67 kg/m.  General:  Well nourished, well developed, in no acute distress HEENT: normal Lymph: no adenopathy Neck: no JVD Endocrine:  No thryomegaly Vascular: No carotid bruits; FA pulses 2+ bilaterally without bruits  Cardiac:  normal S1, S2; RRR; no murmur.  Midsternal surgical scar Lungs:  clear to auscultation bilaterally, no wheezing, rhonchi or rales  Abd: soft, nontender, no hepatomegaly  Ext: no edema Musculoskeletal:  No deformities, BUE and BLE strength normal and equal Skin: warm and dry    Neuro:  CNs 2-12 intact, no focal abnormalities noted Psych:  Normal affect    Relevant CV Studies: Echo 11/04/17 Study Conclusions  - Left ventricle: The cavity size was normal. Wall thickness was   increased in a pattern of moderate LVH. Systolic function was   vigorous. The estimated ejection fraction was in the range of 65%   to 70%. Wall motion was normal; there were no regional wall   motion abnormalities. Left ventricular diastolic function   parameters were normal. - Right ventricle: The cavity size was normal. Systolic function   was normal. - Tricuspid valve: There was mild regurgitation.  LEFT HEART CATH AND CORONARY ANGIOGRAPHY  Conclusion   Conclusions: 1. Severe, three-vessel coronary artery disease, as detailed below. 2. Upper normal left ventricular filling pressure.  Recommendations: 1. Transfer to tertiary care center for cardiac surgery consultation for CABG. 2. Restart heparin infusion in 2 hours. 3. Aggressive secondary prevention, including high-intensity statin therapy.    Pre-CABG carotid and LE doppler Final Interpretation: Right Carotid: There is evidence in the right ICA of a 1-39% stenosis. Difficult        exam due to high bifrucation and tissue attenuation.  Left Carotid: There is evidence in the left ICA of a 1-39% stenosis. Difficult       exam due to high bifrucation and tissue attenuation. Vertebrals: Both vertebral arteries were patent with antegrade flow. Subclavians:  Right ABI: Resting right ankle-brachial index indicates moderate right lower extremity arterial disease. Left ABI: Resting left ankle-brachial index indicates moderate left lower extremity arterial disease.  +-------+---------------+----------------+ ABI/TBIToday's ABI/TBIPrevious ABI/TBI +-------+---------------+----------------+ Right .73               +-------+---------------+----------------+ Left  .75                +-------+---------------+----------------+ Laboratory Data:  Chemistry Recent Labs  Lab 12/08/17 0441 12/08/17 0527  NA 137 138  K 3.9 3.8  CL 101 100*  CO2 21*  --   GLUCOSE 103* 94  BUN 6 5*  CREATININE 1.06* 1.00  CALCIUM 9.6  --   GFRNONAA 56*  --   GFRAA >60  --   ANIONGAP 15  --     No results for input(s): PROT, ALBUMIN, AST, ALT, ALKPHOS, BILITOT in the last 168 hours. Hematology Recent Labs  Lab 12/08/17 0441 12/08/17 0527  WBC 10.2  --   RBC 3.68*  --   HGB 11.0* 11.9*  HCT 34.0* 35.0*  MCV 92.4  --   MCH 29.9  --   MCHC 32.4  --   RDW 13.1  --   PLT 513*  --    Cardiac EnzymesNo results for input(s): TROPONINI in the last 168 hours.  Recent Labs  Lab 12/08/17 0453  TROPIPOC 0.00    BNPNo results for input(s): BNP, PROBNP in the last 168 hours.  DDimer No results for input(s): DDIMER in the last 168 hours.  Radiology/Studies:  Dg Chest 2 View  Result Date: 12/08/2017 CLINICAL DATA:  Acute onset of mid chest pain. Recent heart surgery. EXAM: CHEST  2 VIEW COMPARISON:  Chest radiograph performed 11/15/2017 FINDINGS: A small left pleural effusion is noted. Left basilar airspace opacity likely reflects atelectasis. No pneumothorax is seen. The heart is normal in size. The patient is status post median sternotomy, with evidence of prior CABG. No acute osseous abnormalities are identified. IMPRESSION: Small left pleural effusion. Left basilar airspace opacity likely reflects atelectasis. Electronically Signed   By: Roanna Raider M.D.   On: 12/08/2017 05:14   Ct Angio Chest/abd/pel For Dissection W And/or Wo Contrast  Result Date: 12/08/2017 CLINICAL DATA:  57 y/o F; centralized chest pain radiating to the right with nausea, vomiting, and back pain. EXAM: CT ANGIOGRAPHY CHEST, ABDOMEN AND PELVIS TECHNIQUE: Multidetector CT imaging through the chest, abdomen and pelvis was performed using the standard protocol during bolus administration of  intravenous contrast. Multiplanar reconstructed images and MIPs were obtained and reviewed to evaluate the vascular anatomy. CONTRAST:  ISOVUE-370 IOPAMIDOL (ISOVUE-370) INJECTION 76% COMPARISON:  11/07/2017 CT chest FINDINGS: CTA CHEST FINDINGS Cardiovascular: Normal heart size. No pericardial effusion. Status post CABG with LIMA and saphenous grafts. No central or lobar pulmonary embolus. Tiny outpouchings are present anterior to aortic arch (series 7, image 49) and of the right brachiocephalic artery at origin of left common carotid artery (series 7, image 40) which may represent penetrating ulcers or ulcerated plaque. Normal caliber thoracic aorta.  No dissection flap. Mediastinum/Nodes: No enlarged mediastinal, hilar, or axillary lymph nodes. Thyroid gland, trachea, and esophagus demonstrate no significant findings. Lungs/Pleura: Small to moderate left pleural effusion. No consolidation or pneumothorax. Musculoskeletal: Post median sternotomy with intact wires. Mild anterior mediastinal edema is likely related to residual postsurgical changes. No fluid collection. Review of the MIP images confirms the above findings. CTA ABDOMEN AND PELVIS FINDINGS VASCULAR Aorta: 2.5 cm infrarenal abdominal aorta. Mild calcific atherosclerosis. Celiac: Patent without evidence of aneurysm, dissection, vasculitis or significant stenosis. SMA: Proximal fibrofatty plaque with tandem segments of mild less than 50% stenosis. Renals: Moderate 50-70% stenosis of left renal artery origin. Patent right renal artery. IMA: Moderate to severe stenosis of IMA origin. Downstream vessel is patent. Inflow: Patent without evidence of aneurysm, dissection, vasculitis or significant stenosis. Veins: No obvious venous abnormality within the limitations of this arterial phase study. Review of the MIP images confirms the above findings. NON-VASCULAR Hepatobiliary: No focal liver abnormality is seen. Status post cholecystectomy. No biliary  dilatation. Pancreas: Unremarkable. No pancreatic ductal dilatation or surrounding inflammatory changes. Spleen: Normal in size without focal abnormality. Adrenals/Urinary Tract: Adrenal glands are unremarkable. Kidneys are normal, without renal calculi, focal lesion, or hydronephrosis. Bladder is unremarkable. Stomach/Bowel: Stomach is within normal limits. Appendix appears normal. No evidence of bowel wall thickening, distention, or inflammatory changes. Lymphatic: No lymphadenopathy. Reproductive: Uterus and  bilateral adnexa are unremarkable. Other: No abdominal wall hernia or abnormality. No abdominopelvic ascites. Musculoskeletal: No acute or significant osseous findings. Review of the MIP images confirms the above findings. IMPRESSION: 1. Tiny anterior outpouchings of aortic arch and right brachiocephalic artery just upstream to left common carotid artery origin represent tiny penetrating ulcers or ulcerated plaque. 2. No aortic dissection or aneurysm. 3. Fibrofatty plaque of SMA with tandem segments of mild less than 50% stenosis. 4. Moderate 50-70% stenosis of left renal artery origin. 5. Small to moderate left pleural effusion. 6. Postsurgical changes related to median sternotomy and CABG with mild residual anterior mediastinal edema. No fluid collection. 7. 2.5 cm abdominal aortic ectasia. Ectatic abdominal aorta at risk for aneurysm development. Recommend followup by ultrasound in 5 years. This recommendation follows ACR consensus guidelines: White Paper of the ACR Incidental Findings Committee II on Vascular Findings. J Am Coll Radiol 2013; 10:789-794. Electronically Signed   By: Mitzi HansenLance  Furusawa-Stratton M.D.   On: 12/08/2017 06:22    Assessment and Plan:   1. Chest pain s/p CABG x 3 on 11/12/17 - Both typical and atypical features.  Her chest pressure started after 1 week of nausea, vomiting and diarrhea.  She also has food/tablet stuck in her throat.  Concern for GI etiology.  CT angiogram as  noted above. -Point-of-care troponin negative.  ECG without significant changes.  Will admit and rule out. - Will get lexiscan.   2.  Nausea/vomiting with diarrhea -Patient is on amiodarone once since last admission.  Questionable side effect. Will DC amiodarone. Give GI cocktail. Swallow eval. Start PPI.   3.  Hypertension -Blood pressure relatively stable on home medication.  Will resume.   4.  Hyperlipidemia - 11/04/2017: Cholesterol 228; HDL 34; LDL Cholesterol 172; Triglycerides 110; VLDL 22  -Continue statin.  5.  Tobacco abuse -Congratulated on smoking cessation.  6. Pulmonary nodule/ Moderate COPD - Noted on presurgical evaluation. Follow up with PCP.   7. Mild carotid disease - Continue statin  8. PVD - moderate on pre-op study.   9. DM - resume metformin.   Severity of Illness: The appropriate patient status for this patient is OBSERVATION. Observation status is judged to be reasonable and necessary in order to provide the required intensity of service to ensure the patient's safety. The patient's presenting symptoms, physical exam findings, and initial radiographic and laboratory data in the context of their medical condition is felt to place them at decreased risk for further clinical deterioration. Furthermore, it is anticipated that the patient will be medically stable for discharge from the hospital within 2 midnights of admission. The following factors support the patient status of observation.   " The patient's presenting symptoms include - Chest pressure, nausea, vomiting, diarrhea. " The physical exam findings include - non specific " The initial radiographic and laboratory data are improved EKG     For questions or updates, please contact [facility] Please consult [email or web address] for contact info under Cardiology/STEMI.    Lorelei PontSigned, Fredis Malkiewicz, GeorgiaPA  12/08/2017 7:37 AM

## 2017-12-08 NOTE — ED Notes (Signed)
Patient transported to X-ray 

## 2017-12-08 NOTE — Progress Notes (Signed)
Procedure(s) (LRB): LEFT HEART CATH AND CORS/GRAFTS ANGIOGRAPHY (N/A) Subjective: Patient examined and images of CT scan of chest personally reviewed  Previous sternotomy is intact and healing No significant pericardial or pleural effusions. Atherosclerotic disease of the aorta with intimal irregularities but no significant penetrating ulceration. Continue with aspirin and statin Patient has a hiatal hernia  Objective: Vital signs in last 24 hours: Temp:  [98 F (36.7 C)-99.2 F (37.3 C)] 99.2 F (37.3 C) (02/11 0952) Pulse Rate:  [72-113] 110 (02/11 1305) Cardiac Rhythm: Normal sinus rhythm (02/11 1004) Resp:  [14-20] 20 (02/11 0915) BP: (120-152)/(68-92) 136/71 (02/11 1305) SpO2:  [92 %-100 %] 99 % (02/11 0952) Weight:  [174 lb (78.9 kg)-190 lb (86.2 kg)] 174 lb (78.9 kg) (02/11 0952)  Hemodynamic parameters for last 24 hours:  sinus rhythm afebrile  Intake/Output from previous day: No intake/output data recorded. Intake/Output this shift: No intake/output data recorded.  Alert and comfortable Sternal incision clean and stable -healing Lungs clear Heart rhythm regular without rub or gallop Abdomen soft nontender Extremities warm, vein harvest site healing  Lab Results: Recent Labs    12/08/17 0441 12/08/17 0527 12/08/17 1025  WBC 10.2  --  10.0  HGB 11.0* 11.9* 10.4*  HCT 34.0* 35.0* 32.6*  PLT 513*  --  514*   BMET:  Recent Labs    12/08/17 0441 12/08/17 0527 12/08/17 1025  NA 137 138  --   K 3.9 3.8  --   CL 101 100*  --   CO2 21*  --   --   GLUCOSE 103* 94  --   BUN 6 5*  --   CREATININE 1.06* 1.00 1.02*  CALCIUM 9.6  --   --     PT/INR: No results for input(s): LABPROT, INR in the last 72 hours. ABG    Component Value Date/Time   PHART 7.343 (L) 11/13/2017 0537   HCO3 23.2 11/13/2017 0537   TCO2 26 12/08/2017 0527   ACIDBASEDEF 2.0 11/13/2017 0537   O2SAT 97.0 11/13/2017 0537   CBG (last 3)  No results for input(s): GLUCAP in the last  72 hours.  Assessment/Plan: S/P Procedure(s) (LRB): LEFT HEART CATH AND CORS/GRAFTS ANGIOGRAPHY (N/A) Resume cardiac rehabilitation  Phase I so patient will not lose conditioning   LOS: 0 days    Kathlee Nationseter Van Trigt III 12/08/2017

## 2017-12-08 NOTE — Progress Notes (Signed)
Lab called in critical troponin of 0.04, MD paged with results, pt asymptomatic, awake talking with her family, denies chest pain.

## 2017-12-08 NOTE — ED Triage Notes (Signed)
Pt from home via [facility]. C/o central CP that radiates to the R since last night w/ n/v, & back pain. Given 324mg  ASA, 4mg  Zofran & 1 NTG PTA by [facility]. Rates pain @ 3/10 @ this time. Hx of NSTEMI w/ bypass, HTN, high cholesterol & diabetes. A&O x4 on arrival.

## 2017-12-08 NOTE — Progress Notes (Signed)
Pt had chest pressure after stress test and Lexiscan medication, Smith InternationalLuke Kilroy. PA instructed RN to give 1 NTG; pain improved

## 2017-12-09 ENCOUNTER — Inpatient Hospital Stay (HOSPITAL_COMMUNITY)
Admission: EM | Disposition: A | Discharge status: Home / Self Care | Payer: Self-pay | Source: Home / Self Care | Attending: Cardiovascular Disease

## 2017-12-09 ENCOUNTER — Ambulatory Visit: Payer: BLUE CROSS/BLUE SHIELD | Admitting: Nurse Practitioner

## 2017-12-09 ENCOUNTER — Other Ambulatory Visit: Payer: Self-pay

## 2017-12-09 ENCOUNTER — Encounter (HOSPITAL_COMMUNITY): Payer: Self-pay | Admitting: Cardiology

## 2017-12-09 DIAGNOSIS — I10 Essential (primary) hypertension: Secondary | ICD-10-CM

## 2017-12-09 DIAGNOSIS — R0789 Other chest pain: Secondary | ICD-10-CM

## 2017-12-09 HISTORY — PX: LEFT HEART CATH AND CORS/GRAFTS ANGIOGRAPHY: CATH118250

## 2017-12-09 LAB — BASIC METABOLIC PANEL
Anion gap: 14 (ref 5–15)
BUN: 5 mg/dL — ABNORMAL LOW (ref 6–20)
CALCIUM: 9.2 mg/dL (ref 8.9–10.3)
CO2: 25 mmol/L (ref 22–32)
CREATININE: 0.99 mg/dL (ref 0.44–1.00)
Chloride: 100 mmol/L — ABNORMAL LOW (ref 101–111)
GFR calc non Af Amer: >60 mL/min (ref >60)
Glucose, Bld: 85 mg/dL (ref 65–99)
Potassium: 3.5 mmol/L (ref 3.5–5.1)
SODIUM: 139 mmol/L (ref 135–145)

## 2017-12-09 LAB — CBC
HCT: 32.2 % — ABNORMAL LOW (ref 36.0–46.0)
HEMOGLOBIN: 10.3 g/dL — AB (ref 12.0–15.0)
MCH: 29.5 pg (ref 26.0–34.0)
MCHC: 32 g/dL (ref 30.0–36.0)
MCV: 92.3 fL (ref 78.0–100.0)
PLATELETS: 440 10*3/uL — AB (ref 150–400)
RBC: 3.49 MIL/uL — AB (ref 3.87–5.11)
RDW: 13.2 % (ref 11.5–15.5)
WBC: 10.9 10*3/uL — AB (ref 4.0–10.5)

## 2017-12-09 LAB — PROTIME-INR
INR: 1.26
PROTHROMBIN TIME: 15.7 s — AB (ref 11.4–15.2)

## 2017-12-09 SURGERY — LEFT HEART CATH AND CORS/GRAFTS ANGIOGRAPHY
Anesthesia: LOCAL

## 2017-12-09 MED ORDER — SODIUM CHLORIDE 0.9 % WEIGHT BASED INFUSION
1.0000 mL/kg/h | INTRAVENOUS | Status: AC
  Administered 2017-12-09 (×2): 1 mL/kg/h via INTRAVENOUS

## 2017-12-09 MED ORDER — SODIUM CHLORIDE 0.9% FLUSH
3.0000 mL | Freq: Two times a day (BID) | INTRAVENOUS | Status: DC
  Administered 2017-12-09 – 2017-12-10 (×2): 3 mL via INTRAVENOUS

## 2017-12-09 MED ORDER — SODIUM CHLORIDE 0.9 % WEIGHT BASED INFUSION: 3.0000 mL/kg/h | INTRAVENOUS | Status: DC

## 2017-12-09 MED ORDER — HEPARIN (PORCINE) IN NACL 2-0.9 UNIT/ML-% IJ SOLN
INTRAMUSCULAR | Status: AC | PRN
  Administered 2017-12-09: 1000 mL

## 2017-12-09 MED ORDER — SODIUM CHLORIDE 0.9 % IV SOLN: 250.0000 mL | INTRAVENOUS | Status: DC | PRN

## 2017-12-09 MED ORDER — SODIUM CHLORIDE 0.9 % WEIGHT BASED INFUSION
1.0000 mL/kg/h | INTRAVENOUS | Status: DC
  Administered 2017-12-09: 1 mL/kg/h via INTRAVENOUS

## 2017-12-09 MED ORDER — HEPARIN SODIUM (PORCINE) 1000 UNIT/ML IJ SOLN
INTRAMUSCULAR | Status: DC | PRN
  Administered 2017-12-09: 4000 [IU] via INTRAVENOUS

## 2017-12-09 MED ORDER — SODIUM CHLORIDE 0.9% FLUSH: 3.0000 mL | INTRAVENOUS | Status: DC | PRN

## 2017-12-09 MED ORDER — LIDOCAINE HCL 1 % IJ SOLN
INTRAMUSCULAR | Status: AC
  Filled 2017-12-09: qty 20

## 2017-12-09 MED ORDER — VERAPAMIL HCL 2.5 MG/ML IV SOLN
INTRAVENOUS | Status: AC
  Filled 2017-12-09: qty 2

## 2017-12-09 MED ORDER — MIDAZOLAM HCL 2 MG/2ML IJ SOLN
INTRAMUSCULAR | Status: DC | PRN
  Administered 2017-12-09 (×2): 1 mg via INTRAVENOUS

## 2017-12-09 MED ORDER — IOPAMIDOL (ISOVUE-370) INJECTION 76%
INTRAVENOUS | Status: AC
  Filled 2017-12-09: qty 50

## 2017-12-09 MED ORDER — HEPARIN SODIUM (PORCINE) 1000 UNIT/ML IJ SOLN
INTRAMUSCULAR | Status: AC
  Filled 2017-12-09: qty 1

## 2017-12-09 MED ORDER — FENTANYL CITRATE (PF) 100 MCG/2ML IJ SOLN
INTRAMUSCULAR | Status: AC
  Filled 2017-12-09: qty 2

## 2017-12-09 MED ORDER — IOPAMIDOL (ISOVUE-370) INJECTION 76%
INTRAVENOUS | Status: DC | PRN
  Administered 2017-12-09: 70.3 mL via INTRA_ARTERIAL

## 2017-12-09 MED ORDER — IOPAMIDOL (ISOVUE-370) INJECTION 76%
INTRAVENOUS | Status: AC
  Filled 2017-12-09: qty 100

## 2017-12-09 MED ORDER — SODIUM CHLORIDE 0.9 % WEIGHT BASED INFUSION
3.0000 mL/kg/h | INTRAVENOUS | Status: DC
  Administered 2017-12-09: 3 mL/kg/h via INTRAVENOUS

## 2017-12-09 MED ORDER — VERAPAMIL HCL 2.5 MG/ML IV SOLN
INTRAVENOUS | Status: DC | PRN
  Administered 2017-12-09: 10 mL via INTRA_ARTERIAL

## 2017-12-09 MED ORDER — HEPARIN SODIUM (PORCINE) 5000 UNIT/ML IJ SOLN
5000.0000 [IU] | Freq: Three times a day (TID) | INTRAMUSCULAR | Status: DC
  Administered 2017-12-09 – 2017-12-10 (×2): 5000 [IU] via SUBCUTANEOUS
  Filled 2017-12-09 (×2): qty 1

## 2017-12-09 MED ORDER — HEPARIN (PORCINE) IN NACL 2-0.9 UNIT/ML-% IJ SOLN
INTRAMUSCULAR | Status: AC
  Filled 2017-12-09: qty 1000

## 2017-12-09 MED ORDER — MIDAZOLAM HCL 2 MG/2ML IJ SOLN
INTRAMUSCULAR | Status: AC
  Filled 2017-12-09: qty 2

## 2017-12-09 MED ORDER — FENTANYL CITRATE (PF) 100 MCG/2ML IJ SOLN
INTRAMUSCULAR | Status: DC | PRN
  Administered 2017-12-09 (×2): 25 ug via INTRAVENOUS

## 2017-12-09 MED ORDER — LIDOCAINE HCL (PF) 1 % IJ SOLN
INTRAMUSCULAR | Status: DC | PRN
  Administered 2017-12-09: 2 mL

## 2017-12-09 SURGICAL SUPPLY — 9 items
CATH INFINITI 5 FR RCB (CATHETERS) ×2 IMPLANT
CATH INFINITI 5FR MULTPACK ANG (CATHETERS) ×2 IMPLANT
DEVICE RAD COMP TR BAND LRG (VASCULAR PRODUCTS) ×2 IMPLANT
GLIDESHEATH SLEND SS 6F .021 (SHEATH) ×2 IMPLANT
GUIDEWIRE INQWIRE 1.5J.035X260 (WIRE) ×1 IMPLANT
INQWIRE 1.5J .035X260CM (WIRE) ×2
KIT PREMIUM HAND CONTROLLER (KITS) ×2 IMPLANT
KIT SINGLE USE MANIFOLD (KITS) ×2 IMPLANT
PACK CARDIAC CATHETERIZATION (CUSTOM PROCEDURE TRAY) ×2 IMPLANT

## 2017-12-09 NOTE — H&P (View-Only) (Signed)
Progress Note  Patient Name: Caitlyn Herrera Date of Encounter: 12/09/2017  Primary Cardiologist: Yvonne Kendall, MD   Subjective   Had recurrent chest pain for 20 minutes overnight.  She is currently chest pain free.   Inpatient Medications    Scheduled Meds: . aspirin  325 mg Oral Daily  . atorvastatin  80 mg Oral q1800  . heparin  5,000 Units Subcutaneous Q8H  . lisinopril  10 mg Oral Daily  . [START ON 12/10/2017] metFORMIN  500 mg Oral BID WC  . metoprolol tartrate  12.5 mg Oral BID  . pantoprazole  40 mg Oral Daily  . sodium chloride flush  3 mL Intravenous Q12H   Continuous Infusions: . sodium chloride    . sodium chloride 1 mL/kg/hr (12/09/17 0732)   PRN Meds: sodium chloride, acetaminophen, nitroGLYCERIN, ondansetron (ZOFRAN) IV, ondansetron, oxyCODONE, sodium chloride flush, traMADol   Vital Signs    Vitals:   12/08/17 1900 12/08/17 2253 12/09/17 0004 12/09/17 0552  BP: (!) 128/59 109/61 128/63 (!) 112/59  Pulse: 72 68 64 61  Resp:   18 18  Temp: 99.8 F (37.7 C)  97.7 F (36.5 C) 99.2 F (37.3 C)  TempSrc: Oral  Oral Oral  SpO2: 100%  100% 99%  Weight:    174 lb 6.1 oz (79.1 kg)  Height:        Intake/Output Summary (Last 24 hours) at 12/09/2017 0834 Last data filed at 12/09/2017 0004 Gross per 24 hour  Intake 240 ml  Output 500 ml  Net -260 ml   Filed Weights   12/08/17 0441 12/08/17 0952 12/09/17 0552  Weight: 190 lb (86.2 kg) 174 lb (78.9 kg) 174 lb 6.1 oz (79.1 kg)    Telemetry    Sinus rhythm.  No events.  - Personally Reviewed  ECG    n/a - Personally Reviewed  Physical Exam   VS:  BP (!) 112/59 (BP Location: Left Arm)   Pulse 61   Temp 99.2 F (37.3 C) (Oral)   Resp 18   Ht 5\' 6"  (1.676 m)   Wt 174 lb 6.1 oz (79.1 kg)   SpO2 99%   BMI 28.15 kg/m  , BMI Body mass index is 28.15 kg/m. GENERAL:  Well appearing HEENT: Pupils equal round and reactive, fundi not visualized, oral mucosa unremarkable NECK:  No jugular  venous distention, waveform within normal limits, carotid upstroke brisk and symmetric, no bruits, no thyromegaly CHEST: Well-healed sternotomy incision LUNGS:  Clear to auscultation bilaterally HEART:  RRR.  PMI not displaced or sustained,S1 and S2 within normal limits, no S3, no S4, no clicks, no rubs, no murmurs ABD:  Flat, positive bowel sounds normal in frequency in pitch, no bruits, no rebound, no guarding, no midline pulsatile mass, no hepatomegaly, no splenomegaly EXT:  2 plus pulses throughout, no edema, no cyanosis no clubbing SKIN:  No rashes no nodules NEURO:  Cranial nerves II through XII grossly intact, motor grossly intact throughout [facility]:  Cognitively intact, oriented to person place and time   Labs    Chemistry Recent Labs  Lab 12/08/17 0441 12/08/17 0527 12/08/17 1025 12/09/17 0446  NA 137 138  --  139  K 3.9 3.8  --  3.5  CL 101 100*  --  100*  CO2 21*  --   --  25  GLUCOSE 103* 94  --  85  BUN 6 5*  --  5*  CREATININE 1.06* 1.00 1.02* 0.99  CALCIUM 9.6  --   --  9.2  GFRNONAA 56*  --  59* >60  GFRAA >60  --  >60 >60  ANIONGAP 15  --   --  14     Hematology Recent Labs  Lab 12/08/17 0441 12/08/17 0527 12/08/17 1025 12/09/17 0446  WBC 10.2  --  10.0 10.9*  RBC 3.68*  --  3.54* 3.49*  HGB 11.0* 11.9* 10.4* 10.3*  HCT 34.0* 35.0* 32.6* 32.2*  MCV 92.4  --  92.1 92.3  MCH 29.9  --  29.4 29.5  MCHC 32.4  --  31.9 32.0  RDW 13.1  --  13.1 13.2  PLT 513*  --  514* 440*    Cardiac Enzymes Recent Labs  Lab 12/08/17 1025 12/08/17 1600 12/08/17 2242  TROPONINI <0.03 0.04* <0.03    Recent Labs  Lab 12/08/17 0453  TROPIPOC 0.00     BNPNo results for input(s): BNP, PROBNP in the last 168 hours.   DDimer No results for input(s): DDIMER in the last 168 hours.   Radiology    Dg Chest 2 View  Result Date: 12/08/2017 CLINICAL DATA:  Acute onset of mid chest pain. Recent heart surgery. EXAM: CHEST  2 VIEW COMPARISON:  Chest radiograph performed  11/15/2017 FINDINGS: A small left pleural effusion is noted. Left basilar airspace opacity likely reflects atelectasis. No pneumothorax is seen. The heart is normal in size. The patient is status post median sternotomy, with evidence of prior CABG. No acute osseous abnormalities are identified. IMPRESSION: Small left pleural effusion. Left basilar airspace opacity likely reflects atelectasis. Electronically Signed   By: Roanna Raider M.D.   On: 12/08/2017 05:14   Nm Myocar Multi W/spect W/wall Motion / Ef  Result Date: 12/08/2017  There was no ST segment deviation noted during stress.  No T wave inversion was noted during stress.  Defect 1: There is a defect present in the mid anterolateral and apex location, partially reversible, may represent a mild degree of ischemia in this distribution.  Nuclear stress EF: 71%. There was basal septal wall dyskinesis. (Possible post op CABG changes).  This is a intermediate risk study. Possible mild ischemia identified in the anterolateral distribution.  Donato Schultz, MD   Ct Angio Chest/abd/pel For Dissection W And/or Wo Contrast  Result Date: 12/08/2017 CLINICAL DATA:  57 y/o F; centralized chest pain radiating to the right with nausea, vomiting, and back pain. EXAM: CT ANGIOGRAPHY CHEST, ABDOMEN AND PELVIS TECHNIQUE: Multidetector CT imaging through the chest, abdomen and pelvis was performed using the standard protocol during bolus administration of intravenous contrast. Multiplanar reconstructed images and MIPs were obtained and reviewed to evaluate the vascular anatomy. CONTRAST:  ISOVUE-370 IOPAMIDOL (ISOVUE-370) INJECTION 76% COMPARISON:  11/07/2017 CT chest FINDINGS: CTA CHEST FINDINGS Cardiovascular: Normal heart size. No pericardial effusion. Status post CABG with LIMA and saphenous grafts. No central or lobar pulmonary embolus. Tiny outpouchings are present anterior to aortic arch (series 7, image 49) and of the right brachiocephalic artery at  origin of left common carotid artery (series 7, image 40) which may represent penetrating ulcers or ulcerated plaque. Normal caliber thoracic aorta.  No dissection flap. Mediastinum/Nodes: No enlarged mediastinal, hilar, or axillary lymph nodes. Thyroid gland, trachea, and esophagus demonstrate no significant findings. Lungs/Pleura: Small to moderate left pleural effusion. No consolidation or pneumothorax. Musculoskeletal: Post median sternotomy with intact wires. Mild anterior mediastinal edema is likely related to residual postsurgical changes. No fluid collection. Review of the MIP images confirms the above findings. CTA ABDOMEN AND PELVIS FINDINGS VASCULAR  Aorta: 2.5 cm infrarenal abdominal aorta. Mild calcific atherosclerosis. Celiac: Patent without evidence of aneurysm, dissection, vasculitis or significant stenosis. SMA: Proximal fibrofatty plaque with tandem segments of mild less than 50% stenosis. Renals: Moderate 50-70% stenosis of left renal artery origin. Patent right renal artery. IMA: Moderate to severe stenosis of IMA origin. Downstream vessel is patent. Inflow: Patent without evidence of aneurysm, dissection, vasculitis or significant stenosis. Veins: No obvious venous abnormality within the limitations of this arterial phase study. Review of the MIP images confirms the above findings. NON-VASCULAR Hepatobiliary: No focal liver abnormality is seen. Status post cholecystectomy. No biliary dilatation. Pancreas: Unremarkable. No pancreatic ductal dilatation or surrounding inflammatory changes. Spleen: Normal in size without focal abnormality. Adrenals/Urinary Tract: Adrenal glands are unremarkable. Kidneys are normal, without renal calculi, focal lesion, or hydronephrosis. Bladder is unremarkable. Stomach/Bowel: Stomach is within normal limits. Appendix appears normal. No evidence of bowel wall thickening, distention, or inflammatory changes. Lymphatic: No lymphadenopathy. Reproductive: Uterus and  bilateral adnexa are unremarkable. Other: No abdominal wall hernia or abnormality. No abdominopelvic ascites. Musculoskeletal: No acute or significant osseous findings. Review of the MIP images confirms the above findings. IMPRESSION: 1. Tiny anterior outpouchings of aortic arch and right brachiocephalic artery just upstream to left common carotid artery origin represent tiny penetrating ulcers or ulcerated plaque. 2. No aortic dissection or aneurysm. 3. Fibrofatty plaque of SMA with tandem segments of mild less than 50% stenosis. 4. Moderate 50-70% stenosis of left renal artery origin. 5. Small to moderate left pleural effusion. 6. Postsurgical changes related to median sternotomy and CABG with mild residual anterior mediastinal edema. No fluid collection. 7. 2.5 cm abdominal aortic ectasia. Ectatic abdominal aorta at risk for aneurysm development. Recommend followup by ultrasound in 5 years. This recommendation follows ACR consensus guidelines: White Paper of the ACR Incidental Findings Committee II on Vascular Findings. J Am Coll Radiol 2013; 10:789-794. Electronically Signed   By: Mitzi Hansen M.D.   On: 12/08/2017 06:22    Cardiac Studies   Echo 11/04/17 Study Conclusions  - Left ventricle: The cavity size was normal. Wall thickness was increased in a pattern of moderate LVH. Systolic function was vigorous. The estimated ejection fraction was in the range of 65% to 70%. Wall motion was normal; there were no regional wall motion abnormalities. Left ventricular diastolic function parameters were normal. - Right ventricle: The cavity size was normal. Systolic function was normal. - Tricuspid valve: There was mild regurgitation.  LEFT HEART CATH AND CORONARY ANGIOGRAPHY  Conclusion   Conclusions: 1. Severe, three-vessel coronary artery disease, as detailed below. 2. Upper normal left ventricular filling pressure.  Recommendations: 1. Transfer to tertiary care  center for cardiac surgery consultation for CABG. 2. Restart heparin infusion in 2 hours. 3. Aggressive secondary prevention, including high-intensity statin therapy.    Pre-CABG carotid and LE doppler Final Interpretation: Right Carotid: There is evidence in the right ICA of a 1-39% stenosis. Difficult        exam due to high bifrucation and tissue attenuation.  Left Carotid: There is evidence in the left ICA of a 1-39% stenosis. Difficult       exam due to high bifrucation and tissue attenuation. Vertebrals: Both vertebral arteries were patent with antegrade flow. Subclavians:  Right ABI: Resting right ankle-brachial index indicates moderate right lower extremity arterial disease. Left ABI: Resting left ankle-brachial index indicates moderate left lower extremity arterial disease.    Patient Profile     Ms. Kook is a 57 year old woman with CAD  status post recent NSTEMI and CABG, hyperlipidemia, diabetes, and prior tobacco abuse here with nausea, vomiting, and chest pain.  Assessment & Plan    # CAD s/p recent CABG:  # Atypical chest pain:  # Abnormal stress test:  # Hyperlipidemia: [name] presented with chest pain, nausea and vomiting.  Troponin elevated to 0.04 once but otherwise within normal limits.  There was suspicion for GI culprit so she had a [employer] Myoview.  Lexiscan Myoview was concerning for mild anterior ischemia.  Plan for LHC today.  Barium swallow also pending.  Continue aspirin, metoprolol and atorvastatin.  Pantoprazole started this admission.  # Hypertension: Controlled on metoprolol and lisinopril.  # Prior tobacco abuse: Patient was congratulated on quitting.     For questions or updates, please contact [facility] Please consult [email or web address] for contact info under Cardiology/STEMI.      Signed, Chilton Siiffany [place], MD  12/09/2017, 8:34 AM

## 2017-12-09 NOTE — Progress Notes (Signed)
SLP Cancellation Note  Patient Details Name: Caitlyn RobsonJoanna D Herrera MRN: [id number] DOB: [birth year]   Cancelled treatment:        Pt NPO for procedure when checked earlier today. Will continue efforts (may have to defer to tomorrow).   Royce MacadamiaLitaker, Maze Corniel Willis 12/09/2017, 3:39 PM  Breck CoonsLisa Willis Lonell FaceLitaker M.Ed [employer]-SLP Pager [phone]

## 2017-12-09 NOTE — Progress Notes (Signed)
Patient says the chest pain she's been having is at her incision. Patient also states she stopped smoking before her surgery.

## 2017-12-09 NOTE — Interval H&P Note (Signed)
History and Physical Interval Note:  12/09/2017 8:57 AM  Caitlyn Herrera  has presented today for surgery, with the diagnosis of abnormal nuc  The various methods of treatment have been discussed with the patient and family. After consideration of risks, benefits and other options for treatment, the patient has consented to  Procedure(s): LEFT HEART CATH AND CORS/GRAFTS ANGIOGRAPHY (N/A) as a surgical intervention .  The patient's history has been reviewed, patient examined, no change in status, stable for surgery.  I have reviewed the patient's chart and labs.  Questions were answered to the patient's satisfaction.    Cath Lab Visit (complete for each Cath Lab visit)  Clinical Evaluation Leading to the Procedure:   ACS: Yes.    Non-ACS:    Anginal Classification: CCS IV  Anti-ischemic medical therapy: Minimal Therapy (1 class of medications)  Non-Invasive Test Results: Intermediate-risk stress test findings: cardiac mortality 1-3%/year  Prior CABG: Previous CABG       Theron Aristaeter Uf Health JacksonvilleJordanMD,FACC 12/09/2017 8:57 AM

## 2017-12-09 NOTE — Progress Notes (Signed)
   12/09/17 1200  Clinical Encounter Type  Visited With Patient and family together  Visit Type Initial  Referral From Nurse  Consult/Referral To Chaplain  Spiritual Encounters  Spiritual Needs Literature   Responded to a SCC for information on a HCPA.  Family was present.  Patient indicated she had a Cath procedure this morning and may go home this afternoon.  Explained the form to the patient and answered her questions.  Patient did not want to complete at this time.  Will follow as needed. Chaplain Agustin CreeNewton Kuulei Kleier

## 2017-12-09 NOTE — Progress Notes (Signed)
R/t cath today, pt had Left radial TR band.  After 11cc air removal, tegaderm and gauze dressing placed (Level 0) without complication.

## 2017-12-09 NOTE — Plan of Care (Signed)
  Education: Knowledge of General Education information will improve 12/09/2017 0908 - Not Progressing by Loel LoftyLewis, Jorrell Kuster P, RN

## 2017-12-09 NOTE — Progress Notes (Signed)
Progress Note  Patient Name: Caitlyn Herrera Date of Encounter: 12/09/2017  Primary Cardiologist: Yvonne Kendall, MD   Subjective   Had recurrent chest pain for 20 minutes overnight.  She is currently chest pain free.   Inpatient Medications    Scheduled Meds: . aspirin  325 mg Oral Daily  . atorvastatin  80 mg Oral q1800  . heparin  5,000 Units Subcutaneous Q8H  . lisinopril  10 mg Oral Daily  . [START ON 12/10/2017] metFORMIN  500 mg Oral BID WC  . metoprolol tartrate  12.5 mg Oral BID  . pantoprazole  40 mg Oral Daily  . sodium chloride flush  3 mL Intravenous Q12H   Continuous Infusions: . sodium chloride    . sodium chloride 1 mL/kg/hr (12/09/17 0732)   PRN Meds: sodium chloride, acetaminophen, nitroGLYCERIN, ondansetron (ZOFRAN) IV, ondansetron, oxyCODONE, sodium chloride flush, traMADol   Vital Signs    Vitals:   12/08/17 1900 12/08/17 2253 12/09/17 0004 12/09/17 0552  BP: (!) 128/59 109/61 128/63 (!) 112/59  Pulse: 72 68 64 61  Resp:   18 18  Temp: 99.8 F (37.7 C)  97.7 F (36.5 C) 99.2 F (37.3 C)  TempSrc: Oral  Oral Oral  SpO2: 100%  100% 99%  Weight:    174 lb 6.1 oz (79.1 kg)  Height:        Intake/Output Summary (Last 24 hours) at 12/09/2017 0834 Last data filed at 12/09/2017 0004 Gross per 24 hour  Intake 240 ml  Output 500 ml  Net -260 ml   Filed Weights   12/08/17 0441 12/08/17 0952 12/09/17 0552  Weight: 190 lb (86.2 kg) 174 lb (78.9 kg) 174 lb 6.1 oz (79.1 kg)    Telemetry    Sinus rhythm.  No events.  - Personally Reviewed  ECG    n/a - Personally Reviewed  Physical Exam   VS:  BP (!) 112/59 (BP Location: Left Arm)   Pulse 61   Temp 99.2 F (37.3 C) (Oral)   Resp 18   Ht 5\' 6"  (1.676 m)   Wt 174 lb 6.1 oz (79.1 kg)   SpO2 99%   BMI 28.15 kg/m  , BMI Body mass index is 28.15 kg/m. GENERAL:  Well appearing HEENT: Pupils equal round and reactive, fundi not visualized, oral mucosa unremarkable NECK:  No jugular  venous distention, waveform within normal limits, carotid upstroke brisk and symmetric, no bruits, no thyromegaly CHEST: Well-healed sternotomy incision LUNGS:  Clear to auscultation bilaterally HEART:  RRR.  PMI not displaced or sustained,S1 and S2 within normal limits, no S3, no S4, no clicks, no rubs, no murmurs ABD:  Flat, positive bowel sounds normal in frequency in pitch, no bruits, no rebound, no guarding, no midline pulsatile mass, no hepatomegaly, no splenomegaly EXT:  2 plus pulses throughout, no edema, no cyanosis no clubbing SKIN:  No rashes no nodules NEURO:  Cranial nerves II through XII grossly intact, motor grossly intact throughout [facility]:  Cognitively intact, oriented to person place and time   Labs    Chemistry Recent Labs  Lab 12/08/17 0441 12/08/17 0527 12/08/17 1025 12/09/17 0446  NA 137 138  --  139  K 3.9 3.8  --  3.5  CL 101 100*  --  100*  CO2 21*  --   --  25  GLUCOSE 103* 94  --  85  BUN 6 5*  --  5*  CREATININE 1.06* 1.00 1.02* 0.99  CALCIUM 9.6  --   --  9.2  GFRNONAA 56*  --  59* >60  GFRAA >60  --  >60 >60  ANIONGAP 15  --   --  14     Hematology Recent Labs  Lab 12/08/17 0441 12/08/17 0527 12/08/17 1025 12/09/17 0446  WBC 10.2  --  10.0 10.9*  RBC 3.68*  --  3.54* 3.49*  HGB 11.0* 11.9* 10.4* 10.3*  HCT 34.0* 35.0* 32.6* 32.2*  MCV 92.4  --  92.1 92.3  MCH 29.9  --  29.4 29.5  MCHC 32.4  --  31.9 32.0  RDW 13.1  --  13.1 13.2  PLT 513*  --  514* 440*    Cardiac Enzymes Recent Labs  Lab 12/08/17 1025 12/08/17 1600 12/08/17 2242  TROPONINI <0.03 0.04* <0.03    Recent Labs  Lab 12/08/17 0453  TROPIPOC 0.00     BNPNo results for input(s): BNP, PROBNP in the last 168 hours.   DDimer No results for input(s): DDIMER in the last 168 hours.   Radiology    Dg Chest 2 View  Result Date: 12/08/2017 CLINICAL DATA:  Acute onset of mid chest pain. Recent heart surgery. EXAM: CHEST  2 VIEW COMPARISON:  Chest radiograph performed  11/15/2017 FINDINGS: A small left pleural effusion is noted. Left basilar airspace opacity likely reflects atelectasis. No pneumothorax is seen. The heart is normal in size. The patient is status post median sternotomy, with evidence of prior CABG. No acute osseous abnormalities are identified. IMPRESSION: Small left pleural effusion. Left basilar airspace opacity likely reflects atelectasis. Electronically Signed   By: Roanna Raider M.D.   On: 12/08/2017 05:14   Nm Myocar Multi W/spect W/wall Motion / Ef  Result Date: 12/08/2017  There was no ST segment deviation noted during stress.  No T wave inversion was noted during stress.  Defect 1: There is a defect present in the mid anterolateral and apex location, partially reversible, may represent a mild degree of ischemia in this distribution.  Nuclear stress EF: 71%. There was basal septal wall dyskinesis. (Possible post op CABG changes).  This is a intermediate risk study. Possible mild ischemia identified in the anterolateral distribution.  Donato Schultz, MD   Ct Angio Chest/abd/pel For Dissection W And/or Wo Contrast  Result Date: 12/08/2017 CLINICAL DATA:  57 y/o F; centralized chest pain radiating to the right with nausea, vomiting, and back pain. EXAM: CT ANGIOGRAPHY CHEST, ABDOMEN AND PELVIS TECHNIQUE: Multidetector CT imaging through the chest, abdomen and pelvis was performed using the standard protocol during bolus administration of intravenous contrast. Multiplanar reconstructed images and MIPs were obtained and reviewed to evaluate the vascular anatomy. CONTRAST:  ISOVUE-370 IOPAMIDOL (ISOVUE-370) INJECTION 76% COMPARISON:  11/07/2017 CT chest FINDINGS: CTA CHEST FINDINGS Cardiovascular: Normal heart size. No pericardial effusion. Status post CABG with LIMA and saphenous grafts. No central or lobar pulmonary embolus. Tiny outpouchings are present anterior to aortic arch (series 7, image 49) and of the right brachiocephalic artery at  origin of left common carotid artery (series 7, image 40) which may represent penetrating ulcers or ulcerated plaque. Normal caliber thoracic aorta.  No dissection flap. Mediastinum/Nodes: No enlarged mediastinal, hilar, or axillary lymph nodes. Thyroid gland, trachea, and esophagus demonstrate no significant findings. Lungs/Pleura: Small to moderate left pleural effusion. No consolidation or pneumothorax. Musculoskeletal: Post median sternotomy with intact wires. Mild anterior mediastinal edema is likely related to residual postsurgical changes. No fluid collection. Review of the MIP images confirms the above findings. CTA ABDOMEN AND PELVIS FINDINGS VASCULAR  Aorta: 2.5 cm infrarenal abdominal aorta. Mild calcific atherosclerosis. Celiac: Patent without evidence of aneurysm, dissection, vasculitis or significant stenosis. SMA: Proximal fibrofatty plaque with tandem segments of mild less than 50% stenosis. Renals: Moderate 50-70% stenosis of left renal artery origin. Patent right renal artery. IMA: Moderate to severe stenosis of IMA origin. Downstream vessel is patent. Inflow: Patent without evidence of aneurysm, dissection, vasculitis or significant stenosis. Veins: No obvious venous abnormality within the limitations of this arterial phase study. Review of the MIP images confirms the above findings. NON-VASCULAR Hepatobiliary: No focal liver abnormality is seen. Status post cholecystectomy. No biliary dilatation. Pancreas: Unremarkable. No pancreatic ductal dilatation or surrounding inflammatory changes. Spleen: Normal in size without focal abnormality. Adrenals/Urinary Tract: Adrenal glands are unremarkable. Kidneys are normal, without renal calculi, focal lesion, or hydronephrosis. Bladder is unremarkable. Stomach/Bowel: Stomach is within normal limits. Appendix appears normal. No evidence of bowel wall thickening, distention, or inflammatory changes. Lymphatic: No lymphadenopathy. Reproductive: Uterus and  bilateral adnexa are unremarkable. Other: No abdominal wall hernia or abnormality. No abdominopelvic ascites. Musculoskeletal: No acute or significant osseous findings. Review of the MIP images confirms the above findings. IMPRESSION: 1. Tiny anterior outpouchings of aortic arch and right brachiocephalic artery just upstream to left common carotid artery origin represent tiny penetrating ulcers or ulcerated plaque. 2. No aortic dissection or aneurysm. 3. Fibrofatty plaque of SMA with tandem segments of mild less than 50% stenosis. 4. Moderate 50-70% stenosis of left renal artery origin. 5. Small to moderate left pleural effusion. 6. Postsurgical changes related to median sternotomy and CABG with mild residual anterior mediastinal edema. No fluid collection. 7. 2.5 cm abdominal aortic ectasia. Ectatic abdominal aorta at risk for aneurysm development. Recommend followup by ultrasound in 5 years. This recommendation follows ACR consensus guidelines: White Paper of the ACR Incidental Findings Committee II on Vascular Findings. J Am Coll Radiol 2013; 10:789-794. Electronically Signed   By: Mitzi Hansen M.D.   On: 12/08/2017 06:22    Cardiac Studies   Echo 11/04/17 Study Conclusions  - Left ventricle: The cavity size was normal. Wall thickness was increased in a pattern of moderate LVH. Systolic function was vigorous. The estimated ejection fraction was in the range of 65% to 70%. Wall motion was normal; there were no regional wall motion abnormalities. Left ventricular diastolic function parameters were normal. - Right ventricle: The cavity size was normal. Systolic function was normal. - Tricuspid valve: There was mild regurgitation.  LEFT HEART CATH AND CORONARY ANGIOGRAPHY  Conclusion   Conclusions: 1. Severe, three-vessel coronary artery disease, as detailed below. 2. Upper normal left ventricular filling pressure.  Recommendations: 1. Transfer to tertiary care  center for cardiac surgery consultation for CABG. 2. Restart heparin infusion in 2 hours. 3. Aggressive secondary prevention, including high-intensity statin therapy.    Pre-CABG carotid and LE doppler Final Interpretation: Right Carotid: There is evidence in the right ICA of a 1-39% stenosis. Difficult        exam due to high bifrucation and tissue attenuation.  Left Carotid: There is evidence in the left ICA of a 1-39% stenosis. Difficult       exam due to high bifrucation and tissue attenuation. Vertebrals: Both vertebral arteries were patent with antegrade flow. Subclavians:  Right ABI: Resting right ankle-brachial index indicates moderate right lower extremity arterial disease. Left ABI: Resting left ankle-brachial index indicates moderate left lower extremity arterial disease.    Patient Profile     Ms. Kook is a 57 year old woman with CAD  status post recent NSTEMI and CABG, hyperlipidemia, diabetes, and prior tobacco abuse here with nausea, vomiting, and chest pain.  Assessment & Plan    # CAD s/p recent CABG:  # Atypical chest pain:  # Abnormal stress test:  # Hyperlipidemia: [name] presented with chest pain, nausea and vomiting.  Troponin elevated to 0.04 once but otherwise within normal limits.  There was suspicion for GI culprit so she had a [employer] Myoview.  Lexiscan Myoview was concerning for mild anterior ischemia.  Plan for LHC today.  Barium swallow also pending.  Continue aspirin, metoprolol and atorvastatin.  Pantoprazole started this admission.  # Hypertension: Controlled on metoprolol and lisinopril.  # Prior tobacco abuse: Patient was congratulated on quitting.     For questions or updates, please contact [facility] Please consult [email or web address] for contact info under Cardiology/STEMI.      Signed, Chilton Siiffany [place], MD  12/09/2017, 8:34 AM

## 2017-12-09 NOTE — Plan of Care (Signed)
  Health Behavior/Discharge Planning: Ability to manage health-related needs will improve 12/09/2017 0340 - Progressing by Jeanella Flatteryhomas, Nial Hawe T, RN   Clinical Measurements: Diagnostic test results will improve 12/09/2017 0340 - Progressing by Jeanella Flatteryhomas, Alquan Morrish T, RN   Clinical Measurements: Cardiovascular complication will be avoided 12/09/2017 0340 - Progressing by Jeanella Flatteryhomas, Bilal Manzer T, RN

## 2017-12-10 ENCOUNTER — Telehealth: Payer: Self-pay | Admitting: *Deleted

## 2017-12-10 ENCOUNTER — Inpatient Hospital Stay (HOSPITAL_COMMUNITY): Payer: BLUE CROSS/BLUE SHIELD

## 2017-12-10 MED ORDER — PANTOPRAZOLE SODIUM 40 MG PO TBEC: 40.0000 mg | DELAYED_RELEASE_TABLET | Freq: Every day | ORAL | 3 refills | Status: DC

## 2017-12-10 MED ORDER — METFORMIN HCL 500 MG PO TABS: 500.0000 mg | ORAL_TABLET | Freq: Two times a day (BID) | ORAL | 1 refills | Status: DC

## 2017-12-10 MED FILL — Lidocaine HCl Local Inj 1%: INTRAMUSCULAR | Qty: 20 | Status: AC

## 2017-12-10 MED FILL — Heparin Sodium (Porcine) 2 Unit/ML in Sodium Chloride 0.9%: INTRAMUSCULAR | Qty: 1000 | Status: AC

## 2017-12-10 NOTE — Progress Notes (Signed)
Patient is asking about getting her swallow study done, says she still has trouble swallowing at times.

## 2017-12-10 NOTE — Plan of Care (Signed)
  Education: Knowledge of General Education information will improve 12/10/2017 0207 - Adequate for Discharge by Jeanella Flatteryhomas, Shaunette Gassner T, RN   Health Behavior/Discharge Planning: Ability to manage health-related needs will improve 12/10/2017 0207 - Adequate for Discharge by Jeanella Flatteryhomas, Willmer Fellers T, RN   Clinical Measurements: Ability to maintain clinical measurements within normal limits will improve 12/10/2017 0207 - Adequate for Discharge by Jeanella Flatteryhomas, Kenna Seward T, RN

## 2017-12-10 NOTE — Progress Notes (Signed)
Modified Barium Swallow Progress Note  Patient Details  Name: Caitlyn Herrera MRN: [id number] Date of Birth: [birth year]  Today's Date: 12/10/2017  Modified Barium Swallow completed.  Full report located under Chart Review in the Imaging Section.  Brief recommendations include the following:  Clinical Impression  Oral and pharyngeal swallow were within normal limits with adequate oral manipulation, control and transit. Closure of laryngeal vestibule timely prior to bolus reaching the pyriform sinuses and epiglottic deflection appropriately sealing laryngeal preventing barium intrusion. No pharyngeal residue post swallow. Pt's cricopharyngeus muscle prominent however no cervical esophageal residue. Esophagus is not diagnosable during MBS although view of esophageal transit revealed hesitation of barium pill needing additional dry swallow and sips liquid to transit. Suspect she is experiencing referred sensation from possible esophagus versus any impairments s/p CABG. Counseled pt on strategies to minimize her esophageal symptoms. Would trial of meds for GER be beneficial or not? She might ask PCP (?). No follow up needed from ST perspective.    Swallow Evaluation Recommendations   Recommended Consults: Consider esophageal assessment   SLP Diet Recommendations: Regular solids;Thin liquid   Liquid Administration via: Cup;Straw   Medication Administration: Whole meds with liquid   Supervision: Patient able to self feed   Compensations: Follow solids with liquid   Postural Changes: Seated upright at 90 degrees   Oral Care Recommendations: Oral care BID        Royce MacadamiaLitaker, Indya Oliveria Willis 12/10/2017,10:39 AM   Breck CoonsLisa Willis Lonell FaceLitaker M.Ed [employer] Pager [phone]

## 2017-12-10 NOTE — Plan of Care (Signed)
  Education: Knowledge of General Education information will improve 12/10/2017 1006 - Progressing by Loel LoftyLewis, Kimball Appleby P, RN

## 2017-12-10 NOTE — Progress Notes (Signed)
Orders received for pt discharge.  Discharge summary printed and reviewed with pt.  Explained medication regimen, and pt had no further questions at this time.  IV removed and site remains clean, dry, intact.  Telemetry removed.  Pt in stable condition and awaiting transport. 

## 2017-12-10 NOTE — Telephone Encounter (Signed)
Patient currently admitted at this time. 

## 2017-12-10 NOTE — Discharge Summary (Signed)
Discharge Summary    Patient ID: Caitlyn Herrera,  MRN: [id number], DOB/AGE: May 21, 1961 57 y.o.  Admit date: 12/08/2017 Discharge date: 12/10/2017  Primary Care Provider: [name] Primary Cardiologist: Yvonne Kendall, MD  Discharge Diagnoses    Principal Problem:   Unstable angina [facility]) Active Problems:   Tobacco abuse   Dyslipidemia   3-vessel CAD   S/P CABG (coronary artery bypass graft)   Allergies No Known Allergies  Diagnostic Studies/Procedures    Echo 11/04/17 Study Conclusions  - Left ventricle: The cavity size was normal. Wall thickness was increased in a pattern of moderate LVH. Systolic function was vigorous. The estimated ejection fraction was in the range of 65% to 70%. Wall motion was normal; there were no regional wall motion abnormalities. Left ventricular diastolic function parameters were normal. - Right ventricle: The cavity size was normal. Systolic function was normal. - Tricuspid valve: There was mild regurgitation.  Pre-CABG carotid and LE doppler Final Interpretation: Right Carotid: There is evidence in the right ICA of a 1-39% stenosis. Difficult        exam due to high bifrucation and tissue attenuation.  Left Carotid: There is evidence in the left ICA of a 1-39% stenosis. Difficult       exam due to high bifrucation and tissue attenuation. Vertebrals: Both vertebral arteries were patent with antegrade flow. Subclavians:  Right ABI: Resting right ankle-brachial index indicates moderate right lower extremity arterial disease. Left ABI: Resting left ankle-brachial index indicates moderate left lower extremity arterial disease.  LHC 12/09/17:  Dist LM lesion is 30% stenosed.  Mid LAD lesion is 95% stenosed.  Lat 1st Mrg lesion is 90% stenosed.  Prox Cx to Mid Cx lesion is 80% stenosed.  2nd Mrg lesion is 80% stenosed.  Prox RCA lesion is 80% stenosed.  Mid RCA  lesion is 100% stenosed.  Dist RCA lesion is 90% stenosed.  LIMA graft was visualized by angiography and is normal in caliber.  The graft exhibits no disease.  SVG graft was visualized by angiography and is normal in caliber.  The graft exhibits no disease.  SVG graft was visualized by angiography and is very large.  The graft exhibits no disease.  LV end diastolic pressure is normal.  1. Severe 3 vessel occlusive CAD.  - 95% mid LAD - 90% first OM. This is a small caliber vessel and was not bypassed. - 80-85% anastomotic lesion at the insertion of the SVG into OM 2. - 100% mid RCA 2. Patent LIMA to the LAD 3. Patent SVG to OM 2 4. Patent SVG to PDA 5. Normal LVEDP.    History of Present Illness     Caitlyn Herrera is a 57 y.o. female with a history of HTN, HLD and recent admission for CABG presented for chest pressure evaluation.   She presented [facility] 11/04/17 with chest pain. Ruled in for NSTEMI. Cath showed 3 V disease s/p CABG x 3 on 11/12/17. Had post op anemia. Preop: PFTs revealed moderate COPD, mild bilateral carotid plaque and moderate ABI bilateral LE. The patient was discharged on amiodarone 200mg  qd. No documented atrial fibrillation.   Ms. Monts presented with 12 hours history of intermittent chest pain. She was doing well after discharge. However for the past one week, she had nausea, vomiting, diarrhea and food/tablet stock on throat. New after CABG. no fever or chills.  No sick contacts.  Since yesterday 6 PM, patient has intermittent chest pressure without associated  shortness of breath or radiation.  She had nausea.  Her pressure lasts for 15 minutes then resolved for a few minutes and recurs.  Similar to recent admission for non-STEMI.  Called EMS and given sublingual nitroglycerin x 1 and route.  Her pressure improved from 7 to 5 but  never resolved.  Her pain is worse while laying on the right side.  Feels better on left side.  She denies  orthopnea, PND, syncope, dizziness, lower extremity edema or melena.  She has quit smoking.  She feels her chest pressure coming from deep inside.  In the emergency room, electrolytes and serum creatinine were normal.  Point-of-care troponin normal.  Hemoglobin stable.  Chest x-ray showed small left pleural effusion.  EKG showed T wave elevation inferiorly and nonspecific biphasic T wave in lateral lead.  No significant changes compared to EKG of prior admission-personally reviewed.  CT angiogram of the chest/abdomen/pelvis IMPRESSION: 1. Tiny anterior outpouchings of aortic arch and right brachiocephalic artery just upstream to left common carotid artery origin represent tiny penetrating ulcers or ulcerated plaque. 2. No aortic dissection or aneurysm. 3. Fibrofatty plaque of SMA with tandem segments of mild less than 50% stenosis. 4. Moderate 50-70% stenosis of left renal artery origin. 5. Small to moderate left pleural effusion. 6. Postsurgical changes related to median sternotomy and CABG with mild residual anterior mediastinal edema. No fluid collection. 7. 2.5 cm abdominal aortic ectasia. Ectatic abdominal aorta at risk for aneurysm development. Recommend followup by ultrasound in 5 years. This recommendation follows ACR consensus guidelines: White Paper of the ACR Incidental Findings Committee II on Vascular Findings. J Am Coll Radiol 2013; 10:789-794.    Hospital Course     Consultants: TCTS  Patient was admitted to cardiology service and underwent a lexiscan myoview stress test. NM was concerning for mild anterior ischemia. She then underwent repeat heart catheterization, which showed patent grafts, no intervention.   TCTS was consulted. CT chest was reviewed and determined that her sternotomy was intact and healing well. She had no significant pericardial or pleural effusions. No intervention.  There was concern for a GI source for her atypical chest pain. She underwent  barium swallow test this morning that was normal.   Above testing was reassuring that this chest pain was not cardiac or upper GI in nature. She was discharged home today. She was started on PPI this admission.   _____________  Discharge Vitals Blood pressure 105/63, pulse 60, temperature 99.1 F (37.3 C), temperature source Oral, resp. rate 18, height 5\' 6"  (1.676 m), weight 175 lb 9.6 oz (79.7 kg), SpO2 99 %.  Filed Weights   12/08/17 0952 12/09/17 0552 12/10/17 0506  Weight: 174 lb (78.9 kg) 174 lb 6.1 oz (79.1 kg) 175 lb 9.6 oz (79.7 kg)    Labs & Radiologic Studies    CBC Recent Labs    12/08/17 1025 12/09/17 0446  WBC 10.0 10.9*  HGB 10.4* 10.3*  HCT 32.6* 32.2*  MCV 92.1 92.3  PLT 514* 440*   Basic Metabolic Panel Recent Labs    16/10/96 0441 12/08/17 0527 12/08/17 1025 12/09/17 0446  NA 137 138  --  139  K 3.9 3.8  --  3.5  CL 101 100*  --  100*  CO2 21*  --   --  25  GLUCOSE 103* 94  --  85  BUN 6 5*  --  5*  CREATININE 1.06* 1.00 1.02* 0.99  CALCIUM 9.6  --   --  9.2  Liver Function Tests No results for input(s): AST, ALT, ALKPHOS, BILITOT, PROT, ALBUMIN in the last 72 hours. No results for input(s): LIPASE, AMYLASE in the last 72 hours. Cardiac Enzymes Recent Labs    12/08/17 1025 12/08/17 1600 12/08/17 2242  TROPONINI <0.03 0.04* <0.03   BNP Invalid input(s): POCBNP D-Dimer No results for input(s): DDIMER in the last 72 hours. Hemoglobin A1C No results for input(s): HGBA1C in the last 72 hours. Fasting Lipid Panel No results for input(s): CHOL, HDL, LDLCALC, TRIG, CHOLHDL, LDLDIRECT in the last 72 hours. Thyroid Function Tests No results for input(s): TSH, T4TOTAL, T3FREE, THYROIDAB in the last 72 hours.  Invalid input(s): FREET3 _____________  Dg Chest 2 View  Result Date: 12/08/2017 CLINICAL DATA:  Acute onset of mid chest pain. Recent heart surgery. EXAM: CHEST  2 VIEW COMPARISON:  Chest radiograph performed 11/15/2017 FINDINGS: A  small left pleural effusion is noted. Left basilar airspace opacity likely reflects atelectasis. No pneumothorax is seen. The heart is normal in size. The patient is status post median sternotomy, with evidence of prior CABG. No acute osseous abnormalities are identified. IMPRESSION: Small left pleural effusion. Left basilar airspace opacity likely reflects atelectasis. Electronically Signed   By: Roanna Raider M.D.   On: 12/08/2017 05:14   Dg Chest 2 View  Result Date: 11/15/2017 CLINICAL DATA:  57 y/o  female status post CABG postop day 3. EXAM: CHEST  2 VIEW COMPARISON:  11/14/2017 and earlier. FINDINGS: PA and lateral chest radiographs. Left chest tube, mediastinal tube, and right IJ introducer sheath have been removed. No pneumothorax. Small bilateral pleural effusions. Patchy bilateral lower lung opacity. No pulmonary edema. Stable cardiac size and mediastinal contours. Sequelae of median sternotomy and CABG. Visualized tracheal air column is within normal limits. Epicardial pacer wires remain in place. Visible bowel gas pattern is within normal limits. IMPRESSION: 1. Epicardial pacer wires in place but other lines and tubes removed. 2. No pneumothorax. Small pleural effusions and lower lung atelectasis. Electronically Signed   By: Odessa Fleming M.D.   On: 11/15/2017 07:01   Dg Chest 2 View  Result Date: 11/11/2017 CLINICAL DATA:  Shortness of breath. Coronary artery disease and recent myocardial infarction. Pre-op respiratory exam for CABG. EXAM: CHEST  2 VIEW COMPARISON:  11/04/2017 FINDINGS: The heart size and mediastinal contours are within normal limits. Aortic atherosclerosis again noted. Both lungs are clear. The visualized skeletal structures are unremarkable. IMPRESSION: No active cardiopulmonary disease. Electronically Signed   By: Myles Rosenthal M.D.   On: 11/11/2017 07:46   Nm Myocar Multi W/spect W/wall Motion / Ef  Result Date: 12/08/2017  There was no ST segment deviation noted during  stress.  No T wave inversion was noted during stress.  Defect 1: There is a defect present in the mid anterolateral and apex location, partially reversible, may represent a mild degree of ischemia in this distribution.  Nuclear stress EF: 71%. There was basal septal wall dyskinesis. (Possible post op CABG changes).  This is a intermediate risk study. Possible mild ischemia identified in the anterolateral distribution.  Donato Schultz, MD   Dg Chest Port 1 View  Result Date: 11/14/2017 CLINICAL DATA:  History of CABG. EXAM: PORTABLE CHEST 1 VIEW COMPARISON:  11/13/2017. FINDINGS: Interval removal Swan-Ganz catheter. Right IJ sheath in stable position. Mediastinal drainage catheter stable position. Left chest tube noted in stable position. No definite pneumothorax on today's exam. Stable left pleural thickening noted. Prior CABG. Cardiomegaly with bilateral pulmonary interstitial prominence and right-sided pleural  effusion. Findings consistent congestive heart failure. Interval worsening from prior exam. IMPRESSION: 1. Interim removal of Swan-Ganz catheter. Right IJ sheath, mediastinal drainage catheter, left chest tube stable position. No pneumothorax. 2. Congestive heart failure with pulmonary interstitial edema and right-sided pleural effusion. Findings have progressed from prior exam. Electronically Signed   By: Maisie Fushomas  Register   On: 11/14/2017 09:40   Dg Chest Port 1 View  Result Date: 11/13/2017 CLINICAL DATA:  Status post CABG yesterday. EXAM: PORTABLE CHEST 1 VIEW COMPARISON:  Portable chest x-ray of November 12, 2017 FINDINGS: There has been interval extubation of the trachea and esophagus. The lungs remain well-expanded. There is no significant pleural effusion and there is no pneumothorax. The lung markings are coarse in the infrahilar regions bilaterally. The cardiac silhouette is top-normal in size. The pulmonary vascularity is not engorged. The Swan-Ganz catheter tip projects in the proximal  main pulmonary outflow tract. The mediastinal drain tip projects over the T4 vertebral body. The left chest tube tip projects over the medial aspect of the interspace between the left fifth and sixth posterior ribs. IMPRESSION: Interval extubation of the trachea and esophagus. No significant pulmonary edema. Subsegmental atelectasis in the infrahilar regions bilaterally. No pleural effusion or pneumothorax. The support tubes are in reasonable position. Electronically Signed   By: David  SwazilandJordan M.D.   On: 11/13/2017 09:31   Dg Chest Port 1 View  Result Date: 11/12/2017 CLINICAL DATA:  Status post CABG today. EXAM: PORTABLE CHEST 1 VIEW COMPARISON:  PA and lateral chest 11/11/2017. FINDINGS: Endotracheal tube is in place with the tip in good position at the level of the clavicular heads. OG tube side port is in good position in the stomach. Right IJ approach Swan-Ganz catheter tip is in the pulmonary outflow tract. Mediastinal drain and left chest tube are noted. No pneumothorax. Mild bibasilar atelectasis is seen. Heart size is normal. No edema. IMPRESSION: Tip of right IJ approach Swan-Ganz catheter is in the pulmonary outflow tract. Support tubes and lines are otherwise unremarkable. No pneumothorax. Mild bibasilar atelectasis. Electronically Signed   By: Drusilla Kannerhomas  Dalessio M.D.   On: 11/12/2017 15:29   Dg Swallowing Func-speech Pathology  Result Date: 12/10/2017 Objective Swallowing Evaluation: Type of Study: MBS-Modified Barium Swallow Study  Patient Details Name: Caitlyn RobsonJoanna D Karstens MRN: [id number] Date of Birth: 1961/10/04 Today's Date: 12/10/2017 Time: SLP Start Time (ACUTE ONLY): 0934 -SLP Stop Time (ACUTE ONLY): 0947 SLP Time Calculation (min) (ACUTE ONLY): 13 min Past Medical History: Past Medical History: Diagnosis Date . CAD (coronary artery disease)   a. NSTEMI 11/04/17; b. LHC dLM 30%, mLAD 90%, p-mLCx 80%, OM1 99%, OM2 30%, pRCA 80% mRCA 60%, dRCA 90% . Hyperlipidemia  . Hypertension  . NSTEMI (non-ST  elevated myocardial infarction) (HCC) 11/04/2017  a. TTE 11/04/2017: EF of 65-70%, no RWMA, normal LV diastolic function, normal RV cavity size and systolic function, mild TR . Pure hypercholesterolemia 11/07/2017 Past Surgical History: Past Surgical History: Procedure Laterality Date . BREAST CYST EXCISION Right  . CARDIAC CATHETERIZATION   . CORONARY ARTERY BYPASS GRAFT N/A 11/12/2017  Procedure: CORONARY ARTERY BYPASS GRAFTING (CABG) x Three , using left internal mammary artery and right leg greater saphenous vein harvested endoscopically;  Surgeon: [name], Peter, MD;  [facility];  Service: Open Heart Surgery;  Laterality: N/A; . DILATION AND CURETTAGE OF UTERUS   . LAPAROSCOPIC CHOLECYSTECTOMY   . LEFT HEART CATH AND CORONARY ANGIOGRAPHY N/A 11/04/2017  Procedure: LEFT HEART CATH AND CORONARY ANGIOGRAPHY;  Surgeon: End,  Cristal Deer, MD;  Location: [facility] INVASIVE CV LAB;  Service: Cardiovascular;  Laterality: N/A; . LEFT HEART CATH AND CORS/GRAFTS ANGIOGRAPHY N/A 12/09/2017  Procedure: LEFT HEART CATH AND CORS/GRAFTS ANGIOGRAPHY;  Surgeon: [name], Peter M, MD;  Location: [facility] INVASIVE CV LAB;  Service: Cardiovascular;  Laterality: N/A; . TEE WITHOUT CARDIOVERSION N/A 11/12/2017  Procedure: TRANSESOPHAGEAL ECHOCARDIOGRAM (TEE);  Surgeon: Donata Clay, Theron Arista, MD;  Location: [facility] OR;  Service: Open Heart Surgery;  Laterality: N/A; HPI: Ms.Dickersonpresented with 12 hours history of intermittent chest pain, nausea, vomiting, diarrhea and food/tablet stock on throat for past week (new after CABG 10/2017). CT chest small to moderate left pleural effusion. No consolidation or pneumothorax. PMH: NSTEMI, HTN, CAD, hyperlipidemia.  No Data Recorded Assessment / Plan / Recommendation CHL IP CLINICAL IMPRESSIONS 12/10/2017 Clinical Impression Oral and pharyngeal swallow were within normal limits with adequate oral manipulation, control and transit. Closure of laryngeal vestibule timely prior to bolus reaching the pyriform sinuses  and epiglottic deflection appropriately sealing laryngeal preventing barium intrusion. No pharyngeal residue post swallow. Pt's cricopharyngeus muscle prominent however no cervical esophageal residue. Esophagus is not diagnosable during MBS although view of esophageal transit revealed hesitation of barium pill needing additional dry swallow and sips liquid to transit. Suspect she is experiencing referred sensation from possible esophagus versus any impairments s/p CABG. Counseled pt on strategies to minimize her esophageal symptoms. Would trial of meds for GER be beneficial or not? She might ask PCP (?). No follow up needed from ST perspective.  SLP Visit Diagnosis Dysphagia, unspecified (R13.10) Attention and concentration deficit following -- Frontal lobe and executive function deficit following -- Impact on safety and function Mild aspiration risk   CHL IP TREATMENT RECOMMENDATION 12/10/2017 Treatment Recommendations No treatment recommended at this time   No flowsheet data found. CHL IP DIET RECOMMENDATION 12/10/2017 SLP Diet Recommendations Regular solids;Thin liquid Liquid Administration via Cup;Straw Medication Administration Whole meds with liquid Compensations Follow solids with liquid Postural Changes Seated upright at 90 degrees   CHL IP OTHER RECOMMENDATIONS 12/10/2017 Recommended Consults Consider esophageal assessment Oral Care Recommendations Oral care BID Other Recommendations --   CHL IP FOLLOW UP RECOMMENDATIONS 12/10/2017 Follow up Recommendations None   No flowsheet data found.     CHL IP ORAL PHASE 12/10/2017 Oral Phase WFL Oral - Pudding Teaspoon -- Oral - Pudding Cup -- Oral - Honey Teaspoon -- Oral - Honey Cup -- Oral - Nectar Teaspoon -- Oral - Nectar Cup -- Oral - Nectar Straw -- Oral - Thin Teaspoon -- Oral - Thin Cup -- Oral - Thin Straw -- Oral - Puree -- Oral - Mech Soft -- Oral - Regular -- Oral - Multi-Consistency -- Oral - Pill -- Oral Phase - Comment --  CHL IP PHARYNGEAL PHASE 12/10/2017  Pharyngeal Phase WFL Pharyngeal- Pudding Teaspoon -- Pharyngeal -- Pharyngeal- Pudding Cup -- Pharyngeal -- Pharyngeal- Honey Teaspoon -- Pharyngeal -- Pharyngeal- Honey Cup -- Pharyngeal -- Pharyngeal- Nectar Teaspoon -- Pharyngeal -- Pharyngeal- Nectar Cup -- Pharyngeal -- Pharyngeal- Nectar Straw -- Pharyngeal -- Pharyngeal- Thin Teaspoon -- Pharyngeal -- Pharyngeal- Thin Cup -- Pharyngeal -- Pharyngeal- Thin Straw -- Pharyngeal -- Pharyngeal- Puree -- Pharyngeal -- Pharyngeal- Mechanical Soft -- Pharyngeal -- Pharyngeal- Regular -- Pharyngeal -- Pharyngeal- Multi-consistency -- Pharyngeal -- Pharyngeal- Pill -- Pharyngeal -- Pharyngeal Comment --  CHL IP CERVICAL ESOPHAGEAL PHASE 12/10/2017 Cervical Esophageal Phase Impaired Pudding Teaspoon -- Pudding Cup -- Honey Teaspoon -- Honey Cup -- Nectar Teaspoon -- Nectar Cup -- Nectar Straw -- Thin Teaspoon --  Thin Cup -- Thin Straw Prominent cricopharyngeal segment Puree -- Mechanical Soft -- Regular -- Multi-consistency -- Pill -- Cervical Esophageal Comment -- No flowsheet data found. Caitlyn Herrera 12/10/2017, 10:39 AM  [name] Lonell Face.Ed CCC-SLP Pager [phone]             Ct Angio Chest/abd/pel For Dissection W And/or Wo Contrast  Result Date: 12/08/2017 CLINICAL DATA:  57 y/o F; centralized chest pain radiating to the right with nausea, vomiting, and back pain. EXAM: CT ANGIOGRAPHY CHEST, ABDOMEN AND PELVIS TECHNIQUE: Multidetector CT imaging through the chest, abdomen and pelvis was performed using the standard protocol during bolus administration of intravenous contrast. Multiplanar reconstructed images and MIPs were obtained and reviewed to evaluate the vascular anatomy. CONTRAST:  ISOVUE-370 IOPAMIDOL (ISOVUE-370) INJECTION 76% COMPARISON:  11/07/2017 CT chest FINDINGS: CTA CHEST FINDINGS Cardiovascular: Normal heart size. No pericardial effusion. Status post CABG with LIMA and saphenous grafts. No central or lobar pulmonary embolus. Tiny  outpouchings are present anterior to aortic arch (series 7, image 49) and of the right brachiocephalic artery at origin of left common carotid artery (series 7, image 40) which may represent penetrating ulcers or ulcerated plaque. Normal caliber thoracic aorta.  No dissection flap. Mediastinum/Nodes: No enlarged mediastinal, hilar, or axillary lymph nodes. Thyroid gland, trachea, and esophagus demonstrate no significant findings. Lungs/Pleura: Small to moderate left pleural effusion. No consolidation or pneumothorax. Musculoskeletal: Post median sternotomy with intact wires. Mild anterior mediastinal edema is likely related to residual postsurgical changes. No fluid collection. Review of the MIP images confirms the above findings. CTA ABDOMEN AND PELVIS FINDINGS VASCULAR Aorta: 2.5 cm infrarenal abdominal aorta. Mild calcific atherosclerosis. Celiac: Patent without evidence of aneurysm, dissection, vasculitis or significant stenosis. SMA: Proximal fibrofatty plaque with tandem segments of mild less than 50% stenosis. Renals: Moderate 50-70% stenosis of left renal artery origin. Patent right renal artery. IMA: Moderate to severe stenosis of IMA origin. Downstream vessel is patent. Inflow: Patent without evidence of aneurysm, dissection, vasculitis or significant stenosis. Veins: No obvious venous abnormality within the limitations of this arterial phase study. Review of the MIP images confirms the above findings. NON-VASCULAR Hepatobiliary: No focal liver abnormality is seen. Status post cholecystectomy. No biliary dilatation. Pancreas: Unremarkable. No pancreatic ductal dilatation or surrounding inflammatory changes. Spleen: Normal in size without focal abnormality. Adrenals/Urinary Tract: Adrenal glands are unremarkable. Kidneys are normal, without renal calculi, focal lesion, or hydronephrosis. Bladder is unremarkable. Stomach/Bowel: Stomach is within normal limits. Appendix appears normal. No evidence of bowel  wall thickening, distention, or inflammatory changes. Lymphatic: No lymphadenopathy. Reproductive: Uterus and bilateral adnexa are unremarkable. Other: No abdominal wall hernia or abnormality. No abdominopelvic ascites. Musculoskeletal: No acute or significant osseous findings. Review of the MIP images confirms the above findings. IMPRESSION: 1. Tiny anterior outpouchings of aortic arch and right brachiocephalic artery just upstream to left common carotid artery origin represent tiny penetrating ulcers or ulcerated plaque. 2. No aortic dissection or aneurysm. 3. Fibrofatty plaque of SMA with tandem segments of mild less than 50% stenosis. 4. Moderate 50-70% stenosis of left renal artery origin. 5. Small to moderate left pleural effusion. 6. Postsurgical changes related to median sternotomy and CABG with mild residual anterior mediastinal edema. No fluid collection. 7. 2.5 cm abdominal aortic ectasia. Ectatic abdominal aorta at risk for aneurysm development. Recommend followup by ultrasound in 5 years. This recommendation follows ACR consensus guidelines: White Paper of the ACR Incidental Findings Committee II on Vascular Findings. J Am Coll Radiol 2013; 10:789-794.  Electronically Signed   By: Mitzi Hansen M.D.   On: 12/08/2017 06:22   Disposition   Pt is being discharged home today in good condition.  Follow-up Plans & Appointments    Follow-up Information    Creig Hines, NP Follow up on 12/17/2017.   Specialties:  Nurse Practitioner, Cardiology, Radiology Why:  10:00 hospital follow up Contact information: [address] [place] [address] [phone]          Discharge Instructions    Diet - low sodium heart healthy   Complete by:  As directed    Increase activity slowly   Complete by:  As directed       Discharge Medications   Allergies as of 12/10/2017   No Known Allergies     Medication List    TAKE these medications   amiodarone  200 MG tablet Commonly known as:  PACERONE Take 1 tablet (200 mg total) by mouth daily.   aspirin 325 MG EC tablet Take 1 tablet (325 mg total) by mouth daily.   atorvastatin 80 MG tablet Commonly known as:  LIPITOR Take 1 tablet (80 mg total) by mouth daily at 6 PM.   lisinopril 10 MG tablet Commonly known as:  PRINIVIL,ZESTRIL Take 1 tablet (10 mg total) by mouth daily.   metFORMIN 500 MG tablet Commonly known as:  GLUCOPHAGE Take 1 tablet (500 mg total) by mouth 2 (two) times daily with a meal. Resume in 24 hrs. What changed:  additional instructions   metoprolol tartrate 25 MG tablet Commonly known as:  LOPRESSOR Take 0.5 tablets (12.5 mg total) by mouth 2 (two) times daily.   ondansetron 4 MG tablet Commonly known as:  ZOFRAN Take 1 tablet (4 mg total) by mouth every 8 (eight) hours as needed for nausea or vomiting.   oxyCODONE 5 MG immediate release tablet Commonly known as:  Oxy IR/ROXICODONE Take 1-2 tablets (5-10 mg total) by mouth every 6 (six) hours as needed for severe pain.   pantoprazole 40 MG tablet Commonly known as:  PROTONIX Take 1 tablet (40 mg total) by mouth daily. Start taking on:  12/11/2017   traMADol 50 MG tablet Commonly known as:  ULTRAM Take 1 tablet (50 mg total) by mouth every 6 (six) hours as needed.       Outstanding Labs/Studies   none  Duration of Discharge Encounter   Greater than 30 minutes including physician time.  Signed, Roe Rutherford Laquonda Welby PA-C 12/10/2017, 3:44 PM

## 2017-12-10 NOTE — Telephone Encounter (Signed)
-----   Message from Coralee RudSabrina F Gilley sent at 12/10/2017  3:39 PM EST ----- Regarding: tcm/ph 2/20 at 10am Nicolasa Duckinghristopher Berge, NP

## 2017-12-10 NOTE — Evaluation (Signed)
Clinical/Bedside Swallow Evaluation Patient Details  Name: Caitlyn Herrera MRN: [id number] Date of Birth: [birth year]  Today's Date: 12/10/2017 Time: SLP Start Time (ACUTE ONLY): [id number] SLP Stop Time (ACUTE ONLY): 0845 SLP Time Calculation (min) (ACUTE ONLY): 12 min  Past Medical History:  Past Medical History:  Diagnosis Date  . CAD (coronary artery disease)    a. NSTEMI 11/04/17; b. LHC dLM 30%, mLAD 90%, p-mLCx 80%, OM1 99%, OM2 30%, pRCA 80% mRCA 60%, dRCA 90%  . Hyperlipidemia   . Hypertension   . NSTEMI (non-ST elevated myocardial infarction) (HCC) 11/04/2017   a. TTE 11/04/2017: EF of 65-70%, no RWMA, normal LV diastolic function, normal RV cavity size and systolic function, mild TR  . Pure hypercholesterolemia 11/07/2017   Past Surgical History:  Past Surgical History:  Procedure Laterality Date  . BREAST CYST EXCISION Right   . CARDIAC CATHETERIZATION    . CORONARY ARTERY BYPASS GRAFT N/A 11/12/2017   Procedure: CORONARY ARTERY BYPASS GRAFTING (CABG) x Three , using left internal mammary artery and right leg greater saphenous vein harvested endoscopically;  Surgeon: [name], Peter, MD;  [facility];  Service: Open Heart Surgery;  Laterality: N/A;  . DILATION AND CURETTAGE OF UTERUS    . LAPAROSCOPIC CHOLECYSTECTOMY    . LEFT HEART CATH AND CORONARY ANGIOGRAPHY N/A 11/04/2017   Procedure: LEFT HEART CATH AND CORONARY ANGIOGRAPHY;  Surgeon: [name], Christopher, MD;  Location: [facility] INVASIVE CV LAB;  Service: Cardiovascular;  Laterality: N/A;  . LEFT HEART CATH AND CORS/GRAFTS ANGIOGRAPHY N/A 12/09/2017   Procedure: LEFT HEART CATH AND CORS/GRAFTS ANGIOGRAPHY;  Surgeon: SwazilandJordan, Peter M, MD;  [facility];  Service: Cardiovascular;  Laterality: N/A;  . TEE WITHOUT CARDIOVERSION N/A 11/12/2017   Procedure: TRANSESOPHAGEAL ECHOCARDIOGRAM (TEE);  Surgeon: [name], [name], MD;  [facility];  Service: Open Heart Surgery;  Laterality: N/A;   HPI:   Ms.Dickersonpresented with 12 hours history of intermittent chest pain, nausea, vomiting, diarrhea and food/tablet stock on throat for past week (new after CABG 10/2017). CT chest small to moderate left pleural effusion. No consolidation or pneumothorax. PMH: NSTEMI, HTN, CAD, hyperlipidemia.   Assessment / Plan / Recommendation Clinical Impression  Differential diagnosis of swallow function following bedside swallow assessment includes pharyngeal residue versus primary esophageal source. She denies prior esophageal concern; reports pharyngeal globus sensation began after CABG 10/2017. Eructation present, no s/s aspiration. Pt scheduled to be discharged this morning. MBS recommended to objectively evaluate and scheduled this morning at 9:30.  SLP Visit Diagnosis: Dysphagia, unspecified (R13.10)    Aspiration Risk  Mild aspiration risk    Diet Recommendation Regular;Thin liquid   Liquid Administration via: Cup;Straw Medication Administration: Whole meds with liquid Supervision: Patient able to self feed Postural Changes: Seated upright at 90 degrees    Other  Recommendations Oral Care Recommendations: Oral care BID   Follow up Recommendations None      Frequency and Duration            Prognosis        Swallow Study   General HPI: Ms.Dickersonpresented with 12 hours history of intermittent chest pain, nausea, vomiting, diarrhea and food/tablet stock on throat for past week (new after CABG 10/2017). CT chest small to moderate left pleural effusion. No consolidation or pneumothorax. PMH: NSTEMI, HTN, CAD, hyperlipidemia. Type of Study: Bedside Swallow Evaluation Previous Swallow Assessment: (none) Diet Prior to this Study: Regular;Thin liquids Temperature Spikes Noted: No Respiratory Status: Room air History of Recent Intubation: No  Behavior/Cognition: Alert;Cooperative;Pleasant mood Oral Cavity Assessment: Within Functional Limits Oral Care Completed by SLP: No Oral Cavity  - Dentition: Adequate natural dentition Vision: Functional for self-feeding Self-Feeding Abilities: Able to feed self Patient Positioning: Upright in bed Baseline Vocal Quality: Normal Volitional Cough: Strong Volitional Swallow: Able to elicit    Oral/Motor/Sensory Function Overall Oral Motor/Sensory Function: Within functional limits   Ice Chips Ice chips: Not tested   Thin Liquid Thin Liquid: Within functional limits Presentation: Cup;Straw    Nectar Thick Nectar Thick Liquid: Not tested   Honey Thick Honey Thick Liquid: Not tested   Puree Puree: Not tested   Solid   GO   Solid: Within functional limits        Caitlyn Herrera 12/10/2017,9:11 AM   Caitlyn Herrera [employer] [phone]

## 2017-12-10 NOTE — Progress Notes (Signed)
Progress Note  Patient Name: Caitlyn Herrera Date of Encounter: 12/10/2017  Primary Cardiologist: Yvonne Kendall, MD   Subjective   Feeling well.  She continues to have substernal chest pressure that is worse when eating.  She denies shortness of breath or palpitations.  Inpatient Medications    Scheduled Meds: . aspirin  325 mg Oral Daily  . atorvastatin  80 mg Oral q1800  . heparin  5,000 Units Subcutaneous Q8H  . lisinopril  10 mg Oral Daily  . metoprolol tartrate  12.5 mg Oral BID  . pantoprazole  40 mg Oral Daily  . sodium chloride flush  3 mL Intravenous Q12H   Continuous Infusions: . sodium chloride     PRN Meds: sodium chloride, acetaminophen, nitroGLYCERIN, ondansetron, oxyCODONE, sodium chloride flush, traMADol   Vital Signs    Vitals:   12/09/17 1442 12/09/17 2025 12/09/17 2149 12/10/17 0506  BP: 104/61 (!) 122/52 (!) 129/56 (!) 111/93  Pulse: 63 65 66   Resp: 18 18  20   Temp: 98.8 F (37.1 C) 98.9 F (37.2 C)  98.2 F (36.8 C)  TempSrc: Oral Oral  Oral  SpO2: 100% 100%  100%  Weight:    175 lb 9.6 oz (79.7 kg)  Height:        Intake/Output Summary (Last 24 hours) at 12/10/2017 0834 Last data filed at 12/10/2017 0817 Gross per 24 hour  Intake 1498.8 ml  Output 800 ml  Net 698.8 ml   Filed Weights   12/08/17 0952 12/09/17 0552 12/10/17 0506  Weight: 174 lb (78.9 kg) 174 lb 6.1 oz (79.1 kg) 175 lb 9.6 oz (79.7 kg)    Telemetry    Sinus rhythm.  6 beats NSVT - Personally Reviewed  ECG    n/a - Personally Reviewed  Physical Exam   VS:  BP (!) 111/93 (BP Location: Left Arm)   Pulse 66   Temp 98.2 F (36.8 C) (Oral)   Resp 20   Ht 5\' 6"  (1.676 m)   Wt 175 lb 9.6 oz (79.7 kg)   SpO2 100%   BMI 28.34 kg/m  , BMI Body mass index is 28.34 kg/m. GENERAL:  Well appearing.  No acute distress HEENT: Pupils equal round and reactive, fundi not visualized, oral mucosa unremarkable NECK:  No jugular venous distention, waveform within  normal limits, carotid upstroke brisk and symmetric, no bruits, no thyromegaly CHEST: Well-healed sternotomy incision LUNGS:  Clear to auscultation bilaterally HEART:  RRR.  PMI not displaced or sustained,S1 and S2 within normal limits, no S3, no S4, no clicks, no rubs, no murmurs ABD:  Flat, positive bowel sounds normal in frequency in pitch, no bruits, no rebound, no guarding, no midline pulsatile mass, no hepatomegaly, no splenomegaly EXT:  2 plus pulses throughout, no edema, no cyanosis no clubbing SKIN:  No rashes no nodules NEURO:  Cranial nerves II through XII grossly intact, motor grossly intact throughout [facility]:  Cognitively intact, oriented to person place and time   Labs    Chemistry Recent Labs  Lab 12/08/17 0441 12/08/17 0527 12/08/17 1025 12/09/17 0446  NA 137 138  --  139  K 3.9 3.8  --  3.5  CL 101 100*  --  100*  CO2 21*  --   --  25  GLUCOSE 103* 94  --  85  BUN 6 5*  --  5*  CREATININE 1.06* 1.00 1.02* 0.99  CALCIUM 9.6  --   --  9.2  GFRNONAA 56*  --  59* >60  GFRAA >60  --  >60 >60  ANIONGAP 15  --   --  14     Hematology Recent Labs  Lab 12/08/17 0441 12/08/17 0527 12/08/17 1025 12/09/17 0446  WBC 10.2  --  10.0 10.9*  RBC 3.68*  --  3.54* 3.49*  HGB 11.0* 11.9* 10.4* 10.3*  HCT 34.0* 35.0* 32.6* 32.2*  MCV 92.4  --  92.1 92.3  MCH 29.9  --  29.4 29.5  MCHC 32.4  --  31.9 32.0  RDW 13.1  --  13.1 13.2  PLT 513*  --  514* 440*    Cardiac Enzymes Recent Labs  Lab 12/08/17 1025 12/08/17 1600 12/08/17 2242  TROPONINI <0.03 0.04* <0.03    Recent Labs  Lab 12/08/17 0453  TROPIPOC 0.00     BNPNo results for input(s): BNP, PROBNP in the last 168 hours.   DDimer No results for input(s): DDIMER in the last 168 hours.   Radiology    Nm Myocar Multi W/spect W/wall Motion / Ef  Result Date: 12/08/2017  There was no ST segment deviation noted during stress.  No T wave inversion was noted during stress.  Defect 1: There is a defect  present in the mid anterolateral and apex location, partially reversible, may represent a mild degree of ischemia in this distribution.  Nuclear stress EF: 71%. There was basal septal wall dyskinesis. (Possible post op CABG changes).  This is a intermediate risk study. Possible mild ischemia identified in the anterolateral distribution.  Donato SchultzMark Skains, MD    Cardiac Studies   Echo 11/04/17 Study Conclusions  - Left ventricle: The cavity size was normal. Wall thickness was increased in a pattern of moderate LVH. Systolic function was vigorous. The estimated ejection fraction was in the range of 65% to 70%. Wall motion was normal; there were no regional wall motion abnormalities. Left ventricular diastolic function parameters were normal. - Right ventricle: The cavity size was normal. Systolic function was normal. - Tricuspid valve: There was mild regurgitation.  Pre-CABG carotid and LE doppler Final Interpretation: Right Carotid: There is evidence in the right ICA of a 1-39% stenosis. Difficult        exam due to high bifrucation and tissue attenuation.  Left Carotid: There is evidence in the left ICA of a 1-39% stenosis. Difficult       exam due to high bifrucation and tissue attenuation. Vertebrals: Both vertebral arteries were patent with antegrade flow. Subclavians:  Right ABI: Resting right ankle-brachial index indicates moderate right lower extremity arterial disease. Left ABI: Resting left ankle-brachial index indicates moderate left lower extremity arterial disease.  LHC 12/09/17:  Dist LM lesion is 30% stenosed.  Mid LAD lesion is 95% stenosed.  Lat 1st Mrg lesion is 90% stenosed.  Prox Cx to Mid Cx lesion is 80% stenosed.  2nd Mrg lesion is 80% stenosed.  Prox RCA lesion is 80% stenosed.  Mid RCA lesion is 100% stenosed.  Dist RCA lesion is 90% stenosed.  LIMA graft was visualized by angiography and is normal in caliber.  The  graft exhibits no disease.  SVG graft was visualized by angiography and is normal in caliber.  The graft exhibits no disease.  SVG graft was visualized by angiography and is very large.  The graft exhibits no disease.  LV end diastolic pressure is normal.   1. Severe 3 vessel occlusive CAD.     - 95% mid LAD    - 90% first OM. This is  a small caliber vessel and was not bypassed.    - 80-85% anastomotic lesion at the insertion of the SVG into OM 2.    - 100% mid RCA 2. Patent LIMA to the LAD 3. Patent SVG to OM 2 4. Patent SVG to PDA 5. Normal LVEDP.   Patient Profile     Caitlyn Herrera is a 57 year old woman with CAD status post recent NSTEMI and CABG, hyperlipidemia, diabetes, and prior tobacco abuse here with nausea, vomiting, and chest pain.  Assessment & Plan    # CAD s/p recent CABG:  # Atypical chest pain:  # Abnormal stress test:  # Hyperlipidemia: Caitlyn Herrera presented with chest pain, nausea and vomiting.  Troponin elevated to 0.04 once but otherwise within normal limits.  There was suspicion for GI culprit so she had a [employer].  Lexiscan Myoview was concerning for mild anterior ischemia.  LHC 12/09/17 showed that all grafts were patent.  She is scheduled for barium swallow this morning.  Continue aspirin, metoprolol and atorvastatin.  Pantoprazole started this admission.  # Hypertension: Controlled on metoprolol and lisinopril.  # Prior tobacco abuse: Patient was congratulated on quitting.   # Dispo: Plan for discharge later today.   For questions or updates, please contact [facility] Please consult [email or web address] for contact info under Cardiology/STEMI.      Signed, Chilton Si, MD  12/10/2017, 8:34 AM

## 2017-12-12 NOTE — Telephone Encounter (Signed)
Patient [facility] on 12/11/[facility].  Patient understands to follow up with provider Ward Givenshris Berge NP on 12/17/17 at 10:00AM [facility]. Patient understands discharge instructions? Yes Patient understands medications and regiment? Yes Patient understands to bring all medications to this visit? Yes  Patient confirmed appointment information with no further questions at this time.

## 2017-12-17 ENCOUNTER — Ambulatory Visit: Payer: BLUE CROSS/BLUE SHIELD | Admitting: Cardiothoracic Surgery

## 2017-12-17 ENCOUNTER — Encounter: Payer: Self-pay | Admitting: Nurse Practitioner

## 2017-12-17 ENCOUNTER — Ambulatory Visit (INDEPENDENT_AMBULATORY_CARE_PROVIDER_SITE_OTHER): Payer: BLUE CROSS/BLUE SHIELD | Admitting: Nurse Practitioner

## 2017-12-17 VITALS — BP 98/64 | HR 58 | Ht 67.0 in | Wt 176.5 lb

## 2017-12-17 DIAGNOSIS — I214 Non-ST elevation (NSTEMI) myocardial infarction: Secondary | ICD-10-CM | POA: Diagnosis not present

## 2017-12-17 DIAGNOSIS — I10 Essential (primary) hypertension: Secondary | ICD-10-CM | POA: Diagnosis not present

## 2017-12-17 DIAGNOSIS — E785 Hyperlipidemia, unspecified: Secondary | ICD-10-CM | POA: Diagnosis not present

## 2017-12-17 DIAGNOSIS — Z720 Tobacco use: Secondary | ICD-10-CM | POA: Diagnosis not present

## 2017-12-17 DIAGNOSIS — E119 Type 2 diabetes mellitus without complications: Secondary | ICD-10-CM

## 2017-12-17 DIAGNOSIS — I2510 Atherosclerotic heart disease of native coronary artery without angina pectoris: Secondary | ICD-10-CM

## 2017-12-17 MED ORDER — LISINOPRIL 10 MG PO TABS: 10.0000 mg | ORAL_TABLET | Freq: Every day | ORAL | 3 refills | Status: DC

## 2017-12-17 MED ORDER — ATORVASTATIN CALCIUM 80 MG PO TABS: 80.0000 mg | ORAL_TABLET | Freq: Every day | ORAL | 3 refills | Status: DC

## 2017-12-17 MED ORDER — ASPIRIN EC 325 MG PO TBEC: 325.0000 mg | DELAYED_RELEASE_TABLET | Freq: Every day | ORAL | 3 refills | Status: DC

## 2017-12-17 NOTE — Patient Instructions (Signed)
Medication Instructions:  Your physician has recommended you make the following change in your medication:  1- INCREASE Aspirin to 325 mg by mouth once a day. 2- Continue taking Amiodarone until it runs out and then you may stop.   Labwork: Your physician recommends that you return for lab work in: TODAY (LIPID, LIVER).   Testing/Procedures: none  Follow-Up: Your physician recommends that you schedule a follow-up appointment in: 2-3 MONTHS WITH DR END.   If you need a refill on your cardiac medications before your next appointment, please call your pharmacy.

## 2017-12-17 NOTE — Progress Notes (Signed)
Office Visit    Patient Name: Caitlyn Herrera Date of Encounter: 12/17/2017  Primary Care Provider:  [name] Primary Cardiologist:  Yvonne Kendall, MD  Chief Complaint    57 year old ? with a history of tobacco abuse, hypertension, hyperlipidemia, and newly diagnosed diabetes who recently suffered a non-STEMI in January 2019 requiring CABG x3, who presents for follow-up.  Past Medical History    Past Medical History:  Diagnosis Date  . Abdominal aortic ectasia (HCC)    a. 11/2017 CTA chest/abd/pelvis: 2.5 cm abd ao ectasia -->rec f/u u/s in 5 yrs.  [name] CAD (coronary artery disease)    a. 11/04/17 Cath: Native multivessel dzs-->CABG x 3 (LIMA->LAD, VG->OM2, VG->RPDA; b. 11/2017 MV: mid antlat/apical isch; c. 11/2017 Cath: LM 30d, LAD 18m, LCX 80p/m, OM1 90, OM2 80 (@ anastamosis of graft), RCA 80p, 113m, 90d, LIMA->LAD nl, VG->OM2 nl, VG->RPDA nl-->Med Rx.  . Carotid arterial disease (HCC)    a. 10/2017 Carotid U/S: 1-30% bilat ICA stenosis.  [name] History of echocardiogram    a. 11/04/2017 Echo: EF of 65-70%, no RWMA, nl LV diastolic fxn, nl RV size/fxn, mild TR.  [name] Hyperlipidemia   . Hypertension   . Left renal artery stenosis (HCC)    a. 11/2017 CTA Chest/Abd/Pelvis: 50-70% L RA stenosis.  . Pulmonary nodule, right    a. 10/2017 CT Chest: 5mm pulm nodule in R lung apex - rec f/u w/ non-contrast chest CT in 1 year.  . Pure hypercholesterolemia 11/07/2017  . Tobacco abuse   . Type II diabetes mellitus (HCC)    a. 10/2017 A1c 6.6.   Past Surgical History:  Procedure Laterality Date  . BREAST CYST EXCISION Right   . CARDIAC CATHETERIZATION    . CORONARY ARTERY BYPASS GRAFT N/A 11/12/2017   Procedure: CORONARY ARTERY BYPASS GRAFTING (CABG) x Three , using left internal mammary artery and right leg greater saphenous vein harvested endoscopically;  Surgeon: Kerin Perna, MD;  Location: [facility] OR;  Service: Open Heart Surgery;  Laterality: N/A;  . DILATION AND  CURETTAGE OF UTERUS    . LAPAROSCOPIC CHOLECYSTECTOMY    . LEFT HEART CATH AND CORONARY ANGIOGRAPHY N/A 11/04/2017   Procedure: LEFT HEART CATH AND CORONARY ANGIOGRAPHY;  Surgeon: Yvonne Kendall, MD;  Location: [facility] INVASIVE CV LAB;  Service: Cardiovascular;  Laterality: N/A;  . LEFT HEART CATH AND CORS/GRAFTS ANGIOGRAPHY N/A 12/09/2017   Procedure: LEFT HEART CATH AND CORS/GRAFTS ANGIOGRAPHY;  Surgeon: [name], Peter M, MD;  Location: [facility] INVASIVE CV LAB;  Service: Cardiovascular;  Laterality: N/A;  . TEE WITHOUT CARDIOVERSION N/A 11/12/2017   Procedure: TRANSESOPHAGEAL ECHOCARDIOGRAM (TEE);  Surgeon: Donata Clay, Theron Arista, MD;  Location: [facility] OR;  Service: Open Heart Surgery;  Laterality: N/A;    Allergies  No Known Allergies  History of Present Illness    57 y/o ? with the above past medical history including hypertension, hyperlipidemia, and tobacco abuse.  She was admitted to [facility] in January 2019 with a 2-week history of worsening exertional dyspnea and development of exertional chest pain.  She ruled in for non-STEMI, peaking her troponin at 1.91.  She was found to be diabetic with an A1c of 6.6.  She underwent diagnostic catheterization revealing severe multivessel CAD.  Echo showed normal LV function.  She was transferred to [facility] for thoracic surgical evaluation.  She subsequently underwent CABG x3 with a LIMA  LAD, vein graft  OM2, and vein graft  RPDA.  Postoperative course was relatively  uncomplicated and she was subsequently discharged.  Unfortunately, she represented on February 11, with complaints of a one-week history of nausea, vomiting, diarrhea, and intermittent chest pain and dyspnea.  Troponin was <0.03  0/04  <0.03.  CT Angie of the chest abdomen pelvis did not show any dissection or aneurysm.  She had incidental findings of left renal artery stenosis, 2.5 cm abdominal aortic ectasia, mild SMA disease, and tiny penetrating ulcers are ulcerated plaque involving the arch  and right brachiocephalic arteries.  She underwent stress testing which was intermediate risk, showing mid anterolateral and apical ischemia.  Catheterization was then performed showing 3 of 3 patent grafts with native multivessel disease including an 80% stenosis at the anastomosis of the vein graft to the second obtuse marginal.  This area was not felt to be amenable to PCI and also was not felt likely to be causing her symptoms.  She was subsequently discharged.  Since her discharge, she has done reasonably well.  She has not been having any significant angina though she does experience dyspnea on exertion which has been stable and slowly improving.  She does have some chest wall tenderness but this is also been improving.  Her medial right calf surgical scar is tender but she is not aware of any drainage or erythema.  She remains on a nicotine patch and has not smoked at all since her surgery.  She denies PND, orthopnea, dizziness, syncope, edema, or early satiety.  Home Medications    Prior to Admission medications   Medication Sig Start Date End Date Taking? Authorizing Provider  amiodarone (PACERONE) 200 MG tablet Take 1 tablet (200 mg total) by mouth daily. 11/17/17  Yes Gold, Glenice LaineWayne E, PA-C  aspirin EC 81 MG tablet Take 81 mg by mouth daily.   Yes [provider]  atorvastatin (LIPITOR) 80 MG tablet Take 1 tablet (80 mg total) by mouth daily at 6 PM. 11/25/17  Yes [name], [name], MD  citalopram (CELEXA) 20 MG tablet Take 10 mg by mouth daily.   Yes [provider]  lisinopril (PRINIVIL,ZESTRIL) 10 MG tablet Take 1 tablet (10 mg total) by mouth daily. 11/17/17  Yes Gold, Wayne E, PA-C  metFORMIN (GLUCOPHAGE) 500 MG tablet Take 1 tablet (500 mg total) by mouth 2 (two) times daily with a meal. Resume in 24 hrs. 12/10/17  Yes Duke, Roe RutherfordAngela Nicole, PA  metoprolol tartrate (LOPRESSOR) 25 MG tablet Take 0.5 tablets (12.5 mg total) by mouth 2 (two) times daily. Patient taking  differently: Take 25 mg by mouth 2 (two) times daily.  11/17/17  Yes Gold, Wayne E, PA-C  mometasone-formoterol (DULERA) 200-5 MCG/ACT AERO Inhale 2 puffs into the lungs 2 (two) times daily.   Yes [provider]  nicotine (NICODERM CQ - DOSED IN MG/24 HOURS) 21 mg/24hr patch Place 21 mg onto the skin as directed.   Yes [provider]  ondansetron (ZOFRAN) 4 MG tablet Take 1 tablet (4 mg total) by mouth every 8 (eight) hours as needed for nausea or vomiting. 11/28/17  Yes [name], Peter, MD  oxyCODONE (OXY IR/ROXICODONE) 5 MG immediate release tablet Take 1-2 tablets (5-10 mg total) by mouth every 6 (six) hours as needed for severe pain. 11/17/17  Yes Gold, Wayne E, PA-C  pantoprazole (PROTONIX) 40 MG tablet Take 1 tablet (40 mg total) by mouth daily. 12/11/17  Yes Duke, Roe RutherfordAngela Nicole, PA    Review of Systems    Mild to moderate chest wall pain which she is  now using Tylenol for.  Soreness of the right medial calf incision.  Mild dyspnea on exertion.  She denies palpitations, PND, orthopnea, dizziness, syncope, edema, or early satiety.  All other systems reviewed and are otherwise negative except as noted above.  Physical Exam    VS:  BP 98/64 (BP Location: Left Arm, Patient Position: Sitting, Cuff Size: Normal)   Pulse (!) 58   Ht 5\' 7"  (1.702 m)   Wt 176 lb 8 oz (80.1 kg)   BMI 27.64 kg/m  , BMI Body mass index is 27.64 kg/m. GEN: Well nourished, well developed, in no acute distress.  HEENT: normal.  Neck: Supple, no JVD, carotid bruits, or masses. Cardiac: RRR, no murmurs, rubs, or gallops. No clubbing, cyanosis, edema.  Radials/DP/PT 2+ and equal bilaterally.  Midsternal and right medial lower leg surgical incisions are healing well without erythema or drainage. Respiratory:  Respirations regular and unlabored, clear to auscultation bilaterally. GI: Soft, nontender, nondistended, BS + x 4. MS: no deformity or atrophy. Skin: warm and dry, no rash. Neuro:  Strength and  sensation are intact. Psych: Normal affect.  Accessory Clinical Findings    ECG -sinus bradycardia, 58, leftward axis, nonspecific ST and T changes.  Assessment & Plan    1.  Non-ST segment elevation myocardial infarction, subsequent episode of care/CAD status post CABG: Patient was admitted to [facility] in January with chest pain, dyspnea, and ruled in for non-STEMI.  Cath revealed multivessel disease and she subsequently underwent CABG x3.  She was readmitted last week with nausea, vomiting, and chest pain.  She had an intermediate stress test followed by catheterization which showed 3 of 3 patent grafts.  She does have anastomotic disease within the second obtuse marginal however the areas not felt to be amenable to PCI.  It was felt that stress test findings might be secondary to first obtuse marginal disease.  Medical therapy was recommended.  Since hospitalization, she has done relatively well.  She has had some dyspnea but this does seem to be improving.  She is interested in cardiac rehabilitation and does plan on enrolling.  She has not been having any significant angina and has only mild chest wall pain at this point.  Surgical wounds are healing well.  She is euvolemic on exam.  She remains on aspirin, (increase back to 325 status post CABG), statin, beta-blocker, and ACE inhibitor therapy.  She is fasting today and I will follow-up lipids and LFTs.  She has follow-up with CT surgery next week.  She currently has a prescription for amiodarone which was initiated prophylactically.  She denies any palpitations and she will stop this when she runs out next week.  2.  Essential hypertension: Blood pressure was elevated when she saw her PCP yesterday and her Toprol was increased.  Blood pressure is softer today.  She does have a cuff at home and says she typically runs in the 140s.  I recommend she continue to follow this at home and contact us if she has lower blood pressures, at which time  we can scale back on her beta-blocker.  3.  Hyperlipidemia: LDL was markedly elevated at 172 in January.  Follow-up lipids and LFTs as she is fasting today.  Continue high potency statin therapy.  4.  Tobacco abuse: She quit smoking following her hospitalization.  I congratulated her on this.  She remains on nicotine patch.  We did discuss how individuals typically will wean off of nicotine patches over a 10-week period.  5.  Type 2 diabetes mellitus: Started on metformin in the setting of an A1c of 6.6 during her hospitalization.  She is tolerating this well and has followed up with primary care.  6.  Right pulmonary nodule: Incidentally noted on CT in January.  With smoking history, she will need follow-up noncontrast CT January 2020.  7.  Disposition: Follow-up lipids and LFTs today.  She has follow-up with CT surgery next week.  Plan to follow-up in cardiology clinic in the next 2-3 months.  She will enroll in cardiac rehab.   Nicolasa Ducking, NP 12/17/2017, 11:01 AM

## 2017-12-18 ENCOUNTER — Other Ambulatory Visit: Payer: Self-pay | Admitting: *Deleted

## 2017-12-18 ENCOUNTER — Other Ambulatory Visit: Payer: Self-pay | Admitting: Cardiothoracic Surgery

## 2017-12-18 DIAGNOSIS — Z951 Presence of aortocoronary bypass graft: Secondary | ICD-10-CM

## 2017-12-18 DIAGNOSIS — Z79899 Other long term (current) drug therapy: Secondary | ICD-10-CM

## 2017-12-18 DIAGNOSIS — R748 Abnormal levels of other serum enzymes: Secondary | ICD-10-CM

## 2017-12-18 LAB — HEPATIC FUNCTION PANEL
ALBUMIN: 4.1 g/dL (ref 3.5–5.5)
ALT: 15 IU/L (ref 0–32)
AST: 15 IU/L (ref 0–40)
Alkaline Phosphatase: 156 IU/L — ABNORMAL HIGH (ref 39–117)
BILIRUBIN TOTAL: 0.5 mg/dL (ref 0.0–1.2)
Bilirubin, Direct: 0.13 mg/dL (ref 0.00–0.40)
Total Protein: 7.4 g/dL (ref 6.0–8.5)

## 2017-12-18 LAB — LIPID PANEL
CHOL/HDL RATIO: 3.3 ratio (ref 0.0–4.4)
Cholesterol, Total: 103 mg/dL (ref 100–199)
HDL: 31 mg/dL — AB (ref >39)
LDL Calculated: 56 mg/dL (ref 0–99)
Triglycerides: 79 mg/dL (ref 0–149)
VLDL CHOLESTEROL CAL: 16 mg/dL (ref 5–40)

## 2017-12-22 ENCOUNTER — Ambulatory Visit: Payer: Self-pay

## 2017-12-23 ENCOUNTER — Ambulatory Visit
Admission: RE | Admit: 2017-12-23 | Discharge: 2017-12-23 | Disposition: A | Discharge status: Home / Self Care | Payer: BLUE CROSS/BLUE SHIELD | Source: Ambulatory Visit | Attending: Cardiothoracic Surgery | Admitting: Cardiothoracic Surgery

## 2017-12-23 ENCOUNTER — Other Ambulatory Visit: Payer: Self-pay

## 2017-12-23 ENCOUNTER — Ambulatory Visit (INDEPENDENT_AMBULATORY_CARE_PROVIDER_SITE_OTHER): Payer: Self-pay | Admitting: Surgical

## 2017-12-23 VITALS — BP 136/86 | HR 67 | Resp 18 | Ht 67.0 in | Wt 175.8 lb

## 2017-12-23 DIAGNOSIS — Z951 Presence of aortocoronary bypass graft: Secondary | ICD-10-CM

## 2017-12-23 DIAGNOSIS — I2510 Atherosclerotic heart disease of native coronary artery without angina pectoris: Secondary | ICD-10-CM

## 2017-12-23 NOTE — Progress Notes (Signed)
[address]       [place] [address]             [phone]           [address] [place] [place] [address]       [place] [address]             [phone]      LUSIA GREIS [facility] Medical Record #[id number] Date of Birth: 1961-05-04  Referring: Yvonne Kendall, MD Primary Care: [name] Primary Cardiologist: Yvonne Kendall, MD   Chief Complaint:   POST OP FOLLOW UP  PATIENT:Caitlyn D Dickerson57 y.o.female  PRE-OPERATIVE DIAGNOSIS: CAD  POST-OPERATIVE DIAGNOSIS: CAD  PROCEDURE:Procedure(s): CORONARY ARTERY BYPASS GRAFTING (CABG) x 3, using left internal mammary artery and right leg greater saphenous vein harvested endoscopically (N/A) TRANSESOPHAGEAL ECHOCARDIOGRAM (TEE) (N/A)  SURGEON: Surgeon(s) and Role: Kerin Perna, MD - Primary  PHYSICIAN ASSISTANT:Telly Broberg PA-C  ANESTHESIA:general   History of Present Illness:    Patient is a 57 year old female status post the above described procedure.  She is seen in the office on routine follow-up appointment on today's date.  Currently she describes her recovery is being somewhat slow.  Her ambulation is slowly improving.  She does continue to have some sternal incisional discomfort.  She takes Tylenol for this.  She has not been able to drive she says because of soreness but had not been cleared for driving as of yet.  She has a nonproductive cough but denies significant shortness of breath.  She did equivalents.  She denies fevers, chills or other constitutional symptoms.  She is having some difficulty with sleeping due to sternal discomfort.      Past Medical History:  Diagnosis Date  . Abdominal aortic ectasia (HCC)    a. 11/2017 CTA chest/abd/pelvis: 2.5 cm abd ao ectasia -->rec f/u u/s in 5 yrs.  [name] CAD (coronary artery disease)    a. 11/04/17 Cath: Native multivessel dzs-->CABG x 3 (LIMA->LAD, VG->OM2, VG->RPDA; b. 11/2017 MV:  mid antlat/apical isch; c. 11/2017 Cath: LM 30d, LAD 85m, LCX 80p/m, OM1 90, OM2 80 (@ anastamosis of graft), RCA 80p, 161m, 90d, LIMA->LAD nl, VG->OM2 nl, VG->RPDA nl-->Med Rx.  . Carotid arterial disease (HCC)    a. 10/2017 Carotid U/S: 1-30% bilat ICA stenosis.  [name] History of echocardiogram    a. 11/04/2017 Echo: EF of 65-70%, no RWMA, nl LV diastolic fxn, nl RV size/fxn, mild TR.  [name] Hyperlipidemia   . Hypertension   . Left renal artery stenosis (HCC)    a. 11/2017 CTA Chest/Abd/Pelvis: 50-70% L RA stenosis.  . Pulmonary nodule, right    a. 10/2017 CT Chest: 5mm pulm nodule in R lung apex - rec f/u w/ non-contrast chest CT in 1 year.  . Pure hypercholesterolemia 11/07/2017  . Tobacco abuse   . Type II diabetes mellitus (HCC)    a. 10/2017 A1c 6.6.     Social History   Tobacco Use  Smoking Status Former Smoker  . Packs/day: 1.00  . Years: 42.00  . Pack years: 42.00  . Types: Cigarettes  . Last attempt to quit: 11/01/2017  . Years since quitting: 0.1  Smokeless Tobacco Never Used  Tobacco Comment   11/05/2017 "stopped smoking 11/01/2017"    Social History   Substance and Sexual Activity  Alcohol Use No     No Known Allergies  Current Outpatient Medications  Medication Sig Dispense Refill  . amiodarone (PACERONE)  200 MG tablet Take 1 tablet (200 mg total) by mouth daily. 30 tablet 1  . aspirin EC 325 MG tablet Take 1 tablet (325 mg total) by mouth daily. 90 tablet 3  . atorvastatin (LIPITOR) 80 MG tablet Take 1 tablet (80 mg total) by mouth daily at 6 PM. 30 tablet 3  . citalopram (CELEXA) 20 MG tablet Take 10 mg by mouth daily.    [name] lisinopril (PRINIVIL,ZESTRIL) 10 MG tablet Take 1 tablet (10 mg total) by mouth daily. 90 tablet 3  . metFORMIN (GLUCOPHAGE) 500 MG tablet Take 1 tablet (500 mg total) by mouth 2 (two) times daily with a meal. Resume in 24 hrs. 60 tablet 1  . metoprolol tartrate (LOPRESSOR) 25 MG tablet Take 0.5 tablets (12.5 mg total) by mouth 2 (two) times daily.  (Patient taking differently: Take 25 mg by mouth 2 (two) times daily. ) 30 tablet 1  . mometasone-formoterol (DULERA) 200-5 MCG/ACT AERO Inhale 2 puffs into the lungs 2 (two) times daily.    . nicotine (NICODERM CQ - DOSED IN MG/24 HOURS) 21 mg/24hr patch Place 21 mg onto the skin as directed.    . ondansetron (ZOFRAN) 4 MG tablet Take 1 tablet (4 mg total) by mouth every 8 (eight) hours as needed for nausea or vomiting. 20 tablet 0  . oxyCODONE (OXY IR/ROXICODONE) 5 MG immediate release tablet Take 1-2 tablets (5-10 mg total) by mouth every 6 (six) hours as needed for severe pain. 30 tablet 0  . pantoprazole (PROTONIX) 40 MG tablet Take 1 tablet (40 mg total) by mouth daily. 30 tablet 3   No current facility-administered medications for this visit.        Physical Exam: BP 136/86 (BP Location: Left Arm, Patient Position: Sitting, Cuff Size: Large)   Pulse 67   Resp 18   Ht 5\' 7"  (1.702 m)   Wt 175 lb 12.8 oz (79.7 kg)   SpO2 98% Comment: RA  BMI 27.53 kg/m   General appearance: alert, cooperative and no distress Heart: regular rate and rhythm Lungs: Slightly diminished in the left base Abdomen: soft, non-tender; bowel sounds normal; no masses,  no organomegaly Extremities: No edema Wound: Incisions well-healed without evidence of infection.   Diagnostic Studies & Laboratory data:     Recent Radiology Findings:   Dg Chest 2 View  Result Date: 12/23/2017 CLINICAL DATA:  Chest pain.  Recent coronary artery bypass grafting EXAM: CHEST  2 VIEW COMPARISON:  December 08, 2017 FINDINGS: There is a persistent fairly small left pleural effusion with left base atelectasis. Lungs elsewhere clear. Heart size and pulmonary vascularity are normal. No adenopathy. There is aortic atherosclerosis. No pneumothorax. No bone lesions. Patient is status post coronary artery bypass grafting. IMPRESSION: Persistent small left pleural effusion with left base atelectasis. Lungs elsewhere clear. Cardiac  silhouette within normal limits. There is aortic atherosclerosis. Aortic Atherosclerosis (ICD10-I70.0). Electronically Signed   By: Bretta Bang III M.D.   On: 12/23/2017 14:14      Recent Lab Findings: Lab Results  Component Value Date   WBC 10.9 (H) 12/09/2017   HGB 10.3 (L) 12/09/2017   HCT 32.2 (L) 12/09/2017   PLT 440 (H) 12/09/2017   GLUCOSE 85 12/09/2017   CHOL 103 12/17/2017   TRIG 79 12/17/2017   HDL 31 (L) 12/17/2017   LDLCALC 56 12/17/2017   ALT 15 12/17/2017   AST 15 12/17/2017   NA 139 12/09/2017   K 3.5 12/09/2017   CL 100 (L) 12/09/2017  CREATININE 0.99 12/09/2017   BUN 5 (L) 12/09/2017   CO2 25 12/09/2017   INR 1.26 12/09/2017   HGBA1C 6.8 (H) 11/11/2017      Assessment / Plan: The patient describes somewhat slow but steady progress in recovery.  I have encouraged her to try to increase ambulation and activity as tolerated.  I have encouraged her to start the cardiac rehab program.  I told her not to drive until she is more comfortable.  She does not appear to have pain at the level that would require narcotics.  She recently saw her primary care doctor who gave her something for cough but she is uncertain what the medication is.  Her x-ray findings are noted above.  Due to the somewhat slow recovery I feel it would be good to see her in the office in approximately 1 month to reevaluate.  She also has some findings on previous chest CT that may require reevaluation so that can be determined at that that time.  I have made no changes to her current medications.  I have encouraged her to check her blood sugars as she is a diabetic on Glucophage.          Rowe ClackWayne E Raguel Kosloski, PA-C 12/23/2017 2:36 PM

## 2017-12-23 NOTE — Patient Instructions (Signed)
We discussed activity progression including driving

## 2018-01-02 ENCOUNTER — Telehealth: Payer: Self-pay | Admitting: Internal Medicine

## 2018-01-02 NOTE — Telephone Encounter (Signed)
I called and spoke with the patient. She was calling about getting started in Cardiac Rehab. Per Ward Givenshris Berge, NP's last note, the patient was going to start get enrolled in Cardiac Rehab.  Reviewed with Ward Givenshris Berge, NP- per Thayer Ohmhris, the patient will need to see Dr. [name] to on 01/23/18 and get the ok from surgery to start rehab. I have notified the patient of this and she voices understanding.

## 2018-01-02 NOTE — Telephone Encounter (Signed)
Patient wants to discuss recommended cardiac rehab please call

## 2018-01-05 ENCOUNTER — Telehealth: Payer: Self-pay | Admitting: Nurse Practitioner

## 2018-01-05 NOTE — Telephone Encounter (Signed)
Received records request Disability Determination Services, [facility]. Mailed release packetto  pt to sign

## 2018-01-09 ENCOUNTER — Other Ambulatory Visit
Admission: RE | Admit: 2018-01-09 | Discharge: 2018-01-09 | Disposition: A | Discharge status: Home / Self Care | Payer: 59 | Source: Ambulatory Visit | Attending: Nurse Practitioner | Admitting: Nurse Practitioner

## 2018-01-09 DIAGNOSIS — R748 Abnormal levels of other serum enzymes: Secondary | ICD-10-CM | POA: Insufficient documentation

## 2018-01-09 DIAGNOSIS — Z79899 Other long term (current) drug therapy: Secondary | ICD-10-CM | POA: Insufficient documentation

## 2018-01-09 LAB — HEPATIC FUNCTION PANEL
ALT: 12 U/L — ABNORMAL LOW (ref 14–54)
AST: 19 U/L (ref 15–41)
Albumin: 3.8 g/dL (ref 3.5–5.0)
Alkaline Phosphatase: 90 U/L (ref 38–126)
Total Bilirubin: 0.4 mg/dL (ref 0.3–1.2)
Total Protein: 7.2 g/dL (ref 6.5–8.1)

## 2018-01-13 ENCOUNTER — Telehealth: Payer: Self-pay | Admitting: Internal Medicine

## 2018-01-13 NOTE — Telephone Encounter (Signed)
Received records request From Disability Determination Services , forwarded to [facility] for processing. ° °

## 2018-01-20 ENCOUNTER — Other Ambulatory Visit: Payer: Self-pay | Admitting: Cardiothoracic Surgery

## 2018-01-20 DIAGNOSIS — Z951 Presence of aortocoronary bypass graft: Secondary | ICD-10-CM

## 2018-01-21 ENCOUNTER — Encounter: Payer: Self-pay | Admitting: Cardiothoracic Surgery

## 2018-01-21 ENCOUNTER — Ambulatory Visit
Admission: RE | Admit: 2018-01-21 | Discharge: 2018-01-21 | Disposition: A | Discharge status: Home / Self Care | Payer: 59 | Source: Ambulatory Visit | Attending: Cardiothoracic Surgery | Admitting: Cardiothoracic Surgery

## 2018-01-21 ENCOUNTER — Ambulatory Visit (INDEPENDENT_AMBULATORY_CARE_PROVIDER_SITE_OTHER): Payer: Self-pay | Admitting: Cardiothoracic Surgery

## 2018-01-21 VITALS — BP 156/77 | HR 60 | Resp 20 | Ht 67.0 in | Wt 181.0 lb

## 2018-01-21 DIAGNOSIS — Z951 Presence of aortocoronary bypass graft: Secondary | ICD-10-CM

## 2018-01-21 DIAGNOSIS — I2510 Atherosclerotic heart disease of native coronary artery without angina pectoris: Secondary | ICD-10-CM

## 2018-01-21 NOTE — Progress Notes (Signed)
PCP is Caitlyn Herrera, Caitlyn Herrera Referring Provider is Caitlyn Herrera, Caitlyn Deerhristopher, MD  Chief Complaint  Patient presents with  . Routine Post Op    1 month f/u with CXR HX of CABG    HPI: Routine postop follow-up 2 months after CABG x3 Patient is doing well and ready to start cardiac rehab at [facility].  We will refer. 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 No symptoms of angina. Currently main complaint is of a dry cough.  She is on lisinopril for post MI afterload reduction and we will stop that.  She was seen by a primary care physician and started on inhalers for bronchitis.  Surgical incisions are healing well. Chest x-ray performed today is clear.  Sternal wires intact. The patient was evaluated for postop chest pain, readmitted and underwent cardiac catheterization which showed 3 of 3 bypass grafts patent although her native vessels in the right and circumflex circulation are very small.  Past Medical History:  Diagnosis Date  . Abdominal aortic ectasia (HCC)    a. 11/2017 CTA chest/abd/pelvis: 2.5 cm abd ao ectasia -->rec f/u u/s in 5 yrs.  [name]. CAD (coronary artery disease)    a. 11/04/17 Cath: Native multivessel dzs-->CABG x 3 (LIMA->LAD, VG->OM2, VG->RPDA; b. 11/2017 MV: mid antlat/apical isch; c. 11/2017 Cath: LM 30d, LAD 9559m, LCX 80p/m, OM1 90, OM2 80 (@ anastamosis of graft), RCA 80p, 13728m, 90d, LIMA->LAD nl, VG->OM2 nl, VG->RPDA nl-->Med Rx.  . Carotid arterial disease (HCC)    a. 10/2017 Carotid U/S: 1-30% bilat ICA stenosis.  [name]. History of echocardiogram    a. 11/04/2017 Echo: EF of 65-70%, no RWMA, nl LV diastolic fxn, nl RV size/fxn, mild TR.  [name]. Hyperlipidemia   . Hypertension   . Left renal artery stenosis (HCC)    a. 11/2017 CTA Chest/Abd/Pelvis: 50-70% L RA stenosis.  . Pulmonary nodule, right    a. 10/2017 CT Chest: 5mm pulm nodule in R lung apex - rec f/u w/ non-contrast chest CT in 1 year.  . Pure  hypercholesterolemia 11/07/2017  . Tobacco abuse   . Type II diabetes mellitus (HCC)    a. 10/2017 A1c 6.6.    Past Surgical History:  Procedure Laterality Date  . BREAST CYST EXCISION Right   . CARDIAC CATHETERIZATION    . CORONARY ARTERY BYPASS GRAFT N/A 11/12/2017   Procedure: CORONARY ARTERY BYPASS GRAFTING (CABG) x Three , using left internal mammary artery and right leg greater saphenous vein harvested endoscopically;  Surgeon: [name], [name], MD;  [facility];  Service: Open Heart Surgery;  Laterality: N/A;  . DILATION AND CURETTAGE OF UTERUS    . LAPAROSCOPIC CHOLECYSTECTOMY    . LEFT HEART CATH AND CORONARY ANGIOGRAPHY N/A 11/04/2017   Procedure: LEFT HEART CATH AND CORONARY ANGIOGRAPHY;  Surgeon: [name], Christopher, MD;  Location: [facility] INVASIVE CV LAB;  Service: Cardiovascular;  Laterality: N/A;  . LEFT HEART CATH AND CORS/GRAFTS ANGIOGRAPHY N/A 12/09/2017   Procedure: LEFT HEART CATH AND CORS/GRAFTS ANGIOGRAPHY;  Surgeon: SwazilandJordan, [name], MD;  [facility];  Service: Cardiovascular;  Laterality: N/A;  . TEE WITHOUT CARDIOVERSION N/A 11/12/2017   Procedure: TRANSESOPHAGEAL ECHOCARDIOGRAM (TEE);  Surgeon: [name], [name], MD;  [facility];  Service: Open Heart Surgery;  Laterality: N/A;    Family History  Problem Relation Age of Onset  . Hypertension Mother   . Heart Problems Mother     Social History Social  History   Tobacco Use  . Smoking status: Former Smoker    Packs/day: 1.00    Years: 42.00    Pack years: 42.00    Types: Cigarettes    Last attempt to quit: 11/01/2017    Years since quitting: 0.2  . Smokeless tobacco: Never Used  . Tobacco comment: 11/05/2017 "stopped smoking 11/01/2017"  Substance Use Topics  . Alcohol use: No  . Drug use: No    Current Outpatient Medications  Medication Sig Dispense Refill  . albuterol (PROVENTIL HFA;VENTOLIN HFA) 108 (90 Base) MCG/ACT inhaler Inhale into the lungs every 6 (six) hours as needed for wheezing  or shortness of breath.    [name] amiodarone (PACERONE) 200 MG tablet Take 1 tablet (200 mg total) by mouth daily. 30 tablet 1  . aspirin EC 325 MG tablet Take 1 tablet (325 mg total) by mouth daily. 90 tablet 3  . atorvastatin (LIPITOR) 80 MG tablet Take 1 tablet (80 mg total) by mouth daily at 6 PM. 30 tablet 3  . citalopram (CELEXA) 20 MG tablet Take 10 mg by mouth daily.    . fluticasone furoate-vilanterol (BREO ELLIPTA) 100-25 MCG/INH AEPB Inhale 1 puff into the lungs daily.    [name] lidocaine (XYLOCAINE) 5 % ointment Apply 1 application topically daily as needed.    [name] lisinopril (PRINIVIL,ZESTRIL) 10 MG tablet Take 1 tablet (10 mg total) by mouth daily. 90 tablet 3  . metFORMIN (GLUCOPHAGE) 500 MG tablet Take 1 tablet (500 mg total) by mouth 2 (two) times daily with a meal. Resume in 24 hrs. 60 tablet 1  . metoprolol tartrate (LOPRESSOR) 25 MG tablet Take 0.5 tablets (12.5 mg total) by mouth 2 (two) times daily. (Patient taking differently: Take 25 mg by mouth 2 (two) times daily. ) 30 tablet 1  . mometasone-formoterol (DULERA) 200-5 MCG/ACT AERO Inhale 2 puffs into the lungs 2 (two) times daily.    . nicotine (NICODERM CQ - DOSED IN MG/24 HOURS) 21 mg/24hr patch Place 21 mg onto the skin as directed.    . ondansetron (ZOFRAN) 4 MG tablet Take 1 tablet (4 mg total) by mouth every 8 (eight) hours as needed for nausea or vomiting. 20 tablet 0  . oxyCODONE (OXY IR/ROXICODONE) 5 MG immediate release tablet Take 1-2 tablets (5-10 mg total) by mouth every 6 (six) hours as needed for severe pain. 30 tablet 0  . pantoprazole (PROTONIX) 40 MG tablet Take 1 tablet (40 mg total) by mouth daily. 30 tablet 3   No current facility-administered medications for this visit.     No Known Allergies  Review of Systems  Still not smoking-was congratulated Dry cough day and night is her main complaint She is driving now and improving her strength and exercise tolerance  BP (!) 156/77   Pulse 60   Resp 20   Ht 5'  7" (1.702 m)   Wt 181 lb (82.1 kg)   SpO2 98% Comment: RA  BMI 28.35 kg/m  Physical Exam      Exam    General- alert and comfortable    Neck- no JVD, no cervical adenopathy palpable, no carotid bruit   Lungs- clear without rales, wheezes   Cor- regular rate and rhythm, no murmur , gallop   Abdomen- soft, non-tender   Extremities - warm, non-tender, minimal edema   Neuro- oriented, appropriate, no focal weakness   Diagnostic Tests: Chest x-ray clear  Impression: Doing well with persistent dry cough.  Maintaining sinus rhythm. She will stop  the amiodarone and lisinopril.   Plan: Return in 2 months to review progress at rehab.  Mikey Bussing, MD [facility] [phone]

## 2018-01-22 NOTE — Addendum Note (Signed)
Addended by: Tommye StandardBURGESS, Tell Rozelle F on: 01/22/2018 11:23 AM   Modules accepted: Orders

## 2018-02-05 ENCOUNTER — Encounter: Payer: Self-pay | Admitting: *Deleted

## 2018-02-05 ENCOUNTER — Encounter: Payer: 59 | Attending: Cardiovascular Disease | Admitting: *Deleted

## 2018-02-05 VITALS — Ht 67.0 in | Wt 187.9 lb

## 2018-02-05 DIAGNOSIS — I214 Non-ST elevation (NSTEMI) myocardial infarction: Secondary | ICD-10-CM | POA: Insufficient documentation

## 2018-02-05 DIAGNOSIS — Z951 Presence of aortocoronary bypass graft: Secondary | ICD-10-CM | POA: Diagnosis not present

## 2018-02-05 DIAGNOSIS — Z48812 Encounter for surgical aftercare following surgery on the circulatory system: Secondary | ICD-10-CM | POA: Insufficient documentation

## 2018-02-05 NOTE — Patient Instructions (Addendum)
Patient Instructions  Patient Details  Name: Caitlyn Herrera MRN: [id number] Date of Birth: 27-Jan-1961 Referring Provider:  Chilton Si, MD  Below are your personal goals for exercise, nutrition, and risk factors. Our goal is to help you stay on track towards obtaining and maintaining these goals. We will be discussing your progress on these goals with you throughout the program.  Initial Exercise Prescription: Initial Exercise Prescription - 02/05/18 1400      Date of Initial Exercise RX and Referring Provider   Date  02/05/18    Referring Provider  [place]/End      Treadmill   MPH  1.8    Grade  0    Minutes  15    METs  2.4      Recumbant Bike   Level  3    RPM  60    Watts  10    Minutes  15    METs  2.4      NuStep   Level  2    SPM  80    Minutes  15    METs  2.4      REL-XR   Level  2    Speed  50    Minutes  15    METs  2.4      Prescription Details   Frequency (times per week)  3    Duration  Progress to 45 minutes of aerobic exercise without signs/symptoms of physical distress      Intensity   THRR 40-80% of Max Heartrate  101-141    Ratings of Perceived Exertion  11-13    Perceived Dyspnea  0-4      Resistance Training   Training Prescription  Yes    Weight  3 lb    Reps  10-15       Exercise Goals: Frequency: Be able to perform aerobic exercise two to three times per week in program working toward 2-5 days per week of home exercise.  Intensity: Work with a perceived exertion of 11 (fairly light) - 15 (hard) while following your exercise prescription.  We will make changes to your prescription with you as you progress through the program.   Duration: Be able to do 30 to 45 minutes of continuous aerobic exercise in addition to a 5 minute warm-up and a 5 minute cool-down routine.   Nutrition Goals: Your personal nutrition goals will be established when you do your nutrition analysis with the dietician.  The following are general  nutrition guidelines to follow: Cholesterol < 200mg /day Sodium < 1500mg /day Fiber: Women over 50 yrs - 21 grams per day  Personal Goals: Personal Goals and Risk Factors at Admission - 02/05/18 1359      Core Components/Risk Factors/Patient Goals on Admission    Weight Management  Yes;Weight Loss    Intervention  Weight Management: Develop a combined nutrition and exercise program designed to reach desired caloric intake, while maintaining appropriate intake of nutrient and fiber, sodium and fats, and appropriate energy expenditure required for the weight goal.;Weight Management: Provide education and appropriate resources to help participant work on and attain dietary goals.;Weight Management/Obesity: Establish reasonable short term and long term weight goals.;Obesity: Provide education and appropriate resources to help participant work on and attain dietary goals.    Admit Weight  187 lb 14.4 oz (85.2 kg)    Goal Weight: Short Term  180 lb (81.6 kg)    Goal Weight: Long Term  175 lb (79.4 kg)  Expected Outcomes  Short Term: Continue to assess and modify interventions until short term weight is achieved;Weight Maintenance: Understanding of the daily nutrition guidelines, which includes 25-35% calories from fat, 7% or less cal from saturated fats, less than 200mg  cholesterol, less than 1.5gm of sodium, & 5 or more servings of fruits and vegetables daily;Weight Loss: Understanding of general recommendations for a balanced deficit meal plan, which promotes 1-2 lb weight loss per week and includes a negative energy balance of [phone] kcal/d;Understanding recommendations for meals to include 15-35% energy as protein, 25-35% energy from fat, 35-60% energy from carbohydrates, less than 200mg  of dietary cholesterol, 20-35 gm of total fiber daily;Understanding of distribution of calorie intake throughout the day with the consumption of 4-5 meals/snacks    Tobacco Cessation  Yes Quit 11/03/2017 when she had  her bypass surgery    Number of packs per day  0    Intervention  Assist the participant in steps to quit. Provide individualized education and counseling about committing to Tobacco Cessation, relapse prevention, and pharmacological support that can be provided by physician.;Education officer, environmentalffer self-teaching materials, assist with locating and accessing local/national Quit Smoking programs, and support quit date choice.    Expected Outcomes  Short Term: Will demonstrate readiness to quit, by selecting a quit date.;Long Term: Complete abstinence from all tobacco products for at least 12 months from quit date.;Short Term: Will quit all tobacco product use, adhering to prevention of relapse plan.    Improve shortness of breath with ADL's  Yes    Intervention  Provide education, individualized exercise plan and daily activity instruction to help decrease symptoms of SOB with activities of daily living.    Expected Outcomes  Short Term: Improve cardiorespiratory fitness to achieve a reduction of symptoms when performing ADLs;Long Term: Be able to perform more ADLs without symptoms or delay the onset of symptoms    Diabetes  Yes    Intervention  Provide education about signs/symptoms and action to take for hypo/hyperglycemia.;Provide education about proper nutrition, including hydration, and aerobic/resistive exercise prescription along with prescribed medications to achieve blood glucose in normal ranges: Fasting glucose 65-99 mg/dL    Expected Outcomes  Short Term: Participant verbalizes understanding of the signs/symptoms and immediate care of hyper/hypoglycemia, proper foot care and importance of medication, aerobic/resistive exercise and nutrition plan for blood glucose control.;Long Term: Attainment of HbA1C < 7%.    Hypertension  Yes    Intervention  Provide education on lifestyle modifcations including regular physical activity/exercise, weight management, moderate sodium restriction and increased consumption of  fresh fruit, vegetables, and low fat dairy, alcohol moderation, and smoking cessation.;Monitor prescription use compliance.    Expected Outcomes  Short Term: Continued assessment and intervention until BP is < 140/6690mm HG in hypertensive participants. < 130/7580mm HG in hypertensive participants with diabetes, heart failure or chronic kidney disease.;Long Term: Maintenance of blood pressure at goal levels.    Lipids  Yes    Intervention  Provide education and support for participant on nutrition & aerobic/resistive exercise along with prescribed medications to achieve LDL 70mg , HDL >40mg .    Expected Outcomes  Short Term: Participant states understanding of desired cholesterol values and is compliant with medications prescribed. Participant is following exercise prescription and nutrition guidelines.;Long Term: Cholesterol controlled with medications as prescribed, with individualized exercise RX and with personalized nutrition plan. Value goals: LDL < 70mg , HDL > 40 mg.    Stress  Yes    Intervention  Offer individual and/or small group education and counseling on  adjustment to heart disease, stress management and health-related lifestyle change. Teach and support self-help strategies.;Refer participants experiencing significant psychosocial distress to appropriate mental health specialists for further evaluation and treatment. When possible, include family members and significant others in education/counseling sessions.    Expected Outcomes  Short Term: Participant demonstrates changes in health-related behavior, relaxation and other stress management skills, ability to obtain effective social support, and compliance with psychotropic medications if prescribed.;Long Term: Emotional wellbeing is indicated by absence of clinically significant psychosocial distress or social isolation.    Personal Goal Other  Yes       Tobacco Use Initial Evaluation: Social History   Tobacco Use  Smoking Status Former  Smoker  . Packs/day: 1.00  . Years: 42.00  . Pack years: 42.00  . Types: Cigarettes  . Last attempt to quit: 11/01/2017  . Years since quitting: 0.2  Smokeless Tobacco Never Used  Tobacco Comment   11/05/2017 "stopped smoking 11/01/2017"    Exercise Goals and Review: Exercise Goals    Row Name 02/05/18 1435 02/05/18 1441           Exercise Goals   Increase Physical Activity  Yes  Yes      Intervention  Provide advice, education, support and counseling about physical activity/exercise needs.;Develop an individualized exercise prescription for aerobic and resistive training based on initial evaluation findings, risk stratification, comorbidities and participant's personal goals.  Provide advice, education, support and counseling about physical activity/exercise needs.;Develop an individualized exercise prescription for aerobic and resistive training based on initial evaluation findings, risk stratification, comorbidities and participant's personal goals.      Expected Outcomes  Short Term: Attend rehab on a regular basis to increase amount of physical activity.;Long Term: Add in home exercise to make exercise part of routine and to increase amount of physical activity.;Long Term: Exercising regularly at least 3-5 days a week.  Short Term: Attend rehab on a regular basis to increase amount of physical activity.;Long Term: Add in home exercise to make exercise part of routine and to increase amount of physical activity.;Long Term: Exercising regularly at least 3-5 days a week.      Increase Strength and Stamina  Yes  Yes      Intervention  Provide advice, education, support and counseling about physical activity/exercise needs.;Develop an individualized exercise prescription for aerobic and resistive training based on initial evaluation findings, risk stratification, comorbidities and participant's personal goals.  Provide advice, education, support and counseling about physical activity/exercise  needs.;Develop an individualized exercise prescription for aerobic and resistive training based on initial evaluation findings, risk stratification, comorbidities and participant's personal goals.      Expected Outcomes  Short Term: Increase workloads from initial exercise prescription for resistance, speed, and METs.;Short Term: Perform resistance training exercises routinely during rehab and add in resistance training at home;Long Term: Improve cardiorespiratory fitness, muscular endurance and strength as measured by increased METs and functional capacity ( )  Short Term: Increase workloads from initial exercise prescription for resistance, speed, and METs.;Short Term: Perform resistance training exercises routinely during rehab and add in resistance training at home;Long Term: Improve cardiorespiratory fitness, muscular endurance and strength as measured by increased METs and functional capacity ( )      Able to understand and use rate of perceived exertion (RPE) scale  Yes  Yes      Intervention  Provide education and explanation on how to use RPE scale  Provide education and explanation on how to use RPE scale      Expected  Outcomes  Short Term: Able to use RPE daily in rehab to express subjective intensity level;Long Term:  Able to use RPE to guide intensity level when exercising independently  Short Term: Able to use RPE daily in rehab to express subjective intensity level;Long Term:  Able to use RPE to guide intensity level when exercising independently      Able to understand and use Dyspnea scale  Yes  Yes      Intervention  Provide education and explanation on how to use Dyspnea scale  Provide education and explanation on how to use Dyspnea scale      Expected Outcomes  Short Term: Able to use Dyspnea scale daily in rehab to express subjective sense of shortness of breath during exertion;Long Term: Able to use Dyspnea scale to guide intensity level when exercising independently  Short Term:  Able to use Dyspnea scale daily in rehab to express subjective sense of shortness of breath during exertion;Long Term: Able to use Dyspnea scale to guide intensity level when exercising independently      Knowledge and understanding of Target Heart Rate Range (THRR)  Yes  Yes      Intervention  Provide education and explanation of THRR including how the numbers were predicted and where they are located for reference  Provide education and explanation of THRR including how the numbers were predicted and where they are located for reference      Expected Outcomes  Short Term: Able to state/look up THRR;Short Term: Able to use daily as guideline for intensity in rehab;Long Term: Able to use THRR to govern intensity when exercising independently  Short Term: Able to state/look up THRR;Short Term: Able to use daily as guideline for intensity in rehab;Long Term: Able to use THRR to govern intensity when exercising independently      Able to check pulse independently  Yes  Yes      Intervention  Provide education and demonstration on how to check pulse in carotid and radial arteries.;Review the importance of being able to check your own pulse for safety during independent exercise  Provide education and demonstration on how to check pulse in carotid and radial arteries.;Review the importance of being able to check your own pulse for safety during independent exercise      Expected Outcomes  Short Term: Able to explain why pulse checking is important during independent exercise;Long Term: Able to check pulse independently and accurately  Short Term: Able to explain why pulse checking is important during independent exercise;Long Term: Able to check pulse independently and accurately      Understanding of Exercise Prescription  Yes  Yes      Intervention  Provide education, explanation, and written materials on patient's individual exercise prescription  Provide education, explanation, and written materials on  patient's individual exercise prescription      Expected Outcomes  Short Term: Able to explain program exercise prescription;Long Term: Able to explain home exercise prescription to exercise independently  Short Term: Able to explain program exercise prescription;Long Term: Able to explain home exercise prescription to exercise independently         Copy of goals given to participant.

## 2018-02-05 NOTE — Progress Notes (Signed)
Daily Session Note  Patient Details  Name: Caitlyn Herrera MRN: [id number] Date of Birth: 04/03/1961 Referring Provider:     Cardiac Rehab from 02/05/2018 in [facility]  Referring Provider  [place]/End      Encounter Date: 02/05/2018  Check In: Session Check In - 02/05/18 1354      Check-In   Location  [facility]    Staff Present  Renita Papa, RN Geralyn Corwin, RN Vickki Hearing, BA, ACSM CEP, Exercise Physiologist    Supervising physician immediately available to respond to emergencies  See telemetry face sheet for immediately available ER MD    Medication changes reported      No    Fall or balance concerns reported     No    Tobacco Cessation  No Change    Warm-up and Cool-down  Performed as group-led instruction    Resistance Training Performed  Yes    VAD Patient?  No      Pain Assessment   Currently in Pain?  No/denies    Multiple Pain Sites  No        Exercise Prescription Changes - 02/05/18 1400      Response to Exercise   Blood Pressure (Admit)  142/76    Blood Pressure (Exercise)  166/70    Blood Pressure (Exit)  146/70    Heart Rate (Admit)  55 bpm    Heart Rate (Exercise)  90 bpm    Heart Rate (Exit)  57 bpm    Oxygen Saturation (Admit)  100 %    Oxygen Saturation (Exit)  99 %    Rating of Perceived Exertion (Exercise)  15    Perceived Dyspnea (Exercise)  2       Social History   Tobacco Use  Smoking Status Former Smoker  . Packs/day: 1.00  . Years: 42.00  . Pack years: 42.00  . Types: Cigarettes  . Last attempt to quit: 11/01/2017  . Years since quitting: 0.2  Smokeless Tobacco Never Used  Tobacco Comment   11/05/2017 "stopped smoking 11/01/2017"    Goals Met:  Exercise tolerated well Personal goals reviewed No report of cardiac concerns or symptoms Strength training completed today  Goals Unmet:  Not Applicable  Comments: Med Review and 6MW   Dr. Emily Filbert is Medical Director  for [place] and [facility].

## 2018-02-05 NOTE — Progress Notes (Signed)
Cardiac Individual Treatment Plan  Patient Details  Name: Caitlyn Herrera MRN: [id number] Date of Birth: [birth year] Referring Provider:     Cardiac Rehab from 02/05/2018 in [facility] Cardiac and Pulmonary Rehab  Referring Provider  [place]/End      Initial Encounter Date:    Cardiac Rehab from 02/05/2018 in [facility] Cardiac and Pulmonary Rehab  Date  02/05/18  Referring Provider  [place]/End      Visit Diagnosis: S/P CABG x 3  NSTEMI (non-ST elevated myocardial infarction) ([place])  Patient's Home Medications on Admission:  Current Outpatient Medications:  .  albuterol (PROVENTIL HFA;VENTOLIN HFA) 108 (90 Base) MCG/ACT inhaler, Inhale into the lungs every 6 (six) hours as needed for wheezing or shortness of breath., Disp: , Rfl:  .  amiodarone (PACERONE) 200 MG tablet, Take 1 tablet (200 mg total) by mouth daily., Disp: 30 tablet, Rfl: 1 .  aspirin EC 325 MG tablet, Take 1 tablet (325 mg total) by mouth daily., Disp: 90 tablet, Rfl: 3 .  atorvastatin (LIPITOR) 80 MG tablet, Take 1 tablet (80 mg total) by mouth daily at 6 PM., Disp: 30 tablet, Rfl: 3 .  citalopram (CELEXA) 20 MG tablet, Take 10 mg by mouth daily., Disp: , Rfl:  .  fluticasone furoate-vilanterol (BREO ELLIPTA) 100-25 MCG/INH AEPB, Inhale 1 puff into the lungs daily., Disp: , Rfl:  .  lidocaine (XYLOCAINE) 5 % ointment, Apply 1 application topically daily as needed., Disp: , Rfl:  .  lisinopril (PRINIVIL,ZESTRIL) 10 MG tablet, Take 1 tablet (10 mg total) by mouth daily., Disp: 90 tablet, Rfl: 3 .  metFORMIN (GLUCOPHAGE) 500 MG tablet, Take 1 tablet (500 mg total) by mouth 2 (two) times daily with a meal. Resume in 24 hrs., Disp: 60 tablet, Rfl: 1 .  metoprolol tartrate (LOPRESSOR) 25 MG tablet, Take 0.5 tablets (12.5 mg total) by mouth 2 (two) times daily. (Patient taking differently: Take 25 mg by mouth 2 (two) times daily. ), Disp: 30 tablet, Rfl: 1 .  mometasone-formoterol (DULERA) 200-5 MCG/ACT AERO, Inhale 2 puffs into the  lungs 2 (two) times daily., Disp: , Rfl:  .  nicotine (NICODERM CQ - DOSED IN MG/24 HOURS) 21 mg/24hr patch, Place 21 mg onto the skin as directed., Disp: , Rfl:  .  ondansetron (ZOFRAN) 4 MG tablet, Take 1 tablet (4 mg total) by mouth every 8 (eight) hours as needed for nausea or vomiting., Disp: 20 tablet, Rfl: 0 .  oxyCODONE (OXY IR/ROXICODONE) 5 MG immediate release tablet, Take 1-2 tablets (5-10 mg total) by mouth every 6 (six) hours as needed for severe pain. (Patient not taking: Reported on 02/05/2018), Disp: 30 tablet, Rfl: 0 .  pantoprazole (PROTONIX) 40 MG tablet, Take 1 tablet (40 mg total) by mouth daily., Disp: 30 tablet, Rfl: 3  Past Medical History: Past Medical History:  Diagnosis Date  . Abdominal aortic ectasia ([place])    a. 11/2017 CTA chest/abd/pelvis: 2.5 cm abd ao ectasia -->rec f/u u/s in 5 yrs.  [name] CAD (coronary artery disease)    a. 11/04/17 Cath: Native multivessel dzs-->CABG x 3 (LIMA->LAD, VG->OM2, VG->RPDA; b. 11/2017 MV: mid antlat/apical isch; c. 11/2017 Cath: LM 30d, LAD 55m LCX 80p/m, OM1 90, OM2 80 (@ anastamosis of graft), RCA 80p, 1060m90d, LIMA->LAD nl, VG->OM2 nl, VG->RPDA nl-->Med Rx.  . Carotid arterial disease ([facility]   a. 10/2017 Carotid U/S: 1-30% bilat ICA stenosis.  . Marland Kitchenistory of echocardiogram    a. 11/04/2017 Echo: EF of 65-70%, no RWMA, nl LV diastolic  fxn, nl RV size/fxn, mild TR.  [name] Hyperlipidemia   . Hypertension   . Left renal artery stenosis ([facility])    a. 11/2017 CTA Chest/Abd/Pelvis: 50-70% L RA stenosis.  . Pulmonary nodule, right    a. 10/2017 CT Chest: 21m pulm nodule in R lung apex - rec f/u w/ non-contrast chest CT in 1 year.  . Pure hypercholesterolemia 11/07/2017  . Tobacco abuse   . Type II diabetes mellitus ([facility]    a. 10/2017 A1c 6.6.    Tobacco Use: Social History   Tobacco Use  Smoking Status Former Smoker  . Packs/day: 1.00  . Years: 42.00  . Pack years: 42.00  . Types: Cigarettes  . Last attempt to quit: 11/01/2017  . Years since  quitting: 0.2  Smokeless Tobacco Never Used  Tobacco Comment   11/05/2017 "stopped smoking 11/01/2017"    Labs: Recent Review Flowsheet Data    Labs for ITP Cardiac and Pulmonary Rehab Latest Ref Rng & Units 11/13/2017 11/13/2017 11/13/2017 12/08/2017 12/17/2017   Cholestrol 100 - 199 mg/dL - - - - 103   LDLCALC 0 - 99 mg/dL - - - - 56   HDL >39 mg/dL - - - - 31(L)   Trlycerides 0 - 149 mg/dL - - - - 79   Hemoglobin A1c 4.8 - 5.6 % - - - - -   PHART 7.350 - 7.450 7.303(L) 7.343(L) - - -   PCO2ART 32.0 - 48.0 mmHg 47.0 43.2 - - -   HCO3 20.0 - 28.0 mmol/L 23.0 23.2 - - -   TCO2 22 - 32 mmol/L '24 24 23 26 '$ -   ACIDBASEDEF 0.0 - 2.0 mmol/L 3.0(H) 2.0 - - -   O2SAT % 99.0 97.0 - - -       Exercise Target Goals: Date: 02/05/18  Exercise Program Goal: Individual exercise prescription set using results from initial 6 min walk test and THRR while considering  patient's activity barriers and safety.   Exercise Prescription Goal: Initial exercise prescription builds to 30-45 minutes a day of aerobic activity, 2-3 days per week.  Home exercise guidelines will be given to patient during program as part of exercise prescription that the participant will acknowledge.  Activity Barriers & Risk Stratification: Activity Barriers & Cardiac Risk Stratification - 02/05/18 1415      Activity Barriers & Cardiac Risk Stratification   Activity Barriers  Arthritis;Shortness of Breath;Chest Pain/Angina arthritis in her hips    Cardiac Risk Stratification  High       6 Minute Walk: 6 Minute Walk    Row Name 02/05/18 1436         6 Minute Walk   Distance  942 feet     Distance Feet Change  0 ft     Walk Time  6 minutes     # of Rest Breaks  0     MPH  1.78     METS  2.9     RPE  15     Perceived Dyspnea   2     VO2 Peak  10.14     Symptoms  Yes (comment)     Comments  hip pain      Resting HR  61 bpm     Resting BP  142/76     Resting Oxygen Saturation   100 %     Exercise Oxygen Saturation   during 6 min walk  99 %     Max Ex. HR  90  bpm     Max Ex. BP  166/70     2 Minute Post BP  146/70        Oxygen Initial Assessment:   Oxygen Re-Evaluation:   Oxygen Discharge (Final Oxygen Re-Evaluation):   Initial Exercise Prescription: Initial Exercise Prescription - 02/05/18 1400      Date of Initial Exercise RX and Referring Provider   Date  02/05/18    Referring Provider  [place]/End      Treadmill   MPH  1.8    Grade  0    Minutes  15    METs  2.4      Recumbant Bike   Level  3    RPM  60    Watts  10    Minutes  15    METs  2.4      NuStep   Level  2    SPM  80    Minutes  15    METs  2.4      REL-XR   Level  2    Speed  50    Minutes  15    METs  2.4      Prescription Details   Frequency (times per week)  3    Duration  Progress to 45 minutes of aerobic exercise without signs/symptoms of physical distress      Intensity   THRR 40-80% of Max Heartrate  101-141    Ratings of Perceived Exertion  11-13    Perceived Dyspnea  0-4      Resistance Training   Training Prescription  Yes    Weight  3 lb    Reps  10-15       Perform Capillary Blood Glucose checks as needed.  Exercise Prescription Changes: Exercise Prescription Changes    Row Name 02/05/18 1400             Response to Exercise   Blood Pressure (Admit)  142/76       Blood Pressure (Exercise)  166/70       Blood Pressure (Exit)  146/70       Heart Rate (Admit)  55 bpm       Heart Rate (Exercise)  90 bpm       Heart Rate (Exit)  57 bpm       Oxygen Saturation (Admit)  100 %       Oxygen Saturation (Exit)  99 %       Rating of Perceived Exertion (Exercise)  15       Perceived Dyspnea (Exercise)  2          Exercise Comments:   Exercise Goals and Review: Exercise Goals    Row Name 02/05/18 1435 02/05/18 1441           Exercise Goals   Increase Physical Activity  Yes  Yes      Intervention  Provide advice, education, support and counseling about physical  activity/exercise needs.;Develop an individualized exercise prescription for aerobic and resistive training based on initial evaluation findings, risk stratification, comorbidities and participant's personal goals.  Provide advice, education, support and counseling about physical activity/exercise needs.;Develop an individualized exercise prescription for aerobic and resistive training based on initial evaluation findings, risk stratification, comorbidities and participant's personal goals.      Expected Outcomes  Short Term: Attend rehab on a regular basis to increase amount of physical activity.;Long Term: Add in home exercise to make exercise part of routine and to increase  amount of physical activity.;Long Term: Exercising regularly at least 3-5 days a week.  Short Term: Attend rehab on a regular basis to increase amount of physical activity.;Long Term: Add in home exercise to make exercise part of routine and to increase amount of physical activity.;Long Term: Exercising regularly at least 3-5 days a week.      Increase Strength and Stamina  Yes  Yes      Intervention  Provide advice, education, support and counseling about physical activity/exercise needs.;Develop an individualized exercise prescription for aerobic and resistive training based on initial evaluation findings, risk stratification, comorbidities and participant's personal goals.  Provide advice, education, support and counseling about physical activity/exercise needs.;Develop an individualized exercise prescription for aerobic and resistive training based on initial evaluation findings, risk stratification, comorbidities and participant's personal goals.      Expected Outcomes  Short Term: Increase workloads from initial exercise prescription for resistance, speed, and METs.;Short Term: Perform resistance training exercises routinely during rehab and add in resistance training at home;Long Term: Improve cardiorespiratory fitness, muscular  endurance and strength as measured by increased METs and functional capacity (6MWT)  Short Term: Increase workloads from initial exercise prescription for resistance, speed, and METs.;Short Term: Perform resistance training exercises routinely during rehab and add in resistance training at home;Long Term: Improve cardiorespiratory fitness, muscular endurance and strength as measured by increased METs and functional capacity (6MWT)      Able to understand and use rate of perceived exertion (RPE) scale  Yes  Yes      Intervention  Provide education and explanation on how to use RPE scale  Provide education and explanation on how to use RPE scale      Expected Outcomes  Short Term: Able to use RPE daily in rehab to express subjective intensity level;Long Term:  Able to use RPE to guide intensity level when exercising independently  Short Term: Able to use RPE daily in rehab to express subjective intensity level;Long Term:  Able to use RPE to guide intensity level when exercising independently      Able to understand and use Dyspnea scale  Yes  Yes      Intervention  Provide education and explanation on how to use Dyspnea scale  Provide education and explanation on how to use Dyspnea scale      Expected Outcomes  Short Term: Able to use Dyspnea scale daily in rehab to express subjective sense of shortness of breath during exertion;Long Term: Able to use Dyspnea scale to guide intensity level when exercising independently  Short Term: Able to use Dyspnea scale daily in rehab to express subjective sense of shortness of breath during exertion;Long Term: Able to use Dyspnea scale to guide intensity level when exercising independently      Knowledge and understanding of Target Heart Rate Range (THRR)  Yes  Yes      Intervention  Provide education and explanation of THRR including how the numbers were predicted and where they are located for reference  Provide education and explanation of THRR including how the  numbers were predicted and where they are located for reference      Expected Outcomes  Short Term: Able to state/look up THRR;Short Term: Able to use daily as guideline for intensity in rehab;Long Term: Able to use THRR to govern intensity when exercising independently  Short Term: Able to state/look up THRR;Short Term: Able to use daily as guideline for intensity in rehab;Long Term: Able to use THRR to govern intensity when exercising independently  Able to check pulse independently  Yes  Yes      Intervention  Provide education and demonstration on how to check pulse in carotid and radial arteries.;Review the importance of being able to check your own pulse for safety during independent exercise  Provide education and demonstration on how to check pulse in carotid and radial arteries.;Review the importance of being able to check your own pulse for safety during independent exercise      Expected Outcomes  Short Term: Able to explain why pulse checking is important during independent exercise;Long Term: Able to check pulse independently and accurately  Short Term: Able to explain why pulse checking is important during independent exercise;Long Term: Able to check pulse independently and accurately      Understanding of Exercise Prescription  Yes  Yes      Intervention  Provide education, explanation, and written materials on patient's individual exercise prescription  Provide education, explanation, and written materials on patient's individual exercise prescription      Expected Outcomes  Short Term: Able to explain program exercise prescription;Long Term: Able to explain home exercise prescription to exercise independently  Short Term: Able to explain program exercise prescription;Long Term: Able to explain home exercise prescription to exercise independently         Exercise Goals Re-Evaluation :   Discharge Exercise Prescription (Final Exercise Prescription Changes): Exercise Prescription  Changes - 02/05/18 1400      Response to Exercise   Blood Pressure (Admit)  142/76    Blood Pressure (Exercise)  166/70    Blood Pressure (Exit)  146/70    Heart Rate (Admit)  55 bpm    Heart Rate (Exercise)  90 bpm    Heart Rate (Exit)  57 bpm    Oxygen Saturation (Admit)  100 %    Oxygen Saturation (Exit)  99 %    Rating of Perceived Exertion (Exercise)  15    Perceived Dyspnea (Exercise)  2       Nutrition:  Target Goals: Understanding of nutrition guidelines, daily intake of sodium '1500mg'$ , cholesterol '200mg'$ , calories 30% from fat and 7% or less from saturated fats, daily to have 5 or more servings of fruits and vegetables.  Biometrics: Pre Biometrics - 02/05/18 1434      Pre Biometrics   Height  '5\' 7"'$  (1.702 m)    Weight  187 lb 14.4 oz (85.2 kg)    Waist Circumference  34 inches    Hip Circumference  42 inches    Waist to Hip Ratio  0.81 %    BMI (Calculated)  29.42    Single Leg Stand  11.12 seconds        Nutrition Therapy Plan and Nutrition Goals:   Nutrition Assessments: Nutrition Assessments - 02/05/18 1404      MEDFICTS Scores   Pre Score  65       Nutrition Goals Re-Evaluation:   Nutrition Goals Discharge (Final Nutrition Goals Re-Evaluation):   Psychosocial: Target Goals: Acknowledge presence or absence of significant depression and/or stress, maximize coping skills, provide positive support system. Participant is able to verbalize types and ability to use techniques and skills needed for reducing stress and depression.   Initial Review & Psychosocial Screening: Initial Psych Review & Screening - 02/05/18 1404      Initial Review   Current issues with  Current Depression;Current Anxiety/Panic;Current Psychotropic Meds;Current Sleep Concerns;Current Stress Concerns    Source of Stress Concerns  Chronic Illness;Family;Financial;Occupation;Unable to participate in former interests or  hobbies;Unable to perform yard/household activities     Comments  She has guardianship of her grandaughter which can be stressful at times. 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 She has filed for disability but it is taking a while. She states she has been put on depression medication since having her CABG in january.       Family Dynamics   Good Support System?  No    Strains  Intra-family strains    Comments  Her oldest daughter is her only support. She has guardianship of one of her teenage grandaughters.       Barriers   Psychosocial barriers to participate in program  The patient should benefit from training in stress management and relaxation.      Screening Interventions   Interventions  Encouraged to exercise;Program counselor consult;To provide support and resources with identified psychosocial needs;Provide feedback about the scores to participant    Expected Outcomes  Short Term goal: Utilizing psychosocial counselor, staff and physician to assist with identification of specific Stressors or current issues interfering with healing process. Setting desired goal for each stressor or current issue identified.;Long Term Goal: Stressors or current issues are controlled or eliminated.;Short Term goal: Identification and review with participant of any Quality of Life or Depression concerns found by scoring the questionnaire.;Long Term goal: The participant improves quality of Life and PHQ9 Scores as seen by post scores and/or verbalization of changes       Quality of Life Scores:  Quality of Life - 02/05/18 1407      Quality of Life Scores   Health/Function Pre  10.47 %    Socioeconomic Pre  12.5 %    Psych/Spiritual Pre  20.14 %    Family Pre  15.38 %    GLOBAL Pre  13.51 %      Scores of 19 and below usually indicate a poorer quality of life in these areas.  A difference of  2-3 points is a clinically meaningful difference.  A difference of 2-3 points in the  total score of the Quality of Life Index has been associated with significant improvement in overall quality of life, self-image, physical symptoms, and general health in studies assessing change in quality of life.  PHQ-9: Recent Review Flowsheet Data    Depression screen [facility] 2/9 02/05/2018   Decreased Interest 1   Down, Depressed, Hopeless 3   PHQ - 2 Score 4   Altered sleeping 3   Tired, decreased energy 2   Change in appetite 2   Feeling bad or failure about yourself  2   Trouble concentrating 2   Moving slowly or fidgety/restless 1   Suicidal thoughts 1    PHQ-9 Score 17   Difficult doing work/chores Very difficult     Interpretation of Total Score  Total Score Depression Severity:  1-4 = Minimal depression, 5-9 = Mild depression, 10-14 = Moderate depression, 15-19 = Moderately severe depression, 20-27 = Severe depression   Psychosocial Evaluation and Intervention:   Psychosocial Re-Evaluation:   Psychosocial Discharge (Final Psychosocial Re-Evaluation):   Vocational Rehabilitation: Provide vocational rehab assistance to qualifying candidates.   Vocational Rehab Evaluation & Intervention: Vocational Rehab - 02/05/18 1411      Initial Vocational Rehab Evaluation & Intervention   Assessment shows need for Vocational Rehabilitation  Yes    Vocational Rehab Packet given to patient  02/05/18    Documents  faxed to [facility] Dept of Vocational Rehabilitation  02/05/18       Education: Education Goals: Education classes will be provided on a variety of topics geared toward better understanding of heart health and risk factor modification. Participant will state understanding/return demonstration of topics presented as noted by education test scores.  Learning Barriers/Preferences: Learning Barriers/Preferences - 02/05/18 1410      Learning Barriers/Preferences   Learning Barriers  None    Learning Preferences  Skilled Demonstration;Verbal Instruction       Education  Topics:  AED/CPR: - Group verbal and written instruction with the use of models to demonstrate the basic use of the AED with the basic ABC's of resuscitation.   General Nutrition Guidelines/Fats and Fiber: -Group instruction provided by verbal, written material, models and posters to present the general guidelines for heart healthy nutrition. Gives an explanation and review of dietary fats and fiber.   Controlling Sodium/Reading Food Labels: -Group verbal and written material supporting the discussion of sodium use in heart healthy nutrition. Review and explanation with models, verbal and written materials for utilization of the food label.   Exercise Physiology & General Exercise Guidelines: - Group verbal and written instruction with models to review the exercise physiology of the cardiovascular system and associated critical values. Provides general exercise guidelines with specific guidelines to those with heart or lung disease.    Aerobic Exercise & Resistance Training: - Gives group verbal and written instruction on the various components of exercise. Focuses on aerobic and resistive training programs and the benefits of this training and how to safely progress through these programs..   Flexibility, Balance, Mind/Body Relaxation: Provides group verbal/written instruction on the benefits of flexibility and balance training, including mind/body exercise modes such as yoga, pilates and tai chi.  Demonstration and skill practice provided.   Stress and Anxiety: - Provides group verbal and written instruction about the health risks of elevated stress and causes of high stress.  Discuss the correlation between heart/lung disease and anxiety and treatment options. Review healthy ways to manage with stress and anxiety.   Depression: - Provides group verbal and written instruction on the correlation between heart/lung disease and depressed mood, treatment options, and the stigmas  associated with seeking treatment.   Anatomy & Physiology of the Heart: - Group verbal and written instruction and models provide basic cardiac anatomy and physiology, with the coronary electrical and arterial systems. Review of Valvular disease and Heart Failure   Cardiac Procedures: - Group verbal and written instruction to review commonly prescribed medications for heart disease. Reviews the medication, class of the drug, and side effects. Includes the steps to properly store meds and maintain the prescription regimen. (beta blockers and nitrates)   Cardiac Medications I: - Group verbal and written instruction to review commonly prescribed medications for heart disease. Reviews the medication, class of the drug, and side effects. Includes the steps to properly store meds and maintain the prescription regimen.   Cardiac Medications II: -Group verbal and written instruction to review commonly prescribed medications for heart disease. Reviews the medication, class of the drug, and side effects. (all other drug classes)    Go Sex-Intimacy & Heart Disease, Get SMART - Goal Setting: - Group verbal and written instruction through game format to discuss heart disease and the return to sexual intimacy. Provides group verbal and written material to discuss and apply goal setting through the application of the S.M.A.R.T. Method.   Other Matters of the Heart: - Provides group verbal,  written materials and models to describe Stable Angina and Peripheral Artery. Includes description of the disease process and treatment options available to the cardiac patient.   Exercise & Equipment Safety: - Individual verbal instruction and demonstration of equipment use and safety with use of the equipment.   Cardiac Rehab from 02/05/2018 in [facility] Cardiac and Pulmonary Rehab  Date  02/05/18  Educator  [facility]  Instruction Review Code  1- Verbalizes Understanding      Infection Prevention: - Provides verbal and  written material to individual with discussion of infection control including proper hand washing and proper equipment cleaning during exercise session.   Cardiac Rehab from 02/05/2018 in [facility] Cardiac and Pulmonary Rehab  Date  02/05/18  Educator  [facility]  Instruction Review Code  1- Verbalizes Understanding      Falls Prevention: - Provides verbal and written material to individual with discussion of falls prevention and safety.   Cardiac Rehab from 02/05/2018 in [facility] Cardiac and Pulmonary Rehab  Date  02/05/18  Educator  [facility]  Instruction Review Code  1- Verbalizes Understanding      Diabetes: - Individual verbal and written instruction to review signs/symptoms of diabetes, desired ranges of glucose level fasting, after meals and with exercise. Acknowledge that pre and post exercise glucose checks will be done for 3 sessions at entry of program.   Know Your Numbers and Risk Factors: -Group verbal and written instruction about important numbers in your health.  Discussion of what are risk factors and how they play a role in the disease process.  Review of Cholesterol, Blood Pressure, Diabetes, and BMI and the role they play in your overall health.   Sleep Hygiene: -Provides group verbal and written instruction about how sleep can affect your health.  Define sleep hygiene, discuss sleep cycles and impact of sleep habits. Review good sleep hygiene tips.    Other: -Provides group and verbal instruction on various topics (see comments)   Knowledge Questionnaire Score: Knowledge Questionnaire Score - 02/05/18 1411      Knowledge Questionnaire Score   Pre Score  18/28 correct answers reviewed with patient who verbalized understanding.        Core Components/Risk Factors/Patient Goals at Admission: Personal Goals and Risk Factors at Admission - 02/05/18 1359      Core Components/Risk Factors/Patient Goals on Admission    Weight Management  Yes;Weight Loss    Intervention  Weight  Management: Develop a combined nutrition and exercise program designed to reach desired caloric intake, while maintaining appropriate intake of nutrient and fiber, sodium and fats, and appropriate energy expenditure required for the weight goal.;Weight Management: Provide education and appropriate resources to help participant work on and attain dietary goals.;Weight Management/Obesity: Establish reasonable short term and long term weight goals.;Obesity: Provide education and appropriate resources to help participant work on and attain dietary goals.    Admit Weight  187 lb 14.4 oz (85.2 kg)    Goal Weight: Short Term  180 lb (81.6 kg)    Goal Weight: Long Term  175 lb (79.4 kg)    Expected Outcomes  Short Term: Continue to assess and modify interventions until short term weight is achieved;Weight Maintenance: Understanding of the daily nutrition guidelines, which includes 25-35% calories from fat, 7% or less cal from saturated fats, less than '200mg'$  cholesterol, less than 1.5gm of sodium, & 5 or more servings of fruits and vegetables daily;Weight Loss: Understanding of general recommendations for a balanced deficit meal plan, which promotes 1-2 lb weight loss  per week and includes a negative energy balance of [phone] kcal/d;Understanding recommendations for meals to include 15-35% energy as protein, 25-35% energy from fat, 35-60% energy from carbohydrates, less than '200mg'$  of dietary cholesterol, 20-35 gm of total fiber daily;Understanding of distribution of calorie intake throughout the day with the consumption of 4-5 meals/snacks    Tobacco Cessation  Yes Quit 11/03/2017 when she had her bypass surgery    Number of packs per day  0    Intervention  Assist the participant in steps to quit. Provide individualized education and counseling about committing to Tobacco Cessation, relapse prevention, and pharmacological support that can be provided by physician.;Advice worker, assist with locating  and accessing local/national Quit Smoking programs, and support quit date choice.    Expected Outcomes  Short Term: Will demonstrate readiness to quit, by selecting a quit date.;Long Term: Complete abstinence from all tobacco products for at least 12 months from quit date.;Short Term: Will quit all tobacco product use, adhering to prevention of relapse plan.    Improve shortness of breath with ADL's  Yes    Intervention  Provide education, individualized exercise plan and daily activity instruction to help decrease symptoms of SOB with activities of daily living.    Expected Outcomes  Short Term: Improve cardiorespiratory fitness to achieve a reduction of symptoms when performing ADLs;Long Term: Be able to perform more ADLs without symptoms or delay the onset of symptoms    Diabetes  Yes    Intervention  Provide education about signs/symptoms and action to take for hypo/hyperglycemia.;Provide education about proper nutrition, including hydration, and aerobic/resistive exercise prescription along with prescribed medications to achieve blood glucose in normal ranges: Fasting glucose 65-99 mg/dL    Expected Outcomes  Short Term: Participant verbalizes understanding of the signs/symptoms and immediate care of hyper/hypoglycemia, proper foot care and importance of medication, aerobic/resistive exercise and nutrition plan for blood glucose control.;Long Term: Attainment of HbA1C < 7%.    Hypertension  Yes    Intervention  Provide education on lifestyle modifcations including regular physical activity/exercise, weight management, moderate sodium restriction and increased consumption of fresh fruit, vegetables, and low fat dairy, alcohol moderation, and smoking cessation.;Monitor prescription use compliance.    Expected Outcomes  Short Term: Continued assessment and intervention until BP is < 140/57m HG in hypertensive participants. < 130/868mHG in hypertensive participants with diabetes, heart failure or  chronic kidney disease.;Long Term: Maintenance of blood pressure at goal levels.    Lipids  Yes    Intervention  Provide education and support for participant on nutrition & aerobic/resistive exercise along with prescribed medications to achieve LDL '70mg'$ , HDL >'40mg'$ .    Expected Outcomes  Short Term: Participant states understanding of desired cholesterol values and is compliant with medications prescribed. Participant is following exercise prescription and nutrition guidelines.;Long Term: Cholesterol controlled with medications as prescribed, with individualized exercise RX and with personalized nutrition plan. Value goals: LDL < '70mg'$ , HDL > 40 mg.    Stress  Yes    Intervention  Offer individual and/or small group education and counseling on adjustment to heart disease, stress management and health-related lifestyle change. Teach and support self-help strategies.;Refer participants experiencing significant psychosocial distress to appropriate mental health specialists for further evaluation and treatment. When possible, include family members and significant others in education/counseling sessions.    Expected Outcomes  Short Term: Participant demonstrates changes in health-related behavior, relaxation and other stress management skills, ability to obtain effective social support, and compliance with psychotropic medications if  prescribed.;Long Term: Emotional wellbeing is indicated by absence of clinically significant psychosocial distress or social isolation.    Personal Goal Other  Yes       Core Components/Risk Factors/Patient Goals Review:    Core Components/Risk Factors/Patient Goals at Discharge (Final Review):    ITP Comments: ITP Comments    Row Name 02/05/18 1355           ITP Comments  Medical Review Completed; initial ITP created. Diagnosis Documentation can be found in [facility] encounter dated 11/04/2017.          Comments: Initial ITP

## 2018-02-09 DIAGNOSIS — Z48812 Encounter for surgical aftercare following surgery on the circulatory system: Secondary | ICD-10-CM | POA: Diagnosis not present

## 2018-02-09 DIAGNOSIS — I214 Non-ST elevation (NSTEMI) myocardial infarction: Secondary | ICD-10-CM

## 2018-02-09 DIAGNOSIS — Z951 Presence of aortocoronary bypass graft: Secondary | ICD-10-CM

## 2018-02-09 LAB — GLUCOSE, CAPILLARY
GLUCOSE-CAPILLARY: 149 mg/dL — AB (ref 65–99)
GLUCOSE-CAPILLARY: 115 mg/dL — AB (ref 65–99)

## 2018-02-09 NOTE — Progress Notes (Signed)
Daily Session Note  Patient Details  Name: Caitlyn Herrera MRN: [id number] Date of Birth: May 19, 1961 Referring Provider:     Cardiac Rehab from 02/05/2018 in [facility] Cardiac [facility] Re[facility]  Referring Provider  [place]/End      Encounter Date: 02/09/2018  Check In: Session Check In - 02/09/18 1719      Check-In   Location  [facility]-Cardiac & Pulmonary Rehab    Staff Present  Earlean Shawl, BS, ACSM CEP, Exercise Physiologist;Kathaleen Dudziak Oletta Darter, BA, ACSM CEP, Exercise Physiologist;Carroll Enterkin, RN, BSN    Supervising physician immediately available to respond to emergencies  See telemetry face sheet for immediately available ER MD    Medication changes reported      No    Fall or balance concerns reported     No    Warm-up and Cool-down  Performed on first and last piece of equipment    Resistance Training Performed  Yes    VAD Patient?  No      Pain Assessment   Currently in Pain?  No/denies    Multiple Pain Sites  No          Social History   Tobacco Use  Smoking Status Former Smoker  . Packs/day: 1.00  . Years: 42.00  . Pack years: 42.00  . Types: Cigarettes  . Last attempt to quit: 11/01/2017  . Years since quitting: 0.2  Smokeless Tobacco Never Used  Tobacco Comment   11/05/2017 "stopped smoking 11/01/2017"    Goals Met:  Independence with exercise equipment Exercise tolerated well No report of cardiac concerns or symptoms Strength training completed today  Goals Unmet:  Not Applicable  Comments: First full day of exercise!  Patient was oriented to gym and equipment including functions, settings, policies, and procedures.  Patient's individual exercise prescription and treatment plan were reviewed.  All starting workloads were established based on the results of the 6 minute walk test done at initial orientation visit.  The plan for exercise progression was also introduced and progression will be customized based on patient's performance and goals.    Dr. Emily Filbert is Medical Director for [facility] and [facility].

## 2018-02-11 ENCOUNTER — Encounter: Payer: 59 | Admitting: *Deleted

## 2018-02-11 DIAGNOSIS — Z951 Presence of aortocoronary bypass graft: Secondary | ICD-10-CM

## 2018-02-11 DIAGNOSIS — I214 Non-ST elevation (NSTEMI) myocardial infarction: Secondary | ICD-10-CM

## 2018-02-11 DIAGNOSIS — Z48812 Encounter for surgical aftercare following surgery on the circulatory system: Secondary | ICD-10-CM | POA: Diagnosis not present

## 2018-02-11 LAB — GLUCOSE, CAPILLARY
GLUCOSE-CAPILLARY: 189 mg/dL — AB (ref 65–99)
Glucose-Capillary: 151 mg/dL — ABNORMAL HIGH (ref 65–99)

## 2018-02-11 NOTE — Progress Notes (Signed)
Daily Session Note  Patient Details  Name: Caitlyn Herrera MRN: [id number] Date of Birth: [birth year] Referring Provider:     Cardiac Rehab from 02/05/2018 in [facility] Cardiac and [facility] Rehab  Referring Provider  [place]/End      Encounter Date: 02/11/2018  Check In: Session Check In - 02/11/18 1637      Check-In   Location  [facility]-Cardiac & Pulmonary Rehab    Staff Present  Renita Papa, RN Vickki Hearing, BA, ACSM CEP, Exercise Physiologist;Carroll Enterkin, RN, BSN    Supervising physician immediately available to respond to emergencies  See telemetry face sheet for immediately available ER MD    Medication changes reported      No    Fall or balance concerns reported     No    Warm-up and Cool-down  Performed on first and last piece of equipment    Resistance Training Performed  Yes    VAD Patient?  No      Pain Assessment   Currently in Pain?  No/denies        Exercise Prescription Changes - 02/11/18 1500      Response to Exercise   Blood Pressure (Admit)  130/60    Blood Pressure (Exercise)  152/86    Blood Pressure (Exit)  138/62    Heart Rate (Admit)  68 bpm    Heart Rate (Exercise)  87 bpm    Heart Rate (Exit)  56 bpm    Rating of Perceived Exertion (Exercise)  15    Symptoms  none    Duration  Progress to 45 minutes of aerobic exercise without signs/symptoms of physical distress    Intensity  THRR unchanged      Progression   Progression  Continue to progress workloads to maintain intensity without signs/symptoms of physical distress.    Average METs  2.5      Resistance Training   Training Prescription  Yes    Weight  3 lb    Reps  10-15      Interval Training   Interval Training  No      Treadmill   MPH  1.5    Grade  0    Minutes  15    METs  2.15      Recumbant Bike   Level  1    Minutes  15    METs  2.73       Social History   Tobacco Use  Smoking Status Former Smoker  . Packs/day: 1.00  . Years: 42.00  . Pack years:  42.00  . Types: Cigarettes  . Last attempt to quit: 11/01/2017  . Years since quitting: 0.2  Smokeless Tobacco Never Used  Tobacco Comment   11/05/2017 "stopped smoking 11/01/2017"    Goals Met:  Independence with exercise equipment Exercise tolerated well No report of cardiac concerns or symptoms Strength training completed today  Goals Unmet:  Not Applicable  Comments: Pt able to follow exercise prescription today without complaint.  Will continue to monitor for progression.    Dr. Emily Filbert is Medical Director for [facility] and [facility].

## 2018-02-12 ENCOUNTER — Encounter: Payer: Self-pay | Admitting: Dietician

## 2018-02-12 ENCOUNTER — Encounter: Payer: 59 | Admitting: *Deleted

## 2018-02-12 DIAGNOSIS — I214 Non-ST elevation (NSTEMI) myocardial infarction: Secondary | ICD-10-CM

## 2018-02-12 DIAGNOSIS — Z951 Presence of aortocoronary bypass graft: Secondary | ICD-10-CM

## 2018-02-12 DIAGNOSIS — Z48812 Encounter for surgical aftercare following surgery on the circulatory system: Secondary | ICD-10-CM | POA: Diagnosis not present

## 2018-02-12 LAB — GLUCOSE, CAPILLARY
Glucose-Capillary: 112 mg/dL — ABNORMAL HIGH (ref 65–99)
Glucose-Capillary: 128 mg/dL — ABNORMAL HIGH (ref 65–99)

## 2018-02-12 NOTE — Progress Notes (Signed)
Daily Session Note  Patient Details  Name: Caitlyn Herrera MRN: [id number] Date of Birth: 05/28/1961 Referring Provider:     Cardiac Rehab from 02/05/2018 in [facility] Cardiac and Pulmonary Rehab  Referring Provider  /End      Encounter Date: 02/12/2018  Check In: Session Check In - 02/12/18 1647      Check-In   Location  [facility]-Cardiac & Pulmonary Rehab    Staff Present   , BS, ACSM CEP, Exercise Physiologist;Meredith Craven, RN BSN;Laureen Brown, BS, RRT, Respiratory Therapist    Supervising physician immediately available to respond to emergencies  See telemetry face sheet for immediately available ER MD    Medication changes reported      No    Fall or balance concerns reported     No    Warm-up and Cool-down  Performed on first and last piece of equipment    Resistance Training Performed  Yes    VAD Patient?  No      Pain Assessment   Currently in Pain?  No/denies    Multiple Pain Sites  No          Social History   Tobacco Use  Smoking Status Former Smoker  . Packs/day: 1.00  . Years: 42.00  . Pack years: 42.00  . Types: Cigarettes  . Last attempt to quit: 11/01/2017  . Years since quitting: 0.2  Smokeless Tobacco Never Used  Tobacco Comment   11/05/2017 "stopped smoking 11/01/2017"    Goals Met:  Independence with exercise equipment Exercise tolerated well No report of cardiac concerns or symptoms Strength training completed today  Goals Unmet:  Not Applicable  Comments: Pt able to follow exercise prescription today without complaint.  Will continue to monitor for progression.    Dr. Mark Miller is Medical Director for [facility] and [facility]. 

## 2018-02-18 ENCOUNTER — Encounter: Payer: Self-pay | Admitting: *Deleted

## 2018-02-18 DIAGNOSIS — Z48812 Encounter for surgical aftercare following surgery on the circulatory system: Secondary | ICD-10-CM | POA: Diagnosis not present

## 2018-02-18 DIAGNOSIS — Z951 Presence of aortocoronary bypass graft: Secondary | ICD-10-CM

## 2018-02-18 DIAGNOSIS — I214 Non-ST elevation (NSTEMI) myocardial infarction: Secondary | ICD-10-CM

## 2018-02-18 NOTE — Progress Notes (Signed)
Cardiac Individual Treatment Plan  Patient Details  Name: Caitlyn Herrera MRN: [id number] Date of Birth: 07-Dec-1960 Referring Provider:     Cardiac Rehab from 02/05/2018 in [facility] Cardiac and Pulmonary Rehab  Referring Provider  [place]/End      Initial Encounter Date:    Cardiac Rehab from 02/05/2018 in [facility] Cardiac and Pulmonary Rehab  Date  02/05/18  Referring Provider  [place]/End      Visit Diagnosis: S/P CABG x 3  Patient's Home Medications on Admission:  Current Outpatient Medications:  .  albuterol (PROVENTIL HFA;VENTOLIN HFA) 108 (90 Base) MCG/ACT inhaler, Inhale into the lungs every 6 (six) hours as needed for wheezing or shortness of breath., Disp: , Rfl:  .  amiodarone (PACERONE) 200 MG tablet, Take 1 tablet (200 mg total) by mouth daily., Disp: 30 tablet, Rfl: 1 .  aspirin EC 325 MG tablet, Take 1 tablet (325 mg total) by mouth daily., Disp: 90 tablet, Rfl: 3 .  atorvastatin (LIPITOR) 80 MG tablet, Take 1 tablet (80 mg total) by mouth daily at 6 PM., Disp: 30 tablet, Rfl: 3 .  citalopram (CELEXA) 20 MG tablet, Take 10 mg by mouth daily., Disp: , Rfl:  .  fluticasone furoate-vilanterol (BREO ELLIPTA) 100-25 MCG/INH AEPB, Inhale 1 puff into the lungs daily., Disp: , Rfl:  .  lidocaine (XYLOCAINE) 5 % ointment, Apply 1 application topically daily as needed., Disp: , Rfl:  .  lisinopril (PRINIVIL,ZESTRIL) 10 MG tablet, Take 1 tablet (10 mg total) by mouth daily., Disp: 90 tablet, Rfl: 3 .  metFORMIN (GLUCOPHAGE) 500 MG tablet, Take 1 tablet (500 mg total) by mouth 2 (two) times daily with a meal. Resume in 24 hrs., Disp: 60 tablet, Rfl: 1 .  metoprolol tartrate (LOPRESSOR) 25 MG tablet, Take 0.5 tablets (12.5 mg total) by mouth 2 (two) times daily. (Patient taking differently: Take 25 mg by mouth 2 (two) times daily. ), Disp: 30 tablet, Rfl: 1 .  mometasone-formoterol (DULERA) 200-5 MCG/ACT AERO, Inhale 2 puffs into the lungs 2 (two) times daily., Disp: , Rfl:  .  nicotine  (NICODERM CQ - DOSED IN MG/24 HOURS) 21 mg/24hr patch, Place 21 mg onto the skin as directed., Disp: , Rfl:  .  ondansetron (ZOFRAN) 4 MG tablet, Take 1 tablet (4 mg total) by mouth every 8 (eight) hours as needed for nausea or vomiting., Disp: 20 tablet, Rfl: 0 .  oxyCODONE (OXY IR/ROXICODONE) 5 MG immediate release tablet, Take 1-2 tablets (5-10 mg total) by mouth every 6 (six) hours as needed for severe pain. (Patient not taking: Reported on 02/05/2018), Disp: 30 tablet, Rfl: 0 .  pantoprazole (PROTONIX) 40 MG tablet, Take 1 tablet (40 mg total) by mouth daily., Disp: 30 tablet, Rfl: 3  Past Medical History: Past Medical History:  Diagnosis Date  . Abdominal aortic ectasia ([facility])    a. 11/2017 CTA chest/abd/pelvis: 2.5 cm abd ao ectasia -->rec f/u u/s in 5 yrs.  [name] CAD (coronary artery disease)    a. 11/04/17 Cath: Native multivessel dzs-->CABG x 3 (LIMA->LAD, VG->OM2, VG->RPDA; b. 11/2017 MV: mid antlat/apical isch; c. 11/2017 Cath: LM 30d, LAD 81m LCX 80p/m, OM1 90, OM2 80 (@ anastamosis of graft), RCA 80p, 1051m90d, LIMA->LAD nl, VG->OM2 nl, VG->RPDA nl-->Med Rx.  . [place] arterial [place]   a. 10/2017 Carotid U/S: 1-30% bilat ICA stenosis.  . Marland Kitchenistory of echocardiogram    a. 11/04/2017 Echo: EF of 65-70%, no RWMA, nl LV diastolic fxn, nl RV size/fxn, mild TR.  .[name]  Hyperlipidemia   . Hypertension   . Left renal artery stenosis ([facility])    a. 11/2017 CTA Chest/Abd/Pelvis: 50-70% L RA stenosis.  . Pulmonary nodule, right    a. 10/2017 CT Chest: 12m pulm nodule in R lung apex - rec f/u w/ non-contrast chest CT in 1 year.  . Pure hypercholesterolemia 11/07/2017  . Tobacco abuse   . Type II diabetes mellitus ([facility]    a. 10/2017 A1c 6.6.    Tobacco Use: Social History   Tobacco Use  Smoking Status Former Smoker  . Packs/day: 1.00  . Years: 42.00  . Pack years: 42.00  . Types: Cigarettes  . Last attempt to quit: 11/01/2017  . Years since quitting: 0.2  Smokeless Tobacco Never Used  Tobacco  Comment   11/05/2017 "stopped smoking 11/01/2017"    Labs: Recent Review Flowsheet Data    Labs for ITP Cardiac and Pulmonary Rehab Latest Ref Rng & Units 11/13/2017 11/13/2017 11/13/2017 12/08/2017 12/17/2017   Cholestrol 100 - 199 mg/dL - - - - 103   LDLCALC 0 - 99 mg/dL - - - - 56   HDL >39 mg/dL - - - - 31(L)   Trlycerides 0 - 149 mg/dL - - - - 79   Hemoglobin A1c 4.8 - 5.6 % - - - - -   PHART 7.350 - 7.450 7.303(L) 7.343(L) - - -   PCO2ART 32.0 - 48.0 mmHg 47.0 43.2 - - -   HCO3 20.0 - 28.0 mmol/L 23.0 23.2 - - -   TCO2 22 - 32 mmol/L '24 24 23 26 '$ -   ACIDBASEDEF 0.0 - 2.0 mmol/L 3.0(H) 2.0 - - -   O2SAT % 99.0 97.0 - - -       Exercise Target Goals:    Exercise Program Goal: Individual exercise prescription set using results from initial 6 min walk test and THRR while considering  patient's activity barriers and safety.   Exercise Prescription Goal: Initial exercise prescription builds to 30-45 minutes a day of aerobic activity, 2-3 days per week.  Home exercise guidelines will be given to patient during program as part of exercise prescription that the participant will acknowledge.  Activity Barriers & Risk Stratification: Activity Barriers & Cardiac Risk Stratification - 02/05/18 1415      Activity Barriers & Cardiac Risk Stratification   Activity Barriers  Arthritis;Shortness of Breath;Chest Pain/Angina arthritis in her hips    Cardiac Risk Stratification  High       6 Minute Walk: 6 Minute Walk    Row Name 02/05/18 1436         6 Minute Walk   Distance  942 feet     Distance Feet Change  0 ft     Walk Time  6 minutes     # of Rest Breaks  0     MPH  1.78     METS  2.9     RPE  15     Perceived Dyspnea   2     VO2 Peak  10.14     Symptoms  Yes (comment)     Comments  hip pain      Resting HR  61 bpm     Resting BP  142/76     Resting Oxygen Saturation   100 %     Exercise Oxygen Saturation  during 6 min walk  99 %     Max Ex. HR  90 bpm     Max Ex. BP  166/70     2 Minute Post BP  146/70        Oxygen Initial Assessment:   Oxygen Re-Evaluation:   Oxygen Discharge (Final Oxygen Re-Evaluation):   Initial Exercise Prescription: Initial Exercise Prescription - 02/05/18 1400      Date of Initial Exercise RX and Referring Provider   Date  02/05/18    Referring Provider  [place]/End      Treadmill   MPH  1.8    Grade  0    Minutes  15    METs  2.4      Recumbant Bike   Level  3    RPM  60    Watts  10    Minutes  15    METs  2.4      NuStep   Level  2    SPM  80    Minutes  15    METs  2.4      REL-XR   Level  2    Speed  50    Minutes  15    METs  2.4      Prescription Details   Frequency (times per week)  3    Duration  Progress to 45 minutes of aerobic exercise without signs/symptoms of physical distress      Intensity   THRR 40-80% of Max Heartrate  101-141    Ratings of Perceived Exertion  11-13    Perceived Dyspnea  0-4      Resistance Training   Training Prescription  Yes    Weight  3 lb    Reps  10-15       Perform Capillary Blood Glucose checks as needed.  Exercise Prescription Changes: Exercise Prescription Changes    Row Name 02/05/18 1400 02/11/18 1500           Response to Exercise   Blood Pressure (Admit)  142/76  130/60      Blood Pressure (Exercise)  166/70  152/86      Blood Pressure (Exit)  146/70  138/62      Heart Rate (Admit)  55 bpm  68 bpm      Heart Rate (Exercise)  90 bpm  87 bpm      Heart Rate (Exit)  57 bpm  56 bpm      Oxygen Saturation (Admit)  100 %  -      Oxygen Saturation (Exit)  99 %  -      Rating of Perceived Exertion (Exercise)  15  15      Perceived Dyspnea (Exercise)  2  -      Symptoms  -  none      Duration  -  Progress to 45 minutes of aerobic exercise without signs/symptoms of physical distress      Intensity  -  THRR unchanged        Progression   Progression  -  Continue to progress workloads to maintain intensity without signs/symptoms of  physical distress.      Average METs  -  2.5        Resistance Training   Training Prescription  -  Yes      Weight  -  3 lb      Reps  -  10-15        Interval Training   Interval Training  -  No        Treadmill   MPH  -  1.5  Grade  -  0      Minutes  -  15      METs  -  2.15        Recumbant Bike   Level  -  1      Minutes  -  15      METs  -  2.73         Exercise Comments: Exercise Comments    Row Name 02/09/18 1720           Exercise Comments  First full day of exercise!  Patient was oriented to gym and equipment including functions, settings, policies, and procedures.  Patient's individual exercise prescription and treatment plan were reviewed.  All starting workloads were established based on the results of the 6 minute walk test done at initial orientation visit.  The plan for exercise progression was also introduced and progression will be customized based on patient's performance and goals.          Exercise Goals and Review: Exercise Goals    Row Name 02/05/18 1435 02/05/18 1441           Exercise Goals   Increase Physical Activity  Yes  Yes      Intervention  Provide advice, education, support and counseling about physical activity/exercise needs.;Develop an individualized exercise prescription for aerobic and resistive training based on initial evaluation findings, risk stratification, comorbidities and participant's personal goals.  Provide advice, education, support and counseling about physical activity/exercise needs.;Develop an individualized exercise prescription for aerobic and resistive training based on initial evaluation findings, risk stratification, comorbidities and participant's personal goals.      Expected Outcomes  Short Term: Attend rehab on a regular basis to increase amount of physical activity.;Long Term: Add in home exercise to make exercise part of routine and to increase amount of physical activity.;Long Term: Exercising  regularly at least 3-5 days a week.  Short Term: Attend rehab on a regular basis to increase amount of physical activity.;Long Term: Add in home exercise to make exercise part of routine and to increase amount of physical activity.;Long Term: Exercising regularly at least 3-5 days a week.      Increase Strength and Stamina  Yes  Yes      Intervention  Provide advice, education, support and counseling about physical activity/exercise needs.;Develop an individualized exercise prescription for aerobic and resistive training based on initial evaluation findings, risk stratification, comorbidities and participant's personal goals.  Provide advice, education, support and counseling about physical activity/exercise needs.;Develop an individualized exercise prescription for aerobic and resistive training based on initial evaluation findings, risk stratification, comorbidities and participant's personal goals.      Expected Outcomes  Short Term: Increase workloads from initial exercise prescription for resistance, speed, and METs.;Short Term: Perform resistance training exercises routinely during rehab and add in resistance training at home;Long Term: Improve cardiorespiratory fitness, muscular endurance and strength as measured by increased METs and functional capacity (6MWT)  Short Term: Increase workloads from initial exercise prescription for resistance, speed, and METs.;Short Term: Perform resistance training exercises routinely during rehab and add in resistance training at home;Long Term: Improve cardiorespiratory fitness, muscular endurance and strength as measured by increased METs and functional capacity (6MWT)      Able to understand and use rate of perceived exertion (RPE) scale  Yes  Yes      Intervention  Provide education and explanation on how to use RPE scale  Provide education and explanation on how to use RPE scale  Expected Outcomes  Short Term: Able to use RPE daily in rehab to express  subjective intensity level;Long Term:  Able to use RPE to guide intensity level when exercising independently  Short Term: Able to use RPE daily in rehab to express subjective intensity level;Long Term:  Able to use RPE to guide intensity level when exercising independently      Able to understand and use Dyspnea scale  Yes  Yes      Intervention  Provide education and explanation on how to use Dyspnea scale  Provide education and explanation on how to use Dyspnea scale      Expected Outcomes  Short Term: Able to use Dyspnea scale daily in rehab to express subjective sense of shortness of breath during exertion;Long Term: Able to use Dyspnea scale to guide intensity level when exercising independently  Short Term: Able to use Dyspnea scale daily in rehab to express subjective sense of shortness of breath during exertion;Long Term: Able to use Dyspnea scale to guide intensity level when exercising independently      Knowledge and understanding of Target Heart Rate Range (THRR)  Yes  Yes      Intervention  Provide education and explanation of THRR including how the numbers were predicted and where they are located for reference  Provide education and explanation of THRR including how the numbers were predicted and where they are located for reference      Expected Outcomes  Short Term: Able to state/look up THRR;Short Term: Able to use daily as guideline for intensity in rehab;Long Term: Able to use THRR to govern intensity when exercising independently  Short Term: Able to state/look up THRR;Short Term: Able to use daily as guideline for intensity in rehab;Long Term: Able to use THRR to govern intensity when exercising independently      Able to check pulse independently  Yes  Yes      Intervention  Provide education and demonstration on how to check pulse in carotid and radial arteries.;Review the importance of being able to check your own pulse for safety during independent exercise  Provide education and  demonstration on how to check pulse in carotid and radial arteries.;Review the importance of being able to check your own pulse for safety during independent exercise      Expected Outcomes  Short Term: Able to explain why pulse checking is important during independent exercise;Long Term: Able to check pulse independently and accurately  Short Term: Able to explain why pulse checking is important during independent exercise;Long Term: Able to check pulse independently and accurately      Understanding of Exercise Prescription  Yes  Yes      Intervention  Provide education, explanation, and written materials on patient's individual exercise prescription  Provide education, explanation, and written materials on patient's individual exercise prescription      Expected Outcomes  Short Term: Able to explain program exercise prescription;Long Term: Able to explain home exercise prescription to exercise independently  Short Term: Able to explain program exercise prescription;Long Term: Able to explain home exercise prescription to exercise independently         Exercise Goals Re-Evaluation : Exercise Goals Re-Evaluation    Row Name 02/09/18 1720             Exercise Goal Re-Evaluation   Exercise Goals Review  Increase Physical Activity;Increase Strength and Stamina;Able to understand and use rate of perceived exertion (RPE) scale;Knowledge and understanding of Target Heart Rate Range (THRR);Understanding of Exercise Prescription  Comments  Reviewed RPE scale, THR and program prescription with pt today.  Pt voiced understanding and was given a copy of goals to take home.       Expected Outcomes  Short: Use RPE daily to regulate intensity.  Long: Follow program prescription in THR.          Discharge Exercise Prescription (Final Exercise Prescription Changes): Exercise Prescription Changes - 02/11/18 1500      Response to Exercise   Blood Pressure (Admit)  130/60    Blood Pressure (Exercise)   152/86    Blood Pressure (Exit)  138/62    Heart Rate (Admit)  68 bpm    Heart Rate (Exercise)  87 bpm    Heart Rate (Exit)  56 bpm    Rating of Perceived Exertion (Exercise)  15    Symptoms  none    Duration  Progress to 45 minutes of aerobic exercise without signs/symptoms of physical distress    Intensity  THRR unchanged      Progression   Progression  Continue to progress workloads to maintain intensity without signs/symptoms of physical distress.    Average METs  2.5      Resistance Training   Training Prescription  Yes    Weight  3 lb    Reps  10-15      Interval Training   Interval Training  No      Treadmill   MPH  1.5    Grade  0    Minutes  15    METs  2.15      Recumbant Bike   Level  1    Minutes  15    METs  2.73       Nutrition:  Target Goals: Understanding of nutrition guidelines, daily intake of sodium '1500mg'$ , cholesterol '200mg'$ , calories 30% from fat and 7% or less from saturated fats, daily to have 5 or more servings of fruits and vegetables.  Biometrics: Pre Biometrics - 02/05/18 1434      Pre Biometrics   Height  '5\' 7"'$  (1.702 m)    Weight  187 lb 14.4 oz (85.2 kg)    Waist Circumference  34 inches    Hip Circumference  42 inches    Waist to Hip Ratio  0.81 %    BMI (Calculated)  29.42    Single Leg Stand  11.12 seconds        Nutrition Therapy Plan and Nutrition Goals: Nutrition Therapy & Goals - 02/12/18 1620      Nutrition Therapy   Diet  TLC    Drug/Food Interactions  Statins/Certain Fruits    Fruits and Vegetables  5 servings/day    Sodium  1500 grams      Personal Nutrition Goals   Nutrition Goal  Drink more water and fewer sodas; try Nestle Splash, Nature's Twist sugar free, Diet or light tea, or some diet soda (ideally limit to 1-2 per day)    Personal Goal #2  Eat more vegetables and fruits-- have one or the other with meals and snacks.    Personal Goal #3  Use vegetable oil for cooking instead of butter.    Comments  Ms.  Zehring feels she does need to make some diet improvements. Food diary indicates some high fat choices; we discussed lower fat options, and provided her with individualized menus based on her preferences.       Intervention Plan   Intervention  Prescribe, educate and counsel regarding individualized specific dietary  modifications aiming towards targeted core components such as weight, hypertension, lipid management, diabetes, heart failure and other comorbidities.;Nutrition handout(s) given to patient.    Expected Outcomes  Short Term Goal: Understand basic principles of dietary content, such as calories, fat, sodium, cholesterol and nutrients.;Short Term Goal: A plan has been developed with personal nutrition goals set during dietitian appointment.;Long Term Goal: Adherence to prescribed nutrition plan.       Nutrition Assessments: Nutrition Assessments - 02/05/18 1404      MEDFICTS Scores   Pre Score  65       Nutrition Goals Re-Evaluation:   Nutrition Goals Discharge (Final Nutrition Goals Re-Evaluation):   Psychosocial: Target Goals: Acknowledge presence or absence of significant depression and/or stress, maximize coping skills, provide positive support system. Participant is able to verbalize types and ability to use techniques and skills needed for reducing stress and depression.   Initial Review & Psychosocial Screening: Initial Psych Review & Screening - 02/05/18 1404      Initial Review   Current issues with  Current Depression;Current Anxiety/Panic;Current Psychotropic Meds;Current Sleep Concerns;Current Stress Concerns    Source of Stress Concerns  Chronic Illness;Family;Financial;Occupation;Unable to participate in former interests or hobbies;Unable to perform yard/household activities    Comments  She has guardianship of her grandaughter which can be stressful at times. 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 She has filed for disability but it is taking a while. She states she has been put on depression medication since having her CABG in january.       Family Dynamics   Good Support System?  No    Strains  Intra-family strains    Comments  Her oldest daughter is her only support. She has guardianship of one of her teenage grandaughters.       Barriers   Psychosocial barriers to participate in program  The patient should benefit from training in stress management and relaxation.      Screening Interventions   Interventions  Encouraged to exercise;Program counselor consult;To provide support and resources with identified psychosocial needs;Provide feedback about the scores to participant    Expected Outcomes  Short Term goal: Utilizing psychosocial counselor, staff and physician to assist with identification of specific Stressors or current issues interfering with healing process. Setting desired goal for each stressor or current issue identified.;Long Term Goal: Stressors or current issues are controlled or eliminated.;Short Term goal: Identification and review with participant of any Quality of Life or Depression concerns found by scoring the questionnaire.;Long Term goal: The participant improves quality of Life and PHQ9 Scores as seen by post scores and/or verbalization of changes       Quality of Life Scores:  Quality of Life - 02/05/18 1407      Quality of Life Scores   Health/Function Pre  10.47 %    Socioeconomic Pre  12.5 %    Psych/Spiritual Pre  20.14 %    Family Pre  15.38 %    GLOBAL Pre  13.51 %      Scores of 19 and below usually indicate a poorer quality of life in these areas.  A difference of  2-3 points is a clinically meaningful difference.  A difference of 2-3 points in the total score of the Quality of Life Index has been associated with significant improvement in overall quality of life, self-image, physical symptoms, and general health in studies  assessing change  in quality of life.  PHQ-9: Recent Review Flowsheet Data    Depression screen [facility] 2/9 02/05/2018   Decreased Interest 1   Down, Depressed, Hopeless 3   PHQ - 2 Score 4   Altered sleeping 3   Tired, decreased energy 2   Change in appetite 2   Feeling bad or failure about yourself  2   Trouble concentrating 2   Moving slowly or fidgety/restless 1   Suicidal thoughts 1    PHQ-9 Score 17   Difficult doing work/chores Very difficult     Interpretation of Total Score  Total Score Depression Severity:  1-4 = Minimal depression, 5-9 = Mild depression, 10-14 = Moderate depression, 15-19 = Moderately severe depression, 20-27 = Severe depression   Psychosocial Evaluation and Intervention:   Psychosocial Re-Evaluation:   Psychosocial Discharge (Final Psychosocial Re-Evaluation):   Vocational Rehabilitation: Provide vocational rehab assistance to qualifying candidates.   Vocational Rehab Evaluation & Intervention: Vocational Rehab - 02/05/18 1411      Initial Vocational Rehab Evaluation & Intervention   Assessment shows need for Vocational Rehabilitation  Yes    Vocational Rehab Packet given to patient  02/05/18    Documents faxed to [facility] Dept of Vocational Rehabilitation  02/05/18       Education: Education Goals: Education classes will be provided on a variety of topics geared toward better understanding of heart health and risk factor modification. Participant will state understanding/return demonstration of topics presented as noted by education test scores.  Learning Barriers/Preferences: Learning Barriers/Preferences - 02/05/18 1410      Learning Barriers/Preferences   Learning Barriers  None    Learning Preferences  Skilled Demonstration;Verbal Instruction       Education Topics:  AED/CPR: - Group verbal and written instruction with the use of models to demonstrate the basic use of the AED with the basic ABC's of resuscitation.   General  Nutrition Guidelines/Fats and Fiber: -Group instruction provided by verbal, written material, models and posters to present the general guidelines for heart healthy nutrition. Gives an explanation and review of dietary fats and fiber.   Controlling Sodium/Reading Food Labels: -Group verbal and written material supporting the discussion of sodium use in heart healthy nutrition. Review and explanation with models, verbal and written materials for utilization of the food label.   Exercise Physiology & General Exercise Guidelines: - Group verbal and written instruction with models to review the exercise physiology of the cardiovascular system and associated critical values. Provides general exercise guidelines with specific guidelines to those with heart or lung disease.    Aerobic Exercise & Resistance Training: - Gives group verbal and written instruction on the various components of exercise. Focuses on aerobic and resistive training programs and the benefits of this training and how to safely progress through these programs..   Cardiac Rehab from 02/11/2018 in [facility] Cardiac and Pulmonary Rehab  Date  02/09/18  Educator  [facility]  Instruction Review Code  1- Geologist, engineering, Balance, Mind/Body Relaxation: Provides group verbal/written instruction on the benefits of flexibility and balance training, including mind/body exercise modes such as yoga, pilates and tai chi.  Demonstration and skill practice provided.   Stress and Anxiety: - Provides group verbal and written instruction about the health risks of elevated stress and causes of high stress.  Discuss the correlation between heart/lung disease and anxiety and treatment options. Review healthy ways to manage with stress and anxiety.   Depression: - Provides group verbal and written  instruction on the correlation between heart/lung disease and depressed mood, treatment options, and the stigmas associated with seeking  treatment.   Cardiac Rehab from 02/11/2018 in [facility] Cardiac and Pulmonary Rehab  Date  02/11/18  Educator  [facility]  Instruction Review Code  1- Verbalizes Understanding      Anatomy & Physiology of the Heart: - Group verbal and written instruction and models provide basic cardiac anatomy and physiology, with the coronary electrical and arterial systems. Review of Valvular disease and Heart Failure   Cardiac Procedures: - Group verbal and written instruction to review commonly prescribed medications for heart disease. Reviews the medication, class of the drug, and side effects. Includes the steps to properly store meds and maintain the prescription regimen. (beta blockers and nitrates)   Cardiac Medications I: - Group verbal and written instruction to review commonly prescribed medications for heart disease. Reviews the medication, class of the drug, and side effects. Includes the steps to properly store meds and maintain the prescription regimen.   Cardiac Medications II: -Group verbal and written instruction to review commonly prescribed medications for heart disease. Reviews the medication, class of the drug, and side effects. (all other drug classes)    Go Sex-Intimacy & Heart Disease, Get SMART - Goal Setting: - Group verbal and written instruction through game format to discuss heart disease and the return to sexual intimacy. Provides group verbal and written material to discuss and apply goal setting through the application of the S.M.A.R.T. Method.   Other Matters of the Heart: - Provides group verbal, written materials and models to describe Stable Angina and Peripheral Artery. Includes description of the disease process and treatment options available to the cardiac patient.   Exercise & Equipment Safety: - Individual verbal instruction and demonstration of equipment use and safety with use of the equipment.   Cardiac Rehab from 02/11/2018 in [facility] Cardiac and Pulmonary Rehab    Date  02/05/18  Educator  [facility]  Instruction Review Code  1- Verbalizes Understanding      Infection Prevention: - Provides verbal and written material to individual with discussion of infection control including proper hand washing and proper equipment cleaning during exercise session.   Cardiac Rehab from 02/11/2018 in [facility] Cardiac and Pulmonary Rehab  Date  02/05/18  Educator  [facility]  Instruction Review Code  1- Verbalizes Understanding      Falls Prevention: - Provides verbal and written material to individual with discussion of falls prevention and safety.   Cardiac Rehab from 02/11/2018 in [facility] Cardiac and Pulmonary Rehab  Date  02/05/18  Educator  [facility]  Instruction Review Code  1- Verbalizes Understanding      Diabetes: - Individual verbal and written instruction to review signs/symptoms of diabetes, desired ranges of glucose level fasting, after meals and with exercise. Acknowledge that pre and post exercise glucose checks will be done for 3 sessions at entry of program.   Know Your Numbers and Risk Factors: -Group verbal and written instruction about important numbers in your health.  Discussion of what are risk factors and how they play a role in the disease process.  Review of Cholesterol, Blood Pressure, Diabetes, and BMI and the role they play in your overall health.   Sleep Hygiene: -Provides group verbal and written instruction about how sleep can affect your health.  Define sleep hygiene, discuss sleep cycles and impact of sleep habits. Review good sleep hygiene tips.    Other: -Provides group and verbal instruction on various topics (see comments)  Knowledge Questionnaire Score: Knowledge Questionnaire Score - 02/05/18 1411      Knowledge Questionnaire Score   Pre Score  18/28 correct answers reviewed with patient who verbalized understanding.        Core Components/Risk Factors/Patient Goals at Admission: Personal Goals and Risk Factors at Admission -  02/05/18 1359      Core Components/Risk Factors/Patient Goals on Admission    Weight Management  Yes;Weight Loss    Intervention  Weight Management: Develop a combined nutrition and exercise program designed to reach desired caloric intake, while maintaining appropriate intake of nutrient and fiber, sodium and fats, and appropriate energy expenditure required for the weight goal.;Weight Management: Provide education and appropriate resources to help participant work on and attain dietary goals.;Weight Management/Obesity: Establish reasonable short term and long term weight goals.;Obesity: Provide education and appropriate resources to help participant work on and attain dietary goals.    Admit Weight  187 lb 14.4 oz (85.2 kg)    Goal Weight: Short Term  180 lb (81.6 kg)    Goal Weight: Long Term  175 lb (79.4 kg)    Expected Outcomes  Short Term: Continue to assess and modify interventions until short term weight is achieved;Weight Maintenance: Understanding of the daily nutrition guidelines, which includes 25-35% calories from fat, 7% or less cal from saturated fats, less than '200mg'$  cholesterol, less than 1.5gm of sodium, & 5 or more servings of fruits and vegetables daily;Weight Loss: Understanding of general recommendations for a balanced deficit meal plan, which promotes 1-2 lb weight loss per week and includes a negative energy balance of [phone] kcal/d;Understanding recommendations for meals to include 15-35% energy as protein, 25-35% energy from fat, 35-60% energy from carbohydrates, less than '200mg'$  of dietary cholesterol, 20-35 gm of total fiber daily;Understanding of distribution of calorie intake throughout the day with the consumption of 4-5 meals/snacks    Tobacco Cessation  Yes Quit 11/03/2017 when she had her bypass surgery    Number of packs per day  0    Intervention  Assist the participant in steps to quit. Provide individualized education and counseling about committing to Tobacco  Cessation, relapse prevention, and pharmacological support that can be provided by physician.;Advice worker, assist with locating and accessing local/national Quit Smoking programs, and support quit date choice.    Expected Outcomes  Short Term: Will demonstrate readiness to quit, by selecting a quit date.;Long Term: Complete abstinence from all tobacco products for at least 12 months from quit date.;Short Term: Will quit all tobacco product use, adhering to prevention of relapse plan.    Improve shortness of breath with ADL's  Yes    Intervention  Provide education, individualized exercise plan and daily activity instruction to help decrease symptoms of SOB with activities of daily living.    Expected Outcomes  Short Term: Improve cardiorespiratory fitness to achieve a reduction of symptoms when performing ADLs;Long Term: Be able to perform more ADLs without symptoms or delay the onset of symptoms    Diabetes  Yes    Intervention  Provide education about signs/symptoms and action to take for hypo/hyperglycemia.;Provide education about proper nutrition, including hydration, and aerobic/resistive exercise prescription along with prescribed medications to achieve blood glucose in normal ranges: Fasting glucose 65-99 mg/dL    Expected Outcomes  Short Term: Participant verbalizes understanding of the signs/symptoms and immediate care of hyper/hypoglycemia, proper foot care and importance of medication, aerobic/resistive exercise and nutrition plan for blood glucose control.;Long Term: Attainment of HbA1C < 7%.  Hypertension  Yes    Intervention  Provide education on lifestyle modifcations including regular physical activity/exercise, weight management, moderate sodium restriction and increased consumption of fresh fruit, vegetables, and low fat dairy, alcohol moderation, and smoking cessation.;Monitor prescription use compliance.    Expected Outcomes  Short Term: Continued assessment and  intervention until BP is < 140/45m HG in hypertensive participants. < 130/811mHG in hypertensive participants with diabetes, heart failure or chronic kidney disease.;Long Term: Maintenance of blood pressure at goal levels.    Lipids  Yes    Intervention  Provide education and support for participant on nutrition & aerobic/resistive exercise along with prescribed medications to achieve LDL '70mg'$ , HDL >'40mg'$ .    Expected Outcomes  Short Term: Participant states understanding of desired cholesterol values and is compliant with medications prescribed. Participant is following exercise prescription and nutrition guidelines.;Long Term: Cholesterol controlled with medications as prescribed, with individualized exercise RX and with personalized nutrition plan. Value goals: LDL < '70mg'$ , HDL > 40 mg.    Stress  Yes    Intervention  Offer individual and/or small group education and counseling on adjustment to heart disease, stress management and health-related lifestyle change. Teach and support self-help strategies.;Refer participants experiencing significant psychosocial distress to appropriate mental health specialists for further evaluation and treatment. When possible, include family members and significant others in education/counseling sessions.    Expected Outcomes  Short Term: Participant demonstrates changes in health-related behavior, relaxation and other stress management skills, ability to obtain effective social support, and compliance with psychotropic medications if prescribed.;Long Term: Emotional wellbeing is indicated by absence of clinically significant psychosocial distress or social isolation.    Personal Goal Other  Yes       Core Components/Risk Factors/Patient Goals Review:    Core Components/Risk Factors/Patient Goals at Discharge (Final Review):    ITP Comments: ITP Comments    Row Name 02/05/18 1355 02/18/18 0622         ITP Comments  Medical Review Completed; initial ITP  created. [facility]  30 day review. Continue with ITP unless directed changes per Medical Director   New to program         Comments:

## 2018-02-18 NOTE — Progress Notes (Signed)
Daily Session Note  Patient Details  Name: Caitlyn Herrera MRN: [id number] Date of Birth: 1961-03-10 Referring Provider:     Cardiac Rehab from 02/05/2018 in [facility] Cardiac and Pulmonary Rehab  Referring Provider  [place]/End      Encounter Date: 02/18/2018  Check In: Session Check In - 02/18/18 1659      Check-In   Location  [facility]-Cardiac & Pulmonary Rehab    Staff Present  Renita Papa, RN Vickki Hearing, BA, ACSM CEP, Exercise Physiologist;Carroll Enterkin, RN, BSN    Supervising physician immediately available to respond to emergencies  See telemetry face sheet for immediately available ER MD    Medication changes reported      No    Fall or balance concerns reported     No    Warm-up and Cool-down  Performed on first and last piece of equipment    Resistance Training Performed  Yes    VAD Patient?  No      Pain Assessment   Currently in Pain?  No/denies          Social History   Tobacco Use  Smoking Status Former Smoker  . Packs/day: 1.00  . Years: 42.00  . Pack years: 42.00  . Types: Cigarettes  . Last attempt to quit: 11/01/2017  . Years since quitting: 0.2  Smokeless Tobacco Never Used  Tobacco Comment   11/05/2017 "stopped smoking 11/01/2017"    Goals Met:  Independence with exercise equipment Exercise tolerated well No report of cardiac concerns or symptoms Strength training completed today  Goals Unmet:  Not Applicable  Comments: Pt able to follow exercise prescription today without complaint.  Will continue to monitor for progression.    Dr. Emily Filbert is Medical Director for [place] and [facility].

## 2018-02-19 DIAGNOSIS — Z951 Presence of aortocoronary bypass graft: Secondary | ICD-10-CM

## 2018-02-19 DIAGNOSIS — Z48812 Encounter for surgical aftercare following surgery on the circulatory system: Secondary | ICD-10-CM | POA: Diagnosis not present

## 2018-02-19 NOTE — Progress Notes (Signed)
Daily Session Note  Patient Details  Name: Caitlyn Herrera MRN: [id number] Date of Birth: [birth year] Referring Provider:     Cardiac Rehab from 02/05/2018 in [facility] Cardiac and Pulmonary Rehab  Referring Provider  [place]/End      Encounter Date: 02/19/2018  Check In: Session Check In - 02/19/18 1709      Check-In   Location  [facility]-Cardiac & Pulmonary Rehab    Staff Present  Earlean Shawl, BS, ACSM CEP, Exercise Physiologist;Meredith Sherryll Burger, RN BSN;Joseph Flavia Shipper    Supervising physician immediately available to respond to emergencies  See telemetry face sheet for immediately available ER MD    Medication changes reported      No    Fall or balance concerns reported     No    Tobacco Cessation  No Change    Warm-up and Cool-down  Performed on first and last piece of equipment    Resistance Training Performed  Yes    VAD Patient?  No      Pain Assessment   Currently in Pain?  No/denies          Social History   Tobacco Use  Smoking Status Former Smoker  . Packs/day: 1.00  . Years: 42.00  . Pack years: 42.00  . Types: Cigarettes  . Last attempt to quit: 11/01/2017  . Years since quitting: 0.3  Smokeless Tobacco Never Used  Tobacco Comment   11/05/2017 "stopped smoking 11/01/2017"    Goals Met:  Independence with exercise equipment Exercise tolerated well No report of cardiac concerns or symptoms Strength training completed today  Goals Unmet:  Not Applicable  Comments: Pt able to follow exercise prescription today without complaint.  Will continue to monitor for progression.   Dr. Emily Filbert is Medical Director for [place] and [facility].

## 2018-02-25 ENCOUNTER — Encounter: Payer: Medicaid Other | Attending: Cardiovascular Disease

## 2018-02-25 DIAGNOSIS — Z951 Presence of aortocoronary bypass graft: Secondary | ICD-10-CM | POA: Insufficient documentation

## 2018-02-25 DIAGNOSIS — Z48812 Encounter for surgical aftercare following surgery on the circulatory system: Secondary | ICD-10-CM | POA: Insufficient documentation

## 2018-02-25 DIAGNOSIS — I214 Non-ST elevation (NSTEMI) myocardial infarction: Secondary | ICD-10-CM | POA: Insufficient documentation

## 2018-03-04 DIAGNOSIS — Z48812 Encounter for surgical aftercare following surgery on the circulatory system: Secondary | ICD-10-CM | POA: Diagnosis present

## 2018-03-04 DIAGNOSIS — I214 Non-ST elevation (NSTEMI) myocardial infarction: Secondary | ICD-10-CM

## 2018-03-04 DIAGNOSIS — Z951 Presence of aortocoronary bypass graft: Secondary | ICD-10-CM

## 2018-03-04 NOTE — Progress Notes (Signed)
Daily Session Note  Patient Details  Name: Caitlyn Herrera MRN: [id number] Date of Birth: [birth year] Referring Provider:     Cardiac Rehab from 02/05/2018 in [facility] Cardiac and Pulmonary Rehab  Referring Provider  [place]/End      Encounter Date: 03/04/2018  Check In: Session Check In - 03/04/18 1716      Check-In   Location  [facility]-Cardiac & Pulmonary Rehab    Staff Present  Renita Papa, RN Vickki Hearing, BA, ACSM CEP, Exercise Physiologist;Carroll Enterkin, RN, BSN    Supervising physician immediately available to respond to emergencies  See telemetry face sheet for immediately available ER MD    Medication changes reported      No    Fall or balance concerns reported     No    Warm-up and Cool-down  Performed on first and last piece of equipment    Resistance Training Performed  Yes    VAD Patient?  No      Pain Assessment   Currently in Pain?  No/denies    Multiple Pain Sites  No          Social History   Tobacco Use  Smoking Status Former Smoker  . Packs/day: 1.00  . Years: 42.00  . Pack years: 42.00  . Types: Cigarettes  . Last attempt to quit: 11/01/2017  . Years since quitting: 0.3  Smokeless Tobacco Never Used  Tobacco Comment   11/05/2017 "stopped smoking 11/01/2017"    Goals Met:  Independence with exercise equipment Exercise tolerated well No report of cardiac concerns or symptoms Strength training completed today  Goals Unmet:  Not Applicable  Comments: Pt able to follow exercise prescription today without complaint.  Will continue to monitor for progression.    Dr. Emily Filbert is Medical Director for [place] and [facility].

## 2018-03-05 DIAGNOSIS — Z951 Presence of aortocoronary bypass graft: Secondary | ICD-10-CM

## 2018-03-05 DIAGNOSIS — Z48812 Encounter for surgical aftercare following surgery on the circulatory system: Secondary | ICD-10-CM | POA: Diagnosis not present

## 2018-03-05 NOTE — Progress Notes (Signed)
Daily Session Note  Patient Details  Name: Caitlyn Herrera MRN: [id number] Date of Birth: 16-Sep-1961 Referring Provider:     Cardiac Rehab from 02/05/2018 in [facility] Cardiac and Pulmonary Rehab  Referring Provider  [place]/End      Encounter Date: 03/05/2018  Check In: Session Check In - 03/05/18 1641      Check-In   Location  [facility]-Cardiac & Pulmonary Rehab    Staff Present  Renita Papa, RN Moises Blood, BS, ACSM CEP, Exercise Physiologist;Trevor Duty Flavia Shipper    Supervising physician immediately available to respond to emergencies  See telemetry face sheet for immediately available ER MD    Medication changes reported      No    Fall or balance concerns reported     No    Tobacco Cessation  No Change    Warm-up and Cool-down  Performed on first and last piece of equipment    Resistance Training Performed  Yes    VAD Patient?  No      Pain Assessment   Currently in Pain?  No/denies          Social History   Tobacco Use  Smoking Status Former Smoker  . Packs/day: 1.00  . Years: 42.00  . Pack years: 42.00  . Types: Cigarettes  . Last attempt to quit: 11/01/2017  . Years since quitting: 0.3  Smokeless Tobacco Never Used  Tobacco Comment   11/05/2017 "stopped smoking 11/01/2017"    Goals Met:  Independence with exercise equipment Exercise tolerated well No report of cardiac concerns or symptoms Strength training completed today  Goals Unmet:  Not Applicable  Comments: Pt able to follow exercise prescription today without complaint.  Will continue to monitor for progression.   Dr. Emily Filbert is Medical Director for [place] and [facility].

## 2018-03-09 ENCOUNTER — Encounter: Payer: Medicaid Other | Admitting: *Deleted

## 2018-03-09 DIAGNOSIS — Z48812 Encounter for surgical aftercare following surgery on the circulatory system: Secondary | ICD-10-CM | POA: Diagnosis not present

## 2018-03-09 DIAGNOSIS — I214 Non-ST elevation (NSTEMI) myocardial infarction: Secondary | ICD-10-CM

## 2018-03-09 DIAGNOSIS — Z951 Presence of aortocoronary bypass graft: Secondary | ICD-10-CM

## 2018-03-09 NOTE — Progress Notes (Signed)
Daily Session Note  Patient Details  Name: Caitlyn Herrera MRN: [id number] Date of Birth: May 18, 1961 Referring Provider:     Cardiac Rehab from 02/05/2018 in [facility] Cardiac and Pulmonary Rehab  Referring Provider  [place]/End      Encounter Date: 03/09/2018  Check In: Session Check In - 03/09/18 1623      Check-In   Location  [facility]-Cardiac & Pulmonary Rehab    Staff Present  Gerlene Burdock, RN, Moises Blood, BS, ACSM CEP, Exercise Physiologist;Amanda Oletta Darter, [place], ACSM CEP, Exercise Physiologist    Supervising physician immediately available to respond to emergencies  See telemetry face sheet for immediately available ER MD    Medication changes reported      No    Fall or balance concerns reported     No    Tobacco Cessation  No Change    Warm-up and Cool-down  Performed on first and last piece of equipment    Resistance Training Performed  Yes    VAD Patient?  No      Pain Assessment   Currently in Pain?  No/denies    Multiple Pain Sites  No          Social History   Tobacco Use  Smoking Status Former Smoker  . Packs/day: 1.00  . Years: 42.00  . Pack years: 42.00  . Types: Cigarettes  . Last attempt to quit: 11/01/2017  . Years since quitting: 0.3  Smokeless Tobacco Never Used  Tobacco Comment   11/05/2017 "stopped smoking 11/01/2017"    Goals Met:  Independence with exercise equipment Exercise tolerated well No report of cardiac concerns or symptoms Strength training completed today  Goals Unmet:  Not Applicable  Comments: Pt able to follow exercise prescription today without complaint.  Will continue to monitor for progression.    Dr. Emily Filbert is Medical Director for [place] and [facility].

## 2018-03-11 ENCOUNTER — Encounter: Payer: 59 | Admitting: Cardiothoracic Surgery

## 2018-03-11 ENCOUNTER — Encounter: Payer: Medicaid Other | Admitting: *Deleted

## 2018-03-11 DIAGNOSIS — I214 Non-ST elevation (NSTEMI) myocardial infarction: Secondary | ICD-10-CM

## 2018-03-11 DIAGNOSIS — Z48812 Encounter for surgical aftercare following surgery on the circulatory system: Secondary | ICD-10-CM | POA: Diagnosis not present

## 2018-03-11 DIAGNOSIS — Z951 Presence of aortocoronary bypass graft: Secondary | ICD-10-CM

## 2018-03-11 NOTE — Progress Notes (Signed)
Daily Session Note  Patient Details  Name: Caitlyn Herrera MRN: [id number] Date of Birth: September 27, 1961 Referring Provider:     Cardiac Rehab from 02/05/2018 in [facility] Cardiac and Pulmonary Rehab  Referring Provider  [place]/End      Encounter Date: 03/11/2018  Check In: Session Check In - 03/11/18 1627      Check-In   Location  [facility]-Cardiac & Pulmonary Rehab    Staff Present  Gerlene Burdock, RN, BSN;Cariann Kinnamon Sherryll Burger, RN Vickki Hearing, BA, ACSM CEP, Exercise Physiologist    Supervising physician immediately available to respond to emergencies  See telemetry face sheet for immediately available ER MD    Medication changes reported      No    Fall or balance concerns reported     No    Tobacco Cessation  No Change    Warm-up and Cool-down  Performed on first and last piece of equipment    Resistance Training Performed  Yes    VAD Patient?  No      Pain Assessment   Currently in Pain?  No/denies          Social History   Tobacco Use  Smoking Status Former Smoker  . Packs/day: 1.00  . Years: 42.00  . Pack years: 42.00  . Types: Cigarettes  . Last attempt to quit: 11/01/2017  . Years since quitting: 0.3  Smokeless Tobacco Never Used  Tobacco Comment   11/05/2017 "stopped smoking 11/01/2017"    Goals Met:  Independence with exercise equipment Exercise tolerated well No report of cardiac concerns or symptoms Strength training completed today  Goals Unmet:  Not Applicable  Comments: Pt able to follow exercise prescription today without complaint.  Will continue to monitor for progression.    Dr. Emily Filbert is Medical Director for [place] and [facility].

## 2018-03-12 ENCOUNTER — Encounter: Payer: Medicaid Other | Admitting: *Deleted

## 2018-03-12 ENCOUNTER — Encounter: Payer: Self-pay | Admitting: Cardiothoracic Surgery

## 2018-03-12 ENCOUNTER — Ambulatory Visit: Payer: Medicaid Other | Admitting: Cardiothoracic Surgery

## 2018-03-12 VITALS — BP 145/76 | HR 70 | Resp 20 | Ht 67.0 in | Wt 191.0 lb

## 2018-03-12 DIAGNOSIS — I2510 Atherosclerotic heart disease of native coronary artery without angina pectoris: Secondary | ICD-10-CM

## 2018-03-12 DIAGNOSIS — Z48812 Encounter for surgical aftercare following surgery on the circulatory system: Secondary | ICD-10-CM | POA: Diagnosis not present

## 2018-03-12 DIAGNOSIS — Z951 Presence of aortocoronary bypass graft: Secondary | ICD-10-CM

## 2018-03-12 DIAGNOSIS — I214 Non-ST elevation (NSTEMI) myocardial infarction: Secondary | ICD-10-CM

## 2018-03-12 MED ORDER — ALBUTEROL SULFATE HFA 108 (90 BASE) MCG/ACT IN AERS: 2.0000 | INHALATION_SPRAY | Freq: Four times a day (QID) | RESPIRATORY_TRACT | 2 refills | Status: DC | PRN

## 2018-03-12 MED ORDER — GABAPENTIN 300 MG PO CAPS: 300.0000 mg | ORAL_CAPSULE | Freq: Two times a day (BID) | ORAL | 3 refills | Status: DC

## 2018-03-12 NOTE — Progress Notes (Signed)
PCP is [name] Referring Provider is End, Cristal Deer, MD  Chief Complaint  Patient presents with  . Routine Post Op    2 month f/u, HX of CABG    HPI: Scheduled postop visit after multivessel CABG January 2019 Patient currently in cardiac rehab progressing slowly Patient states her cough has improved after stopping lisinopril Patient complains of severe hypersensitivity neuritic type pain of her sternal incision Not smoking, sinus rhythm  Past Medical History:  Diagnosis Date  . Abdominal aortic ectasia (HCC)    a. 11/2017 CTA chest/abd/pelvis: 2.5 cm abd ao ectasia -->rec f/u u/s in 5 yrs.  [name] CAD (coronary artery disease)    a. 11/04/17 Cath: Native multivessel dzs-->CABG x 3 (LIMA->LAD, VG->OM2, VG->RPDA; b. 11/2017 MV: mid antlat/apical isch; c. 11/2017 Cath: LM 30d, LAD 42m, LCX 80p/m, OM1 90, OM2 80 (@ anastamosis of graft), RCA 80p, 11m, 90d, LIMA->LAD nl, VG->OM2 nl, VG->RPDA nl-->Med Rx.  . Carotid arterial disease (HCC)    a. 10/2017 Carotid U/S: 1-30% bilat ICA stenosis.  [name] History of echocardiogram    a. 11/04/2017 Echo: EF of 65-70%, no RWMA, nl LV diastolic fxn, nl RV size/fxn, mild TR.  [name] Hyperlipidemia   . Hypertension   . Left renal artery stenosis (HCC)    a. 11/2017 CTA Chest/Abd/Pelvis: 50-70% L RA stenosis.  . Pulmonary nodule, right    a. 10/2017 CT Chest: 5mm pulm nodule in R lung apex - rec f/u w/ non-contrast chest CT in 1 year.  . Pure hypercholesterolemia 11/07/2017  . Tobacco abuse   . Type II diabetes mellitus (HCC)    a. 10/2017 A1c 6.6.    Past Surgical History:  Procedure Laterality Date  . BREAST CYST EXCISION Right   . CARDIAC CATHETERIZATION    . CORONARY ARTERY BYPASS GRAFT N/A 11/12/2017   Procedure: CORONARY ARTERY BYPASS GRAFTING (CABG) x Three , using left internal mammary artery and right leg greater saphenous vein harvested endoscopically;  Surgeon: Kerin Perna, MD;  Location: [facility] OR;  Service: Open Heart  Surgery;  Laterality: N/A;  . DILATION AND CURETTAGE OF UTERUS    . LAPAROSCOPIC CHOLECYSTECTOMY    . LEFT HEART CATH AND CORONARY ANGIOGRAPHY N/A 11/04/2017   Procedure: LEFT HEART CATH AND CORONARY ANGIOGRAPHY;  Surgeon: Yvonne Kendall, MD;  Location: [facility] INVASIVE CV LAB;  Service: Cardiovascular;  Laterality: N/A;  . LEFT HEART CATH AND CORS/GRAFTS ANGIOGRAPHY N/A 12/09/2017   Procedure: LEFT HEART CATH AND CORS/GRAFTS ANGIOGRAPHY;  Surgeon: [name], Danis Pembleton M, MD;  Location: [facility] INVASIVE CV LAB;  Service: Cardiovascular;  Laterality: N/A;  . TEE WITHOUT CARDIOVERSION N/A 11/12/2017   Procedure: TRANSESOPHAGEAL ECHOCARDIOGRAM (TEE);  Surgeon: Donata Clay, Theron Arista, MD;  Location: [facility] OR;  Service: Open Heart Surgery;  Laterality: N/A;    Family History  Problem Relation Age of Onset  . Hypertension Mother   . Heart Problems Mother     Social History Social History   Tobacco Use  . Smoking status: Former Smoker    Packs/day: 1.00    Years: 42.00    Pack years: 42.00    Types: Cigarettes    Last attempt to quit: 11/01/2017    Years since quitting: 0.3  . Smokeless tobacco: Never Used  . Tobacco comment: 11/05/2017 "stopped smoking 11/01/2017"  Substance Use Topics  . Alcohol use: No  . Drug use: No    Current Outpatient Medications  Medication Sig Dispense Refill  . albuterol (PROVENTIL HFA;VENTOLIN HFA) 108 (90 Base)  MCG/ACT inhaler Inhale 2 puffs into the lungs every 6 (six) hours as needed for wheezing or shortness of breath. 8 g 2  . amiodarone (PACERONE) 200 MG tablet Take 1 tablet (200 mg total) by mouth daily. 30 tablet 1  . aspirin EC 325 MG tablet Take 1 tablet (325 mg total) by mouth daily. 90 tablet 3  . atorvastatin (LIPITOR) 80 MG tablet Take 1 tablet (80 mg total) by mouth daily at 6 PM. 30 tablet 3  . citalopram (CELEXA) 20 MG tablet Take 10 mg by mouth daily.    . fluticasone furoate-vilanterol (BREO ELLIPTA) 100-25 MCG/INH AEPB Inhale 1 puff into the lungs daily.    [name]  lidocaine (XYLOCAINE) 5 % ointment Apply 1 application topically daily as needed.    [name] lisinopril (PRINIVIL,ZESTRIL) 10 MG tablet Take 1 tablet (10 mg total) by mouth daily. 90 tablet 3  . metFORMIN (GLUCOPHAGE) 500 MG tablet Take 1 tablet (500 mg total) by mouth 2 (two) times daily with a meal. Resume in 24 hrs. 60 tablet 1  . metoprolol tartrate (LOPRESSOR) 25 MG tablet Take 0.5 tablets (12.5 mg total) by mouth 2 (two) times daily. (Patient taking differently: Take 25 mg by mouth 2 (two) times daily. ) 30 tablet 1  . mometasone-formoterol (DULERA) 200-5 MCG/ACT AERO Inhale 2 puffs into the lungs 2 (two) times daily.    . nicotine (NICODERM CQ - DOSED IN MG/24 HOURS) 21 mg/24hr patch Place 21 mg onto the skin as directed.    . ondansetron (ZOFRAN) 4 MG tablet Take 1 tablet (4 mg total) by mouth every 8 (eight) hours as needed for nausea or vomiting. 20 tablet 0  . pantoprazole (PROTONIX) 40 MG tablet Take 1 tablet (40 mg total) by mouth daily. 30 tablet 3  . gabapentin (NEURONTIN) 300 MG capsule Take 1 capsule (300 mg total) by mouth 2 (two) times daily. 60 capsule 3   No current facility-administered medications for this visit.     No Known Allergies  Review of Systems  Improving her strength and activity level Still has problems with fatigue No problems with the surgical incisions other than hypersensitivity of the sternum  BP (!) 145/76   Pulse 70   Resp 20   Ht  (1.702 m)   Wt 191 lb (86.6 kg)   SpO2 98% Comment: RA  BMI 29.91 kg/m  Physical Exam      Exam    General- alert and comfortable    Neck- no JVD, no cervical adenopathy palpable, no carotid bruit   Lungs- clear without rales, wheezes   Cor- regular rate and rhythm, no murmur , gallop   Abdomen- soft, non-tender   Extremities - warm, non-tender, minimal edema   Neuro- oriented, appropriate, no focal weakness   Diagnostic Tests:  None Impression:  Progressing after multivessel CABG She has had neuritic  chest pain and will be started on Neurontin 300 mg twice daily She was recathed  after her surgery and her grafts are patent She is commended to have stop smoking  Plan: Continue with daily ambulation both at rehab and at home Continue current medications Will see back later this summer at which time amiodarone will be stopped.  She will trial Neurontin for her postoperative sternal pain   Mikey Bussing, MD [facility] [phone]

## 2018-03-12 NOTE — Progress Notes (Signed)
Daily Session Note  Patient Details  Name: TREASURE [facility] MRN: [id number] Date of Birth: 1961/03/16 Referring Provider:     Cardiac Rehab from 02/05/2018 in [facility] Cardiac [facility] Pul[facility] Rehab  Referring Provider  [place]/End      Encounter Date: 03/12/2018  Check In: Session Check In - 03/12/18 1710      Check-In   Location  [facility]-Cardiac & Pulmonary Rehab    Staff Present  Carson Myrtle, BS, RRT, Respiratory Therapist;Meredith Sherryll Burger, RN Moises Blood, BS, ACSM CEP, Exercise Physiologist;Amanda Oletta Darter, [place], ACSM CEP, Exercise Physiologist    Supervising physician immediately available to respond to emergencies  See telemetry face sheet for immediately available ER MD    Medication changes reported      No    Fall or balance concerns reported     No    Tobacco Cessation  No Change    Warm-up and Cool-down  Performed on first and last piece of equipment    Resistance Training Performed  Yes    VAD Patient?  No      Pain Assessment   Currently in Pain?  No/denies    Multiple Pain Sites  No        Exercise Prescription Changes - 03/12/18 1400      Home Exercise Plan   Plans to continue exercise at  Home (comment) considering FF or fitness center - walk at park or mall    Frequency  Add 2 additional days to program exercise sessions.    Initial Home Exercises Provided  03/11/18       Social History   Tobacco Use  Smoking Status Former Smoker  . Packs/day: 1.00  . Years: 42.00  . Pack years: 42.00  . Types: Cigarettes  . Last attempt to quit: 11/01/2017  . Years since quitting: 0.3  Smokeless Tobacco Never Used  Tobacco Comment   11/05/2017 "stopped smoking 11/01/2017"    Goals Met:  Independence with exercise equipment Exercise tolerated well No report of cardiac concerns or symptoms Strength training completed today  Goals Unmet:  Not Applicable  Comments: Pt able to follow exercise prescription today without complaint.  Will continue to monitor for  progression.      Dr. Emily Filbert is Medical Director for [place] and [facility].

## 2018-03-16 ENCOUNTER — Encounter: Payer: Medicaid Other | Admitting: *Deleted

## 2018-03-16 DIAGNOSIS — Z48812 Encounter for surgical aftercare following surgery on the circulatory system: Secondary | ICD-10-CM | POA: Diagnosis not present

## 2018-03-16 DIAGNOSIS — Z951 Presence of aortocoronary bypass graft: Secondary | ICD-10-CM

## 2018-03-16 DIAGNOSIS — I214 Non-ST elevation (NSTEMI) myocardial infarction: Secondary | ICD-10-CM

## 2018-03-16 NOTE — Progress Notes (Signed)
Daily Session Note  Patient Details  Name: KEYIA MORETTO MRN: [id number] Date of Birth: [birth year] Referring Provider:     Cardiac Rehab from 02/05/2018 in [facility] Cardiac and Pulmonary Rehab  Referring Provider  [place]/End      Encounter Date: 03/16/2018  Check In: Session Check In - 03/16/18 1626      Check-In   Location  [facility]-Cardiac & Pulmonary Rehab    Staff Present  Gerlene Burdock, RN, Moises Blood, BS, ACSM CEP, Exercise Physiologist;Amanda Oletta Darter, [place], ACSM CEP, Exercise Physiologist    Supervising physician immediately available to respond to emergencies  See telemetry face sheet for immediately available ER MD    Medication changes reported      No    Fall or balance concerns reported     No    Tobacco Cessation  No Change    Warm-up and Cool-down  Performed on first and last piece of equipment    Resistance Training Performed  Yes    VAD Patient?  No      Pain Assessment   Currently in Pain?  No/denies    Multiple Pain Sites  No          Social History   Tobacco Use  Smoking Status Former Smoker  . Packs/day: 1.00  . Years: 42.00  . Pack years: 42.00  . Types: Cigarettes  . Last attempt to quit: 11/01/2017  . Years since quitting: 0.3  Smokeless Tobacco Never Used  Tobacco Comment   11/05/2017 "stopped smoking 11/01/2017"    Goals Met:  Independence with exercise equipment Exercise tolerated well No report of cardiac concerns or symptoms Strength training completed today  Goals Unmet:  Not Applicable  Comments: Pt able to follow exercise prescription today without complaint.  Will continue to monitor for progression.    Dr. Emily Filbert is Medical Director for [place] and [facility].

## 2018-03-18 ENCOUNTER — Encounter: Payer: Self-pay | Admitting: *Deleted

## 2018-03-18 DIAGNOSIS — I214 Non-ST elevation (NSTEMI) myocardial infarction: Secondary | ICD-10-CM

## 2018-03-18 DIAGNOSIS — Z951 Presence of aortocoronary bypass graft: Secondary | ICD-10-CM

## 2018-03-18 DIAGNOSIS — Z48812 Encounter for surgical aftercare following surgery on the circulatory system: Secondary | ICD-10-CM | POA: Diagnosis not present

## 2018-03-18 NOTE — Progress Notes (Signed)
Daily Session Note  Patient Details  Name: Caitlyn Herrera MRN: [id number] Date of Birth: 1960/11/18 Referring Provider:     Cardiac Rehab from 02/05/2018 in [facility] Cardiac [facility] Rehab  Referring Provider  [place]/End      Encounter Date: 03/18/2018  Check In: Session Check In - 03/18/18 1738      Check-In   Location  [facility]Cardiac & Pulmonary Rehab    Staff Present  Gerlene Burdock, RN, BSN;Meredith Sherryll Burger, RN Vickki Hearing, BA, ACSM CEP, Exercise Physiologist    Supervising physician immediately available to respond to emergencies  See telemetry face sheet for immediately available ER MD    Medication changes reported      No    Fall or balance concerns reported     No    Warm-up and Cool-down  Performed on first and last piece of equipment    Resistance Training Performed  Yes    VAD Patient?  No      Pain Assessment   Currently in Pain?  No/denies    Multiple Pain Sites  No          Social History   Tobacco Use  Smoking Status Former Smoker  . Packs/day: 1.00  . Years: 42.00  . Pack years: 42.00  . Types: Cigarettes  . Last attempt to quit: 11/01/2017  . Years since quitting: 0.3  Smokeless Tobacco Never Used  Tobacco Comment   11/05/2017 "stopped smoking 11/01/2017"    Goals Met:  Independence with exercise equipment Exercise tolerated well No report of cardiac concerns or symptoms Strength training completed today  Goals Unmet:  Not Applicable  Comments: Pt able to follow exercise prescription today without complaint.  Will continue to monitor for progression.    Dr. Emily Filbert is Medical Director for [place] and [facility].

## 2018-03-18 NOTE — Progress Notes (Signed)
Cardiac Individual Treatment Plan  Patient Details  Name: Caitlyn Herrera MRN: [id number] Date of Birth: 07/13/1961 Referring Provider:     Cardiac Rehab from 02/05/2018 in [facility] Cardiac and Pulmonary Rehab  Referring Provider  [place]/End      Initial Encounter Date:    Cardiac Rehab from 02/05/2018 in [facility] Cardiac and Pulmonary Rehab  Date  02/05/18  Referring Provider  [place]/End      Visit Diagnosis: S/P CABG x 3  Patient's Home Medications on Admission:  Current Outpatient Medications:  .  albuterol (PROVENTIL HFA;VENTOLIN HFA) 108 (90 Base) MCG/ACT inhaler, Inhale 2 puffs into the lungs every 6 (six) hours as needed for wheezing or shortness of breath., Disp: 8 g, Rfl: 2 .  amiodarone (PACERONE) 200 MG tablet, Take 1 tablet (200 mg total) by mouth daily., Disp: 30 tablet, Rfl: 1 .  aspirin EC 325 MG tablet, Take 1 tablet (325 mg total) by mouth daily., Disp: 90 tablet, Rfl: 3 .  atorvastatin (LIPITOR) 80 MG tablet, Take 1 tablet (80 mg total) by mouth daily at 6 PM., Disp: 30 tablet, Rfl: 3 .  citalopram (CELEXA) 20 MG tablet, Take 10 mg by mouth daily., Disp: , Rfl:  .  fluticasone furoate-vilanterol (BREO ELLIPTA) 100-25 MCG/INH AEPB, Inhale 1 puff into the lungs daily., Disp: , Rfl:  .  gabapentin (NEURONTIN) 300 MG capsule, Take 1 capsule (300 mg total) by mouth 2 (two) times daily., Disp: 60 capsule, Rfl: 3 .  lidocaine (XYLOCAINE) 5 % ointment, Apply 1 application topically daily as needed., Disp: , Rfl:  .  lisinopril (PRINIVIL,ZESTRIL) 10 MG tablet, Take 1 tablet (10 mg total) by mouth daily., Disp: 90 tablet, Rfl: 3 .  metFORMIN (GLUCOPHAGE) 500 MG tablet, Take 1 tablet (500 mg total) by mouth 2 (two) times daily with a meal. Resume in 24 hrs., Disp: 60 tablet, Rfl: 1 .  metoprolol tartrate (LOPRESSOR) 25 MG tablet, Take 0.5 tablets (12.5 mg total) by mouth 2 (two) times daily. (Patient taking differently: Take 25 mg by mouth 2 (two) times daily. ), Disp: 30 tablet,  Rfl: 1 .  mometasone-formoterol (DULERA) 200-5 MCG/ACT AERO, Inhale 2 puffs into the lungs 2 (two) times daily., Disp: , Rfl:  .  nicotine (NICODERM CQ - DOSED IN MG/24 HOURS) 21 mg/24hr patch, Place 21 mg onto the skin as directed., Disp: , Rfl:  .  ondansetron (ZOFRAN) 4 MG tablet, Take 1 tablet (4 mg total) by mouth every 8 (eight) hours as needed for nausea or vomiting., Disp: 20 tablet, Rfl: 0 .  pantoprazole (PROTONIX) 40 MG tablet, Take 1 tablet (40 mg total) by mouth daily., Disp: 30 tablet, Rfl: 3  Past Medical History: Past Medical History:  Diagnosis Date  . Abdominal aortic ectasia ([place])    a. 11/2017 CTA chest/abd/pelvis: 2.5 cm abd ao ectasia -->rec f/u u/s in 5 yrs.  [name] CAD (coronary artery disease)    a. 11/04/17 Cath: Native multivessel dzs-->CABG x 3 (LIMA->LAD, VG->OM2, VG->RPDA; b. 11/2017 MV: mid antlat/apical isch; c. 11/2017 Cath: LM 30d, LAD 50m LCX 80p/m, OM1 90, OM2 80 (@ anastamosis of graft), RCA 80p, 1022m90d, LIMA->LAD nl, VG->OM2 nl, VG->RPDA nl-->Med Rx.  . Carotid arterial disease ([place]   a. 10/2017 Carotid U/S: 1-30% bilat ICA stenosis.  . Marland Kitchenistory of echocardiogram    a. 11/04/2017 Echo: EF of 65-70%, no RWMA, nl LV diastolic fxn, nl RV size/fxn, mild TR.  . Marland Kitchenyperlipidemia   . Hypertension   . Left renal  artery stenosis ([place])    a. 11/2017 CTA Chest/Abd/Pelvis: 50-70% L RA stenosis.  . Pulmonary nodule, right    a. 10/2017 CT Chest: 38m pulm nodule in R lung apex - rec f/u w/ non-contrast chest CT in 1 year.  . Pure hypercholesterolemia 11/07/2017  . Tobacco abuse   . [place] II diabetes mellitus ([place]    a. 10/2017 A1c 6.6.    Tobacco Use: Social History   Tobacco Use  Smoking Status Former Smoker  . Packs/day: 1.00  . Years: 42.00  . Pack years: 42.00  . Types: Cigarettes  . Last attempt to quit: 11/01/2017  . Years since quitting: 0.3  Smokeless Tobacco Never Used  Tobacco Comment   11/05/2017 "stopped smoking 11/01/2017"    Labs: Recent Review  Flowsheet Data    Labs for ITP Cardiac and Pulmonary Rehab Latest Ref Rng & Units 11/13/2017 11/13/2017 11/13/2017 12/08/2017 12/17/2017   Cholestrol 100 - 199 mg/dL - - - - 103   LDLCALC 0 - 99 mg/dL - - - - 56   HDL >39 mg/dL - - - - 31(L)   Trlycerides 0 - 149 mg/dL - - - - 79   Hemoglobin A1c 4.8 - 5.6 % - - - - -   PHART 7.350 - 7.450 7.303(L) 7.343(L) - - -   PCO2ART 32.0 - 48.0 mmHg 47.0 43.2 - - -   HCO3 20.0 - 28.0 mmol/L 23.0 23.2 - - -   TCO2 22 - 32 mmol/L '24 24 23 26 '$ -   ACIDBASEDEF 0.0 - 2.0 mmol/L 3.0(H) 2.0 - - -   O2SAT % 99.0 97.0 - - -       Exercise Target Goals:    Exercise Program Goal: Individual exercise prescription set using results from initial 6 min walk test and THRR while considering  patient's activity barriers and safety.   Exercise Prescription Goal: Initial exercise prescription builds to 30-45 minutes a day of aerobic activity, 2-3 days per week.  Home exercise guidelines will be given to patient during program as part of exercise prescription that the participant will acknowledge.  Activity Barriers & Risk Stratification: Activity Barriers & Cardiac Risk Stratification - 02/05/18 1415      Activity Barriers & Cardiac Risk Stratification   Activity Barriers  Arthritis;Shortness of Breath;Chest Pain/Angina arthritis in her hips    Cardiac Risk Stratification  High       6 Minute Walk: 6 Minute Walk    Row Name 02/05/18 1436         6 Minute Walk   Distance  942 feet     Distance Feet Change  0 ft     Walk Time  6 minutes     # of Rest Breaks  0     MPH  1.78     METS  2.9     RPE  15     Perceived Dyspnea   2     VO2 Peak  10.14     Symptoms  Yes (comment)     Comments  hip pain      Resting HR  61 bpm     Resting BP  142/76     Resting Oxygen Saturation   100 %     Exercise Oxygen Saturation  during 6 min walk  99 %     Max Ex. HR  90 bpm     Max Ex. BP  166/70     2 Minute Post BP  146/70        Oxygen Initial  Assessment:   Oxygen Re-Evaluation:   Oxygen Discharge (Final Oxygen Re-Evaluation):   Initial Exercise Prescription: Initial Exercise Prescription - 02/05/18 1400      Date of Initial Exercise RX and Referring Provider   Date  02/05/18    Referring Provider  [place]/End      Treadmill   MPH  1.8    Grade  0    Minutes  15    METs  2.4      Recumbant Bike   Level  3    RPM  60    Watts  10    Minutes  15    METs  2.4      NuStep   Level  2    SPM  80    Minutes  15    METs  2.4      REL-XR   Level  2    Speed  50    Minutes  15    METs  2.4      Prescription Details   Frequency (times per week)  3    Duration  Progress to 45 minutes of aerobic exercise without signs/symptoms of physical distress      Intensity   THRR 40-80% of Max Heartrate  101-141    Ratings of Perceived Exertion  11-13    Perceived Dyspnea  0-4      Resistance Training   Training Prescription  Yes    Weight  3 lb    Reps  10-15       Perform Capillary Blood Glucose checks as needed.  Exercise Prescription Changes: Exercise Prescription Changes    Row Name 02/05/18 1400 02/11/18 1500 02/26/18 1000 03/10/18 1100 03/12/18 1400     Response to Exercise   Blood Pressure (Admit)  142/76  130/60  128/74  130/80  -   Blood Pressure (Exercise)  166/70  152/86  132/70  162/80  -   Blood Pressure (Exit)  146/70  138/62  130/70  150/80  -   Heart Rate (Admit)  55 bpm  68 bpm  69 bpm  75 bpm  -   Heart Rate (Exercise)  90 bpm  87 bpm  79 bpm  93 bpm  -   Heart Rate (Exit)  57 bpm  56 bpm  63 bpm  73 bpm  -   Oxygen Saturation (Admit)  100 %  -  -  -  -   Oxygen Saturation (Exit)  99 %  -  -  -  -   Rating of Perceived Exertion (Exercise)  '15  15  13  13  '$ -   Perceived Dyspnea (Exercise)  2  -  -  -  -   Symptoms  -  none  none  none  -   Duration  -  Progress to 45 minutes of aerobic exercise without signs/symptoms of physical distress  Progress to 45 minutes of aerobic exercise  without signs/symptoms of physical distress  Continue with 45 min of aerobic exercise without signs/symptoms of physical distress.  -   Intensity  -  THRR unchanged  THRR unchanged  THRR unchanged  -     Progression   Progression  -  Continue to progress workloads to maintain intensity without signs/symptoms of physical distress.  Continue to progress workloads to maintain intensity without signs/symptoms of physical distress.  Continue to progress workloads to maintain intensity without  signs/symptoms of physical distress.  -   Average METs  -  2.5  2.4  2.15  -     Resistance Training   Training Prescription  -  Yes  Yes  Yes  -   Weight  -  3 lb  3 lb  3 lb  -   Reps  -  10-15  10-15  10-15  -     Interval Training   Interval Training  -  No  No  No  -     Treadmill   MPH  -  1.5  1.5  1.5  -   Grade  -  0  0  0  -   Minutes  -  '15  15  15  '$ -   METs  -  2.15  2.15  2.15  -     Recumbant Bike   Level  -  1  1  -  -   Watts  -  -  20  -  -   Minutes  -  15  15  -  -   METs  -  2.73  2.74  -  -     Biostep-RELP   Level  -  -  -  2  -   SPM  -  -  -  40  -   Minutes  -  -  -  15  -   METs  -  -  -  2  -     Home Exercise Plan   Plans to continue exercise at  -  -  -  -  Home (comment) considering FF or fitness center - walk at park or mall   Frequency  -  -  -  -  Add 2 additional days to program exercise sessions.   Initial Home Exercises Provided  -  -  -  -  03/11/18      Exercise Comments: Exercise Comments    Row Name 02/09/18 1720           Exercise Comments  First full day of exercise!  Patient was oriented to gym and equipment including functions, settings, policies, and procedures.  Patient's individual exercise prescription and treatment plan were reviewed.  All starting workloads were established based on the results of the 6 minute walk test done at initial orientation visit.  The plan for exercise progression was also introduced and progression will be  customized based on patient's performance and goals.          Exercise Goals and Review: Exercise Goals    Row Name 02/05/18 1435 02/05/18 1441           Exercise Goals   Increase Physical Activity  Yes  Yes      Intervention  Provide advice, education, support and counseling about physical activity/exercise needs.;Develop an individualized exercise prescription for aerobic and resistive training based on initial evaluation findings, risk stratification, comorbidities and participant's personal goals.  Provide advice, education, support and counseling about physical activity/exercise needs.;Develop an individualized exercise prescription for aerobic and resistive training based on initial evaluation findings, risk stratification, comorbidities and participant's personal goals.      Expected Outcomes  Short Term: Attend rehab on a regular basis to increase amount of physical activity.;Long Term: Add in home exercise to make exercise part of routine and to increase amount of physical activity.;Long Term: Exercising regularly at least 3-5 days a week.  Short Term:  Attend rehab on a regular basis to increase amount of physical activity.;Long Term: Add in home exercise to make exercise part of routine and to increase amount of physical activity.;Long Term: Exercising regularly at least 3-5 days a week.      Increase Strength and Stamina  Yes  Yes      Intervention  Provide advice, education, support and counseling about physical activity/exercise needs.;Develop an individualized exercise prescription for aerobic and resistive training based on initial evaluation findings, risk stratification, comorbidities and participant's personal goals.  Provide advice, education, support and counseling about physical activity/exercise needs.;Develop an individualized exercise prescription for aerobic and resistive training based on initial evaluation findings, risk stratification, comorbidities and participant's  personal goals.      Expected Outcomes  Short Term: Increase workloads from initial exercise prescription for resistance, speed, and METs.;Short Term: Perform resistance training exercises routinely during rehab and add in resistance training at home;Long Term: Improve cardiorespiratory fitness, muscular endurance and strength as measured by increased METs and functional capacity (6MWT)  Short Term: Increase workloads from initial exercise prescription for resistance, speed, and METs.;Short Term: Perform resistance training exercises routinely during rehab and add in resistance training at home;Long Term: Improve cardiorespiratory fitness, muscular endurance and strength as measured by increased METs and functional capacity (6MWT)      Able to understand and use rate of perceived exertion (RPE) scale  Yes  Yes      Intervention  Provide education and explanation on how to use RPE scale  Provide education and explanation on how to use RPE scale      Expected Outcomes  Short Term: Able to use RPE daily in rehab to express subjective intensity level;Long Term:  Able to use RPE to guide intensity level when exercising independently  Short Term: Able to use RPE daily in rehab to express subjective intensity level;Long Term:  Able to use RPE to guide intensity level when exercising independently      Able to understand and use Dyspnea scale  Yes  Yes      Intervention  Provide education and explanation on how to use Dyspnea scale  Provide education and explanation on how to use Dyspnea scale      Expected Outcomes  Short Term: Able to use Dyspnea scale daily in rehab to express subjective sense of shortness of breath during exertion;Long Term: Able to use Dyspnea scale to guide intensity level when exercising independently  Short Term: Able to use Dyspnea scale daily in rehab to express subjective sense of shortness of breath during exertion;Long Term: Able to use Dyspnea scale to guide intensity level when  exercising independently      Knowledge and understanding of Target Heart Rate Range (THRR)  Yes  Yes      Intervention  Provide education and explanation of THRR including how the numbers were predicted and where they are located for reference  Provide education and explanation of THRR including how the numbers were predicted and where they are located for reference      Expected Outcomes  Short Term: Able to state/look up THRR;Short Term: Able to use daily as guideline for intensity in rehab;Long Term: Able to use THRR to govern intensity when exercising independently  Short Term: Able to state/look up THRR;Short Term: Able to use daily as guideline for intensity in rehab;Long Term: Able to use THRR to govern intensity when exercising independently      Able to check pulse independently  Yes  Yes  Intervention  Provide education and demonstration on how to check pulse in carotid and radial arteries.;Review the importance of being able to check your own pulse for safety during independent exercise  Provide education and demonstration on how to check pulse in carotid and radial arteries.;Review the importance of being able to check your own pulse for safety during independent exercise      Expected Outcomes  Short Term: Able to explain why pulse checking is important during independent exercise;Long Term: Able to check pulse independently and accurately  Short Term: Able to explain why pulse checking is important during independent exercise;Long Term: Able to check pulse independently and accurately      Understanding of Exercise Prescription  Yes  Yes      Intervention  Provide education, explanation, and written materials on patient's individual exercise prescription  Provide education, explanation, and written materials on patient's individual exercise prescription      Expected Outcomes  Short Term: Able to explain program exercise prescription;Long Term: Able to explain home exercise prescription  to exercise independently  Short Term: Able to explain program exercise prescription;Long Term: Able to explain home exercise prescription to exercise independently         Exercise Goals Re-Evaluation : Exercise Goals Re-Evaluation    Row Name 02/09/18 1720 02/26/18 1041 03/10/18 1206 03/12/18 1436       Exercise Goal Re-Evaluation   Exercise Goals Review  Increase Physical Activity;Increase Strength and Stamina;Able to understand and use rate of perceived exertion (RPE) scale;Knowledge and understanding of Target Heart Rate Range (THRR);Understanding of Exercise Prescription  Increase Physical Activity;Able to understand and use rate of perceived exertion (RPE) scale;Increase Strength and Stamina  Increase Physical Activity;Able to understand and use rate of perceived exertion (RPE) scale;Increase Strength and Stamina  Increase Physical Activity;Able to understand and use rate of perceived exertion (RPE) scale;Knowledge and understanding of Target Heart Rate Range (THRR);Understanding of Exercise Prescription;Increase Strength and Stamina    Comments  Reviewed RPE scale, THR and program prescription with pt today.  Pt voiced understanding and was given a copy of goals to take home.  Arville Go has missed classes so far this week beacuse she has been sick.  She will progress more with regular attendance.  Nashira would progress further with regular attendance.  Staff will monitor progress.  Reviewed home exercise with pt - incl HR/RPE/safety.  Pt voiced understanding    Expected Outcomes  Short: Use RPE daily to regulate intensity.  Long: Follow program prescription in THR.  Short - Pt will attend regularly Long - Pt will progress MET level  Short - Pt will attedn 2-3 days per week Long - Pt wil improve overall MET levels  Short - Pt will add 1-2 days exercise outside program sessions Long - Pt will maintain exercise on her own       Discharge Exercise Prescription (Final Exercise Prescription  Changes): Exercise Prescription Changes - 03/12/18 1400      Home Exercise Plan   Plans to continue exercise at  Home (comment) considering FF or fitness center - walk at park or mall    Frequency  Add 2 additional days to program exercise sessions.    Initial Home Exercises Provided  03/11/18       Nutrition:  Target Goals: Understanding of nutrition guidelines, daily intake of sodium '1500mg'$ , cholesterol '200mg'$ , calories 30% from fat and 7% or less from saturated fats, daily to have 5 or more servings of fruits and vegetables.  Biometrics: Pre  Biometrics - 02/05/18 1434      Pre Biometrics   Height  '5\' 7"'$  (1.702 m)    Weight  187 lb 14.4 oz (85.2 kg)    Waist Circumference  34 inches    Hip Circumference  42 inches    Waist to Hip Ratio  0.81 %    BMI (Calculated)  29.42    Single Leg Stand  11.12 seconds        Nutrition Therapy Plan and Nutrition Goals:   Nutrition Assessments: Nutrition Assessments - 02/05/18 1404      MEDFICTS Scores   Pre Score  65       Nutrition Goals Re-Evaluation:   Nutrition Goals Discharge (Final Nutrition Goals Re-Evaluation):   Psychosocial: Target Goals: Acknowledge presence or absence of significant depression and/or stress, maximize coping skills, provide positive support system. Participant is able to verbalize types and ability to use techniques and skills needed for reducing stress and depression.   Initial Review & Psychosocial Screening: Initial Psych Review & Screening - 02/05/18 1404      Initial Review   Current issues with  Current Depression;Current Anxiety/Panic;Current Psychotropic Meds;Current Sleep Concerns;Current Stress Concerns    Source of Stress Concerns  Chronic Illness;Family;Financial;Occupation;Unable to participate in former interests or hobbies;Unable to perform yard/household activities    Comments  She has guardianship of her grandaughter which can be stressful at times. 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 She has filed for disability but it is taking a while. She states she has been put on depression medication since having her CABG in january.       Family Dynamics   Good Support System?  No    Strains  Intra-family strains    Comments  Her oldest daughter is her only support. She has guardianship of one of her teenage grandaughters.       Barriers   Psychosocial barriers to participate in program  The patient should benefit from training in stress management and relaxation.      Screening Interventions   Interventions  Encouraged to exercise;Program counselor consult;To provide support and resources with identified psychosocial needs;Provide feedback about the scores to participant    Expected Outcomes  Short Term goal: Utilizing psychosocial counselor, staff and physician to assist with identification of specific Stressors or current issues interfering with healing process. Setting desired goal for each stressor or current issue identified.;Long Term Goal: Stressors or current issues are controlled or eliminated.;Short Term goal: Identification and review with participant of any Quality of Life or Depression concerns found by scoring the questionnaire.;Long Term goal: The participant improves quality of Life and PHQ9 Scores as seen by post scores and/or verbalization of changes       Quality of Life Scores:  Quality of Life - 02/05/18 1407      Quality of Life Scores   Health/Function Pre  10.47 %    Socioeconomic Pre  12.5 %    Psych/Spiritual Pre  20.14 %    Family Pre  15.38 %    GLOBAL Pre  13.51 %      Scores of 19 and below usually indicate a poorer quality of life in these areas.  A difference of  2-3 points is a clinically meaningful difference.  A difference of 2-3 points in the total score of the Quality of Life Index has been associated with significant improvement in overall  quality of  life, self-image, physical symptoms, and general health in studies assessing change in quality of life.  PHQ-9: Recent Review Flowsheet Data    Depression screen [facility] 2/9 02/05/2018   Decreased Interest 1   Down, Depressed, Hopeless 3   PHQ - 2 Score 4   Altered sleeping 3   Tired, decreased energy 2   Change in appetite 2   Feeling bad or failure about yourself  2   Trouble concentrating 2   Moving slowly or fidgety/restless 1   Suicidal thoughts 1    PHQ-9 Score 17   Difficult doing work/chores Very difficult     Interpretation of Total Score  Total Score Depression Severity:  1-4 = Minimal depression, 5-9 = Mild depression, 10-14 = Moderate depression, 15-19 = Moderately severe depression, 20-27 = Severe depression   Psychosocial Evaluation and Intervention: Psychosocial Evaluation - 02/18/18 1736      Psychosocial Evaluation & Interventions   Interventions  Encouraged to exercise with the program and follow exercise prescription;Stress management education    Comments  Counselor met with Ms. Mewborn Di Kindle) today for initial psychosocial evaluation.  She is a 57 year old who had a heart attack & CABGx3 several months ago.  Mende has a strong support system with her daughters; her mother and her sister who live locally.  She has custody of her 43 year old granddaughter who lives in the home with her.  Sameen has multiple health issues with diabetes; COPD and a history of depression.  She reports not sleeping well with ~2-4 hours/night and uses Melatonin occasionally to help with this.  Her appetite is good currently.  Jayline states her depressive symptoms have worsened since the surgery in January.  She has a history of anxiety and is on medication to help with that.  However, recently she had multiple stressors occur with her daughter going to jail and she is having to help care for her 67 young children ages 61-10.  Counselor discussed setting healthy boundaries and limits with  others and saying "yes" to self-care at this time in order to help others at a later time.  Counselor also suggested [place] speaking with her Dr/Pharmacist about a controlled release melatonin to help with improved sleep currently.  Clydie has goals to increase her energy and feel more comfortable about exercising and not having another heart attack.  Staff will follow with Di Kindle.    Expected Outcomes  Short:  Airel will practice positive self-care by attending CR regularly and setting healthy boundaries with others while she is recovering.  She will also check with her Dr/pharmacist about long release melatonin to help with sleep.  Long:  [place]ayette will exercise consistently and attend the educational components of this program to feel mroe confident about exercising and ways to be heart healthier.      Continue Psychosocial Services   Follow up required by counselor       Psychosocial Re-Evaluation:   Psychosocial Discharge (Final Psychosocial Re-Evaluation):   Vocational Rehabilitation: Provide vocational rehab assistance to qualifying candidates.   Vocational Rehab Evaluation & Intervention: Vocational Rehab - 02/05/18 1411      Initial Vocational Rehab Evaluation & Intervention   Assessment shows need for Vocational Rehabilitation  Yes    Vocational Rehab Packet given to patient  02/05/18    Documents faxed to [facility] Dept of Vocational Rehabilitation  02/05/18       Education: Education Goals: Education classes will be provided on a variety of topics  geared toward better understanding of heart health and risk factor modification. Participant will state understanding/return demonstration of topics presented as noted by education test scores.  Learning Barriers/Preferences: Learning Barriers/Preferences - 02/05/18 1410      Learning Barriers/Preferences   Learning Barriers  None    Learning Preferences  Skilled Demonstration;Verbal Instruction       Education Topics:  AED/CPR: -  Group verbal and written instruction with the use of models to demonstrate the basic use of the AED with the basic ABC's of resuscitation.   General Nutrition Guidelines/Fats and Fiber: -Group instruction provided by verbal, written material, models and posters to present the general guidelines for heart healthy nutrition. Gives an explanation and review of dietary fats and fiber.   Cardiac Rehab from 03/16/2018 in [facility] Cardiac and Pulmonary Rehab  Date  03/16/18  Educator  CR  Instruction Review Code  1- Verbalizes Understanding      Controlling Sodium/Reading Food Labels: -Group verbal and written material supporting the discussion of sodium use in heart healthy nutrition. Review and explanation with models, verbal and written materials for utilization of the food label.   Exercise Physiology & General Exercise Guidelines: - Group verbal and written instruction with models to review the exercise physiology of the cardiovascular system and associated critical values. Provides general exercise guidelines with specific guidelines to those with heart or lung disease.    Aerobic Exercise & Resistance Training: - Gives group verbal and written instruction on the various components of exercise. Focuses on aerobic and resistive training programs and the benefits of this training and how to safely progress through these programs..   Cardiac Rehab from 03/16/2018 in [facility] Cardiac and Pulmonary Rehab  Date  02/09/18  Educator  [facility]  Instruction Review Code  1- Geologist, engineering, Balance, Mind/Body Relaxation: Provides group verbal/written instruction on the benefits of flexibility and balance training, including mind/body exercise modes such as yoga, pilates and tai chi.  Demonstration and skill practice provided.   Stress and Anxiety: - Provides group verbal and written instruction about the health risks of elevated stress and causes of high stress.  Discuss the  correlation between heart/lung disease and anxiety and treatment options. Review healthy ways to manage with stress and anxiety.   Depression: - Provides group verbal and written instruction on the correlation between heart/lung disease and depressed mood, treatment options, and the stigmas associated with seeking treatment.   Cardiac Rehab from 03/16/2018 in [facility] Cardiac and Pulmonary Rehab  Date  02/11/18  Educator  [facility]  Instruction Review Code  1- Verbalizes Understanding      Anatomy & Physiology of the Heart: - Group verbal and written instruction and models provide basic cardiac anatomy and physiology, with the coronary electrical and arterial systems. Review of Valvular disease and Heart Failure   Cardiac Procedures: - Group verbal and written instruction to review commonly prescribed medications for heart disease. Reviews the medication, class of the drug, and side effects. Includes the steps to properly store meds and maintain the prescription regimen. (beta blockers and nitrates)   Cardiac Rehab from 03/16/2018 in [facility] Cardiac and Pulmonary Rehab  Date  03/09/18  Educator  CE  Instruction Review Code  1- Verbalizes Understanding      Cardiac Medications I: - Group verbal and written instruction to review commonly prescribed medications for heart disease. Reviews the medication, class of the drug, and side effects. Includes the steps to properly store meds and maintain the  prescription regimen.   Cardiac Medications II: -Group verbal and written instruction to review commonly prescribed medications for heart disease. Reviews the medication, class of the drug, and side effects. (all other drug classes)   Cardiac Rehab from 03/16/2018 in [facility] Cardiac and Pulmonary Rehab  Date  02/18/18  Educator  [facility]  Instruction Review Code  1- Verbalizes Understanding       Go Sex-Intimacy & Heart Disease, Get SMART - Goal Setting: - Group verbal and written instruction through game format  to discuss heart disease and the return to sexual intimacy. Provides group verbal and written material to discuss and apply goal setting through the application of the S.M.A.R.T. Method.   Cardiac Rehab from 03/16/2018 in [facility] Cardiac and Pulmonary Rehab  Date  03/09/18  Educator  CE  Instruction Review Code  1- Verbalizes Understanding      Other Matters of the Heart: - Provides group verbal, written materials and models to describe Stable Angina and Peripheral Artery. Includes description of the disease process and treatment options available to the cardiac patient.   Exercise & Equipment Safety: - Individual verbal instruction and demonstration of equipment use and safety with use of the equipment.   Cardiac Rehab from 03/16/2018 in [facility] Cardiac and Pulmonary Rehab  Date  02/05/18  Educator  [facility]  Instruction Review Code  1- Verbalizes Understanding      Infection Prevention: - Provides verbal and written material to individual with discussion of infection control including proper hand washing and proper equipment cleaning during exercise session.   Cardiac Rehab from 03/16/2018 in [facility] Cardiac and Pulmonary Rehab  Date  02/05/18  Educator  [facility]  Instruction Review Code  1- Verbalizes Understanding      Falls Prevention: - Provides verbal and written material to individual with discussion of falls prevention and safety.   Cardiac Rehab from 03/16/2018 in [facility] Cardiac and Pulmonary Rehab  Date  02/05/18  Educator  [facility]  Instruction Review Code  1- Verbalizes Understanding      Diabetes: - Individual verbal and written instruction to review signs/symptoms of diabetes, desired ranges of glucose level fasting, after meals and with exercise. Acknowledge that pre and post exercise glucose checks will be done for 3 sessions at entry of program.   Know Your Numbers and Risk Factors: -Group verbal and written instruction about important numbers in your health.  Discussion of what are risk  factors and how they play a role in the disease process.  Review of Cholesterol, Blood Pressure, Diabetes, and BMI and the role they play in your overall health.   Cardiac Rehab from 03/16/2018 in [facility] Cardiac and Pulmonary Rehab  Date  02/18/18  Educator  [facility]  Instruction Review Code  1- Verbalizes Understanding      Sleep Hygiene: -Provides group verbal and written instruction about how sleep can affect your health.  Define sleep hygiene, discuss sleep cycles and impact of sleep habits. Review good sleep hygiene tips.    Cardiac Rehab from 03/16/2018 in [facility] Cardiac and Pulmonary Rehab  Date  03/11/18  Educator  [facility]  Instruction Review Code  1- Verbalizes Understanding      Other: -Provides group and verbal instruction on various topics (see comments)   Knowledge Questionnaire Score: Knowledge Questionnaire Score - 02/05/18 1411      Knowledge Questionnaire Score   Pre Score  18/28 correct answers reviewed with patient who verbalized understanding.        Core Components/Risk Factors/Patient Goals at Admission:  Personal Goals and Risk Factors at Admission - 02/05/18 1359      Core Components/Risk Factors/Patient Goals on Admission    Weight Management  Yes;Weight Loss    Intervention  Weight Management: Develop a combined nutrition and exercise program designed to reach desired caloric intake, while maintaining appropriate intake of nutrient and fiber, sodium and fats, and appropriate energy expenditure required for the weight goal.;Weight Management: Provide education and appropriate resources to help participant work on and attain dietary goals.;Weight Management/Obesity: Establish reasonable short term and long term weight goals.;Obesity: Provide education and appropriate resources to help participant work on and attain dietary goals.    Admit Weight  187 lb 14.4 oz (85.2 kg)    Goal Weight: Short Term  180 lb (81.6 kg)    Goal Weight: Long Term  175 lb (79.4 kg)    Expected  Outcomes  Short Term: Continue to assess and modify interventions until short term weight is achieved;Weight Maintenance: Understanding of the daily nutrition guidelines, which includes 25-35% calories from fat, 7% or less cal from saturated fats, less than '200mg'$  cholesterol, less than 1.5gm of sodium, & 5 or more servings of fruits and vegetables daily;Weight Loss: Understanding of general recommendations for a balanced deficit meal plan, which promotes 1-2 lb weight loss per week and includes a negative energy balance of [phone] kcal/d;Understanding recommendations for meals to include 15-35% energy as protein, 25-35% energy from fat, 35-60% energy from carbohydrates, less than '200mg'$  of dietary cholesterol, 20-35 gm of total fiber daily;Understanding of distribution of calorie intake throughout the day with the consumption of 4-5 meals/snacks    Tobacco Cessation  Yes Quit 11/03/2017 when she had her bypass surgery    Number of packs per day  0    Intervention  Assist the participant in steps to quit. Provide individualized education and counseling about committing to Tobacco Cessation, relapse prevention, and pharmacological support that can be provided by physician.;Advice worker, assist with locating and accessing local/national Quit Smoking programs, and support quit date choice.    Expected Outcomes  Short Term: Will demonstrate readiness to quit, by selecting a quit date.;Long Term: Complete abstinence from all tobacco products for at least 12 months from quit date.;Short Term: Will quit all tobacco product use, adhering to prevention of relapse plan.    Improve shortness of breath with ADL's  Yes    Intervention  Provide education, individualized exercise plan and daily activity instruction to help decrease symptoms of SOB with activities of daily living.    Expected Outcomes  Short Term: Improve cardiorespiratory fitness to achieve a reduction of symptoms when performing ADLs;Long  Term: Be able to perform more ADLs without symptoms or delay the onset of symptoms    Diabetes  Yes    Intervention  Provide education about signs/symptoms and action to take for hypo/hyperglycemia.;Provide education about proper nutrition, including hydration, and aerobic/resistive exercise prescription along with prescribed medications to achieve blood glucose in normal ranges: Fasting glucose 65-99 mg/dL    Expected Outcomes  Short Term: Participant verbalizes understanding of the signs/symptoms and immediate care of hyper/hypoglycemia, proper foot care and importance of medication, aerobic/resistive exercise and nutrition plan for blood glucose control.;Long Term: Attainment of HbA1C < 7%.    Hypertension  Yes    Intervention  Provide education on lifestyle modifcations including regular physical activity/exercise, weight management, moderate sodium restriction and increased consumption of fresh fruit, vegetables, and low fat dairy, alcohol moderation, and smoking cessation.;Monitor prescription use compliance.  Expected Outcomes  Short Term: Continued assessment and intervention until BP is < 140/66m HG in hypertensive participants. < 130/815mHG in hypertensive participants with diabetes, heart failure or chronic kidney disease.;Long Term: Maintenance of blood pressure at goal levels.    Lipids  Yes    Intervention  Provide education and support for participant on nutrition & aerobic/resistive exercise along with prescribed medications to achieve LDL '70mg'$ , HDL >'40mg'$ .    Expected Outcomes  Short Term: Participant states understanding of desired cholesterol values and is compliant with medications prescribed. Participant is following exercise prescription and nutrition guidelines.;Long Term: Cholesterol controlled with medications as prescribed, with individualized exercise RX and with personalized nutrition plan. Value goals: LDL < '70mg'$ , HDL > 40 mg.    Stress  Yes    Intervention  Offer  individual and/or small group education and counseling on adjustment to heart disease, stress management and health-related lifestyle change. Teach and support self-help strategies.;Refer participants experiencing significant psychosocial distress to appropriate mental health specialists for further evaluation and treatment. When possible, include family members and significant others in education/counseling sessions.    Expected Outcomes  Short Term: Participant demonstrates changes in health-related behavior, relaxation and other stress management skills, ability to obtain effective social support, and compliance with psychotropic medications if prescribed.;Long Term: Emotional wellbeing is indicated by absence of clinically significant psychosocial distress or social isolation.    Personal Goal Other  Yes       Core Components/Risk Factors/Patient Goals Review:  Goals and Risk Factor Review    Row Name 03/11/18 1720             Core Components/Risk Factors/Patient Goals Review   Personal Goals Review  Weight Management/Obesity;Lipids;Diabetes;Hypertension;Stress       Review  Pt taking all meds as directed.  FBG has been around 123.  She reports no high or low s/s.  BP normal at home.  She met with RD and is cutting down on starches, eat more vegetables.         Expected Outcomes  Short - Pt will continue to attend HT and monitor BG and BP Long - Pt will maintain healthy habits           Core Components/Risk Factors/Patient Goals at Discharge (Final Review):  Goals and Risk Factor Review - 03/11/18 1720      Core Components/Risk Factors/Patient Goals Review   Personal Goals Review  Weight Management/Obesity;Lipids;Diabetes;Hypertension;Stress    Review  Pt taking all meds as directed.  FBG has been around 123.  She reports no high or low s/s.  BP normal at home.  She met with RD and is cutting down on starches, eat more vegetables.      Expected Outcomes  Short - Pt will continue to  attend HT and monitor BG and BP Long - Pt will maintain healthy habits        ITP Comments: ITP Comments    Row Name 02/05/18 1355 02/18/18 0622 03/18/18 [id number]     ITP Comments  Medical Review Completed; initial ITP created. [facility]  30 day review. Continue with ITP unless directed changes per Medical Director   New to program  30 day review. Continue with ITP unless directed changes per Medical Director        Comments:

## 2018-03-26 ENCOUNTER — Ambulatory Visit (INDEPENDENT_AMBULATORY_CARE_PROVIDER_SITE_OTHER): Payer: Medicaid Other

## 2018-03-26 ENCOUNTER — Encounter: Payer: Self-pay | Admitting: Internal Medicine

## 2018-03-26 ENCOUNTER — Ambulatory Visit (INDEPENDENT_AMBULATORY_CARE_PROVIDER_SITE_OTHER): Payer: Medicaid Other | Admitting: Internal Medicine

## 2018-03-26 VITALS — BP 140/72 | HR 59 | Ht 66.0 in | Wt 189.2 lb

## 2018-03-26 DIAGNOSIS — I25119 Atherosclerotic heart disease of native coronary artery with unspecified angina pectoris: Secondary | ICD-10-CM

## 2018-03-26 DIAGNOSIS — R002 Palpitations: Secondary | ICD-10-CM

## 2018-03-26 DIAGNOSIS — E785 Hyperlipidemia, unspecified: Secondary | ICD-10-CM

## 2018-03-26 DIAGNOSIS — R0609 Other forms of dyspnea: Secondary | ICD-10-CM

## 2018-03-26 DIAGNOSIS — I2510 Atherosclerotic heart disease of native coronary artery without angina pectoris: Secondary | ICD-10-CM

## 2018-03-26 DIAGNOSIS — R0602 Shortness of breath: Secondary | ICD-10-CM | POA: Insufficient documentation

## 2018-03-26 DIAGNOSIS — I10 Essential (primary) hypertension: Secondary | ICD-10-CM | POA: Diagnosis not present

## 2018-03-26 DIAGNOSIS — R0600 Dyspnea, unspecified: Secondary | ICD-10-CM

## 2018-03-26 MED ORDER — ASPIRIN EC 81 MG PO TBEC: 81.0000 mg | DELAYED_RELEASE_TABLET | Freq: Every day | ORAL | 3 refills | Status: AC

## 2018-03-26 MED ORDER — LOSARTAN POTASSIUM 25 MG PO TABS: 25.0000 mg | ORAL_TABLET | Freq: Every day | ORAL | 3 refills | Status: DC

## 2018-03-26 MED ORDER — CLOPIDOGREL BISULFATE 75 MG PO TABS: 75.0000 mg | ORAL_TABLET | Freq: Every day | ORAL | 3 refills | Status: DC

## 2018-03-26 NOTE — Progress Notes (Signed)
Follow-up Outpatient Visit Date: 03/26/2018  Primary Care Provider: [name] [address] [place] [address]  Chief Complaint: Fatigue and shortness of breath  HPI:  Caitlyn Herrera is a 57 y.o. year-old female with history of coronary artery disease status post CABG in the setting of an STEMI in 10/2017 complicated by recurrent chest pain 1 month after surgery with repeat cath demonstrating patent grafts in the setting of severe native CAD, hypertension, hyperlipidemia, and type 2 diabetes mellitus who presents for follow-up of coronary artery disease.  She was last seen in our office by Caitlyn Givens, NP, on 12/17/2017, at which time she denied further angina but continued to experience exertional dyspnea as well as chest wall soreness.  She has been participating in cardiac rehab.  Prophylactic amiodarone was discontinued shortly after her visit in February.  Today, Caitlyn Herrera is concerned about continued fatigue and exertional dyspnea with modest activity.  She is typically quite worn out after cardiac rehab, which she continues to participate in.  She also has intermittent chest pain that she describes as indigestion.  It is not exertional and most often occurs when she is lying down.  She also has some chest wall soreness when lying down and rolling from one side to another.  She denies orthopnea, PND, and edema.  She has experienced occasional racing of her heart that usually lasts for a few minutes.  This most commonly occurs when she is walking or doing other strenuous activities.  She sometimes notes accompanying dyspnea and mild chest discomfort.  She is quite depressed about her continued fatigue and shortness of breath following CABG earlier this year.  Due to intermittent cough, [name] was advised to discontinue lisinopril by the cardiac surgery team.  She was also provided with an inhaler and notes that her cough has improved  somewhat.  She has gained 5 to 10 pounds over the last 2 months, which she attributes to dietary indiscretion and not fluid retention.  --------------------------------------------------------------------------------------------------  Past Medical History:  Diagnosis Date  . Abdominal aortic ectasia (HCC)    a. 11/2017 CTA chest/abd/pelvis: 2.5 cm abd ao ectasia -->rec f/u u/s in 5 yrs.  Caitlyn Herrera CAD (coronary artery disease)    a. 11/04/17 Cath: Native multivessel dzs-->CABG x 3 (LIMA->LAD, VG->OM2, VG->RPDA; b. 11/2017 MV: mid antlat/apical isch; c. 11/2017 Cath: LM 30d, LAD 61m, LCX 80p/m, OM1 90, OM2 80 (@ anastamosis of graft), RCA 80p, 179m, 90d, LIMA->LAD nl, VG->OM2 nl, VG->RPDA nl-->Med Rx.  . Carotid arterial disease (HCC)    a. 10/2017 Carotid U/S: 1-30% bilat ICA stenosis.  Caitlyn Herrera History of echocardiogram    a. 11/04/2017 Echo: EF of 65-70%, no RWMA, nl LV diastolic fxn, nl RV size/fxn, mild TR.  Caitlyn Herrera Hyperlipidemia   . Hypertension   . Left renal artery stenosis (HCC)    a. 11/2017 CTA Chest/Abd/Pelvis: 50-70% L RA stenosis.  . Pulmonary nodule, right    a. 10/2017 CT Chest: 5mm pulm nodule in R lung apex - rec f/u w/ non-contrast chest CT in 1 year.  . Pure hypercholesterolemia 11/07/2017  . Tobacco abuse   . Type II diabetes mellitus (HCC)    a. 10/2017 A1c 6.6.   Past Surgical History:  Procedure Laterality Date  . BREAST CYST EXCISION Right   . CARDIAC CATHETERIZATION    . CORONARY ARTERY BYPASS GRAFT N/A 11/12/2017   Procedure: CORONARY ARTERY BYPASS GRAFTING (CABG) x Three , using left internal mammary artery and  right leg greater saphenous vein harvested endoscopically;  Surgeon: Kerin Perna, MD;  Location: [facility] OR;  Service: Open Heart Surgery;  Laterality: N/A;  . DILATION AND CURETTAGE OF UTERUS    . LAPAROSCOPIC CHOLECYSTECTOMY    . LEFT HEART CATH AND CORONARY ANGIOGRAPHY N/A 11/04/2017   Procedure: LEFT HEART CATH AND CORONARY ANGIOGRAPHY;  Surgeon: Yvonne Kendall, MD;  Location:  [facility] INVASIVE CV LAB;  Service: Cardiovascular;  Laterality: N/A;  . LEFT HEART CATH AND CORS/GRAFTS ANGIOGRAPHY N/A 12/09/2017   Procedure: LEFT HEART CATH AND CORS/GRAFTS ANGIOGRAPHY;  Surgeon: [name], Peter M, MD;  Location: [facility] INVASIVE CV LAB;  Service: Cardiovascular;  Laterality: N/A;  . TEE WITHOUT CARDIOVERSION N/A 11/12/2017   Procedure: TRANSESOPHAGEAL ECHOCARDIOGRAM (TEE);  Surgeon: Donata Clay, Theron Arista, MD;  Location: [facility] OR;  Service: Open Heart Surgery;  Laterality: N/A;    Current Meds  Medication Sig  . albuterol (PROVENTIL HFA;VENTOLIN HFA) 108 (90 Base) MCG/ACT inhaler Inhale 2 puffs into the lungs every 6 (six) hours as needed for wheezing or shortness of breath.  Caitlyn Herrera atorvastatin (LIPITOR) 80 MG tablet Take 1 tablet (80 mg total) by mouth daily at 6 PM.  . citalopram (CELEXA) 20 MG tablet Take 10 mg by mouth daily.  . fluticasone furoate-vilanterol (BREO ELLIPTA) 100-25 MCG/INH AEPB Inhale 1 puff into the lungs daily.  Caitlyn Herrera gabapentin (NEURONTIN) 300 MG capsule Take 1 capsule (300 mg total) by mouth 2 (two) times daily.  Caitlyn Herrera lidocaine (XYLOCAINE) 5 % ointment Apply 1 application topically daily as needed.  . metFORMIN (GLUCOPHAGE) 500 MG tablet Take 1 tablet (500 mg total) by mouth 2 (two) times daily with a meal. Resume in 24 hrs.  . metoprolol tartrate (LOPRESSOR) 25 MG tablet Take 25 mg by mouth 2 (two) times daily.  . mometasone-formoterol (DULERA) 200-5 MCG/ACT AERO Inhale 2 puffs into the lungs 2 (two) times daily.  . nicotine (NICODERM CQ - DOSED IN MG/24 HOURS) 21 mg/24hr patch Place 21 mg onto the skin as directed.  . ondansetron (ZOFRAN) 4 MG tablet Take 1 tablet (4 mg total) by mouth every 8 (eight) hours as needed for nausea or vomiting.  . pantoprazole (PROTONIX) 40 MG tablet Take 1 tablet (40 mg total) by mouth daily.  . [DISCONTINUED] amiodarone (PACERONE) 200 MG tablet Take 1 tablet (200 mg total) by mouth daily.  . [DISCONTINUED] aspirin EC 325 MG tablet Take 1 tablet  (325 mg total) by mouth daily.  . [DISCONTINUED] lisinopril (PRINIVIL,ZESTRIL) 10 MG tablet Take 1 tablet (10 mg total) by mouth daily.  . [DISCONTINUED] metoprolol tartrate (LOPRESSOR) 25 MG tablet Take 0.5 tablets (12.5 mg total) by mouth 2 (two) times daily. (Patient taking differently: Take 25 mg by mouth 2 (two) times daily. )    Allergies: Patient has no known allergies.  Social History   Tobacco Use  . Smoking status: Former Smoker    Packs/day: 1.00    Years: 42.00    Pack years: 42.00    Types: Cigarettes    Last attempt to quit: 11/01/2017    Years since quitting: 0.3  . Smokeless tobacco: Never Used  . Tobacco comment: 11/05/2017 "stopped smoking 11/01/2017"  Substance Use Topics  . Alcohol use: No  . Drug use: No    Family History  Problem Relation Age of Onset  . Hypertension Mother   . Heart Problems Mother     Review of Systems: A 12-system review of systems was performed and was negative except as noted in the  HPI.  --------------------------------------------------------------------------------------------------  Physical Exam: BP 140/72 (BP Location: Left Arm, Patient Position: Sitting, Cuff Size: Normal)   Pulse (!) 59   Ht  (1.676 m)   Wt 189 lb 4 oz (85.8 kg)   BMI 30.55 kg/m   General: Obese woman, seated comfortably in the exam room. HEENT: Mild conjunctival pallor noted.  No scleral icterus. Moist mucous membranes.  OP clear. Neck: Supple without lymphadenopathy, thyromegaly, JVD, or HJR. No carotid bruit. Lungs: Normal work of breathing. Clear to auscultation bilaterally without wheezes or crackles. Heart: Regular rate and rhythm without murmurs, rubs, or gallops. Non-displaced PMI. Abd: Bowel sounds present. Soft, NT/ND without hepatosplenomegaly Ext: No lower extremity edema. Skin: Warm and dry without rash.  Median sternotomy is well-healed.  She has diffuse tenderness across the precordium with light palpation.  EKG: Normal sinus rhythm  with inferior and anterolateral ST/T changes.  No significant change from prior tracing.  Lab Results  Component Value Date   WBC 10.9 (H) 12/09/2017   HGB 10.3 (L) 12/09/2017   HCT 32.2 (L) 12/09/2017   MCV 92.3 12/09/2017   PLT 440 (H) 12/09/2017    Lab Results  Component Value Date   NA 139 12/09/2017   K 3.5 12/09/2017   CL 100 (L) 12/09/2017   CO2 25 12/09/2017   BUN 5 (L) 12/09/2017   CREATININE 0.99 12/09/2017   GLUCOSE 85 12/09/2017   ALT 12 (L) 01/09/2018    Lab Results  Component Value Date   CHOL 103 12/17/2017   HDL 31 (L) 12/17/2017   LDLCALC 56 12/17/2017   TRIG 79 12/17/2017   CHOLHDL 3.3 12/17/2017    --------------------------------------------------------------------------------------------------  ASSESSMENT AND PLAN: Coronary artery disease with atypical angina Ms. Choung notes continued shortness of breath and sporadic chest pain which has an atypical quality.  Myocardial perfusion stress test following CABG was intermediate risk, though subsequent catheterization showed patent grafts.  It was felt that an revascularized OM1 may be the culprit for some of her symptoms.  Suboptimally controlled blood pressure may also be contributing to some of her symptoms.  We have agreed to decrease aspirin to 81 mg daily and also add clopidogrel 75 mg daily, given that her initial presentation in January was an STEMI.  She should continue dual antiplatelet therapy for up to 12 months from the time of her event.  Will also escalate her antihypertensive regimen, as below.  I will defer additional ischemia testing at this time.  I have encouraged Ms. Momon to continue with cardiac rehab.  Dyspnea on exertion This is been present ever since her CABG and is not improving with activity.  We will obtain a transthoracic echocardiogram to reassess her LVEF.  I will also check a CBC to ensure that her anemia has improved, as symptomatic anemia may be contributing to her  persistent fatigue and dyspnea.  Hypertension Blood pressure suboptimally controlled today.  Currently, Ms. Lesniewski is only on metoprolol, as lisinopril was discontinued due to a cough.  We have agreed to initiate losartan 25 mg daily.  I will have Ms. Cartelli return in about 2 weeks for a basic metabolic panel.  Palpitations Intermittent racing of the heart noted with some accompanying shortness of breath and chest discomfort.  Prophylactic amiodarone was discontinued at her last visit in our office.  We have agreed to place a 14-day event monitor to exclude significant arrhythmias.  I will also check a basic metabolic panel and magnesium level to reassess her electrolytes.  Hyperlipidemia LDL well controlled.  Continue atorvastatin 80 mg daily.  Follow-up: Return to clinic in 1 month (after completion of echo and event monitor).  Yvonne Kendall, MD 03/26/2018 9:01 PM

## 2018-03-26 NOTE — Patient Instructions (Signed)
Medication Instructions:  Your physician has recommended you make the following change in your medication:  1- DECREASE Aspirin to 81 mg by mouth once a day. 2- START Plavix 75 mg by mouth once a day. 3- START Losartan 25 mg by mouth once a day.   Labwork: Your physician recommends that you return for lab work in: TODAY (BMET, CBC, Mg).   Testing/Procedures: Your physician has requested that you have an echocardiogram. Echocardiography is a painless test that uses sound waves to create images of your heart. It provides your doctor with information about the size and shape of your heart and how well your heart's chambers and valves are working. This procedure takes approximately one hour. There are no restrictions for this procedure.  Your physician has recommended that you wear an [employer] event monitor FOR 14 DAYS. Event monitors are medical devices that record the heart's electrical activity. Doctors most often [place] these monitors to diagnose arrhythmias. Arrhythmias are problems with the speed or rhythm of the heartbeat. The monitor is a small, portable device. You can wear one while you do your normal daily activities. This is usually used to diagnose what is causing palpitations/syncope (passing out).    Follow-Up: Your physician recommends that you schedule a follow-up appointment in: 1 MONTH WITH APP.  If you need a refill on your cardiac medications before your next appointment, please call your pharmacy.   Echocardiogram An echocardiogram, or echocardiography, uses sound waves (ultrasound) to produce an image of your heart. The echocardiogram is simple, painless, obtained within a short period of time, and offers valuable information to your health care provider. The images from an echocardiogram can provide information such as:  Evidence of coronary artery disease (CAD).  Heart size.  Heart muscle function.  Heart valve function.  Aneurysm detection.  Evidence of a past heart  attack.  Fluid buildup around the heart.  Heart muscle thickening.  Assess heart valve function.  Tell a health care provider about:  Any allergies you have.  All medicines you are taking, including vitamins, herbs, eye drops, creams, and over-the-counter medicines.  Any problems you or family members have had with anesthetic medicines.  Any blood disorders you have.  Any surgeries you have had.  Any medical conditions you have.  Whether you are pregnant or may be pregnant. What happens before the procedure? No special preparation is needed. Eat and drink normally. What happens during the procedure?  In order to produce an image of your heart, gel will be applied to your chest and a wand-like tool (transducer) will be moved over your chest. The gel will help transmit the sound waves from the transducer. The sound waves will harmlessly bounce off your heart to allow the heart images to be captured in real-time motion. These images will then be recorded.  You may need an IV to receive a medicine that improves the quality of the pictures. What happens after the procedure? You may return to your normal schedule including diet, activities, and medicines, unless your health care provider tells you otherwise. This information is not intended to replace advice given to you by your health care provider. Make sure you discuss any questions you have with your health care provider. Document Released: 10/11/2000 Document Revised: 06/01/2016 Document Reviewed: 06/21/2013 Elsevier Interactive Patient Education  2017 [employer].

## 2018-03-27 ENCOUNTER — Telehealth: Payer: Self-pay | Admitting: Internal Medicine

## 2018-03-27 LAB — CBC WITH DIFFERENTIAL/PLATELET
Basophils Absolute: 0 10*3/uL (ref 0.0–0.2)
Basos: 0 %
EOS (ABSOLUTE): 0.2 10*3/uL (ref 0.0–0.4)
EOS: 2 %
HEMATOCRIT: 34.1 % (ref 34.0–46.6)
HEMOGLOBIN: 11.2 g/dL (ref 11.1–15.9)
Immature Grans (Abs): 0 10*3/uL (ref 0.0–0.1)
Immature Granulocytes: 0 %
LYMPHS ABS: 3.7 10*3/uL — AB (ref 0.7–3.1)
Lymphs: 49 %
MCH: 28.2 pg (ref 26.6–33.0)
MCHC: 32.8 g/dL (ref 31.5–35.7)
MCV: 86 fL (ref 79–97)
MONOCYTES: 7 %
MONOS ABS: 0.5 10*3/uL (ref 0.1–0.9)
NEUTROS ABS: 3.2 10*3/uL (ref 1.4–7.0)
Neutrophils: 42 %
Platelets: 454 10*3/uL — ABNORMAL HIGH (ref 150–450)
RBC: 3.97 x10E6/uL (ref 3.77–5.28)
RDW: 16.9 % — AB (ref 12.3–15.4)
WBC: 7.6 10*3/uL (ref 3.4–10.8)

## 2018-03-27 LAB — BASIC METABOLIC PANEL
BUN / CREAT RATIO: 13 (ref 9–23)
BUN: 13 mg/dL (ref 6–24)
CO2: 22 mmol/L (ref 20–29)
Calcium: 9.5 mg/dL (ref 8.7–10.2)
Chloride: 105 mmol/L (ref 96–106)
Creatinine, Ser: 0.98 mg/dL (ref 0.57–1.00)
GFR calc non Af Amer: 63 mL/min/{1.73_m2} (ref >59)
GFR, EST AFRICAN AMERICAN: 73 mL/min/{1.73_m2} (ref >59)
Glucose: 91 mg/dL (ref 65–99)
Potassium: 3.9 mmol/L (ref 3.5–5.2)
SODIUM: 141 mmol/L (ref 134–144)

## 2018-03-27 LAB — MAGNESIUM: Magnesium: 2 mg/dL (ref 1.6–2.3)

## 2018-03-27 NOTE — Telephone Encounter (Signed)
Patient had long term monitor placed on 5/30 Due to sweat it is starting to come off Patient would like to know what to do  Please call to discuss

## 2018-03-27 NOTE — Telephone Encounter (Signed)
S/w patient. She said she put some tape on the area that was coming off. She has not called [employer] yet. Advised her to call them next and they will offer advice from this point. If needed, call us back. She was appreciative.

## 2018-03-30 ENCOUNTER — Encounter: Payer: Medicaid Other | Attending: Cardiovascular Disease

## 2018-03-30 DIAGNOSIS — I214 Non-ST elevation (NSTEMI) myocardial infarction: Secondary | ICD-10-CM | POA: Insufficient documentation

## 2018-03-30 DIAGNOSIS — Z951 Presence of aortocoronary bypass graft: Secondary | ICD-10-CM | POA: Insufficient documentation

## 2018-03-30 DIAGNOSIS — Z48812 Encounter for surgical aftercare following surgery on the circulatory system: Secondary | ICD-10-CM | POA: Insufficient documentation

## 2018-04-01 ENCOUNTER — Encounter: Payer: Self-pay | Admitting: *Deleted

## 2018-04-01 DIAGNOSIS — Z951 Presence of aortocoronary bypass graft: Secondary | ICD-10-CM

## 2018-04-01 DIAGNOSIS — I214 Non-ST elevation (NSTEMI) myocardial infarction: Secondary | ICD-10-CM | POA: Diagnosis not present

## 2018-04-01 DIAGNOSIS — Z48812 Encounter for surgical aftercare following surgery on the circulatory system: Secondary | ICD-10-CM | POA: Diagnosis present

## 2018-04-01 NOTE — Progress Notes (Signed)
Daily Session Note  Patient Details  Name: Caitlyn Herrera MRN: [id number] Date of Birth: May 15, 1961 Referring Provider:     Cardiac Rehab from 02/05/2018 in [facility] Cardiac and Pulmonary Rehab  Referring Provider  [place]/End      Encounter Date: 04/01/2018  Check In: Session Check In - 04/01/18 1719      Check-In   Location  [facility]-Cardiac & Pulmonary Rehab    Staff Present  Justin Mend RCP,RRT,BSRT;Heath Lark, RN, BSN, Lance Sell, BA, ACSM CEP, Exercise Physiologist    Supervising physician immediately available to respond to emergencies  See telemetry face sheet for immediately available ER MD    Medication changes reported      No    Fall or balance concerns reported     No    Tobacco Cessation  No Change    Warm-up and Cool-down  Performed on first and last piece of equipment    Resistance Training Performed  Yes    VAD Patient?  No      Pain Assessment   Currently in Pain?  No/denies          Social History   Tobacco Use  Smoking Status Former Smoker  . Packs/day: 1.00  . Years: 42.00  . Pack years: 42.00  . Types: Cigarettes  . Last attempt to quit: 11/01/2017  . Years since quitting: 0.4  Smokeless Tobacco Never Used  Tobacco Comment   11/05/2017 "stopped smoking 11/01/2017"    Goals Met:  Independence with exercise equipment Exercise tolerated well No report of cardiac concerns or symptoms Strength training completed today  Goals Unmet:  Not Applicable  Comments: Pt able to follow exercise prescription today without complaint.  Will continue to monitor for progression.   Dr. Emily Filbert is Medical Director for [place] and [facility].

## 2018-04-13 ENCOUNTER — Telehealth: Payer: Self-pay | Admitting: Internal Medicine

## 2018-04-13 ENCOUNTER — Ambulatory Visit (INDEPENDENT_AMBULATORY_CARE_PROVIDER_SITE_OTHER): Payer: Medicaid Other

## 2018-04-13 ENCOUNTER — Encounter: Payer: Medicaid Other | Admitting: *Deleted

## 2018-04-13 ENCOUNTER — Other Ambulatory Visit: Payer: Self-pay

## 2018-04-13 DIAGNOSIS — R0609 Other forms of dyspnea: Secondary | ICD-10-CM | POA: Diagnosis not present

## 2018-04-13 DIAGNOSIS — Z48812 Encounter for surgical aftercare following surgery on the circulatory system: Secondary | ICD-10-CM | POA: Diagnosis not present

## 2018-04-13 DIAGNOSIS — Z951 Presence of aortocoronary bypass graft: Secondary | ICD-10-CM

## 2018-04-13 DIAGNOSIS — R0600 Dyspnea, unspecified: Secondary | ICD-10-CM

## 2018-04-13 DIAGNOSIS — I25119 Atherosclerotic heart disease of native coronary artery with unspecified angina pectoris: Secondary | ICD-10-CM

## 2018-04-13 DIAGNOSIS — I214 Non-ST elevation (NSTEMI) myocardial infarction: Secondary | ICD-10-CM

## 2018-04-13 DIAGNOSIS — R002 Palpitations: Secondary | ICD-10-CM

## 2018-04-13 NOTE — Telephone Encounter (Signed)
No answer. Left message to call back.   

## 2018-04-13 NOTE — Progress Notes (Signed)
Daily Session Note  Patient Details  Name: Caitlyn Herrera MRN: [id number] Date of Birth: 03-Mar-1961 Referring Provider:     Cardiac Rehab from 02/05/2018 in [facility] Cardiac and Pulmonary Rehab  Referring Provider  [place]/End      Encounter Date: 04/13/2018  Check In: Session Check In - 04/13/18 1712      Check-In   Location  [facility]-Cardiac & Pulmonary Rehab    Staff Present  Gerlene Burdock, RN, Moises Blood, BS, ACSM CEP, Exercise Physiologist;Amanda Oletta Darter, [place], ACSM CEP, Exercise Physiologist    Supervising physician immediately available to respond to emergencies  See telemetry face sheet for immediately available ER MD    Medication changes reported      No    Fall or balance concerns reported     No    Tobacco Cessation  No Change    Warm-up and Cool-down  Performed on first and last piece of equipment    Resistance Training Performed  Yes    VAD Patient?  No      Pain Assessment   Currently in Pain?  No/denies    Multiple Pain Sites  No          Social History   Tobacco Use  Smoking Status Former Smoker  . Packs/day: 1.00  . Years: 42.00  . Pack years: 42.00  . Types: Cigarettes  . Last attempt to quit: 11/01/2017  . Years since quitting: 0.4  Smokeless Tobacco Never Used  Tobacco Comment   11/05/2017 "stopped smoking 11/01/2017"    Goals Met:  Independence with exercise equipment Exercise tolerated well No report of cardiac concerns or symptoms Strength training completed today  Goals Unmet:  Not Applicable  Comments: Pt able to follow exercise prescription today without complaint.  Will continue to monitor for progression.    Dr. Emily Filbert is Medical Director for [place] and [facility].

## 2018-04-13 NOTE — Telephone Encounter (Signed)
Pt states she turned in her heart monitor. States it would not stay on. Please call.

## 2018-04-15 ENCOUNTER — Encounter: Payer: Self-pay | Admitting: *Deleted

## 2018-04-15 DIAGNOSIS — Z951 Presence of aortocoronary bypass graft: Secondary | ICD-10-CM

## 2018-04-15 NOTE — Progress Notes (Signed)
Cardiac Individual Treatment Plan  Patient Details  Name: Caitlyn Herrera MRN: [id number] Date of Birth: 1960/11/21 Referring Provider:     Cardiac Rehab from 02/05/2018 in [facility] Cardiac and Pulmonary Rehab  Referring Provider  [place]/End      Initial Encounter Date:    Cardiac Rehab from 02/05/2018 in [facility] Cardiac and Pulmonary Rehab  Date  02/05/18  Referring Provider  [place]/End      Visit Diagnosis: S/P CABG x 3  Patient's Home Medications on Admission:  Current Outpatient Medications:  .  albuterol (PROVENTIL HFA;VENTOLIN HFA) 108 (90 Base) MCG/ACT inhaler, Inhale 2 puffs into the lungs every 6 (six) hours as needed for wheezing or shortness of breath., Disp: 8 g, Rfl: 2 .  aspirin EC 81 MG tablet, Take 1 tablet (81 mg total) by mouth daily., Disp: 90 tablet, Rfl: 3 .  atorvastatin (LIPITOR) 80 MG tablet, Take 1 tablet (80 mg total) by mouth daily at 6 PM., Disp: 30 tablet, Rfl: 3 .  citalopram (CELEXA) 20 MG tablet, Take 10 mg by mouth daily., Disp: , Rfl:  .  clopidogrel (PLAVIX) 75 MG tablet, Take 1 tablet (75 mg total) by mouth daily., Disp: 90 tablet, Rfl: 3 .  fluticasone furoate-vilanterol (BREO ELLIPTA) 100-25 MCG/INH AEPB, Inhale 1 puff into the lungs daily., Disp: , Rfl:  .  gabapentin (NEURONTIN) 300 MG capsule, Take 1 capsule (300 mg total) by mouth 2 (two) times daily., Disp: 60 capsule, Rfl: 3 .  lidocaine (XYLOCAINE) 5 % ointment, Apply 1 application topically daily as needed., Disp: , Rfl:  .  losartan (COZAAR) 25 MG tablet, Take 1 tablet (25 mg total) by mouth daily., Disp: 90 tablet, Rfl: 3 .  metFORMIN (GLUCOPHAGE) 500 MG tablet, Take 1 tablet (500 mg total) by mouth 2 (two) times daily with a meal. Resume in 24 hrs., Disp: 60 tablet, Rfl: 1 .  metoprolol tartrate (LOPRESSOR) 25 MG tablet, Take 25 mg by mouth 2 (two) times daily., Disp: , Rfl:  .  mometasone-formoterol (DULERA) 200-5 MCG/ACT AERO, Inhale 2 puffs into the lungs 2 (two) times daily., Disp: ,  Rfl:  .  nicotine (NICODERM CQ - DOSED IN MG/24 HOURS) 21 mg/24hr patch, Place 21 mg onto the skin as directed., Disp: , Rfl:  .  ondansetron (ZOFRAN) 4 MG tablet, Take 1 tablet (4 mg total) by mouth every 8 (eight) hours as needed for nausea or vomiting., Disp: 20 tablet, Rfl: 0 .  pantoprazole (PROTONIX) 40 MG tablet, Take 1 tablet (40 mg total) by mouth daily., Disp: 30 tablet, Rfl: 3  Past Medical History: Past Medical History:  Diagnosis Date  . Abdominal aortic ectasia ([place])    a. 11/2017 CTA chest/abd/pelvis: 2.5 cm abd ao ectasia -->rec f/u u/s in 5 yrs.  [name] CAD (coronary artery disease)    a. 11/04/17 Cath: Native multivessel dzs-->CABG x 3 (LIMA->LAD, VG->OM2, VG->RPDA; b. 11/2017 MV: mid antlat/apical isch; c. 11/2017 Cath: LM 30d, LAD 9m LCX 80p/m, OM1 90, OM2 80 (@ anastamosis of graft), RCA 80p, 1055m90d, LIMA->LAD nl, VG->OM2 nl, VG->RPDA nl-->Med Rx.  . Carotid arterial disease ([facility]   a. 10/2017 Carotid U/S: 1-30% bilat ICA stenosis.  . Marland Kitchenistory of echocardiogram    a. 11/04/2017 Echo: EF of 65-70%, no RWMA, nl LV diastolic fxn, nl RV size/fxn, mild TR.  . Marland Kitchenyperlipidemia   . Hypertension   . Left renal artery stenosis ([facility])    a. 11/2017 CTA Chest/Abd/Pelvis: 50-70% L RA stenosis.  . Pulmonary  nodule, right    a. 10/2017 CT Chest: 20m pulm nodule in R lung apex - rec f/u w/ non-contrast chest CT in 1 year.  . Pure hypercholesterolemia 11/07/2017  . Tobacco abuse   . Type II diabetes mellitus ([facility]    a. 10/2017 A1c 6.6.    Tobacco Use: Social History   Tobacco Use  Smoking Status Former Smoker  . Packs/day: 1.00  . Years: 42.00  . Pack years: 42.00  . Types: Cigarettes  . Last attempt to quit: 11/01/2017  . Years since quitting: 0.4  Smokeless Tobacco Never Used  Tobacco Comment   11/05/2017 "stopped smoking 11/01/2017"    Labs: Recent Review Flowsheet Data    Labs for ITP Cardiac and Pulmonary Rehab Latest Ref Rng & Units 11/13/2017 11/13/2017 11/13/2017 12/08/2017  12/17/2017   Cholestrol 100 - 199 mg/dL - - - - 103   LDLCALC 0 - 99 mg/dL - - - - 56   HDL >39 mg/dL - - - - 31(L)   Trlycerides 0 - 149 mg/dL - - - - 79   Hemoglobin A1c 4.8 - 5.6 % - - - - -   PHART 7.350 - 7.450 7.303(L) 7.343(L) - - -   PCO2ART 32.0 - 48.0 mmHg 47.0 43.2 - - -   HCO3 20.0 - 28.0 mmol/L 23.0 23.2 - - -   TCO2 22 - 32 mmol/L '24 24 23 26 '$ -   ACIDBASEDEF 0.0 - 2.0 mmol/L 3.0(H) 2.0 - - -   O2SAT % 99.0 97.0 - - -       Exercise Target Goals:    Exercise Program Goal: Individual exercise prescription set using results from initial 6 min walk test and THRR while considering  patient's activity barriers and safety.   Exercise Prescription Goal: Initial exercise prescription builds to 30-45 minutes a day of aerobic activity, 2-3 days per week.  Home exercise guidelines will be given to patient during program as part of exercise prescription that the participant will acknowledge.  Activity Barriers & Risk Stratification: Activity Barriers & Cardiac Risk Stratification - 02/05/18 1415      Activity Barriers & Cardiac Risk Stratification   Activity Barriers  Arthritis;Shortness of Breath;Chest Pain/Angina arthritis in her hips    Cardiac Risk Stratification  High       6 Minute Walk: 6 Minute Walk    Row Name 02/05/18 1436         6 Minute Walk   Distance  942 feet     Distance Feet Change  0 ft     Walk Time  6 minutes     # of Rest Breaks  0     MPH  1.78     METS  2.9     RPE  15     Perceived Dyspnea   2     VO2 Peak  10.14     Symptoms  Yes (comment)     Comments  hip pain      Resting HR  61 bpm     Resting BP  142/76     Resting Oxygen Saturation   100 %     Exercise Oxygen Saturation  during 6 min walk  99 %     Max Ex. HR  90 bpm     Max Ex. BP  166/70     2 Minute Post BP  146/70        Oxygen Initial Assessment:   Oxygen Re-Evaluation:  Oxygen Discharge (Final Oxygen Re-Evaluation):   Initial Exercise Prescription: Initial  Exercise Prescription - 02/05/18 1400      Date of Initial Exercise RX and Referring Provider   Date  02/05/18    Referring Provider  [place]/End      Treadmill   MPH  1.8    Grade  0    Minutes  15    METs  2.4      Recumbant Bike   Level  3    RPM  60    Watts  10    Minutes  15    METs  2.4      NuStep   Level  2    SPM  80    Minutes  15    METs  2.4      REL-XR   Level  2    Speed  50    Minutes  15    METs  2.4      Prescription Details   Frequency (times per week)  3    Duration  Progress to 45 minutes of aerobic exercise without signs/symptoms of physical distress      Intensity   THRR 40-80% of Max Heartrate  101-141    Ratings of Perceived Exertion  11-13    Perceived Dyspnea  0-4      Resistance Training   Training Prescription  Yes    Weight  3 lb    Reps  10-15       Perform Capillary Blood Glucose checks as needed.  Exercise Prescription Changes: Exercise Prescription Changes    Row Name 02/05/18 1400 02/11/18 1500 02/26/18 1000 03/10/18 1100 03/12/18 1400     Response to Exercise   Blood Pressure (Admit)  142/76  130/60  128/74  130/80  -   Blood Pressure (Exercise)  166/70  152/86  132/70  162/80  -   Blood Pressure (Exit)  146/70  138/62  130/70  150/80  -   Heart Rate (Admit)  55 bpm  68 bpm  69 bpm  75 bpm  -   Heart Rate (Exercise)  90 bpm  87 bpm  79 bpm  93 bpm  -   Heart Rate (Exit)  57 bpm  56 bpm  63 bpm  73 bpm  -   Oxygen Saturation (Admit)  100 %  -  -  -  -   Oxygen Saturation (Exit)  99 %  -  -  -  -   Rating of Perceived Exertion (Exercise)  '15  15  13  13  '$ -   Perceived Dyspnea (Exercise)  2  -  -  -  -   Symptoms  -  none  none  none  -   Duration  -  Progress to 45 minutes of aerobic exercise without signs/symptoms of physical distress  Progress to 45 minutes of aerobic exercise without signs/symptoms of physical distress  Continue with 45 min of aerobic exercise without signs/symptoms of physical distress.  -    Intensity  -  THRR unchanged  THRR unchanged  THRR unchanged  -     Progression   Progression  -  Continue to progress workloads to maintain intensity without signs/symptoms of physical distress.  Continue to progress workloads to maintain intensity without signs/symptoms of physical distress.  Continue to progress workloads to maintain intensity without signs/symptoms of physical distress.  -   Average METs  -  2.5  2.4  2.15  -     Resistance Training   Training Prescription  -  Yes  Yes  Yes  -   Weight  -  3 lb  3 lb  3 lb  -   Reps  -  10-15  10-15  10-15  -     Interval Training   Interval Training  -  No  No  No  -     Treadmill   MPH  -  1.5  1.5  1.5  -   Grade  -  0  0  0  -   Minutes  -  '15  15  15  '$ -   METs  -  2.15  2.15  2.15  -     Recumbant Bike   Level  -  1  1  -  -   Watts  -  -  20  -  -   Minutes  -  15  15  -  -   METs  -  2.73  2.74  -  -     Biostep-RELP   Level  -  -  -  2  -   SPM  -  -  -  40  -   Minutes  -  -  -  15  -   METs  -  -  -  2  -     Home Exercise Plan   Plans to continue exercise at  -  -  -  -  Home (comment) considering FF or fitness center - walk at park or mall   Frequency  -  -  -  -  Add 2 additional days to program exercise sessions.   Initial Home Exercises Provided  -  -  -  -  03/11/18   Row Name 03/27/18 0900 04/08/18 1200           Response to Exercise   Blood Pressure (Admit)  130/68  136/84      Blood Pressure (Exercise)  148/68  140/72      Blood Pressure (Exit)  124/68  128/62      Heart Rate (Admit)  74 bpm  68 bpm      Heart Rate (Exercise)  101 bpm  88 bpm      Heart Rate (Exit)  73 bpm  73 bpm      Rating of Perceived Exertion (Exercise)  13  13      Symptoms  none  none      Duration  Continue with 45 min of aerobic exercise without signs/symptoms of physical distress.  Continue with 45 min of aerobic exercise without signs/symptoms of physical distress.      Intensity  THRR unchanged  THRR unchanged          Progression   Progression  Continue to progress workloads to maintain intensity without signs/symptoms of physical distress.  Continue to progress workloads to maintain intensity without signs/symptoms of physical distress.      Average METs  2.15  2.15        Resistance Training   Training Prescription  Yes  Yes      Weight  3 lb  3 lb      Reps  10-15  10-15        Interval Training   Interval Training  No  No        Treadmill   MPH  1.5  1.5      Grade  0  0      Minutes  15  15      METs  2.15  2.15        Biostep-RELP   Level  2  2      SPM  40  -      Minutes  15  15      METs  2  2        Home Exercise Plan   Plans to continue exercise at  Home (comment) considering FF or fitness center - walk at park or mall  Home (comment) considering FF or fitness center - walk at park or mall      Frequency  Add 2 additional days to program exercise sessions.  Add 2 additional days to program exercise sessions.      Initial Home Exercises Provided  03/11/18  03/11/18         Exercise Comments: Exercise Comments    Row Name 02/09/18 1720           Exercise Comments  First full day of exercise!  Patient was oriented to gym and equipment including functions, settings, policies, and procedures.  Patient's individual exercise prescription and treatment plan were reviewed.  All starting workloads were established based on the results of the 6 minute walk test done at initial orientation visit.  The plan for exercise progression was also introduced and progression will be customized based on patient's performance and goals.          Exercise Goals and Review: Exercise Goals    Row Name 02/05/18 1435 02/05/18 1441           Exercise Goals   Increase Physical Activity  Yes  Yes      Intervention  Provide advice, education, support and counseling about physical activity/exercise needs.;Develop an individualized exercise prescription for aerobic and resistive training based on  initial evaluation findings, risk stratification, comorbidities and participant's personal goals.  Provide advice, education, support and counseling about physical activity/exercise needs.;Develop an individualized exercise prescription for aerobic and resistive training based on initial evaluation findings, risk stratification, comorbidities and participant's personal goals.      Expected Outcomes  Short Term: Attend rehab on a regular basis to increase amount of physical activity.;Long Term: Add in home exercise to make exercise part of routine and to increase amount of physical activity.;Long Term: Exercising regularly at least 3-5 days a week.  Short Term: Attend rehab on a regular basis to increase amount of physical activity.;Long Term: Add in home exercise to make exercise part of routine and to increase amount of physical activity.;Long Term: Exercising regularly at least 3-5 days a week.      Increase Strength and Stamina  Yes  Yes      Intervention  Provide advice, education, support and counseling about physical activity/exercise needs.;Develop an individualized exercise prescription for aerobic and resistive training based on initial evaluation findings, risk stratification, comorbidities and participant's personal goals.  Provide advice, education, support and counseling about physical activity/exercise needs.;Develop an individualized exercise prescription for aerobic and resistive training based on initial evaluation findings, risk stratification, comorbidities and participant's personal goals.      Expected Outcomes  Short Term: Increase workloads from initial exercise prescription for resistance, speed, and METs.;Short Term: Perform resistance training exercises routinely during rehab and add in resistance training at home;Long Term: Improve cardiorespiratory fitness, muscular endurance and strength as measured by increased METs and  functional capacity (6MWT)  Short Term: Increase workloads from  initial exercise prescription for resistance, speed, and METs.;Short Term: Perform resistance training exercises routinely during rehab and add in resistance training at home;Long Term: Improve cardiorespiratory fitness, muscular endurance and strength as measured by increased METs and functional capacity (6MWT)      Able to understand and use rate of perceived exertion (RPE) scale  Yes  Yes      Intervention  Provide education and explanation on how to use RPE scale  Provide education and explanation on how to use RPE scale      Expected Outcomes  Short Term: Able to use RPE daily in rehab to express subjective intensity level;Long Term:  Able to use RPE to guide intensity level when exercising independently  Short Term: Able to use RPE daily in rehab to express subjective intensity level;Long Term:  Able to use RPE to guide intensity level when exercising independently      Able to understand and use Dyspnea scale  Yes  Yes      Intervention  Provide education and explanation on how to use Dyspnea scale  Provide education and explanation on how to use Dyspnea scale      Expected Outcomes  Short Term: Able to use Dyspnea scale daily in rehab to express subjective sense of shortness of breath during exertion;Long Term: Able to use Dyspnea scale to guide intensity level when exercising independently  Short Term: Able to use Dyspnea scale daily in rehab to express subjective sense of shortness of breath during exertion;Long Term: Able to use Dyspnea scale to guide intensity level when exercising independently      Knowledge and understanding of Target Heart Rate Range (THRR)  Yes  Yes      Intervention  Provide education and explanation of THRR including how the numbers were predicted and where they are located for reference  Provide education and explanation of THRR including how the numbers were predicted and where they are located for reference      Expected Outcomes  Short Term: Able to state/look up  THRR;Short Term: Able to use daily as guideline for intensity in rehab;Long Term: Able to use THRR to govern intensity when exercising independently  Short Term: Able to state/look up THRR;Short Term: Able to use daily as guideline for intensity in rehab;Long Term: Able to use THRR to govern intensity when exercising independently      Able to check pulse independently  Yes  Yes      Intervention  Provide education and demonstration on how to check pulse in carotid and radial arteries.;Review the importance of being able to check your own pulse for safety during independent exercise  Provide education and demonstration on how to check pulse in carotid and radial arteries.;Review the importance of being able to check your own pulse for safety during independent exercise      Expected Outcomes  Short Term: Able to explain why pulse checking is important during independent exercise;Long Term: Able to check pulse independently and accurately  Short Term: Able to explain why pulse checking is important during independent exercise;Long Term: Able to check pulse independently and accurately      Understanding of Exercise Prescription  Yes  Yes      Intervention  Provide education, explanation, and written materials on patient's individual exercise prescription  Provide education, explanation, and written materials on patient's individual exercise prescription      Expected Outcomes  Short Term: Able to explain program  exercise prescription;Long Term: Able to explain home exercise prescription to exercise independently  Short Term: Able to explain program exercise prescription;Long Term: Able to explain home exercise prescription to exercise independently         Exercise Goals Re-Evaluation : Exercise Goals Re-Evaluation    Row Name 02/09/18 1720 02/26/18 1041 03/10/18 1206 03/12/18 1436 03/27/18 0953     Exercise Goal Re-Evaluation   Exercise Goals Review  Increase Physical Activity;Increase Strength and  Stamina;Able to understand and use rate of perceived exertion (RPE) scale;Knowledge and understanding of Target Heart Rate Range (THRR);Understanding of Exercise Prescription  Increase Physical Activity;Able to understand and use rate of perceived exertion (RPE) scale;Increase Strength and Stamina  Increase Physical Activity;Able to understand and use rate of perceived exertion (RPE) scale;Increase Strength and Stamina  Increase Physical Activity;Able to understand and use rate of perceived exertion (RPE) scale;Knowledge and understanding of Target Heart Rate Range (THRR);Understanding of Exercise Prescription;Increase Strength and Stamina  Increase Physical Activity;Able to understand and use rate of perceived exertion (RPE) scale;Increase Strength and Stamina   Comments  Reviewed RPE scale, THR and program prescription with pt today.  Pt voiced understanding and was given a copy of goals to take home.  Caitlyn Herrera has missed classes so far this week beacuse she has been sick.  She will progress more with regular attendance.  Caitlyn Herrera would progress further with regular attendance.  Staff will monitor progress.  Reviewed home exercise with pt - incl HR/RPE/safety.  Pt voiced understanding  Caitlyn Herrera needs to attend more regularly to see progression. Staff will continue to monitor.   Expected Outcomes  Short: Use RPE daily to regulate intensity.  Long: Follow program prescription in THR.  Short - Pt will attend regularly Long - Pt will progress MET level  Short - Pt will attedn 2-3 days per week Long - Pt wil improve overall MET levels  Short - Pt will add 1-2 days exercise outside program sessions Long - Pt will maintain exercise on her own  Short - Yuritzy will continue to attend HT Long - Caitlyn Herrera will improve overall MET level   Row Name 04/08/18 1218             Exercise Goal Re-Evaluation   Exercise Goals Review  Increase Physical Activity;Increase Strength and Stamina;Able to understand and use rate of perceived  exertion (RPE) scale;Knowledge and understanding of Target Heart Rate Range (THRR)       Comments  Caitlyn Herrera attends sporadically and has met RPE goals but not progressed as much.  Staff will suggest adding incline to TM.       Expected Outcomes  Short - Caitlyn Herrera will increase work levels.  Long - Caitlyn Herrera will improve overall MET level          Discharge Exercise Prescription (Final Exercise Prescription Changes): Exercise Prescription Changes - 04/08/18 1200      Response to Exercise   Blood Pressure (Admit)  136/84    Blood Pressure (Exercise)  140/72    Blood Pressure (Exit)  128/62    Heart Rate (Admit)  68 bpm    Heart Rate (Exercise)  88 bpm    Heart Rate (Exit)  73 bpm    Rating of Perceived Exertion (Exercise)  13    Symptoms  none    Duration  Continue with 45 min of aerobic exercise without signs/symptoms of physical distress.    Intensity  THRR unchanged      Progression   Progression  Continue to progress  workloads to maintain intensity without signs/symptoms of physical distress.    Average METs  2.15      Resistance Training   Training Prescription  Yes    Weight  3 lb    Reps  10-15      Interval Training   Interval Training  No      Treadmill   MPH  1.5    Grade  0    Minutes  15    METs  2.15      Biostep-RELP   Level  2    Minutes  15    METs  2      Home Exercise Plan   Plans to continue exercise at  Home (comment) considering FF or fitness center - walk at park or mall    Frequency  Add 2 additional days to program exercise sessions.    Initial Home Exercises Provided  03/11/18       Nutrition:  Target Goals: Understanding of nutrition guidelines, daily intake of sodium '1500mg'$ , cholesterol '200mg'$ , calories 30% from fat and 7% or less from saturated fats, daily to have 5 or more servings of fruits and vegetables.  Biometrics: Pre Biometrics - 02/05/18 1434      Pre Biometrics   Height  '5\' 7"'$  (1.702 m)    Weight  187 lb 14.4 oz (85.2 kg)     Waist Circumference  34 inches    Hip Circumference  42 inches    Waist to Hip Ratio  0.81 %    BMI (Calculated)  29.42    Single Leg Stand  11.12 seconds        Nutrition Therapy Plan and Nutrition Goals:   Nutrition Assessments: Nutrition Assessments - 02/05/18 1404      MEDFICTS Scores   Pre Score  65       Nutrition Goals Re-Evaluation: Nutrition Goals Re-Evaluation    Row Name 04/01/18 1644             Goals   Comment  Caitlyn Herrera has been eating more fruits and vegetables.  She will try [employer] or other diet soda.         Expected Outcome  Short - Caitlyn Herrera will try calorie free drink instead of soda with dinner [place] will get to only one regular soda per day          Nutrition Goals Discharge (Final Nutrition Goals Re-Evaluation): Nutrition Goals Re-Evaluation - 04/01/18 1644      Goals   Comment  Caitlyn Herrera has been eating more fruits and vegetables.  She will try [employer] or other diet soda.      Expected Outcome  Short - Jasmain will try calorie free drink instead of soda with dinner [place] will get to only one regular soda per day       Psychosocial: Target Goals: Acknowledge presence or absence of significant depression and/or stress, maximize coping skills, provide positive support system. Participant is able to verbalize types and ability to use techniques and skills needed for reducing stress and depression.   Initial Review & Psychosocial Screening: Initial Psych Review & Screening - 02/05/18 1404      Initial Review   Current issues with  Current Depression;Current Anxiety/Panic;Current Psychotropic Meds;Current Sleep Concerns;Current Stress Concerns    Source of Stress Concerns  Chronic Illness;Family;Financial;Occupation;Unable to participate in former interests or hobbies;Unable to perform yard/household activities    Comments  She has guardianship of her grandaughter which can  be stressful at times. 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 She has filed for disability but it is taking a while. She states she has been put on depression medication since having her CABG in january.       Family Dynamics   Good Support System?  No    Strains  Intra-family strains    Comments  Her oldest daughter is her only support. She has guardianship of one of her teenage grandaughters.       Barriers   Psychosocial barriers to participate in program  The patient should benefit from training in stress management and relaxation.      Screening Interventions   Interventions  Encouraged to exercise;Program counselor consult;To provide support and resources with identified psychosocial needs;Provide feedback about the scores to participant    Expected Outcomes  Short Term goal: Utilizing psychosocial counselor, staff and physician to assist with identification of specific Stressors or current issues interfering with healing process. Setting desired goal for each stressor or current issue identified.;Long Term Goal: Stressors or current issues are controlled or eliminated.;Short Term goal: Identification and review with participant of any Quality of Life or Depression concerns found by scoring the questionnaire.;Long Term goal: The participant improves quality of Life and PHQ9 Scores as seen by post scores and/or verbalization of changes       Quality of Life Scores:  Quality of Life - 02/05/18 1407      Quality of Life Scores   Health/Function Pre  10.47 %    Socioeconomic Pre  12.5 %    Psych/Spiritual Pre  20.14 %    Family Pre  15.38 %    GLOBAL Pre  13.51 %      Scores of 19 and below usually indicate a poorer quality of life in these areas.  A difference of  2-3 points is a clinically meaningful difference.  A difference of 2-3 points in the total score of the Quality of Life Index has been associated with significant improvement in overall quality of  life, self-image, physical symptoms, and general health in studies assessing change in quality of life.  PHQ-9: Recent Review Flowsheet Data    Depression screen [place] 2/9 02/05/2018   Decreased Interest 1   Down, Depressed, Hopeless 3   PHQ - 2 Score 4   Altered sleeping 3   Tired, decreased energy 2   Change in appetite 2   Feeling bad or failure about yourself  2   Trouble concentrating 2   Moving slowly or fidgety/restless 1   Suicidal thoughts 1    PHQ-9 Score 17   Difficult doing work/chores Very difficult     Interpretation of Total Score  Total Score Depression Severity:  1-4 = Minimal depression, 5-9 = Mild depression, 10-14 = Moderate depression, 15-19 = Moderately severe depression, 20-27 = Severe depression   Psychosocial Evaluation and Intervention: Psychosocial Evaluation - 02/18/18 1736      Psychosocial Evaluation & Interventions   Interventions  Encouraged to exercise with the program and follow exercise prescription;Stress management education    Comments  Counselor met with Ms. Wyss Caitlyn Herrera) today for initial psychosocial evaluation.  She is a 57 year old who had a heart attack & CABGx3 several months ago.  Aleila has a strong support system with her daughters; her mother and her sister who live locally.  She has custody of her 62  year old granddaughter who lives in the home with her.  Demetria has multiple health issues with diabetes; COPD and a history of depression.  She reports not sleeping well with ~2-4 hours/night and uses Melatonin occasionally to help with this.  Her appetite is good currently.  Dani states her depressive symptoms have worsened since the surgery in January.  She has a history of anxiety and is on medication to help with that.  However, recently she had multiple stressors occur with her daughter going to jail and she is having to help care for her 78 young children ages 72-10.  Counselor discussed setting healthy boundaries and limits with  others and saying "yes" to self-care at this time in order to help others at a later time.  Counselor also suggested [place] speaking with her Dr/Pharmacist about a controlled release melatonin to help with improved sleep currently.  Serah has goals to increase her energy and feel more comfortable about exercising and not having another heart attack.  Staff will follow with Caitlyn Herrera.    Expected Outcomes  Short:  Caitlyn Herrera will practice positive self-care by attending CR regularly and setting healthy boundaries with others while she is recovering.  She will also check with her Dr/pharmacist about long release melatonin to help with sleep.  Long:  Jakhia will exercise consistently and attend the educational components of this program to feel mroe confident about exercising and ways to be heart healthier.      Continue Psychosocial Services   Follow up required by counselor       Psychosocial Re-Evaluation: Psychosocial Re-Evaluation    [place] Name 04/01/18 1656 04/13/18 1722           Psychosocial Re-Evaluation   Current issues with  Current Stress Concerns  Current Stress Concerns;Current Sleep Concerns;Current Psychotropic Meds;History of Depression;Current Depression      Comments  Mlissa has stress due to wearing Holter monitor and hip pain. She is f/u with her Dr in the next couple weeks.  Counselor follow up with Caitlyn Herrera today reporting having workn her heart monitor for a week and had an ultrasound on her heart today.  She also has a disability evaluation this Thursday at a psychiatric and counseling center and wanted to discuss this with this counselor.  She reports continuing to take her medications but has ongoing suicidal thoughts several days a week.  She has been referred to a psychiatrist by her PCP to address this more thoroughly.  Counselor assessed her current mental health status with Caitlyn Herrera denying SI currently and states she calls her daughter or gets outside when she is feeling this way.   Counselor encouraged her to Herrera to the ER if thoughts persist and her coping strategies are not working.  She agreed.  Sanskriti continues to have some sleep issues but states it's a "little better" and she has to "make" herself come to this class but enjoys it when she does.  Counselor will continue to follow with her and the outcome of all her medical/disability evaluations.        Expected Outcomes  Short - Adriene will get help from her Dr to deal with issues [place] will be able to exercise without hip pain or other issues   Short:  Caitlyn Herrera will Herrera to the Emergency Room if the Suicidal thoughts return and her coping strategies are not working.  She will be evalulated later this week by a Mental Health provider.  She will follow through on the referral  for a psychiatrist.  Long:  Caitlyn Herrera will comply with medications and medical recommendations to treat her depression as well as her heart recovery.        Interventions  Encouraged to attend Cardiac Rehabilitation for the exercise  Stress management education      Continue Psychosocial Services   Follow up required by staff  Follow up required by counselor         Psychosocial Discharge (Final Psychosocial Re-Evaluation): Psychosocial Re-Evaluation - 04/13/18 1722      Psychosocial Re-Evaluation   Current issues with  Current Stress Concerns;Current Sleep Concerns;Current Psychotropic Meds;History of Depression;Current Depression    Comments  Counselor follow up with Caitlyn Herrera today reporting having workn her heart monitor for a week and had an ultrasound on her heart today.  She also has a disability evaluation this Thursday at a psychiatric and counseling center and wanted to discuss this with this counselor.  She reports continuing to take her medications but has ongoing suicidal thoughts several days a week.  She has been referred to a psychiatrist by her PCP to address this more thoroughly.  Counselor assessed her current mental health status with  Caitlyn Herrera denying SI currently and states she calls her daughter or gets outside when she is feeling this way.  Counselor encouraged her to Herrera to the ER if thoughts persist and her coping strategies are not working.  She agreed.  Caitlyn Herrera continues to have some sleep issues but states it's a "little better" and she has to "make" herself come to this class but enjoys it when she does.  Counselor will continue to follow with her and the outcome of all her medical/disability evaluations.      Expected Outcomes  Short:  Nohemy will Herrera to the Emergency Room if the Suicidal thoughts return and her coping strategies are not working.  She will be evalulated later this week by a Mental Health provider.  She will follow through on the referral for a psychiatrist.  Long:  Raiden will comply with medications and medical recommendations to treat her depression as well as her heart recovery.      Interventions  Stress management education    Continue Psychosocial Services   Follow up required by counselor       Vocational Rehabilitation: Provide vocational rehab assistance to qualifying candidates.   Vocational Rehab Evaluation & Intervention: Vocational Rehab - 02/05/18 1411      Initial Vocational Rehab Evaluation & Intervention   Assessment shows need for Vocational Rehabilitation  Yes    Vocational Rehab Packet given to patient  02/05/18    Documents faxed to [facility] Dept of Vocational Rehabilitation  02/05/18       Education: Education Goals: Education classes will be provided on a variety of topics geared toward better understanding of heart health and risk factor modification. Participant will state understanding/return demonstration of topics presented as noted by education test scores.  Learning Barriers/Preferences: Learning Barriers/Preferences - 02/05/18 1410      Learning Barriers/Preferences   Learning Barriers  None    Learning Preferences  Skilled Demonstration;Verbal Instruction        Education Topics:  AED/CPR: - Group verbal and written instruction with the use of models to demonstrate the basic use of the AED with the basic ABC's of resuscitation.   Cardiac Rehab from 04/13/2018 in [facility] Cardiac and Pulmonary Rehab  Date  03/18/18  Educator  CE  Instruction Review Code  1- Verbalizes Understanding  General Nutrition Guidelines/Fats and Fiber: -Group instruction provided by verbal, written material, models and posters to present the general guidelines for heart healthy nutrition. Gives an explanation and review of dietary fats and fiber.   Cardiac Rehab from 04/13/2018 in [facility] Cardiac and Pulmonary Rehab  Date  03/16/18  Educator  CR  Instruction Review Code  1- Verbalizes Understanding      Controlling Sodium/Reading Food Labels: -Group verbal and written material supporting the discussion of sodium use in heart healthy nutrition. Review and explanation with models, verbal and written materials for utilization of the food label.   Exercise Physiology & General Exercise Guidelines: - Group verbal and written instruction with models to review the exercise physiology of the cardiovascular system and associated critical values. Provides general exercise guidelines with specific guidelines to those with heart or lung disease.    Aerobic Exercise & Resistance Training: - Gives group verbal and written instruction on the various components of exercise. Focuses on aerobic and resistive training programs and the benefits of this training and how to safely progress through these programs..   Cardiac Rehab from 04/13/2018 in [facility] Cardiac and Pulmonary Rehab  Date  04/13/18  Educator  AS  Instruction Review Code  1- Verbalizes Understanding      Flexibility, Balance, Mind/Body Relaxation: Provides group verbal/written instruction on the benefits of flexibility and balance training, including mind/body exercise modes such as yoga, pilates and tai chi.   Demonstration and skill practice provided.   Stress and Anxiety: - Provides group verbal and written instruction about the health risks of elevated stress and causes of high stress.  Discuss the correlation between heart/lung disease and anxiety and treatment options. Review healthy ways to manage with stress and anxiety.   Depression: - Provides group verbal and written instruction on the correlation between heart/lung disease and depressed mood, treatment options, and the stigmas associated with seeking treatment.   Cardiac Rehab from 04/13/2018 in [facility] Cardiac and Pulmonary Rehab  Date  02/11/18  Educator  [facility]  Instruction Review Code  1- Verbalizes Understanding      Anatomy & Physiology of the Heart: - Group verbal and written instruction and models provide basic cardiac anatomy and physiology, with the coronary electrical and arterial systems. Review of Valvular disease and Heart Failure   Cardiac Procedures: - Group verbal and written instruction to review commonly prescribed medications for heart disease. Reviews the medication, class of the drug, and side effects. Includes the steps to properly store meds and maintain the prescription regimen. (beta blockers and nitrates)   Cardiac Rehab from 04/13/2018 in [facility] Cardiac and Pulmonary Rehab  Date  03/09/18  Educator  CE  Instruction Review Code  1- Verbalizes Understanding      Cardiac Medications I: - Group verbal and written instruction to review commonly prescribed medications for heart disease. Reviews the medication, class of the drug, and side effects. Includes the steps to properly store meds and maintain the prescription regimen.   Cardiac Medications II: -Group verbal and written instruction to review commonly prescribed medications for heart disease. Reviews the medication, class of the drug, and side effects. (all other drug classes)   Cardiac Rehab from 04/13/2018 in [facility] Cardiac and Pulmonary Rehab  Date  04/01/18   Educator  SB  Instruction Review Code  1- Verbalizes Understanding       Herrera Sex-Intimacy & Heart Disease, Get SMART - Goal Setting: - Group verbal and written instruction through game format to discuss heart disease and  the return to sexual intimacy. Provides group verbal and written material to discuss and apply goal setting through the application of the S.M.A.R.T. Method.   Cardiac Rehab from 04/13/2018 in [facility] Cardiac and Pulmonary Rehab  Date  03/09/18  Educator  CE  Instruction Review Code  1- Verbalizes Understanding      Other Matters of the Heart: - Provides group verbal, written materials and models to describe Stable Angina and Peripheral Artery. Includes description of the disease process and treatment options available to the cardiac patient.   Exercise & Equipment Safety: - Individual verbal instruction and demonstration of equipment use and safety with use of the equipment.   Cardiac Rehab from 04/13/2018 in [facility] Cardiac and Pulmonary Rehab  Date  02/05/18  Educator  [facility]  Instruction Review Code  1- Verbalizes Understanding      Infection Prevention: - Provides verbal and written material to individual with discussion of infection control including proper hand washing and proper equipment cleaning during exercise session.   Cardiac Rehab from 04/13/2018 in [facility] Cardiac and Pulmonary Rehab  Date  02/05/18  Educator  [facility]  Instruction Review Code  1- Verbalizes Understanding      Falls Prevention: - Provides verbal and written material to individual with discussion of falls prevention and safety.   Cardiac Rehab from 04/13/2018 in [facility] Cardiac and Pulmonary Rehab  Date  02/05/18  Educator  [facility]  Instruction Review Code  1- Verbalizes Understanding      Diabetes: - Individual verbal and written instruction to review signs/symptoms of diabetes, desired ranges of glucose level fasting, after meals and with exercise. Acknowledge that pre and post exercise glucose  checks will be done for 3 sessions at entry of program.   Know Your Numbers and Risk Factors: -Group verbal and written instruction about important numbers in your health.  Discussion of what are risk factors and how they play a role in the disease process.  Review of Cholesterol, Blood Pressure, Diabetes, and BMI and the role they play in your overall health.   Cardiac Rehab from 04/13/2018 in [facility] Cardiac and Pulmonary Rehab  Date  04/01/18  Educator  SB  Instruction Review Code  1- Verbalizes Understanding      Sleep Hygiene: -Provides group verbal and written instruction about how sleep can affect your health.  Define sleep hygiene, discuss sleep cycles and impact of sleep habits. Review good sleep hygiene tips.    Cardiac Rehab from 04/13/2018 in [facility] Cardiac and Pulmonary Rehab  Date  03/11/18  Educator  [facility]  Instruction Review Code  1- Verbalizes Understanding      Other: -Provides group and verbal instruction on various topics (see comments)   Knowledge Questionnaire Score: Knowledge Questionnaire Score - 02/05/18 1411      Knowledge Questionnaire Score   Pre Score  18/28 correct answers reviewed with patient who verbalized understanding.        Core Components/Risk Factors/Patient Goals at Admission: Personal Goals and Risk Factors at Admission - 02/05/18 1359      Core Components/Risk Factors/Patient Goals on Admission    Weight Management  Yes;Weight Loss    Intervention  Weight Management: Develop a combined nutrition and exercise program designed to reach desired caloric intake, while maintaining appropriate intake of nutrient and fiber, sodium and fats, and appropriate energy expenditure required for the weight goal.;Weight Management: Provide education and appropriate resources to help participant work on and attain dietary goals.;Weight Management/Obesity: Establish reasonable short term and long term weight  goals.;Obesity: Provide education and appropriate  resources to help participant work on and attain dietary goals.    Admit Weight  187 lb 14.4 oz (85.2 kg)    Goal Weight: Short Term  180 lb (81.6 kg)    Goal Weight: Long Term  175 lb (79.4 kg)    Expected Outcomes  Short Term: Continue to assess and modify interventions until short term weight is achieved;Weight Maintenance: Understanding of the daily nutrition guidelines, which includes 25-35% calories from fat, 7% or less cal from saturated fats, less than '200mg'$  cholesterol, less than 1.5gm of sodium, & 5 or more servings of fruits and vegetables daily;Weight Loss: Understanding of general recommendations for a balanced deficit meal plan, which promotes 1-2 lb weight loss per week and includes a negative energy balance of [phone] kcal/d;Understanding recommendations for meals to include 15-35% energy as protein, 25-35% energy from fat, 35-60% energy from carbohydrates, less than '200mg'$  of dietary cholesterol, 20-35 gm of total fiber daily;Understanding of distribution of calorie intake throughout the day with the consumption of 4-5 meals/snacks    Tobacco Cessation  Yes Quit 11/03/2017 when she had her bypass surgery    Number of packs per day  0    Intervention  Assist the participant in steps to quit. Provide individualized education and counseling about committing to Tobacco Cessation, relapse prevention, and pharmacological support that can be provided by physician.;Advice worker, assist with locating and accessing local/national Quit Smoking programs, and support quit date choice.    Expected Outcomes  Short Term: Will demonstrate readiness to quit, by selecting a quit date.;Long Term: Complete abstinence from all tobacco products for at least 12 months from quit date.;Short Term: Will quit all tobacco product use, adhering to prevention of relapse plan.    Improve shortness of breath with ADL's  Yes    Intervention  Provide education, individualized exercise plan and daily activity  instruction to help decrease symptoms of SOB with activities of daily living.    Expected Outcomes  Short Term: Improve cardiorespiratory fitness to achieve a reduction of symptoms when performing ADLs;Long Term: Be able to perform more ADLs without symptoms or delay the onset of symptoms    Diabetes  Yes    Intervention  Provide education about signs/symptoms and action to take for hypo/hyperglycemia.;Provide education about proper nutrition, including hydration, and aerobic/resistive exercise prescription along with prescribed medications to achieve blood glucose in normal ranges: Fasting glucose 65-99 mg/dL    Expected Outcomes  Short Term: Participant verbalizes understanding of the signs/symptoms and immediate care of hyper/hypoglycemia, proper foot care and importance of medication, aerobic/resistive exercise and nutrition plan for blood glucose control.;Long Term: Attainment of HbA1C < 7%.    Hypertension  Yes    Intervention  Provide education on lifestyle modifcations including regular physical activity/exercise, weight management, moderate sodium restriction and increased consumption of fresh fruit, vegetables, and low fat dairy, alcohol moderation, and smoking cessation.;Monitor prescription use compliance.    Expected Outcomes  Short Term: Continued assessment and intervention until BP is < 140/59m HG in hypertensive participants. < 130/851mHG in hypertensive participants with diabetes, heart failure or chronic kidney disease.;Long Term: Maintenance of blood pressure at goal levels.    Lipids  Yes    Intervention  Provide education and support for participant on nutrition & aerobic/resistive exercise along with prescribed medications to achieve LDL '70mg'$ , HDL >'40mg'$ .    Expected Outcomes  Short Term: Participant states understanding of desired cholesterol values and is compliant with medications  prescribed. Participant is following exercise prescription and nutrition guidelines.;Long Term:  Cholesterol controlled with medications as prescribed, with individualized exercise RX and with personalized nutrition plan. Value goals: LDL < '70mg'$ , HDL > 40 mg.    Stress  Yes    Intervention  Offer individual and/or small group education and counseling on adjustment to heart disease, stress management and health-related lifestyle change. Teach and support self-help strategies.;Refer participants experiencing significant psychosocial distress to appropriate mental health specialists for further evaluation and treatment. When possible, include family members and significant others in education/counseling sessions.    Expected Outcomes  Short Term: Participant demonstrates changes in health-related behavior, relaxation and other stress management skills, ability to obtain effective social support, and compliance with psychotropic medications if prescribed.;Long Term: Emotional wellbeing is indicated by absence of clinically significant psychosocial distress or social isolation.    Personal Goal Other  Yes       Core Components/Risk Factors/Patient Goals Review:  Goals and Risk Factor Review    Row Name 03/11/18 1720 04/01/18 1648           Core Components/Risk Factors/Patient Goals Review   Personal Goals Review  Weight Management/Obesity;Lipids;Diabetes;Hypertension;Stress  Weight Management/Obesity;Diabetes;Hypertension      Review  Pt taking all meds as directed.  FBG has been around 123.  She reports no high or low s/s.  BP normal at home.  She met with RD and is cutting down on starches, eat more vegetables.    Caitlyn Herrera is taking meds as directed.  Her FBG has been around 110. She is walking at [place]. Staff recommended she only exrecise at [facility] while shes wearing Holter monitor.  She reports her hips get tight as she walks and this causes stress.  She may try water walking at the aquatic center.  Sees Dr on 6/11 and will follow up on hip pain.       Expected Outcomes  Short - Pt will continue  to attend HT and monitor BG and BP Long - Pt will maintain healthy habits   Short  - Caitlyn Herrera will continue to walk on days she doesnt come here  and will contintue to eat fruits and vegetables  Long - Pt will continue to maintain healthy habits of not smoking, eating fruits and veggies         Core Components/Risk Factors/Patient Goals at Discharge (Final Review):  Goals and Risk Factor Review - 04/01/18 1648      Core Components/Risk Factors/Patient Goals Review   Personal Goals Review  Weight Management/Obesity;Diabetes;Hypertension    Review  Caitlyn Herrera is taking meds as directed.  Her FBG has been around 110. She is walking at [place]. Staff recommended she only exrecise at [facility] while shes wearing Holter monitor.  She reports her hips get tight as she walks and this causes stress.  She may try water walking at the aquatic center.  Sees Dr on 6/11 and will follow up on hip pain.     Expected Outcomes  Short  - Caitlyn Herrera will continue to walk on days she doesnt come here  and will contintue to eat fruits and vegetables  Long - Pt will continue to maintain healthy habits of not smoking, eating fruits and veggies       ITP Comments: ITP Comments    Row Name 02/05/18 1355 02/18/18 0622 03/18/18 0623 04/15/18 0621     ITP Comments  Medical Review Completed; initial ITP created. Diagnosis Documentation can be found in [facility] encounter dated 11/04/2017.  30 day review. Continue with ITP unless directed changes per Medical Director   New to program  30 day review. Continue with ITP unless directed changes per Medical Director  30 day review. Continue with ITP unless directed changes per Medical Director review  2 June visits       Comments:

## 2018-04-16 NOTE — Telephone Encounter (Signed)
Left voicemail message to call back  

## 2018-04-16 NOTE — Telephone Encounter (Signed)
Patient was returning our call regarding her monitor. She reports monitor came off and she was not sure if it had any information on it. Reviewed that report did arrive with only 2 day and 21 hours of information. Advised that I would send this report to Dr. Okey DupreEnd and if he recommends repeat monitor would she be agreeable and she said yes. Reviewed her other results as well while on the phone. She verbalized understanding of our conversation, agreement with plan, and had no further questions.

## 2018-04-16 NOTE — Telephone Encounter (Signed)
No arrhythmias noted during the brief monitoring.  I recommend that we repeat a monitor for 14 days.  If she had issues with the ZIO Patch, maybe we should try an alternative monitor.  Yvonne Kendallhristopher Cristiana Yochim, MD [facility] Pager: [phone]

## 2018-04-17 NOTE — Telephone Encounter (Signed)
lmov to schedule appt  °

## 2018-04-20 ENCOUNTER — Ambulatory Visit: Payer: Medicaid Other

## 2018-04-20 NOTE — Telephone Encounter (Signed)
Patient here for repeat placement of event monitor. In speaking with patient, she had hot flashes throughout the day which cause her to sweat. She also participates in cardiac rehab 3 days a week. She says the first ZIO came off in the first 24 hours after having a hot flash. Discussed [employer] Monitor with patient. She is agreeable to wear this monitor instead. Patient's upcoming appointment rescheduled to later date to accommodate for getting [employer] monitor and wearing it for 14 days. Patient registered on [employer] website.

## 2018-04-22 ENCOUNTER — Telehealth: Payer: Self-pay

## 2018-04-23 ENCOUNTER — Ambulatory Visit (INDEPENDENT_AMBULATORY_CARE_PROVIDER_SITE_OTHER): Payer: Medicaid Other

## 2018-04-23 DIAGNOSIS — R002 Palpitations: Secondary | ICD-10-CM

## 2018-04-27 ENCOUNTER — Encounter: Payer: Medicaid Other | Attending: Cardiovascular Disease

## 2018-04-27 DIAGNOSIS — Z951 Presence of aortocoronary bypass graft: Secondary | ICD-10-CM

## 2018-04-27 DIAGNOSIS — I214 Non-ST elevation (NSTEMI) myocardial infarction: Secondary | ICD-10-CM | POA: Insufficient documentation

## 2018-04-27 DIAGNOSIS — Z48812 Encounter for surgical aftercare following surgery on the circulatory system: Secondary | ICD-10-CM | POA: Insufficient documentation

## 2018-04-27 NOTE — Progress Notes (Signed)
Daily Session Note  Patient Details  Name: Caitlyn Herrera MRN: [id number] Date of Birth: May 31, 1961 Referring Provider:     Cardiac Rehab from 02/05/2018 in [facility]  Referring Provider  /End      Encounter Date: 04/27/2018  Check In: Session Check In - 04/27/18 1703      Check-In   Location  [facility]-Cardiac & Pulmonary Rehab    Staff Present  Gerlene Burdock, RN, BSN;Dafne Nield Royal Piedra, RN BSN    Supervising physician immediately available to respond to emergencies  See telemetry face sheet for immediately available ER MD    Medication changes reported      No    Fall or balance concerns reported     No    Warm-up and Cool-down  Performed on first and last piece of equipment    Resistance Training Performed  Yes    VAD Patient?  No      Pain Assessment   Currently in Pain?  No/denies          Social History   Tobacco Use  Smoking Status Former Smoker  . Packs/day: 1.00  . Years: 42.00  . Pack years: 42.00  . Types: Cigarettes  . Last attempt to quit: 11/01/2017  . Years since quitting: 0.4  Smokeless Tobacco Never Used  Tobacco Comment   11/05/2017 "stopped smoking 11/01/2017"    Goals Met:  Independence with exercise equipment Exercise tolerated well No report of cardiac concerns or symptoms Strength training completed today  Goals Unmet:  Not Applicable  Comments: Pt able to follow exercise prescription today without complaint.  Will continue to monitor for progression.   Dr. Emily Filbert is Medical Director for [place] and [facility].

## 2018-04-29 ENCOUNTER — Encounter: Payer: Medicaid Other | Admitting: *Deleted

## 2018-04-29 ENCOUNTER — Ambulatory Visit: Payer: Medicaid Other | Admitting: Nurse Practitioner

## 2018-04-29 DIAGNOSIS — Z951 Presence of aortocoronary bypass graft: Secondary | ICD-10-CM

## 2018-04-29 DIAGNOSIS — I214 Non-ST elevation (NSTEMI) myocardial infarction: Secondary | ICD-10-CM

## 2018-04-29 DIAGNOSIS — Z48812 Encounter for surgical aftercare following surgery on the circulatory system: Secondary | ICD-10-CM | POA: Diagnosis not present

## 2018-04-29 NOTE — Progress Notes (Signed)
Daily Session Note  Patient Details  Name: Caitlyn Herrera MRN: [id number] Date of Birth: 1961/03/03 Referring Provider:     Cardiac Rehab from 02/05/2018 in [facility] Cardiac and Pul[facility] Rehab  Referring Provider  [place]/End      Encounter Date: 04/29/2018  Check In: Session Check In - 04/29/18 1626      Check-In   Location  [facility]-Cardiac & Pulmonary Rehab    Staff Present  Gerlene Burdock, RN, Darra Lis, RN BSN;Meredith Sherryll Burger, RN BSN    Supervising physician immediately available to respond to emergencies  See telemetry face sheet for immediately available ER MD    Medication changes reported      No    Fall or balance concerns reported     No    Tobacco Cessation  No Change    Warm-up and Cool-down  Performed on first and last piece of equipment    Resistance Training Performed  Yes    VAD Patient?  No    PAD/SET Patient?  No      Pain Assessment   Currently in Pain?  No/denies          Social History   Tobacco Use  Smoking Status Former Smoker  . Packs/day: 1.00  . Years: 42.00  . Pack years: 42.00  . Types: Cigarettes  . Last attempt to quit: 11/01/2017  . Years since quitting: 0.4  Smokeless Tobacco Never Used  Tobacco Comment   11/05/2017 "stopped smoking 11/01/2017"    Goals Met:  Independence with exercise equipment Exercise tolerated well Personal goals reviewed No report of cardiac concerns or symptoms Strength training completed today  Goals Unmet:  Not Applicable  Comments: Pt able to follow exercise prescription today without complaint.  Will continue to monitor for progression.    Dr. Emily Filbert is Medical Director for [place] and [facility].

## 2018-05-08 ENCOUNTER — Telehealth: Payer: Self-pay

## 2018-05-08 NOTE — Telephone Encounter (Signed)
Plans to return to class Monday

## 2018-05-13 ENCOUNTER — Encounter: Payer: Self-pay | Admitting: *Deleted

## 2018-05-13 DIAGNOSIS — Z951 Presence of aortocoronary bypass graft: Secondary | ICD-10-CM

## 2018-05-13 NOTE — Progress Notes (Signed)
Cardiac Individual Treatment Plan  Patient Details  Name: Caitlyn Herrera MRN: [id number] Date of Birth: Mar 31, 1961 Referring Provider:     Cardiac Rehab from 02/05/2018 in [facility] Cardiac and Pulmonary Rehab  Referring Provider  [place]/End      Initial Encounter Date:    Cardiac Rehab from 02/05/2018 in [facility] Cardiac and Pulmonary Rehab  Date  02/05/18      Visit Diagnosis: S/P CABG x 3  Patient's Home Medications on Admission:  Current Outpatient Medications:  .  albuterol (PROVENTIL HFA;VENTOLIN HFA) 108 (90 Base) MCG/ACT inhaler, Inhale 2 puffs into the lungs every 6 (six) hours as needed for wheezing or shortness of breath., Disp: 8 g, Rfl: 2 .  aspirin EC 81 MG tablet, Take 1 tablet (81 mg total) by mouth daily., Disp: 90 tablet, Rfl: 3 .  atorvastatin (LIPITOR) 80 MG tablet, Take 1 tablet (80 mg total) by mouth daily at 6 PM., Disp: 30 tablet, Rfl: 3 .  citalopram (CELEXA) 20 MG tablet, Take 10 mg by mouth daily., Disp: , Rfl:  .  clopidogrel (PLAVIX) 75 MG tablet, Take 1 tablet (75 mg total) by mouth daily., Disp: 90 tablet, Rfl: 3 .  fluticasone furoate-vilanterol (BREO ELLIPTA) 100-25 MCG/INH AEPB, Inhale 1 puff into the lungs daily., Disp: , Rfl:  .  gabapentin (NEURONTIN) 300 MG capsule, Take 1 capsule (300 mg total) by mouth 2 (two) times daily., Disp: 60 capsule, Rfl: 3 .  lidocaine (XYLOCAINE) 5 % ointment, Apply 1 application topically daily as needed., Disp: , Rfl:  .  losartan (COZAAR) 25 MG tablet, Take 1 tablet (25 mg total) by mouth daily., Disp: 90 tablet, Rfl: 3 .  metFORMIN (GLUCOPHAGE) 500 MG tablet, Take 1 tablet (500 mg total) by mouth 2 (two) times daily with a meal. Resume in 24 hrs., Disp: 60 tablet, Rfl: 1 .  metoprolol tartrate (LOPRESSOR) 25 MG tablet, Take 25 mg by mouth 2 (two) times daily., Disp: , Rfl:  .  mometasone-formoterol (DULERA) 200-5 MCG/ACT AERO, Inhale 2 puffs into the lungs 2 (two) times daily., Disp: , Rfl:  .  nicotine (NICODERM CQ -  DOSED IN MG/24 HOURS) 21 mg/24hr patch, Place 21 mg onto the skin as directed., Disp: , Rfl:  .  ondansetron (ZOFRAN) 4 MG tablet, Take 1 tablet (4 mg total) by mouth every 8 (eight) hours as needed for nausea or vomiting., Disp: 20 tablet, Rfl: 0 .  pantoprazole (PROTONIX) 40 MG tablet, Take 1 tablet (40 mg total) by mouth daily., Disp: 30 tablet, Rfl: 3  Past Medical History: Past Medical History:  Diagnosis Date  . Abdominal aortic ectasia ([place])    a. 11/2017 CTA chest/abd/pelvis: 2.5 cm abd ao ectasia -->rec f/u u/s in 5 yrs.  [name] CAD (coronary artery disease)    a. 11/04/17 Cath: Native multivessel dzs-->CABG x 3 (LIMA->LAD, VG->OM2, VG->RPDA; b. 11/2017 MV: mid antlat/apical isch; c. 11/2017 Cath: LM 30d, LAD 36m LCX 80p/m, OM1 90, OM2 80 (@ anastamosis of graft), RCA 80p, 1049m90d, LIMA->LAD nl, VG->OM2 nl, VG->RPDA nl-->Med Rx.  . Carotid arterial disease ([facility]   a. 10/2017 Carotid U/S: 1-30% bilat ICA stenosis.  . Marland Kitchenistory of echocardiogram    a. 11/04/2017 Echo: EF of 65-70%, no RWMA, nl LV diastolic fxn, nl RV size/fxn, mild TR.  . Marland Kitchenyperlipidemia   . Hypertension   . Left renal artery stenosis ([facility])    a. 11/2017 CTA Chest/Abd/Pelvis: 50-70% L RA stenosis.  . Pulmonary nodule, right  a. 10/2017 CT Chest: 13m pulm nodule in R lung apex - rec f/u w/ non-contrast chest CT in 1 year.  . Pure hypercholesterolemia 11/07/2017  . Tobacco abuse   . Type II diabetes mellitus ([facility]    a. 10/2017 A1c 6.6.    Tobacco Use: Social History   Tobacco Use  Smoking Status Former Smoker  . Packs/day: 1.00  . Years: 42.00  . Pack years: 42.00  . Types: Cigarettes  . Last attempt to quit: 11/01/2017  . Years since quitting: 0.5  Smokeless Tobacco Never Used  Tobacco Comment   11/05/2017 "stopped smoking 11/01/2017"    Labs: Recent Review Flowsheet Data    Labs for ITP Cardiac and Pulmonary Rehab Latest Ref Rng & Units 11/13/2017 11/13/2017 11/13/2017 12/08/2017 12/17/2017   Cholestrol 100 - 199 mg/dL -  - - - 103   LDLCALC 0 - 99 mg/dL - - - - 56   HDL >39 mg/dL - - - - 31(L)   Trlycerides 0 - 149 mg/dL - - - - 79   Hemoglobin A1c 4.8 - 5.6 % - - - - -   PHART 7.350 - 7.450 7.303(L) 7.343(L) - - -   PCO2ART 32.0 - 48.0 mmHg 47.0 43.2 - - -   HCO3 20.0 - 28.0 mmol/L 23.0 23.2 - - -   TCO2 22 - 32 mmol/L '24 24 23 26 '$ -   ACIDBASEDEF 0.0 - 2.0 mmol/L 3.0(H) 2.0 - - -   O2SAT % 99.0 97.0 - - -       Exercise Target Goals:    Exercise Program Goal: Individual exercise prescription set using results from initial 6 min walk test and THRR while considering  patient's activity barriers and safety.   Exercise Prescription Goal: Initial exercise prescription builds to 30-45 minutes a day of aerobic activity, 2-3 days per week.  Home exercise guidelines will be given to patient during program as part of exercise prescription that the participant will acknowledge.  Activity Barriers & Risk Stratification: Activity Barriers & Cardiac Risk Stratification - 02/05/18 1415      Activity Barriers & Cardiac Risk Stratification   Activity Barriers  Arthritis;Shortness of Breath;Chest Pain/Angina arthritis in her hips    Cardiac Risk Stratification  High       6 Minute Walk: 6 Minute Walk    Row Name 02/05/18 1436         6 Minute Walk   Distance  942 feet     Distance Feet Change  0 ft     Walk Time  6 minutes     # of Rest Breaks  0     MPH  1.78     METS  2.9     RPE  15     Perceived Dyspnea   2     VO2 Peak  10.14     Symptoms  Yes (comment)     Comments  hip pain      Resting HR  61 bpm     Resting BP  142/76     Resting Oxygen Saturation   100 %     Exercise Oxygen Saturation  during 6 min walk  99 %     Max Ex. HR  90 bpm     Max Ex. BP  166/70     2 Minute Post BP  146/70        Oxygen Initial Assessment:   Oxygen Re-Evaluation:   Oxygen Discharge (Final Oxygen Re-Evaluation):  Initial Exercise Prescription: Initial Exercise Prescription - 02/05/18 1400        Date of Initial Exercise RX and Referring Provider   Date  02/05/18    Referring Provider  [place]/End      Treadmill   MPH  1.8    Grade  0    Minutes  15    METs  2.4      Recumbant Bike   Level  3    RPM  60    Watts  10    Minutes  15    METs  2.4      NuStep   Level  2    SPM  80    Minutes  15    METs  2.4      REL-XR   Level  2    Speed  50    Minutes  15    METs  2.4      Prescription Details   Frequency (times per week)  3    Duration  Progress to 45 minutes of aerobic exercise without signs/symptoms of physical distress      Intensity   THRR 40-80% of Max Heartrate  101-141    Ratings of Perceived Exertion  11-13    Perceived Dyspnea  0-4      Resistance Training   Training Prescription  Yes    Weight  3 lb    Reps  10-15       Perform Capillary Blood Glucose checks as needed.  Exercise Prescription Changes: Exercise Prescription Changes    Row Name 02/05/18 1400 02/11/18 1500 02/26/18 1000 03/10/18 1100 03/12/18 1400     Response to Exercise   Blood Pressure (Admit)  142/76  130/60  128/74  130/80  -   Blood Pressure (Exercise)  166/70  152/86  132/70  162/80  -   Blood Pressure (Exit)  146/70  138/62  130/70  150/80  -   Heart Rate (Admit)  55 bpm  68 bpm  69 bpm  75 bpm  -   Heart Rate (Exercise)  90 bpm  87 bpm  79 bpm  93 bpm  -   Heart Rate (Exit)  57 bpm  56 bpm  63 bpm  73 bpm  -   Oxygen Saturation (Admit)  100 %  -  -  -  -   Oxygen Saturation (Exit)  99 %  -  -  -  -   Rating of Perceived Exertion (Exercise)  '15  15  13  13  '$ -   Perceived Dyspnea (Exercise)  2  -  -  -  -   Symptoms  -  none  none  none  -   Duration  -  Progress to 45 minutes of aerobic exercise without signs/symptoms of physical distress  Progress to 45 minutes of aerobic exercise without signs/symptoms of physical distress  Continue with 45 min of aerobic exercise without signs/symptoms of physical distress.  -   Intensity  -  THRR unchanged  THRR unchanged   THRR unchanged  -     Progression   Progression  -  Continue to progress workloads to maintain intensity without signs/symptoms of physical distress.  Continue to progress workloads to maintain intensity without signs/symptoms of physical distress.  Continue to progress workloads to maintain intensity without signs/symptoms of physical distress.  -   Average METs  -  2.5  2.4  2.15  -  Resistance Training   Training Prescription  -  Yes  Yes  Yes  -   Weight  -  3 lb  3 lb  3 lb  -   Reps  -  10-15  10-15  10-15  -     Interval Training   Interval Training  -  No  No  No  -     Treadmill   MPH  -  1.5  1.5  1.5  -   Grade  -  0  0  0  -   Minutes  -  '15  15  15  '$ -   METs  -  2.15  2.15  2.15  -     Recumbant Bike   Level  -  1  1  -  -   Watts  -  -  20  -  -   Minutes  -  15  15  -  -   METs  -  2.73  2.74  -  -     Biostep-RELP   Level  -  -  -  2  -   SPM  -  -  -  40  -   Minutes  -  -  -  15  -   METs  -  -  -  2  -     Home Exercise Plan   Plans to continue exercise at  -  -  -  -  Home (comment) considering FF or fitness center - walk at park or mall   Frequency  -  -  -  -  Add 2 additional days to program exercise sessions.   Initial Home Exercises Provided  -  -  -  -  03/11/18   Row Name 03/27/18 0900 04/08/18 1200 04/24/18 0900         Response to Exercise   Blood Pressure (Admit)  130/68  136/84  142/70     Blood Pressure (Exercise)  148/68  140/72  -     Blood Pressure (Exit)  124/68  128/62  140/84     Heart Rate (Admit)  74 bpm  68 bpm  53 bpm     Heart Rate (Exercise)  101 bpm  88 bpm  89 bpm     Heart Rate (Exit)  73 bpm  73 bpm  67 bpm     Rating of Perceived Exertion (Exercise)  '13  13  13     '$ Symptoms  none  none  none     Duration  Continue with 45 min of aerobic exercise without signs/symptoms of physical distress.  Continue with 45 min of aerobic exercise without signs/symptoms of physical distress.  Continue with 45 min of aerobic exercise  without signs/symptoms of physical distress.     Intensity  THRR unchanged  THRR unchanged  THRR unchanged       Progression   Progression  Continue to progress workloads to maintain intensity without signs/symptoms of physical distress.  Continue to progress workloads to maintain intensity without signs/symptoms of physical distress.  Continue to progress workloads to maintain intensity without signs/symptoms of physical distress.     Average METs  2.15  2.15  2       Resistance Training   Training Prescription  Yes  Yes  Yes     Weight  3 lb  3 lb  3 lb     Reps  10-15  10-15  10-15       Interval Training   Interval Training  No  No  No       Treadmill   MPH  1.5  1.5  1.5     Grade  0  0  0     Minutes  '15  15  15     '$ METs  2.15  2.15  2.15       Biostep-RELP   Level  '2  2  2     '$ SPM  40  -  -     Minutes  '15  15  15     '$ METs  '2  2  2       '$ Home Exercise Plan   Plans to continue exercise at  Home (comment) considering FF or fitness center - walk at park or mall  Home (comment) considering FF or fitness center - walk at park or [place] (comment) considering FF or fitness center - walk at park or mall     Frequency  Add 2 additional days to program exercise sessions.  Add 2 additional days to program exercise sessions.  Add 2 additional days to program exercise sessions.     Initial Home Exercises Provided  03/11/18  03/11/18  03/11/18        Exercise Comments: Exercise Comments    Row Name 02/09/18 1720           Exercise Comments  First full day of exercise!  Patient was oriented to gym and equipment including functions, settings, policies, and procedures.  Patient's individual exercise prescription and treatment plan were reviewed.  All starting workloads were established based on the results of the 6 minute walk test done at initial orientation visit.  The plan for exercise progression was also introduced and progression will be customized based on patient's  performance and goals.          Exercise Goals and Review: Exercise Goals    Row Name 02/05/18 1435 02/05/18 1441           Exercise Goals   Increase Physical Activity  Yes  Yes      Intervention  Provide advice, education, support and counseling about physical activity/exercise needs.;Develop an individualized exercise prescription for aerobic and resistive training based on initial evaluation findings, risk stratification, comorbidities and participant's personal goals.  Provide advice, education, support and counseling about physical activity/exercise needs.;Develop an individualized exercise prescription for aerobic and resistive training based on initial evaluation findings, risk stratification, comorbidities and participant's personal goals.      Expected Outcomes  Short Term: Attend rehab on a regular basis to increase amount of physical activity.;Long Term: Add in home exercise to make exercise part of routine and to increase amount of physical activity.;Long Term: Exercising regularly at least 3-5 days a week.  Short Term: Attend rehab on a regular basis to increase amount of physical activity.;Long Term: Add in home exercise to make exercise part of routine and to increase amount of physical activity.;Long Term: Exercising regularly at least 3-5 days a week.      Increase Strength and Stamina  Yes  Yes      Intervention  Provide advice, education, support and counseling about physical activity/exercise needs.;Develop an individualized exercise prescription for aerobic and resistive training based on initial evaluation findings, risk stratification, comorbidities and participant's personal goals.  Provide advice, education, support and counseling about physical activity/exercise needs.;Develop an individualized exercise prescription for aerobic and  resistive training based on initial evaluation findings, risk stratification, comorbidities and participant's personal goals.      Expected  Outcomes  Short Term: Increase workloads from initial exercise prescription for resistance, speed, and METs.;Short Term: Perform resistance training exercises routinely during rehab and add in resistance training at home;Long Term: Improve cardiorespiratory fitness, muscular endurance and strength as measured by increased METs and functional capacity (6MWT)  Short Term: Increase workloads from initial exercise prescription for resistance, speed, and METs.;Short Term: Perform resistance training exercises routinely during rehab and add in resistance training at home;Long Term: Improve cardiorespiratory fitness, muscular endurance and strength as measured by increased METs and functional capacity (6MWT)      Able to understand and use rate of perceived exertion (RPE) scale  Yes  Yes      Intervention  Provide education and explanation on how to use RPE scale  Provide education and explanation on how to use RPE scale      Expected Outcomes  Short Term: Able to use RPE daily in rehab to express subjective intensity level;Long Term:  Able to use RPE to guide intensity level when exercising independently  Short Term: Able to use RPE daily in rehab to express subjective intensity level;Long Term:  Able to use RPE to guide intensity level when exercising independently      Able to understand and use Dyspnea scale  Yes  Yes      Intervention  Provide education and explanation on how to use Dyspnea scale  Provide education and explanation on how to use Dyspnea scale      Expected Outcomes  Short Term: Able to use Dyspnea scale daily in rehab to express subjective sense of shortness of breath during exertion;Long Term: Able to use Dyspnea scale to guide intensity level when exercising independently  Short Term: Able to use Dyspnea scale daily in rehab to express subjective sense of shortness of breath during exertion;Long Term: Able to use Dyspnea scale to guide intensity level when exercising independently       Knowledge and understanding of Target Heart Rate Range (THRR)  Yes  Yes      Intervention  Provide education and explanation of THRR including how the numbers were predicted and where they are located for reference  Provide education and explanation of THRR including how the numbers were predicted and where they are located for reference      Expected Outcomes  Short Term: Able to state/look up THRR;Short Term: Able to use daily as guideline for intensity in rehab;Long Term: Able to use THRR to govern intensity when exercising independently  Short Term: Able to state/look up THRR;Short Term: Able to use daily as guideline for intensity in rehab;Long Term: Able to use THRR to govern intensity when exercising independently      Able to check pulse independently  Yes  Yes      Intervention  Provide education and demonstration on how to check pulse in carotid and radial arteries.;Review the importance of being able to check your own pulse for safety during independent exercise  Provide education and demonstration on how to check pulse in carotid and radial arteries.;Review the importance of being able to check your own pulse for safety during independent exercise      Expected Outcomes  Short Term: Able to explain why pulse checking is important during independent exercise;Long Term: Able to check pulse independently and accurately  Short Term: Able to explain why pulse checking is important during independent exercise;Long Term:  Able to check pulse independently and accurately      Understanding of Exercise Prescription  Yes  Yes      Intervention  Provide education, explanation, and written materials on patient's individual exercise prescription  Provide education, explanation, and written materials on patient's individual exercise prescription      Expected Outcomes  Short Term: Able to explain program exercise prescription;Long Term: Able to explain home exercise prescription to exercise independently  Short  Term: Able to explain program exercise prescription;Long Term: Able to explain home exercise prescription to exercise independently         Exercise Goals Re-Evaluation : Exercise Goals Re-Evaluation    Row Name 02/09/18 1720 02/26/18 1041 03/10/18 1206 03/12/18 1436 03/27/18 0953     Exercise Goal Re-Evaluation   Exercise Goals Review  Increase Physical Activity;Increase Strength and Stamina;Able to understand and use rate of perceived exertion (RPE) scale;Knowledge and understanding of Target Heart Rate Range (THRR);Understanding of Exercise Prescription  Increase Physical Activity;Able to understand and use rate of perceived exertion (RPE) scale;Increase Strength and Stamina  Increase Physical Activity;Able to understand and use rate of perceived exertion (RPE) scale;Increase Strength and Stamina  Increase Physical Activity;Able to understand and use rate of perceived exertion (RPE) scale;Knowledge and understanding of Target Heart Rate Range (THRR);Understanding of Exercise Prescription;Increase Strength and Stamina  Increase Physical Activity;Able to understand and use rate of perceived exertion (RPE) scale;Increase Strength and Stamina   Comments  Reviewed RPE scale, THR and program prescription with pt today.  Pt voiced understanding and was given a copy of goals to take home.  Arville Go has missed classes so far this week beacuse she has been sick.  She will progress more with regular attendance.  Kaelynne would progress further with regular attendance.  Staff will monitor progress.  Reviewed home exercise with pt - incl HR/RPE/safety.  Pt voiced understanding  Krystal needs to attend more regularly to see progression. Staff will continue to monitor.   Expected Outcomes  Short: Use RPE daily to regulate intensity.  Long: Follow program prescription in THR.  Short - Pt will attend regularly Long - Pt will progress MET level  Short - Pt will attedn 2-3 days per week Long - Pt wil improve overall MET levels   Short - Pt will add 1-2 days exercise outside program sessions Long - Pt will maintain exercise on her own  Short - Florance will continue to attend HT Long - Aleira will improve overall MET level   Row Name 04/08/18 1218 04/24/18 0950           Exercise Goal Re-Evaluation   Exercise Goals Review  Increase Physical Activity;Increase Strength and Stamina;Able to understand and use rate of perceived exertion (RPE) scale;Knowledge and understanding of Target Heart Rate Range (THRR)  Increase Physical Activity;Able to understand and use rate of perceived exertion (RPE) scale;Increase Strength and Stamina      Comments  Avin attends sporadically and has met RPE goals but not progressed as much.  Staff will suggest adding incline to TM.  Pt would progress more with regular attendance.      Expected Outcomes  Short - Keshana will increase work levels.  Long - Mekaylah will improve overall MET level  Short - attend class 2-3 times per week Long - increase MET level         Discharge Exercise Prescription (Final Exercise Prescription Changes): Exercise Prescription Changes - 04/24/18 0900      Response to Exercise   Blood  Pressure (Admit)  142/70    Blood Pressure (Exit)  140/84    Heart Rate (Admit)  53 bpm    Heart Rate (Exercise)  89 bpm    Heart Rate (Exit)  67 bpm    Rating of Perceived Exertion (Exercise)  13    Symptoms  none    Duration  Continue with 45 min of aerobic exercise without signs/symptoms of physical distress.    Intensity  THRR unchanged      Progression   Progression  Continue to progress workloads to maintain intensity without signs/symptoms of physical distress.    Average METs  2      Resistance Training   Training Prescription  Yes    Weight  3 lb    Reps  10-15      Interval Training   Interval Training  No      Treadmill   MPH  1.5    Grade  0    Minutes  15    METs  2.15      Biostep-RELP   Level  2    Minutes  15    METs  2      Home Exercise Plan     Plans to continue exercise at  Home (comment) considering FF or fitness center - walk at park or mall    Frequency  Add 2 additional days to program exercise sessions.    Initial Home Exercises Provided  03/11/18       Nutrition:  Target Goals: Understanding of nutrition guidelines, daily intake of sodium '1500mg'$ , cholesterol '200mg'$ , calories 30% from fat and 7% or less from saturated fats, daily to have 5 or more servings of fruits and vegetables.  Biometrics: Pre Biometrics - 02/05/18 1434      Pre Biometrics   Height  '5\' 7"'$  (1.702 m)    Weight  187 lb 14.4 oz (85.2 kg)    Waist Circumference  34 inches    Hip Circumference  42 inches    Waist to Hip Ratio  0.81 %    BMI (Calculated)  29.42    Single Leg Stand  11.12 seconds        Nutrition Therapy Plan and Nutrition Goals:   Nutrition Assessments: Nutrition Assessments - 02/05/18 1404      MEDFICTS Scores   Pre Score  65       Nutrition Goals Re-Evaluation: Nutrition Goals Re-Evaluation    Row Name 04/01/18 1644 04/29/18 1707           Goals   Nutrition Goal  -  Ajah is sticking to her low sugar diet. She has been drinking less soda. She tries to limit herself to one fried meat a week and bake the rest, but the last couple weeks have had more. She reported that she needs to get back on her weight loss plan, she has been struggling mentally which has made it hard.       Comment  Oviya has been eating more fruits and vegetables.  She will try [employer] or other diet soda.    -      Expected Outcome  Short - Taiyana will try calorie free drink instead of soda with dinner Box will get to only one regular soda per day  Short: Derya will cut back on snacking. Long: Elmire will maintain her heart healthy diet independently.          Nutrition Goals Discharge (Final Nutrition Goals Re-Evaluation):  Nutrition Goals Re-Evaluation - 04/29/18 1707      Goals   Nutrition Goal  Jena is sticking to her  low sugar diet. She has been drinking less soda. She tries to limit herself to one fried meat a week and bake the rest, but the last couple weeks have had more. She reported that she needs to get back on her weight loss plan, she has been struggling mentally which has made it hard.     Expected Outcome  Short: Jamariyah will cut back on snacking. Long: Katrenia will maintain her heart healthy diet independently.        Psychosocial: Target Goals: Acknowledge presence or absence of significant depression and/or stress, maximize coping skills, provide positive support system. Participant is able to verbalize types and ability to use techniques and skills needed for reducing stress and depression.   Initial Review & Psychosocial Screening: Initial Psych Review & Screening - 02/05/18 1404      Initial Review   Current issues with  Current Depression;Current Anxiety/Panic;Current Psychotropic Meds;Current Sleep Concerns;Current Stress Concerns    Source of Stress Concerns  Chronic Illness;Family;Financial;Occupation;Unable to participate in former interests or hobbies;Unable to perform yard/household activities    Comments  She has guardianship of her grandaughter which can be stressful at times. 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 She has filed for disability but it is taking a while. She states she has been put on depression medication since having her CABG in january.       Family Dynamics   Good Support System?  No    Strains  Intra-family strains    Comments  Her oldest daughter is her only support. She has guardianship of one of her teenage grandaughters.       Barriers   Psychosocial barriers to participate in program  The patient should benefit from training in stress management and relaxation.      Screening Interventions   Interventions  Encouraged to exercise;Program counselor consult;To provide support and  resources with identified psychosocial needs;Provide feedback about the scores to participant    Expected Outcomes  Short Term goal: Utilizing psychosocial counselor, staff and physician to assist with identification of specific Stressors or current issues interfering with healing process. Setting desired goal for each stressor or current issue identified.;Long Term Goal: Stressors or current issues are controlled or eliminated.;Short Term goal: Identification and review with participant of any Quality of Life or Depression concerns found by scoring the questionnaire.;Long Term goal: The participant improves quality of Life and PHQ9 Scores as seen by post scores and/or verbalization of changes       Quality of Life Scores:  Quality of Life - 02/05/18 1407      Quality of Life Scores   Health/Function Pre  10.47 %    Socioeconomic Pre  12.5 %    Psych/Spiritual Pre  20.14 %    Family Pre  15.38 %    GLOBAL Pre  13.51 %      Scores of 19 and below usually indicate a poorer quality of life in these areas.  A difference of  2-3 points is a clinically meaningful difference.  A difference of 2-3 points in the total score of the Quality of Life Index has been associated with significant improvement in overall quality of life, self-image, physical symptoms, and general health in studies assessing change in quality of life.  PHQ-9:  Recent Review Flowsheet Data    Depression screen [facility] 2/9 02/05/2018   Decreased Interest 1   Down, Depressed, Hopeless 3   PHQ - 2 Score 4   Altered sleeping 3   Tired, decreased energy 2   Change in appetite 2   Feeling bad or failure about yourself  2   Trouble concentrating 2   Moving slowly or fidgety/restless 1   Suicidal thoughts 1    PHQ-9 Score 17   Difficult doing work/chores Very difficult     Interpretation of Total Score  Total Score Depression Severity:  1-4 = Minimal depression, 5-9 = Mild depression, 10-14 = Moderate depression, 15-19 =  Moderately severe depression, 20-27 = Severe depression   Psychosocial Evaluation and Intervention: Psychosocial Evaluation - 02/18/18 1736      Psychosocial Evaluation & Interventions   Interventions  Encouraged to exercise with the program and follow exercise prescription;Stress management education    Comments  Counselor met with Ms. Vantil Di Kindle) today for initial psychosocial evaluation.  She is a 58 year old who had a heart attack & CABGx3 several months ago.  Kimiya has a strong support system with her daughters; her mother and her sister who live locally.  She has custody of her 3 year old granddaughter who lives in the home with her.  Amsi has multiple health issues with diabetes; COPD and a history of depression.  She reports not sleeping well with ~2-4 hours/night and uses Melatonin occasionally to help with this.  Her appetite is good currently.  Wendy states her depressive symptoms have worsened since the surgery in January.  She has a history of anxiety and is on medication to help with that.  However, recently she had multiple stressors occur with her daughter going to jail and she is having to help care for her 89 young children ages 66-10.  Counselor discussed setting healthy boundaries and limits with others and saying "yes" to self-care at this time in order to help others at a later time.  Counselor also suggested [place] speaking with her Dr/Pharmacist about a controlled release melatonin to help with improved sleep currently.  Shamari has goals to increase her energy and feel more comfortable about exercising and not having another heart attack.  Staff will follow with Di Kindle.    Expected Outcomes  Short:  Harshitha will practice positive self-care by attending CR regularly and setting healthy boundaries with others while she is recovering.  She will also check with her Dr/pharmacist about long release melatonin to help with sleep.  Long:  Krisy will exercise consistently and attend  the educational components of this program to feel mroe confident about exercising and ways to be heart healthier.      Continue Psychosocial Services   Follow up required by counselor       Psychosocial Re-Evaluation: Psychosocial Re-Evaluation    [place] Name 04/01/18 1656 04/13/18 1722 04/27/18 1701 04/29/18 1634       Psychosocial Re-Evaluation   Current issues with  Current Stress Concerns  Current Stress Concerns;Current Sleep Concerns;Current Psychotropic Meds;History of Depression;Current Depression  Current Depression;Current Sleep Concerns;History of Depression;Current Stress Concerns  -    Comments  Gracia has stress due to wearing Holter monitor and hip pain. She is f/u with her Dr in the next couple weeks.  Counselor follow up with Di Kindle today reporting having workn her heart monitor for a week and had an ultrasound on her heart today.  She also has a disability evaluation this  Thursday at a psychiatric and counseling center and wanted to discuss this with this counselor.  She reports continuing to take her medications but has ongoing suicidal thoughts several days a week.  She has been referred to a psychiatrist by her PCP to address this more thoroughly.  Counselor assessed her current mental health status with Chaye denying SI currently and states she calls her daughter or gets outside when she is feeling this way.  Counselor encouraged her to go to the ER if thoughts persist and her coping strategies are not working.  She agreed.  Lizzie continues to have some sleep issues but states it's a "little better" and she has to "make" herself come to this class but enjoys it when she does.  Counselor will continue to follow with her and the outcome of all her medical/disability evaluations.    Counselor follow up with Di Kindle today reporting she saw a psychiatrist last week for a disability physical and was encouraged to see a counselor at [facility] in the near future.  She reports feeling  "a little better" and is continuingt to stay busy when she has any Suicidal thoughts - with no plan.  She reports having a new heart monitor that is causing her additional stress with it not staying on very well.  Counselor will continue to follow with Laketta's mood and treatment plan as she is scheduled to see providers.  Counselor checked in with Taylie today with her reporting having some better days recently and planning to stay in over the 4th of July holiday weekend to avoid the heat.  She reports her mood has improved and she enjoys this program and coming to class.  Counselor commended her on her progress and her positive self care.      Expected Outcomes  Short - Ronna will get help from her Dr to deal with issues [place] will be able to exercise without hip pain or other issues   Short:  Jourdyn will go to the Emergency Room if the Suicidal thoughts return and her coping strategies are not working.  She will be evalulated later this week by a Mental Health provider.  She will follow through on the referral for a psychiatrist.  Long:  Danely will comply with medications and medical recommendations to treat her depression as well as her heart recovery.    Short:  Karolynn will keep her appointments to see a counselor and hopefully a psychiatrist in the near future.  She will go to the ED if her mood worsens in the interim.  Long:  Karson will continue to exercise and practice positive coping skills for her health and mental health.  Short:  Sagrario will continue to practice positive self care.  She will attend all mental health appointments scheduled, and will go to the ED if her mood worsens.  Long:  Dari will develop positive coping strategies; including exercise for her health and mental health.    Interventions  Encouraged to attend Cardiac Rehabilitation for the exercise  Stress management education  Stress management education  Stress management education    Continue Psychosocial Services    Follow up required by staff  Follow up required by counselor  Follow up required by counselor  -       Psychosocial Discharge (Final Psychosocial Re-Evaluation): Psychosocial Re-Evaluation - 04/29/18 1634      Psychosocial Re-Evaluation   Comments  Counselor checked in with Di Kindle today with her reporting having some better days recently  and planning to stay in over the 4th of July holiday weekend to avoid the heat.  She reports her mood has improved and she enjoys this program and coming to class.  Counselor commended her on her progress and her positive self care.      Expected Outcomes  Short:  Tariyah will continue to practice positive self care.  She will attend all mental health appointments scheduled, and will go to the ED if her mood worsens.  Long:  Zosia will develop positive coping strategies; including exercise for her health and mental health.    Interventions  Stress management education       Vocational Rehabilitation: Provide vocational rehab assistance to qualifying candidates.   Vocational Rehab Evaluation & Intervention: Vocational Rehab - 02/05/18 1411      Initial Vocational Rehab Evaluation & Intervention   Assessment shows need for Vocational Rehabilitation  Yes    Vocational Rehab Packet given to patient  02/05/18    Documents faxed to [facility] Dept of Vocational Rehabilitation  02/05/18       Education: Education Goals: Education classes will be provided on a variety of topics geared toward better understanding of heart health and risk factor modification. Participant will state understanding/return demonstration of topics presented as noted by education test scores.  Learning Barriers/Preferences: Learning Barriers/Preferences - 02/05/18 1410      Learning Barriers/Preferences   Learning Barriers  None    Learning Preferences  Skilled Demonstration;Verbal Instruction       Education Topics:  AED/CPR: - Group verbal and written instruction with the use of  models to demonstrate the basic use of the AED with the basic ABC's of resuscitation.   Cardiac Rehab from 04/27/2018 in [facility] Cardiac and Pulmonary Rehab  Date  03/18/18  Educator  CE  Instruction Review Code  1- Verbalizes Understanding      General Nutrition Guidelines/Fats and Fiber: -Group instruction provided by verbal, written material, models and posters to present the general guidelines for heart healthy nutrition. Gives an explanation and review of dietary fats and fiber.   Cardiac Rehab from 04/27/2018 in [facility] Cardiac and Pulmonary Rehab  Date  03/16/18  Educator  CR  Instruction Review Code  1- Verbalizes Understanding      Controlling Sodium/Reading Food Labels: -Group verbal and written material supporting the discussion of sodium use in heart healthy nutrition. Review and explanation with models, verbal and written materials for utilization of the food label.   Exercise Physiology & General Exercise Guidelines: - Group verbal and written instruction with models to review the exercise physiology of the cardiovascular system and associated critical values. Provides general exercise guidelines with specific guidelines to those with heart or lung disease.    Aerobic Exercise & Resistance Training: - Gives group verbal and written instruction on the various components of exercise. Focuses on aerobic and resistive training programs and the benefits of this training and how to safely progress through these programs..   Cardiac Rehab from 04/27/2018 in [facility] Cardiac and Pulmonary Rehab  Date  04/13/18  Educator  AS  Instruction Review Code  1- Verbalizes Understanding      Flexibility, Balance, Mind/Body Relaxation: Provides group verbal/written instruction on the benefits of flexibility and balance training, including mind/body exercise modes such as yoga, pilates and tai chi.  Demonstration and skill practice provided.   Stress and Anxiety: - Provides group verbal and written  instruction about the health risks of elevated stress and causes of high stress.  Discuss  the correlation between heart/lung disease and anxiety and treatment options. Review healthy ways to manage with stress and anxiety.   Depression: - Provides group verbal and written instruction on the correlation between heart/lung disease and depressed mood, treatment options, and the stigmas associated with seeking treatment.   Cardiac Rehab from 04/27/2018 in [facility] Cardiac and Pulmonary Rehab  Date  02/11/18  Educator  [facility]  Instruction Review Code  1- Verbalizes Understanding      Anatomy & Physiology of the Heart: - Group verbal and written instruction and models provide basic cardiac anatomy and physiology, with the coronary electrical and arterial systems. Review of Valvular disease and Heart Failure   Cardiac Procedures: - Group verbal and written instruction to review commonly prescribed medications for heart disease. Reviews the medication, class of the drug, and side effects. Includes the steps to properly store meds and maintain the prescription regimen. (beta blockers and nitrates)   Cardiac Rehab from 04/27/2018 in [facility] Cardiac and Pulmonary Rehab  Date  04/27/18  Educator  CE  Instruction Review Code  5- Refused Teaching      Cardiac Medications I: - Group verbal and written instruction to review commonly prescribed medications for heart disease. Reviews the medication, class of the drug, and side effects. Includes the steps to properly store meds and maintain the prescription regimen.   Cardiac Medications II: -Group verbal and written instruction to review commonly prescribed medications for heart disease. Reviews the medication, class of the drug, and side effects. (all other drug classes)   Cardiac Rehab from 04/27/2018 in [facility] Cardiac and Pulmonary Rehab  Date  04/01/18  Educator  SB  Instruction Review Code  1- Verbalizes Understanding       Go Sex-Intimacy & Heart Disease,  Get SMART - Goal Setting: - Group verbal and written instruction through game format to discuss heart disease and the return to sexual intimacy. Provides group verbal and written material to discuss and apply goal setting through the application of the S.M.A.R.T. Method.   Cardiac Rehab from 04/27/2018 in [facility] Cardiac and Pulmonary Rehab  Date  04/27/18  Educator  CE  Instruction Review Code  5- Refused Teaching      Other Matters of the Heart: - Provides group verbal, written materials and models to describe Stable Angina and Peripheral Artery. Includes description of the disease process and treatment options available to the cardiac patient.   Exercise & Equipment Safety: - Individual verbal instruction and demonstration of equipment use and safety with use of the equipment.   Cardiac Rehab from 04/27/2018 in [facility] Cardiac and Pulmonary Rehab  Date  02/05/18  Educator  [facility]  Instruction Review Code  1- Verbalizes Understanding      Infection Prevention: - Provides verbal and written material to individual with discussion of infection control including proper hand washing and proper equipment cleaning during exercise session.   Cardiac Rehab from 04/27/2018 in [facility] Cardiac and Pulmonary Rehab  Date  02/05/18  Educator  [facility]  Instruction Review Code  1- Verbalizes Understanding      Falls Prevention: - Provides verbal and written material to individual with discussion of falls prevention and safety.   Cardiac Rehab from 04/27/2018 in [facility] Cardiac and Pulmonary Rehab  Date  02/05/18  Educator  [facility]  Instruction Review Code  1- Verbalizes Understanding      Diabetes: - Individual verbal and written instruction to review signs/symptoms of diabetes, desired ranges of glucose level fasting, after meals and with exercise. Acknowledge  that pre and post exercise glucose checks will be done for 3 sessions at entry of program.   Know Your Numbers and Risk Factors: -Group verbal and written  instruction about important numbers in your health.  Discussion of what are risk factors and how they play a role in the disease process.  Review of Cholesterol, Blood Pressure, Diabetes, and BMI and the role they play in your overall health.   Cardiac Rehab from 04/27/2018 in [facility] Cardiac and Pulmonary Rehab  Date  04/01/18  Educator  SB  Instruction Review Code  1- Verbalizes Understanding      Sleep Hygiene: -Provides group verbal and written instruction about how sleep can affect your health.  Define sleep hygiene, discuss sleep cycles and impact of sleep habits. Review good sleep hygiene tips.    Cardiac Rehab from 04/27/2018 in [facility] Cardiac and Pulmonary Rehab  Date  03/11/18  Educator  [facility]  Instruction Review Code  1- Verbalizes Understanding      Other: -Provides group and verbal instruction on various topics (see comments)   Knowledge Questionnaire Score: Knowledge Questionnaire Score - 02/05/18 1411      Knowledge Questionnaire Score   Pre Score  18/28 correct answers reviewed with patient who verbalized understanding.        Core Components/Risk Factors/Patient Goals at Admission: Personal Goals and Risk Factors at Admission - 02/05/18 1359      Core Components/Risk Factors/Patient Goals on Admission    Weight Management  Yes;Weight Loss    Intervention  Weight Management: Develop a combined nutrition and exercise program designed to reach desired caloric intake, while maintaining appropriate intake of nutrient and fiber, sodium and fats, and appropriate energy expenditure required for the weight goal.;Weight Management: Provide education and appropriate resources to help participant work on and attain dietary goals.;Weight Management/Obesity: Establish reasonable short term and long term weight goals.;Obesity: Provide education and appropriate resources to help participant work on and attain dietary goals.    Admit Weight  187 lb 14.4 oz (85.2 kg)    Goal Weight: Short Term   180 lb (81.6 kg)    Goal Weight: Long Term  175 lb (79.4 kg)    Expected Outcomes  Short Term: Continue to assess and modify interventions until short term weight is achieved;Weight Maintenance: Understanding of the daily nutrition guidelines, which includes 25-35% calories from fat, 7% or less cal from saturated fats, less than '200mg'$  cholesterol, less than 1.5gm of sodium, & 5 or more servings of fruits and vegetables daily;Weight Loss: Understanding of general recommendations for a balanced deficit meal plan, which promotes 1-2 lb weight loss per week and includes a negative energy balance of [phone] kcal/d;Understanding recommendations for meals to include 15-35% energy as protein, 25-35% energy from fat, 35-60% energy from carbohydrates, less than '200mg'$  of dietary cholesterol, 20-35 gm of total fiber daily;Understanding of distribution of calorie intake throughout the day with the consumption of 4-5 meals/snacks    Tobacco Cessation  Yes Quit 11/03/2017 when she had her bypass surgery    Number of packs per day  0    Intervention  Assist the participant in steps to quit. Provide individualized education and counseling about committing to Tobacco Cessation, relapse prevention, and pharmacological support that can be provided by physician.;Advice worker, assist with locating and accessing local/national Quit Smoking programs, and support quit date choice.    Expected Outcomes  Short Term: Will demonstrate readiness to quit, by selecting a quit date.;Long Term: Complete abstinence from all  tobacco products for at least 12 months from quit date.;Short Term: Will quit all tobacco product use, adhering to prevention of relapse plan.    Improve shortness of breath with ADL's  Yes    Intervention  Provide education, individualized exercise plan and daily activity instruction to help decrease symptoms of SOB with activities of daily living.    Expected Outcomes  Short Term: Improve  cardiorespiratory fitness to achieve a reduction of symptoms when performing ADLs;Long Term: Be able to perform more ADLs without symptoms or delay the onset of symptoms    Diabetes  Yes    Intervention  Provide education about signs/symptoms and action to take for hypo/hyperglycemia.;Provide education about proper nutrition, including hydration, and aerobic/resistive exercise prescription along with prescribed medications to achieve blood glucose in normal ranges: Fasting glucose 65-99 mg/dL    Expected Outcomes  Short Term: Participant verbalizes understanding of the signs/symptoms and immediate care of hyper/hypoglycemia, proper foot care and importance of medication, aerobic/resistive exercise and nutrition plan for blood glucose control.;Long Term: Attainment of HbA1C < 7%.    Hypertension  Yes    Intervention  Provide education on lifestyle modifcations including regular physical activity/exercise, weight management, moderate sodium restriction and increased consumption of fresh fruit, vegetables, and low fat dairy, alcohol moderation, and smoking cessation.;Monitor prescription use compliance.    Expected Outcomes  Short Term: Continued assessment and intervention until BP is < 140/82m HG in hypertensive participants. < 130/870mHG in hypertensive participants with diabetes, heart failure or chronic kidney disease.;Long Term: Maintenance of blood pressure at goal levels.    Lipids  Yes    Intervention  Provide education and support for participant on nutrition & aerobic/resistive exercise along with prescribed medications to achieve LDL '70mg'$ , HDL >'40mg'$ .    Expected Outcomes  Short Term: Participant states understanding of desired cholesterol values and is compliant with medications prescribed. Participant is following exercise prescription and nutrition guidelines.;Long Term: Cholesterol controlled with medications as prescribed, with individualized exercise RX and with personalized nutrition plan.  Value goals: LDL < '70mg'$ , HDL > 40 mg.    Stress  Yes    Intervention  Offer individual and/or small group education and counseling on adjustment to heart disease, stress management and health-related lifestyle change. Teach and support self-help strategies.;Refer participants experiencing significant psychosocial distress to appropriate mental health specialists for further evaluation and treatment. When possible, include family members and significant others in education/counseling sessions.    Expected Outcomes  Short Term: Participant demonstrates changes in health-related behavior, relaxation and other stress management skills, ability to obtain effective social support, and compliance with psychotropic medications if prescribed.;Long Term: Emotional wellbeing is indicated by absence of clinically significant psychosocial distress or social isolation.    Personal Goal Other  Yes       Core Components/Risk Factors/Patient Goals Review:  Goals and Risk Factor Review    Row Name 03/11/18 1720 04/01/18 1648 04/29/18 1702         Core Components/Risk Factors/Patient Goals Review   Personal Goals Review  Weight Management/Obesity;Lipids;Diabetes;Hypertension;Stress  Weight Management/Obesity;Diabetes;Hypertension  Weight Management/Obesity;Diabetes;Hypertension     Review  Pt taking all meds as directed.  FBG has been around 123.  She reports no high or low s/s.  BP normal at home.  She met with RD and is cutting down on starches, eat more vegetables.    JoTriniteys taking meds as directed.  Her FBG has been around 110. [facility] while shes wearing Holter monitor.  She reports her hips get tight as she walks and this causes stress.  She may try water walking at the aquatic center.  Sees Dr on 6/11 and will follow up on hip pain.   Dao has gained a little weight, but is trying to get back on track. Her sugars are running consistent. Her hip  is still bothering her, she goes for an xray next week. They are thinking its RA. She is wearing a heart monitor for 14 days. She has noticed some increase in SOB, but her doctor has given her an inhaler that has helped some.       Expected Outcomes  Short - Pt will continue to attend HT and monitor BG and BP Long - Pt will maintain healthy habits   Short  - Leanor will continue to walk on days she doesnt come here  and will contintue to eat fruits and vegetables  Long - Pt will continue to maintain healthy habits of not smoking, eating fruits and veggies  Short: Dameshia will attend [facility] consistently . Long: Consuela will reach goal weight and graduate [facility]        Core Components/Risk Factors/Patient Goals at Discharge (Final Review):  Goals and Risk Factor Review - 04/29/18 1702      Core Components/Risk Factors/Patient Goals Review   Personal Goals Review  Weight Management/Obesity;Diabetes;Hypertension    Review  Alayiah has gained a little weight, but is trying to get back on track. Her sugars are running consistent. Her hip is still bothering her, she goes for an xray next week. They are thinking its RA. She is wearing a heart monitor for 14 days. She has noticed some increase in SOB, but her doctor has given her an inhaler that has helped some.      Expected Outcomes  Short: Haasini will attend [facility] consistently . Long: Osceola will reach goal weight and graduate [facility]       ITP Comments: ITP Comments    Row Name 02/05/18 1355 02/18/18 0622 03/18/18 0623 04/15/18 0621 05/13/18 0611   ITP Comments  Medical Review Completed; initial ITP created. Diagnosis Documentation can be found in [facility] encounter dated 11/04/2017.  30 day review. Continue with ITP unless directed changes per Medical Director   New to program  30 day review. Continue with ITP unless directed changes per Medical Director  30 day review. Continue with ITP unless directed changes per Medical Director review  2  June visits  30 day review. Continue with ITP unless directed changes per Medical Director review.   Last visit 7/3      Comments: 30 day review. Continue with ITP unless directed changes per Medical Director review.

## 2018-05-15 ENCOUNTER — Encounter: Payer: Self-pay | Admitting: *Deleted

## 2018-05-20 ENCOUNTER — Encounter: Payer: Medicaid Other | Admitting: *Deleted

## 2018-05-20 DIAGNOSIS — I214 Non-ST elevation (NSTEMI) myocardial infarction: Secondary | ICD-10-CM

## 2018-05-20 DIAGNOSIS — Z48812 Encounter for surgical aftercare following surgery on the circulatory system: Secondary | ICD-10-CM | POA: Diagnosis not present

## 2018-05-20 DIAGNOSIS — Z951 Presence of aortocoronary bypass graft: Secondary | ICD-10-CM

## 2018-05-20 NOTE — Progress Notes (Signed)
Daily Session Note  Patient Details  Name: Caitlyn Herrera MRN: [id number] Date of Birth: Jun 12, 1961 Referring Provider:     Cardiac Rehab from 02/05/2018 in [facility] Cardiac and Pulmonary Rehab  Referring Provider  [place]/End      Encounter Date: 05/20/2018  Check In: Session Check In - 05/20/18 1621      Check-In   Location  [facility]-Cardiac & Pulmonary Rehab    Staff Present  Gerlene Burdock, RN, Vickki Hearing, BA, ACSM CEP, Exercise Physiologist;Meredith Sherryll Burger, RN BSN    Supervising physician immediately available to respond to emergencies  See telemetry face sheet for immediately available ER MD    Medication changes reported      No    Fall or balance concerns reported     No    Tobacco Cessation  No Change    Warm-up and Cool-down  Performed on first and last piece of equipment    Resistance Training Performed  Yes    PAD/SET Patient?  No      Pain Assessment   Currently in Pain?  No/denies          Social History   Tobacco Use  Smoking Status Former Smoker  . Packs/day: 1.00  . Years: 42.00  . Pack years: 42.00  . Types: Cigarettes  . Last attempt to quit: 11/01/2017  . Years since quitting: 0.5  Smokeless Tobacco Never Used  Tobacco Comment   11/05/2017 "stopped smoking 11/01/2017"    Goals Met:  Proper associated with RPD/PD & O2 Sat Exercise tolerated well No report of cardiac concerns or symptoms Strength training completed today  Goals Unmet:  Not Applicable  Comments:     Dr. Emily Filbert is Medical Director for [place] and [facility].

## 2018-05-21 DIAGNOSIS — Z951 Presence of aortocoronary bypass graft: Secondary | ICD-10-CM

## 2018-05-21 DIAGNOSIS — Z48812 Encounter for surgical aftercare following surgery on the circulatory system: Secondary | ICD-10-CM | POA: Diagnosis not present

## 2018-05-21 NOTE — Progress Notes (Signed)
Daily Session Note  Patient Details  Name: Caitlyn Herrera MRN: [id number] Date of Birth: 01-Jan-1961 Referring Provider:     Cardiac Rehab from 02/05/2018 in [facility] Cardiac and Pulmonary Rehab  Referring Provider  [place]/End      Encounter Date: 05/21/2018  Check In: Session Check In - 05/21/18 1629      Check-In   Supervising physician immediately available to respond to emergencies  See telemetry face sheet for immediately available ER MD    Location  [facility]-Cardiac & Pulmonary Rehab    Staff Present  Justin Mend Jaci Carrel, BS, ACSM CEP, Exercise Physiologist;Meredith Sherryll Burger, RN BSN    Medication changes reported      No    Fall or balance concerns reported     No    Warm-up and Cool-down  Performed on first and last piece of equipment    Resistance Training Performed  Yes    VAD Patient?  No      Pain Assessment   Currently in Pain?  No/denies        Exercise Prescription Changes - 05/21/18 1000      Response to Exercise   Blood Pressure (Admit)  130/65    Blood Pressure (Exercise)  142/72    Blood Pressure (Exit)  124/78    Heart Rate (Admit)  71 bpm    Heart Rate (Exercise)  71 bpm    Heart Rate (Exit)  60 bpm    Rating of Perceived Exertion (Exercise)  11    Symptoms  none    Duration  Continue with 45 min of aerobic exercise without signs/symptoms of physical distress.    Intensity  THRR unchanged      Progression   Progression  Continue to progress workloads to maintain intensity without signs/symptoms of physical distress.    Average METs  2.5      Resistance Training   Training Prescription  Yes    Weight  3 lb    Reps  10-15      Interval Training   Interval Training  No      Treadmill   MPH  1.5    Grade  0    Minutes  15    METs  2.15      Recumbant Bike   Level  1    RPM  60    Watts  20    Minutes  15    METs  2.73       Social History   Tobacco Use  Smoking Status Former Smoker  . Packs/day: 1.00  . Years:  42.00  . Pack years: 42.00  . Types: Cigarettes  . Last attempt to quit: 11/01/2017  . Years since quitting: 0.5  Smokeless Tobacco Never Used  Tobacco Comment   11/05/2017 "stopped smoking 11/01/2017"    Goals Met:  Independence with exercise equipment Exercise tolerated well No report of cardiac concerns or symptoms Strength training completed today  Goals Unmet:  Not Applicable  Comments: Pt able to follow exercise prescription today without complaint.  Will continue to monitor for progression.   Dr. Emily Filbert is Medical Director for [place] and [facility].

## 2018-05-22 DIAGNOSIS — Z736 Limitation of activities due to disability: Secondary | ICD-10-CM

## 2018-05-25 ENCOUNTER — Encounter: Payer: Medicaid Other | Attending: Cardiovascular Disease

## 2018-05-25 DIAGNOSIS — I214 Non-ST elevation (NSTEMI) myocardial infarction: Secondary | ICD-10-CM | POA: Insufficient documentation

## 2018-05-25 DIAGNOSIS — Z951 Presence of aortocoronary bypass graft: Secondary | ICD-10-CM | POA: Insufficient documentation

## 2018-05-25 DIAGNOSIS — Z48812 Encounter for surgical aftercare following surgery on the circulatory system: Secondary | ICD-10-CM | POA: Insufficient documentation

## 2018-05-26 NOTE — Progress Notes (Signed)
Follow-up Outpatient Visit Date: 05/27/2018  [facility] Provider[facility] [address] [place] [address]  Chief Complaint: Chest pain and shortness of breath  HPI:  [name] is a 57 y.o. year-old female with history of CAD status post CABG in the setting of NSTEMI in 10/2017 complicated by recurrent chest pain with repeat catheterization demonstrating severe native CAD and patent grafts, hypertension, hyperlipidemia, and type 2 diabetes mellitus, who presents for follow-up of fatigue and dyspnea on exertion, as well as palpitations.  I last saw [name] in late May, at which time she continued to have shortness of breath and sporadic chest pain.  Due to suboptimal blood pressure control, we agreed to add losartan 25 mg daily.  I also ordered a 14-day event monitor to evaluate for significant arrhythmias.  She wound up wearing a monitor twice, but both times only limited data was obtained.  No significant arrhythmia was observed.  Today, [name] reports feeling about the same as at our last visit.  She has stable shortness of breath that can happen with activity and at rest.  She also notes intermittent 2-pillow orthopnea.  She has anterior chest wall pain at her sternotomy incision with coughing but otherwise no significant chest pain.  She denies edema.  She reports palpitations when walking.  She denies lightheadedness.  Home BP 140's/70's.  She is tolerating losartan well.  She is participating in cardiac rehab and also plans to begin physical therapy for right hip pain in the near future.  Due to continued cough and shortness of breath, [name] was recently placed on a PPI for possible GERD.  --------------------------------------------------------------------------------------------------  Past Medical History:  Diagnosis Date  . Abdominal aortic ectasia (HCC)    a. 11/2017 CTA chest/abd/pelvis: 2.5 cm abd ao ectasia  -->rec f/u u/s in 5 yrs.  [name]. CAD (coronary artery disease)    a. 11/04/17 Cath: Native multivessel dzs-->CABG x 3 (LIMA->LAD, VG->OM2, VG->RPDA; b. 11/2017 MV: mid antlat/apical isch; c. 11/2017 Cath: LM 30d, LAD 8275m, LCX 80p/m, OM1 90, OM2 80 (@ anastamosis of graft), RCA 80p, 17750m, 90d, LIMA->LAD nl, VG->OM2 nl, VG->RPDA nl-->Med Rx.  . Carotid arterial disease (HCC)    a. 10/2017 Carotid U/S: 1-30% bilat ICA stenosis.  [name]. History of echocardiogram    a. 11/04/2017 Echo: EF of 65-70%, no RWMA, nl LV diastolic fxn, nl RV size/fxn, mild TR.  [name]. Hyperlipidemia   . Hypertension   . Left renal artery stenosis (HCC)    a. 11/2017 CTA Chest/Abd/Pelvis: 50-70% L RA stenosis.  . Pulmonary nodule, right    a. 10/2017 CT Chest: 5mm pulm nodule in R lung apex - rec f/u w/ non-contrast chest CT in 1 year.  . Pure hypercholesterolemia 11/07/2017  . Tobacco abuse   . Type II diabetes mellitus (HCC)    a. 10/2017 A1c 6.6.   Past Surgical History:  Procedure Laterality Date  . BREAST CYST EXCISION Right   . CARDIAC CATHETERIZATION    . CORONARY ARTERY BYPASS GRAFT N/A 11/12/2017   Procedure: CORONARY ARTERY BYPASS GRAFTING (CABG) x Three , using left internal mammary artery and right leg greater saphenous vein harvested endoscopically;  Surgeon: [name], Peter, MD;  [facility];  Service: Open Heart Surgery;  Laterality: N/A;  . DILATION AND CURETTAGE OF UTERUS    . LAPAROSCOPIC CHOLECYSTECTOMY    . LEFT HEART CATH AND CORONARY ANGIOGRAPHY N/A 11/04/2017   Procedure: LEFT HEART CATH AND  CORONARY ANGIOGRAPHY;  Surgeon: Yvonne Kendall, MD;  Location: [facility] INVASIVE CV LAB;  Service: Cardiovascular;  Laterality: N/A;  . LEFT HEART CATH AND CORS/GRAFTS ANGIOGRAPHY N/A 12/09/2017   Procedure: LEFT HEART CATH AND CORS/GRAFTS ANGIOGRAPHY;  Surgeon: [name], Peter M, MD;  Location: [facility] INVASIVE CV LAB;  Service: Cardiovascular;  Laterality: N/A;  . TEE WITHOUT CARDIOVERSION N/A 11/12/2017   Procedure: TRANSESOPHAGEAL  ECHOCARDIOGRAM (TEE);  Surgeon: Donata Clay, Theron Arista, MD;  Location: [facility] OR;  Service: Open Heart Surgery;  Laterality: N/A;    Current Meds  Medication Sig  . albuterol (PROVENTIL HFA;VENTOLIN HFA) 108 (90 Base) MCG/ACT inhaler Inhale 2 puffs into the lungs every 6 (six) hours as needed for wheezing or shortness of breath.  [name] aspirin EC 81 MG tablet Take 1 tablet (81 mg total) by mouth daily.  [name] atorvastatin (LIPITOR) 80 MG tablet Take 1 tablet (80 mg total) by mouth daily at 6 PM.  . citalopram (CELEXA) 20 MG tablet Take 10 mg by mouth daily.  . clopidogrel (PLAVIX) 75 MG tablet Take 1 tablet (75 mg total) by mouth daily.  . fluticasone furoate-vilanterol (BREO ELLIPTA) 100-25 MCG/INH AEPB Inhale 1 puff into the lungs daily.  [name] gabapentin (NEURONTIN) 300 MG capsule Take 1 capsule (300 mg total) by mouth 2 (two) times daily.  [name] lidocaine (XYLOCAINE) 5 % ointment Apply 1 application topically daily as needed.  [name] losartan (COZAAR) 25 MG tablet Take 1 tablet (25 mg total) by mouth daily.  . metFORMIN (GLUCOPHAGE) 500 MG tablet Take 1 tablet (500 mg total) by mouth 2 (two) times daily with a meal. Resume in 24 hrs.  . metoprolol tartrate (LOPRESSOR) 25 MG tablet Take 25 mg by mouth 2 (two) times daily.  . mometasone-formoterol (DULERA) 200-5 MCG/ACT AERO Inhale 2 puffs into the lungs 2 (two) times daily.  . nicotine (NICODERM CQ - DOSED IN MG/24 HOURS) 21 mg/24hr patch Place 21 mg onto the skin as directed.  . ondansetron (ZOFRAN) 4 MG tablet Take 1 tablet (4 mg total) by mouth every 8 (eight) hours as needed for nausea or vomiting.  . pantoprazole (PROTONIX) 40 MG tablet Take 1 tablet (40 mg total) by mouth daily.    Allergies: Patient has no known allergies.  Social History   Tobacco Use  . Smoking status: Former Smoker    Packs/day: 1.00    Years: 42.00    Pack years: 42.00    Types: Cigarettes    Last attempt to quit: 11/01/2017    Years since quitting: 0.5  . Smokeless tobacco: Never  Used  . Tobacco comment: 11/05/2017 "stopped smoking 11/01/2017"  Substance Use Topics  . Alcohol use: No  . Drug use: No    Family History  Problem Relation Age of Onset  . Hypertension Mother   . Heart Problems Mother     Review of Systems: A 12-system review of systems was performed and was negative except as noted in the HPI.  --------------------------------------------------------------------------------------------------  Physical Exam: BP 140/72 (BP Location: Left Arm, Patient Position: Sitting, Cuff Size: Normal)   Pulse (!) 57   Ht 5\' 6"  (1.676 m)   Wt 189 lb (85.7 kg)   BMI 30.51 kg/m   General:  NAD HEENT: No conjunctival pallor or scleral icterus. Moist mucous membranes.  OP clear. Neck: Supple without lymphadenopathy, thyromegaly, JVD, or HJR. Lungs: Normal work of breathing. Clear to auscultation bilaterally without wheezes or crackles. Heart: Regular rate and rhythm without murmurs, rubs, or gallops. Non-displaced PMI. Abd:  Bowel sounds present. Soft, NT/ND without hepatosplenomegaly Ext: No lower extremity edema. Radial, PT, and DP pulses are 2+ bilaterally. Skin: Warm and dry without rash.  EKG:  NSR with septal Q-waves and anterolateral and inferior T-wave inversions.  Lab Results  Component Value Date   WBC 7.6 03/26/2018   HGB 11.2 03/26/2018   HCT 34.1 03/26/2018   MCV 86 03/26/2018   PLT 454 (H) 03/26/2018    Lab Results  Component Value Date   NA 141 03/26/2018   K 3.9 03/26/2018   CL 105 03/26/2018   CO2 22 03/26/2018   BUN 13 03/26/2018   CREATININE 0.98 03/26/2018   GLUCOSE 91 03/26/2018   ALT 12 (L) 01/09/2018    Lab Results  Component Value Date   CHOL 103 12/17/2017   HDL 31 (L) 12/17/2017   LDLCALC 56 12/17/2017   TRIG 79 12/17/2017   CHOLHDL 3.3 12/17/2017    --------------------------------------------------------------------------------------------------  ASSESSMENT AND PLAN: Coronary artery disease No angina; chest  pain with cough is likely musculoskeletal related to sternotomy earlier this year.  We will continue current medications for secondary prevention, including DAPT x 12 months given ACS presentation.  Shortness of breath and chronic cough Recent echo showed normal LVEF.  I am not convinced that her symptoms are primarily cardiac in nature.  I agree with empiric trial of PPI.  If symptoms persist, pulmonology consultation should be considered.  I will also switch metoprolol to carvedilol to see if this offers any improvement.  Hyperlipidemia LDL at goal.  Continue atorvastatin 80 mg daily.  Hypertension BP suboptimally controlled.  We will switch metoprolol to carvedilol 6.25 mg BID and continue her current dose of losartan.  Follow-up: Return to clinic in 1 month.  Yvonne Kendall, MD 05/27/2018 11:16 AM

## 2018-05-27 ENCOUNTER — Encounter: Payer: Self-pay | Admitting: Internal Medicine

## 2018-05-27 ENCOUNTER — Ambulatory Visit: Payer: Medicaid Other | Admitting: Internal Medicine

## 2018-05-27 VITALS — BP 140/72 | HR 57 | Ht 66.0 in | Wt 189.0 lb

## 2018-05-27 DIAGNOSIS — I10 Essential (primary) hypertension: Secondary | ICD-10-CM | POA: Diagnosis not present

## 2018-05-27 DIAGNOSIS — R053 Chronic cough: Secondary | ICD-10-CM

## 2018-05-27 DIAGNOSIS — R05 Cough: Secondary | ICD-10-CM

## 2018-05-27 DIAGNOSIS — E785 Hyperlipidemia, unspecified: Secondary | ICD-10-CM | POA: Diagnosis not present

## 2018-05-27 DIAGNOSIS — R0602 Shortness of breath: Secondary | ICD-10-CM | POA: Diagnosis not present

## 2018-05-27 DIAGNOSIS — I2510 Atherosclerotic heart disease of native coronary artery without angina pectoris: Secondary | ICD-10-CM | POA: Diagnosis not present

## 2018-05-27 MED ORDER — CARVEDILOL 6.25 MG PO TABS: 6.2500 mg | ORAL_TABLET | Freq: Two times a day (BID) | ORAL | 3 refills | Status: DC

## 2018-05-27 NOTE — Patient Instructions (Addendum)
Medication Instructions:  Your physician has recommended you make the following change in your medication:  1- STOP Metoprolol. 2- START Carvedilol 6.25 mg (1 tablet) by mouth two times day.   Labwork: NONE  Testing/Procedures: NONE  Follow-Up: Your physician recommends that you schedule a follow-up appointment in: 1 MONTH WITH APP.   If you need a refill on your cardiac medications before your next appointment, please call your pharmacy.

## 2018-05-28 ENCOUNTER — Encounter: Payer: Medicaid Other | Attending: Cardiovascular Disease

## 2018-05-28 ENCOUNTER — Other Ambulatory Visit: Payer: Self-pay | Admitting: Physician Assistant

## 2018-05-28 ENCOUNTER — Encounter: Payer: Self-pay | Admitting: Internal Medicine

## 2018-05-28 DIAGNOSIS — Z951 Presence of aortocoronary bypass graft: Secondary | ICD-10-CM | POA: Insufficient documentation

## 2018-05-28 DIAGNOSIS — N6311 Unspecified lump in the right breast, upper outer quadrant: Secondary | ICD-10-CM

## 2018-05-28 DIAGNOSIS — R05 Cough: Secondary | ICD-10-CM | POA: Insufficient documentation

## 2018-05-28 DIAGNOSIS — Z48812 Encounter for surgical aftercare following surgery on the circulatory system: Secondary | ICD-10-CM | POA: Insufficient documentation

## 2018-05-28 DIAGNOSIS — R053 Chronic cough: Secondary | ICD-10-CM | POA: Insufficient documentation

## 2018-05-28 DIAGNOSIS — I214 Non-ST elevation (NSTEMI) myocardial infarction: Secondary | ICD-10-CM | POA: Insufficient documentation

## 2018-05-29 ENCOUNTER — Telehealth: Payer: Self-pay

## 2018-05-29 NOTE — Telephone Encounter (Signed)
No answer

## 2018-06-01 ENCOUNTER — Ambulatory Visit: Payer: Medicaid Other | Attending: Orthopedic Surgery

## 2018-06-01 DIAGNOSIS — M545 Low back pain: Secondary | ICD-10-CM | POA: Diagnosis not present

## 2018-06-01 DIAGNOSIS — I214 Non-ST elevation (NSTEMI) myocardial infarction: Secondary | ICD-10-CM

## 2018-06-01 DIAGNOSIS — Z951 Presence of aortocoronary bypass graft: Secondary | ICD-10-CM

## 2018-06-01 DIAGNOSIS — G8929 Other chronic pain: Secondary | ICD-10-CM | POA: Diagnosis present

## 2018-06-01 DIAGNOSIS — M6281 Muscle weakness (generalized): Secondary | ICD-10-CM

## 2018-06-01 DIAGNOSIS — M25551 Pain in right hip: Secondary | ICD-10-CM | POA: Diagnosis present

## 2018-06-01 DIAGNOSIS — Z48812 Encounter for surgical aftercare following surgery on the circulatory system: Secondary | ICD-10-CM | POA: Diagnosis not present

## 2018-06-01 NOTE — Patient Instructions (Signed)
Access Code: [email or web address]  URL: [email or web address]  Date: 06/01/2018  Prepared by: Precious BardMarina Zyliah Schier   Exercises  Prone Press Up on Elbows - 10 reps - 1 sets - 5 hold - 1x daily - 7x weekly  Seated Thoracic Lumbar Extension - 10 reps - 1 sets - 5 hold - 1x daily - 7x weekly  Seated Hamstring Stretch - 2 reps - 1 sets - 30 hold - 1x daily - 7x weekly  Seated Sciatic Nerve Tensioner - 10 reps - 1 sets - 5 hold - 1x daily - 7x weekly

## 2018-06-01 NOTE — Progress Notes (Signed)
Daily Session Note  Patient Details  Name: Caitlyn Herrera MRN: [id number] Date of Birth: 09/02/1961 Referring Provider:     Cardiac Rehab from 02/05/2018 in [facility] Cardiac and Pulmonary Rehab  Referring Provider  [place]/End      Encounter Date: 06/01/2018  Check In: Session Check In - 06/01/18 1653      Check-In   Supervising physician immediately available to respond to emergencies  See telemetry face sheet for immediately available ER MD    Location  [facility]-Cardiac & Pulmonary Rehab    Staff Present  Carroll Enterkin, RN, BSN;Kelly Hayes, BS, ACSM CEP, Exercise Physiologist;Amanda Sommer, BA, ACSM CEP, Exercise Physiologist    Medication changes reported      No    Fall or balance concerns reported     No    Warm-up and Cool-down  Performed on first and last piece of equipment    Resistance Training Performed  Yes    VAD Patient?  No    PAD/SET Patient?  No      Pain Assessment   Currently in Pain?  No/denies    Multiple Pain Sites  No          Social History   Tobacco Use  Smoking Status Former Smoker  . Packs/day: 1.00  . Years: 42.00  . Pack years: 42.00  . Types: Cigarettes  . Last attempt to quit: 11/01/2017  . Years since quitting: 0.5  Smokeless Tobacco Never Used  Tobacco Comment   11/05/2017 "stopped smoking 11/01/2017"    Goals Met:  Independence with exercise equipment Exercise tolerated well No report of cardiac concerns or symptoms Strength training completed today  Goals Unmet:  Not Applicable  Comments: Pt able to follow exercise prescription today without complaint.  Will continue to monitor for progression.    Dr. Mark Miller is Medical Director for [facility] and [facility]. 

## 2018-06-01 NOTE — Therapy (Signed)
[place] [facility] MAIN [facility] SERVICES [address] [place], [place], [address] Phone: [phone]   Fax:  [phone]  Physical Therapy Evaluation  Patient Details  Name: Caitlyn Herrera MRN: [id number] Date of Birth: [birth year] No data recorded  Encounter Date: 06/01/2018  PT End of Session - 06/01/18 1723    Visit Number  1    Number of Visits  8    Authorization Type  1/3 re-assess at 3    PT Start Time  1527    PT Stop Time  1600    PT Time Calculation (min)  33 min    Equipment Utilized During Treatment  Gait belt    Activity Tolerance  Patient limited by pain    Behavior During Therapy  [facility] for tasks assessed/performed;Anxious       Past Medical History:  Diagnosis Date  . Abdominal aortic ectasia (HCC)    a. 11/2017 CTA chest/abd/pelvis: 2.5 cm abd ao ectasia -->rec f/u u/s in 5 yrs.  [name] CAD (coronary artery disease)    a. 11/04/17 Cath: Native multivessel dzs-->CABG x 3 (LIMA->LAD, VG->OM2, VG->RPDA; b. 11/2017 MV: mid antlat/apical isch; c. 11/2017 Cath: LM 30d, LAD 34m, LCX 80p/m, OM1 90, OM2 80 (@ anastamosis of graft), RCA 80p, 136m, 90d, LIMA->LAD nl, VG->OM2 nl, VG->RPDA nl-->Med Rx.  . Carotid arterial disease (HCC)    a. 10/2017 Carotid U/S: 1-30% bilat ICA stenosis.  [name] History of echocardiogram    a. 11/04/2017 Echo: EF of 65-70%, no RWMA, nl LV diastolic fxn, nl RV size/fxn, mild TR.  [name] Hyperlipidemia   . Hypertension   . Left renal artery stenosis (HCC)    a. 11/2017 CTA Chest/Abd/Pelvis: 50-70% L RA stenosis.  . Pulmonary nodule, right    a. 10/2017 CT Chest: 5mm pulm nodule in R lung apex - rec f/u w/ non-contrast chest CT in 1 year.  . Pure hypercholesterolemia 11/07/2017  . Tobacco abuse   . Type II diabetes mellitus (HCC)    a. 10/2017 A1c 6.6.    Past Surgical History:  Procedure Laterality Date  . BREAST CYST EXCISION Right   . CARDIAC CATHETERIZATION    . CORONARY ARTERY BYPASS GRAFT N/A 11/12/2017   Procedure:  CORONARY ARTERY BYPASS GRAFTING (CABG) x Three , using left internal mammary artery and right leg greater saphenous vein harvested endoscopically;  Surgeon: Kerin Perna, MD;  Location: [facility] OR;  Service: Open Heart Surgery;  Laterality: N/A;  . DILATION AND CURETTAGE OF UTERUS    . LAPAROSCOPIC CHOLECYSTECTOMY    . LEFT HEART CATH AND CORONARY ANGIOGRAPHY N/A 11/04/2017   Procedure: LEFT HEART CATH AND CORONARY ANGIOGRAPHY;  Surgeon: Yvonne Kendall, MD;  Location: [facility] INVASIVE CV LAB;  Service: Cardiovascular;  Laterality: N/A;  . LEFT HEART CATH AND CORS/GRAFTS ANGIOGRAPHY N/A 12/09/2017   Procedure: LEFT HEART CATH AND CORS/GRAFTS ANGIOGRAPHY;  Surgeon: [name], Peter M, MD;  Location: [facility] INVASIVE CV LAB;  Service: Cardiovascular;  Laterality: N/A;  . TEE WITHOUT CARDIOVERSION N/A 11/12/2017   Procedure: TRANSESOPHAGEAL ECHOCARDIOGRAM (TEE);  Surgeon: Donata Clay, Theron Arista, MD;  Location: [facility] OR;  Service: Open Heart Surgery;  Laterality: N/A;    There were no vitals filed for this visit.   Subjective Assessment - 06/01/18 1533    Subjective  Patient is a pleasant 57 year old female who presents with R low back pain and hip.     Pertinent History  Patient presents with low back pain and right hip pain. Pain  has began about 3 years ago and pain has worsened since January. Went to doctor last week and had injections and pain medication. Patient reports it lasted for a few days. Not given any exercises to do for it. Pain worsens with walking and laying on R hip. Pain relieved by standing. Going to cardiac rehab. PMH includes history of CAD status post CABG in the setting of NSTEMI in 10/2017 complicated by recurrent chest pain with repeat catheterization demonstrating severe native CAD and patent grafts, hypertension, hyperlipidemia, and type 2 diabetes mellitus,     Limitations  Sitting;Lifting;Standing;Walking;House hold activities;Other (comment)    How long can you sit comfortably?  15    How long  can you stand comfortably?  30    How long can you walk comfortably?  10 minutes    Diagnostic tests  Minimal degenerative spurring at bilateral hip acetabular margins and enthesopathic spurring at the anterior lateral iliac crests.  Features suggest L4-5 degenerative disc disease.     Patient Stated Goals  to reduce pain     Currently in Pain?  Yes    Pain Score  7     Pain Location  Back    Pain Orientation  Right    Pain Descriptors / Indicators  Sharp    Pain Type  Chronic pain    Pain Radiating Towards  to first two toes    Pain Onset  More than a month ago    Pain Frequency  Intermittent    Aggravating Factors   walking and laying on R hip    Pain Relieving Factors  standing    Effect of Pain on Daily Activities  limits            [facility] PT Assessment - 06/01/18 0001      Assessment   Medical Diagnosis  low back and R hip pain    Onset Date/Surgical Date  -- 3 years ago    Hand Dominance  Right    Next MD Visit  -- next week    Prior Therapy  no      Precautions   Precautions  None      Restrictions   Weight Bearing Restrictions  No      Balance Screen   Has the patient fallen in the past 6 months  No    Has the patient had a decrease in activity level because of a fear of falling?   Yes    Is the patient reluctant to leave their home because of a fear of falling?   Yes      Home Environment   Living Environment  Private residence    Living Arrangements  Alone;Other relatives granddaughter    Available Help at Discharge  Family    Type of Home  Apartment    Home Access  Level entry    Home Layout  One level    Home Equipment  Gilmer Mor - single point      Prior Function   Level of Independence  Needs assistance with ADLs;Needs assistance with homemaking daughter helps     Vocation  Unemployed    Leisure  watch tv      Cognition   Overall Cognitive Status  Within Functional Limits for tasks assessed      Observation/Other Assessments   Observations  fearful  of pain, pain avoidant behavior       Observation/Other Assessments-Edema    Edema  -- none noted  Sensation   Light Touch  Appears Intact      Coordination   Gross Motor Movements are Fluid and Coordinated  No    Heel Shin Test  unable to perform due to pain       Posture/Postural Control   Posture/Postural Control  Postural limitations    Postural Limitations  Forward head;Rounded Shoulders;Weight shift left      ROM / Strength   AROM / PROM / Strength  Strength;AROM      AROM   Overall AROM   Deficits;Due to pain    AROM Assessment Site  Lumbar;Hip;Knee;Ankle    Right/Left Hip  Right;Left    Right Hip Extension  -- limited by pain bilaterally, unable to test    Right Hip Flexion  -- limtied by pain    Right Hip External Rotation   -- limited by pain    Right Hip Internal Rotation   -- limited by pain    Right Hip ABduction  -- limited by pain    Right Hip ADduction  -- limited by pain    Left Hip Extension  -- limited; pull on neck    Left Hip Flexion  -- limited due to pain in low back    Left Hip External Rotation   -- limited by pain in low back    Left Hip Internal Rotation   -- limited by pain in low back     Left Hip ABduction  -- limited by pain in low back    Left Hip ADduction  -- limited by pain in low back    Right/Left Knee  Right;Left    Right Knee Extension  -- WFL    Right Knee Flexion  -- WFL    Left Knee Extension  -- [facility]    Left Knee Flexion  -- [facility]    Right/Left Ankle  Right;Left    Right Ankle Dorsiflexion  -- WFL    Right Ankle Plantar Flexion  -- WFL    Left Ankle Dorsiflexion  -- WFL    Left Ankle Plantar Flexion  -- [facility]    Lumbar Flexion  -- limited by pain    Lumbar Extension  -- relieves pain    Lumbar - Right Side Bend  -- limited by pain    Lumbar - Left Side Bend  -- limited by pain    Lumbar - Right Rotation  -- limited by pain    Lumbar - Left Rotation  -- limited by pain       Strength   Overall Strength  Deficits;Due to pain     Overall Strength Comments  limited by muscle guarding    Strength Assessment Site  Hip;Knee;Ankle    Right/Left Hip  Right;Left    Right Hip Flexion  2+/5    Right Hip Extension  2+/5    Right Hip ABduction  3+/5    Right Hip ADduction  3+/5    Left Hip Flexion  4-/5    Left Hip Extension  4/5    Left Hip ABduction  4/5    Left Hip ADduction  4-/5    Right/Left Knee  Right;Left    Right Knee Flexion  3-/5    Right Knee Extension  3-/5    Left Knee Flexion  4-/5    Left Knee Extension  4-/5    Right/Left Ankle  Right;Left    Right Ankle Dorsiflexion  2+/5    Right Ankle Plantar Flexion  2+/5  Left Ankle Dorsiflexion  4-/5    Left Ankle Plantar Flexion  4-/5      Flexibility   Soft Tissue Assessment /Muscle Length  yes    Hamstrings  limited hamstring length bilaterally     Quadriceps  limited iliopsoas length       Palpation   Spinal mobility  hypomobile lumbar spine with tenderness to CPAs and UPAs    Palpation comment  R hip : mobilizations PA and AP painful and empty end feel       Special Tests    Special Tests  Hip Special Tests;Lumbar    Lumbar Tests  Straight Leg Raise;Slump Test    Hip Special Tests   -- unable to bring hip into 90 90       Slump test   Findings  Positive    Side  Right      Straight Leg Raise   Findings  Positive    Side   Right      Bed Mobility   Bed Mobility  Rolling Left;Supine to Sit;Sit to Supine    Rolling Left  Set up assist    Supine to Sit  Set up assist    Sit to Supine  Set up assist      Transfers   Transfers  Sit to Stand;Stand to Sit    Sit to Stand  4: Min guard;With upper extremity assist;With armrests    Five time sit to stand comments   unable to perform 5 in a row      Ambulation/Gait   Ambulation/Gait  Yes    Ambulation/Gait Assistance  4: Min guard    Ambulation Distance (Feet)  30 Feet    Assistive device  Straight cane    Gait Pattern  Decreased stance time - right;Antalgic         PAIN: Current  pain: 7/10 R hip and back Worst pain: 10/10       OUTCOME MEASURES: TEST Outcome Interpretation  5 times sit<>stand Unable to perform sec >60 yo, >15 sec indicates increased risk for falls  MODI 74% Low physical funcitoning                           Objective measurements completed on examination: See above findings.     Access Code: [email or web address]  URL: [email or web address]  Date: 06/01/2018  Prepared by: Precious BardMarina Fleetwood Pierron   Exercises  Prone Press Up on Elbows - 10 reps - 1 sets - 5 hold - 1x daily - 7x weekly  Seated Thoracic Lumbar Extension - 10 reps - 1 sets - 5 hold - 1x daily - 7x weekly  Seated Hamstring Stretch - 2 reps - 1 sets - 30 hold - 1x daily - 7x weekly  Seated Sciatic Nerve Tensioner - 10 reps - 1 sets - 5 hold - 1x daily - 7x weekly            PT Education - 06/01/18 1722    Education Details  HEP, POC,     Person(s) Educated  Patient    Methods  Explanation;Handout;Demonstration    Comprehension  Verbalized understanding;Returned demonstration       PT Short Term Goals - 06/01/18 1727      PT SHORT TERM GOAL #1   Title  Patient will be independent in home exercise program to improve strength/mobility for better functional independence with ADLs.    Baseline  HEP given    Time  2    Period  Weeks    Status  New    Target Date  06/15/18      PT SHORT TERM GOAL #2   Title  Patient will perform 5 consecutive sit to stands to increase functional strength and mobility     Baseline  8/5: unable to perform 5    Time  2    Period  Weeks    Status  New    Target Date  06/15/18      PT SHORT TERM GOAL #3   Title  Patient will report a worst pain of 7/10 on VAS in    low back/hip to improve tolerance with ADLs and reduced symptoms with activities.     Baseline  8/5: 10/10    Time  2    Period  Weeks    Status  New    Target Date  06/15/18        PT Long Term Goals - 06/01/18 1729      PT LONG TERM GOAL #1   Title   Patient will report a worst pain of 3/10 on VAS in  low back/hip  to improve tolerance with ADLs and reduced symptoms with activities.     Baseline  8/5: 10/10    Time  4    Period  Weeks    Status  New    Target Date  06/29/18      PT LONG TERM GOAL #2   Title  Patient will reduce modified Oswestry score to <20 as to demonstrate minimal disability with ADLs including improved sleeping tolerance, walking/sitting tolerance etc for better mobility with ADLs.     Baseline  8/5: 74%     Time  4    Period  Weeks    Status  New    Target Date  06/29/18      PT LONG TERM GOAL #3   Title  Patient (< 42 years old) will complete five times sit to stand test in < 10 seconds indicating an increased LE strength and improved balance.    Baseline  unable to perform 5     Time  4    Period  Weeks    Status  New    Target Date  06/29/18      PT LONG TERM GOAL #4   Title  Patient will increase BLE gross strength to 4+/5 as to improve functional strength for independent gait, increased standing tolerance and increased ADL ability.    Baseline  8/5: R 2+/5 L 4-/5    Time  4    Period  Weeks    Status  New    Target Date  06/29/18             Plan - 06/01/18 1723    Clinical Impression Statement   Patient is a pleasant 57 year old female who presents for R low back pain and R hip  Pain. Patient's session limited due to time restraint from patient tardiness. Patient has fear responses that limit ability to palpate and move extremities. Patient hip and lumbar ROM limited by pain. Mobilizations of spine hypomobile with CPAs and UPAs and noted muscle guarding. R hip mobilizations limited by muscle guarding with empty end feel. Unable to perform full Hip special tests due to being unable to bring hip into 90 90 position. Extension of lumbar region relieved pain, patient educated in HEP for extension, nerve glides, and muscle tissue lengthening. Patient will  benefit from skilled physical therapy to  decrease pain and improve mobility for quality of life.     History and Personal Factors relevant to plan of care:  This patient presents with 3 personal factors/ comorbidities  and   body elements including body structures and functions, activity limitations and or participation restrictions. Patient's condition is , unstable    Clinical Presentation  Unstable    Clinical Presentation due to:  unstable pain level and cardiovascular status, respiratory deficits.     Clinical Decision Making  High    Rehab Potential  Fair    Clinical Impairments Affecting Rehab Potential  (+) age, currently in heart track (-) severity and chronicity of pain.     PT Frequency  2x / week    PT Duration  4 weeks    PT Treatment/Interventions  ADLs/Self Care Home Management;Aquatic Therapy;Cryotherapy;Electrical Stimulation;Iontophoresis 4mg /ml Dexamethasone;Moist Heat;Traction;Ultrasound;Functional mobility training;Gait training;DME Instruction;Fluidtherapy;Therapeutic activities;Therapeutic exercise;Balance training;Neuromuscular re-education;Manual techniques;Patient/family education;Scar mobilization;Passive range of motion;Dry needling;Taping;Energy conservation    PT Next Visit Plan  pain reduction review HEP,     PT Home Exercise Plan  see sheet    Recommended Other Services  n/a    Consulted and Agree with Plan of Care  Patient       Patient will benefit from skilled therapeutic intervention in order to improve the following deficits and impairments:  Abnormal gait, Decreased activity tolerance, Decreased balance, Decreased coordination, Decreased mobility, Decreased range of motion, Difficulty walking, Decreased strength, Hypomobility, Impaired flexibility, Impaired perceived functional ability, Increased muscle spasms, Postural dysfunction, Improper body mechanics, Pain  Visit Diagnosis: Chronic right-sided low back pain, with sciatica presence unspecified  Pain in right hip  Muscle weakness  (generalized)     Problem List Patient Active Problem List   Diagnosis Date Noted  . Chronic cough 05/28/2018  . Shortness of breath 03/26/2018  . Essential hypertension 03/26/2018  . Palpitations 03/26/2018  . Chest pressure 12/08/2017  . Unstable angina (HCC) 12/08/2017  . Angina at rest [facility])   . Hyperlipidemia LDL goal <70   . S/P CABG (coronary artery bypass graft) 11/12/2017  . Non-insulin dependent type 2 diabetes mellitus (HCC) 11/10/2017  . 3-vessel CAD 11/10/2017  . HCVD (hypertensive cardiovascular disease) 11/10/2017  . UTI (urinary tract infection) 11/10/2017  . Pulmonary nodule, right 11/10/2017  . Dyslipidemia 11/07/2017  . Tobacco abuse   . NSTEMI (non-ST elevated myocardial infarction) (HCC) 11/05/2017  Precious Bard, PT, DPT    06/01/2018, 5:32 PM  [place] [facility] MAIN [facility] SERVICES [address] [place], [place], [address] Phone: [phone]   Fax:  [phone]  Name: Caitlyn Herrera MRN: [id number] Date of Birth: Jun 15, 1961

## 2018-06-02 ENCOUNTER — Other Ambulatory Visit: Payer: Medicaid Other

## 2018-06-05 ENCOUNTER — Telehealth: Payer: Self-pay

## 2018-06-05 NOTE — Telephone Encounter (Signed)
Spoke with Caitlyn Herrera - 18 weeks timeis up and she completed 23 sessions.  She is in PT and interested in [employer].  Will stop by after her PT appt Monday to get info

## 2018-06-05 NOTE — Progress Notes (Signed)
Pulmonary Individual Treatment Plan  Patient Details  Name: Caitlyn Herrera MRN: [id number] Date of Birth: [birth year] Referring Provider:     Cardiac Rehab from 02/05/2018 in [facility] Cardiac and Pulmonary Rehab  Referring Provider  [place]/End      Initial Encounter Date:    Cardiac Rehab from 02/05/2018 in [facility] Cardiac and Pulmonary Rehab  Date  02/05/18      Visit Diagnosis: No diagnosis found.  Patient's Home Medications on Admission:  Current Outpatient Medications:  .  albuterol (PROVENTIL HFA;VENTOLIN HFA) 108 (90 Base) MCG/ACT inhaler, Inhale 2 puffs into the lungs every 6 (six) hours as needed for wheezing or shortness of breath., Disp: 8 g, Rfl: 2 .  aspirin EC 81 MG tablet, Take 1 tablet (81 mg total) by mouth daily., Disp: 90 tablet, Rfl: 3 .  atorvastatin (LIPITOR) 80 MG tablet, Take 1 tablet (80 mg total) by mouth daily at 6 PM., Disp: 30 tablet, Rfl: 3 .  carvedilol (COREG) 6.25 MG tablet, Take 1 tablet (6.25 mg total) by mouth 2 (two) times daily., Disp: 180 tablet, Rfl: 3 .  citalopram (CELEXA) 20 MG tablet, Take 10 mg by mouth daily., Disp: , Rfl:  .  clopidogrel (PLAVIX) 75 MG tablet, Take 1 tablet (75 mg total) by mouth daily., Disp: 90 tablet, Rfl: 3 .  fluticasone furoate-vilanterol (BREO ELLIPTA) 100-25 MCG/INH AEPB, Inhale 1 puff into the lungs daily., Disp: , Rfl:  .  gabapentin (NEURONTIN) 300 MG capsule, Take 1 capsule (300 mg total) by mouth 2 (two) times daily., Disp: 60 capsule, Rfl: 3 .  lidocaine (XYLOCAINE) 5 % ointment, Apply 1 application topically daily as needed., Disp: , Rfl:  .  losartan (COZAAR) 25 MG tablet, Take 1 tablet (25 mg total) by mouth daily., Disp: 90 tablet, Rfl: 3 .  metFORMIN (GLUCOPHAGE) 500 MG tablet, Take 1 tablet (500 mg total) by mouth 2 (two) times daily with a meal. Resume in 24 hrs., Disp: 60 tablet, Rfl: 1 .  mometasone-formoterol (DULERA) 200-5 MCG/ACT AERO, Inhale 2 puffs into the lungs 2 (two) times daily., Disp: , Rfl:    .  nicotine (NICODERM CQ - DOSED IN MG/24 HOURS) 21 mg/24hr patch, Place 21 mg onto the skin as directed., Disp: , Rfl:  .  ondansetron (ZOFRAN) 4 MG tablet, Take 1 tablet (4 mg total) by mouth every 8 (eight) hours as needed for nausea or vomiting., Disp: 20 tablet, Rfl: 0 .  pantoprazole (PROTONIX) 40 MG tablet, Take 1 tablet (40 mg total) by mouth daily., Disp: 30 tablet, Rfl: 3  Past Medical History: Past Medical History:  Diagnosis Date  . Abdominal aortic ectasia ([place])    a. 11/2017 CTA chest/abd/pelvis: 2.5 cm abd ao ectasia -->rec f/u u/s in 5 yrs.  [name] CAD (coronary artery disease)    a. 11/04/17 Cath: Native multivessel dzs-->CABG x 3 (LIMA->LAD, VG->OM2, VG->RPDA; b. 11/2017 MV: mid antlat/apical isch; c. 11/2017 Cath: LM 30d, LAD 39m LCX 80p/m, OM1 90, OM2 80 (@ anastamosis of graft), RCA 80p, 1033m90d, LIMA->LAD nl, VG->OM2 nl, VG->RPDA nl-->Med Rx.  . Carotid arterial disease ([facility]   a. 10/2017 Carotid U/S: 1-30% bilat ICA stenosis.  . Marland Kitchenistory of echocardiogram    a. 11/04/2017 Echo: EF of 65-70%, no RWMA, nl LV diastolic fxn, nl RV size/fxn, mild TR.  . Marland Kitchenyperlipidemia   . Hypertension   . Left renal artery stenosis ([facility])    a. 11/2017 CTA Chest/Abd/Pelvis: 50-70% L RA stenosis.  . Pulmonary nodule, right  a. 10/2017 CT Chest: 77m pulm nodule in R lung apex - rec f/u w/ non-contrast chest CT in 1 year.  . Pure hypercholesterolemia 11/07/2017  . Tobacco abuse   . Type II diabetes mellitus ([facility]    a. 10/2017 A1c 6.6.    Tobacco Use: Social History   Tobacco Use  Smoking Status Former Smoker  . Packs/day: 1.00  . Years: 42.00  . Pack years: 42.00  . Types: Cigarettes  . Last attempt to quit: 11/01/2017  . Years since quitting: 0.5  Smokeless Tobacco Never Used  Tobacco Comment   11/05/2017 "stopped smoking 11/01/2017"    Labs: Recent Review Flowsheet Data    Labs for ITP Cardiac and Pulmonary Rehab Latest Ref Rng & Units 11/13/2017 11/13/2017 11/13/2017 12/08/2017 12/17/2017    Cholestrol 100 - 199 mg/dL - - - - 103   LDLCALC 0 - 99 mg/dL - - - - 56   HDL >39 mg/dL - - - - 31(L)   Trlycerides 0 - 149 mg/dL - - - - 79   Hemoglobin A1c 4.8 - 5.6 % - - - - -   PHART 7.350 - 7.450 7.303(L) 7.343(L) - - -   PCO2ART 32.0 - 48.0 mmHg 47.0 43.2 - - -   HCO3 20.0 - 28.0 mmol/L 23.0 23.2 - - -   TCO2 22 - 32 mmol/L _0 -   ACIDBASEDEF 0.0 - 2.0 mmol/L 3.0(H) 2.0 - - -   O2SAT % 99.0 97.0 - - -       Pulmonary Assessment Scores:   Pulmonary Function Assessment:   Exercise Target Goals: Exercise Program Goal: Individual exercise prescription set using results from initial 6 min walk test and THRR while considering  patient's activity barriers and safety.   Exercise Prescription Goal: Initial exercise prescription builds to 30-45 minutes a day of aerobic activity, 2-3 days per week.  Home exercise guidelines will be given to patient during program as part of exercise prescription that the participant will acknowledge.  Activity Barriers & Risk Stratification: Activity Barriers & Cardiac Risk Stratification - 02/05/18 1415      Activity Barriers & Cardiac Risk Stratification   Activity Barriers  Arthritis;Shortness of Breath;Chest Pain/Angina   arthritis in her hips   Cardiac Risk Stratification  High       6 Minute Walk: 6 Minute Walk    Row Name 02/05/18 1436         6 Minute Walk   Distance  942 feet     Distance Feet Change  0 ft     Walk Time  6 minutes     # of Rest Breaks  0     MPH  1.78     METS  2.9     RPE  15     Perceived Dyspnea   2     VO2 Peak  10.14     Symptoms  Yes (comment)     Comments  hip pain      Resting HR  61 bpm     Resting BP  142/76     Resting Oxygen Saturation   100 %     Exercise Oxygen Saturation  during 6 min walk  99 %     Max Ex. HR  90 bpm     Max Ex. BP  166/70     2 Minute Post BP  146/70       Oxygen Initial Assessment:   Oxygen Re-Evaluation:  Oxygen Discharge (Final Oxygen  Re-Evaluation):   Initial Exercise Prescription: Initial Exercise Prescription - 02/05/18 1400      Date of Initial Exercise RX and Referring Provider   Date  02/05/18    Referring Provider  [place]/End      Treadmill   MPH  1.8    Grade  0    Minutes  15    METs  2.4      Recumbant Bike   Level  3    RPM  60    Watts  10    Minutes  15    METs  2.4      NuStep   Level  2    SPM  80    Minutes  15    METs  2.4      REL-XR   Level  2    Speed  50    Minutes  15    METs  2.4      Prescription Details   Frequency (times per week)  3    Duration  Progress to 45 minutes of aerobic exercise without signs/symptoms of physical distress      Intensity   THRR 40-80% of Max Heartrate  101-141    Ratings of Perceived Exertion  11-13    Perceived Dyspnea  0-4      Resistance Training   Training Prescription  Yes    Weight  3 lb    Reps  10-15       Perform Capillary Blood Glucose checks as needed.  Exercise Prescription Changes: Exercise Prescription Changes    Row Name 02/05/18 1400 02/11/18 1500 02/26/18 1000 03/10/18 1100 03/12/18 1400     Response to Exercise   Blood Pressure (Admit)  142/76  130/60  128/74  130/80  -   Blood Pressure (Exercise)  166/70  152/86  132/70  162/80  -   Blood Pressure (Exit)  146/70  138/62  130/70  150/80  -   Heart Rate (Admit)  55 bpm  68 bpm  69 bpm  75 bpm  -   Heart Rate (Exercise)  90 bpm  87 bpm  79 bpm  93 bpm  -   Heart Rate (Exit)  57 bpm  56 bpm  63 bpm  73 bpm  -   Oxygen Saturation (Admit)  100 %  -  -  -  -   Oxygen Saturation (Exit)  99 %  -  -  -  -   Rating of Perceived Exertion (Exercise)  _0 -   Perceived Dyspnea (Exercise)  2  -  -  -  -   Symptoms  -  none  none  none  -   Duration  -  Progress to 45 minutes of aerobic exercise without signs/symptoms of physical distress  Progress to 45 minutes of aerobic exercise without signs/symptoms of physical distress  Continue with 45 min of aerobic  exercise without signs/symptoms of physical distress.  -   Intensity  -  THRR unchanged  THRR unchanged  THRR unchanged  -     Progression   Progression  -  Continue to progress workloads to maintain intensity without signs/symptoms of physical distress.  Continue to progress workloads to maintain intensity without signs/symptoms of physical distress.  Continue to progress workloads to maintain intensity without signs/symptoms of physical distress.  -   Average METs  -  2.5  2.4  2.15  -     Resistance Training   Training Prescription  -  Yes  Yes  Yes  -   Weight  -  3 lb  3 lb  3 lb  -   Reps  -  10-15  10-15  10-15  -     Interval Training   Interval Training  -  No  No  No  -     Treadmill   MPH  -  1.5  1.5  1.5  -   Grade  -  0  0  0  -   Minutes  -  _0 -   METs  -  2.15  2.15  2.15  -     Recumbant Bike   Level  -  1  1  -  -   Watts  -  -  20  -  -   Minutes  -  15  15  -  -   METs  -  2.73  2.74  -  -     Biostep-RELP   Level  -  -  -  2  -   SPM  -  -  -  40  -   Minutes  -  -  -  15  -   METs  -  -  -  2  -     Home Exercise Plan   Plans to continue exercise at  -  -  -  -  Home (comment) considering FF or fitness center - walk at park or mall   Frequency  -  -  -  -  Add 2 additional days to program exercise sessions.   Initial Home Exercises Provided  -  -  -  -  03/11/18   Row Name 03/27/18 0900 04/08/18 1200 04/24/18 0900 05/21/18 1000       Response to Exercise   Blood Pressure (Admit)  130/68  136/84  142/70  130/65    Blood Pressure (Exercise)  148/68  140/72  -  142/72    Blood Pressure (Exit)  124/68  128/62  140/84  124/78    Heart Rate (Admit)  74 bpm  68 bpm  53 bpm  71 bpm    Heart Rate (Exercise)  101 bpm  88 bpm  89 bpm  71 bpm    Heart Rate (Exit)  73 bpm  73 bpm  67 bpm  60 bpm    Rating of Perceived Exertion (Exercise)  _1 Symptoms  none  none  none  none    Duration  Continue with 45 min of aerobic exercise without  signs/symptoms of physical distress.  Continue with 45 min of aerobic exercise without signs/symptoms of physical distress.  Continue with 45 min of aerobic exercise without signs/symptoms of physical distress.  Continue with 45 min of aerobic exercise without signs/symptoms of physical distress.    Intensity  THRR unchanged  THRR unchanged  THRR unchanged  THRR unchanged      Progression   Progression  Continue to progress workloads to maintain intensity without signs/symptoms of physical distress.  Continue to progress workloads to maintain intensity without signs/symptoms of physical distress.  Continue to progress workloads to maintain intensity without signs/symptoms of physical distress.  Continue to progress workloads to maintain intensity without signs/symptoms of physical distress.    Average METs  2.15  2.15  2  2.5      Resistance Training   Training Prescription  Yes  Yes  Yes  Yes    Weight  3 lb  3 lb  3 lb  3 lb    Reps  10-15  10-15  10-15  10-15      Interval Training   Interval Training  No  No  No  No      Treadmill   MPH  1.5  1.5  1.5  1.5    Grade  0  0  0  0    Minutes  _0 METs  2.15  2.15  2.15  2.15      Recumbant Bike   Level  -  -  -  1    RPM  -  -  -  60    Watts  -  -  -  20    Minutes  -  -  -  15    METs  -  -  -  2.73      Biostep-RELP   Level  _1 -    SPM  40  -  -  -    Minutes  _2 -    METs  _3 -      Home Exercise Plan   Plans to continue exercise at  Home (comment) considering FF or fitness center - walk at park or mall  Home (comment) considering FF or fitness center - walk at park or [place] (comment) considering FF or fitness center - walk at park or mall  -    Frequency  Add 2 additional days to program exercise sessions.  Add 2 additional days to program exercise sessions.  Add 2 additional days to program exercise sessions.  -    Initial Home Exercises Provided  03/11/18  03/11/18  03/11/18  -         Exercise Comments: Exercise Comments    Row Name 02/09/18 1720           Exercise Comments  First full day of exercise!  Patient was oriented to gym and equipment including functions, settings, policies, and procedures.  Patient's individual exercise prescription and treatment plan were reviewed.  All starting workloads were established based on the results of the 6 minute walk test done at initial orientation visit.  The plan for exercise progression was also introduced and progression will be customized based on patient's performance and goals.          Exercise Goals and Review: Exercise Goals    Row Name 02/05/18 1435 02/05/18 1441           Exercise Goals   Increase Physical Activity  Yes  Yes      Intervention  Provide advice, education, support and counseling about physical activity/exercise needs.;Develop an individualized exercise prescription for aerobic and resistive training based on initial evaluation findings, risk stratification, comorbidities and participant's personal goals.  Provide advice, education, support and counseling about physical activity/exercise needs.;Develop an individualized exercise prescription for aerobic and resistive training based on initial evaluation findings, risk stratification, comorbidities and participant's personal goals.      Expected Outcomes  Short Term: Attend rehab on a regular basis to increase amount of physical activity.;Long Term: Add in home exercise to make exercise part  of routine and to increase amount of physical activity.;Long Term: Exercising regularly at least 3-5 days a week.  Short Term: Attend rehab on a regular basis to increase amount of physical activity.;Long Term: Add in home exercise to make exercise part of routine and to increase amount of physical activity.;Long Term: Exercising regularly at least 3-5 days a week.      Increase Strength and Stamina  Yes  Yes      Intervention  Provide advice, education, support  and counseling about physical activity/exercise needs.;Develop an individualized exercise prescription for aerobic and resistive training based on initial evaluation findings, risk stratification, comorbidities and participant's personal goals.  Provide advice, education, support and counseling about physical activity/exercise needs.;Develop an individualized exercise prescription for aerobic and resistive training based on initial evaluation findings, risk stratification, comorbidities and participant's personal goals.      Expected Outcomes  Short Term: Increase workloads from initial exercise prescription for resistance, speed, and METs.;Short Term: Perform resistance training exercises routinely during rehab and add in resistance training at home;Long Term: Improve cardiorespiratory fitness, muscular endurance and strength as measured by increased METs and functional capacity (6MWT)  Short Term: Increase workloads from initial exercise prescription for resistance, speed, and METs.;Short Term: Perform resistance training exercises routinely during rehab and add in resistance training at home;Long Term: Improve cardiorespiratory fitness, muscular endurance and strength as measured by increased METs and functional capacity (6MWT)      Able to understand and use rate of perceived exertion (RPE) scale  Yes  Yes      Intervention  Provide education and explanation on how to use RPE scale  Provide education and explanation on how to use RPE scale      Expected Outcomes  Short Term: Able to use RPE daily in rehab to express subjective intensity level;Long Term:  Able to use RPE to guide intensity level when exercising independently  Short Term: Able to use RPE daily in rehab to express subjective intensity level;Long Term:  Able to use RPE to guide intensity level when exercising independently      Able to understand and use Dyspnea scale  Yes  Yes      Intervention  Provide education and explanation on how to use  Dyspnea scale  Provide education and explanation on how to use Dyspnea scale      Expected Outcomes  Short Term: Able to use Dyspnea scale daily in rehab to express subjective sense of shortness of breath during exertion;Long Term: Able to use Dyspnea scale to guide intensity level when exercising independently  Short Term: Able to use Dyspnea scale daily in rehab to express subjective sense of shortness of breath during exertion;Long Term: Able to use Dyspnea scale to guide intensity level when exercising independently      Knowledge and understanding of Target Heart Rate Range (THRR)  Yes  Yes      Intervention  Provide education and explanation of THRR including how the numbers were predicted and where they are located for reference  Provide education and explanation of THRR including how the numbers were predicted and where they are located for reference      Expected Outcomes  Short Term: Able to state/look up THRR;Short Term: Able to use daily as guideline for intensity in rehab;Long Term: Able to use THRR to govern intensity when exercising independently  Short Term: Able to state/look up THRR;Short Term: Able to use daily as guideline for intensity in rehab;Long Term: Able to use THRR to  govern intensity when exercising independently      Able to check pulse independently  Yes  Yes      Intervention  Provide education and demonstration on how to check pulse in carotid and radial arteries.;Review the importance of being able to check your own pulse for safety during independent exercise  Provide education and demonstration on how to check pulse in carotid and radial arteries.;Review the importance of being able to check your own pulse for safety during independent exercise      Expected Outcomes  Short Term: Able to explain why pulse checking is important during independent exercise;Long Term: Able to check pulse independently and accurately  Short Term: Able to explain why pulse checking is important  during independent exercise;Long Term: Able to check pulse independently and accurately      Understanding of Exercise Prescription  Yes  Yes      Intervention  Provide education, explanation, and written materials on patient's individual exercise prescription  Provide education, explanation, and written materials on patient's individual exercise prescription      Expected Outcomes  Short Term: Able to explain program exercise prescription;Long Term: Able to explain home exercise prescription to exercise independently  Short Term: Able to explain program exercise prescription;Long Term: Able to explain home exercise prescription to exercise independently         Exercise Goals Re-Evaluation : Exercise Goals Re-Evaluation    Row Name 02/09/18 1720 02/26/18 1041 03/10/18 1206 03/12/18 1436 03/27/18 0953     Exercise Goal Re-Evaluation   Exercise Goals Review  Increase Physical Activity;Increase Strength and Stamina;Able to understand and use rate of perceived exertion (RPE) scale;Knowledge and understanding of Target Heart Rate Range (THRR);Understanding of Exercise Prescription  Increase Physical Activity;Able to understand and use rate of perceived exertion (RPE) scale;Increase Strength and Stamina  Increase Physical Activity;Able to understand and use rate of perceived exertion (RPE) scale;Increase Strength and Stamina  Increase Physical Activity;Able to understand and use rate of perceived exertion (RPE) scale;Knowledge and understanding of Target Heart Rate Range (THRR);Understanding of Exercise Prescription;Increase Strength and Stamina  Increase Physical Activity;Able to understand and use rate of perceived exertion (RPE) scale;Increase Strength and Stamina   Comments  Reviewed RPE scale, THR and program prescription with pt today.  Pt voiced understanding and was given a copy of goals to take home.  Arville Go has missed classes so far this week beacuse she has been sick.  She will progress more with  regular attendance.  Shirlene would progress further with regular attendance.  Staff will monitor progress.  Reviewed home exercise with pt - incl HR/RPE/safety.  Pt voiced understanding  Eddis needs to attend more regularly to see progression. Staff will continue to monitor.   Expected Outcomes  Short: Use RPE daily to regulate intensity.  Long: Follow program prescription in THR.  Short - Pt will attend regularly Long - Pt will progress MET level  Short - Pt will attedn 2-3 days per week Long - Pt wil improve overall MET levels  Short - Pt will add 1-2 days exercise outside program sessions Long - Pt will maintain exercise on her own  Short - Nihal will continue to attend HT Long - [place] will improve overall MET level   Row Name 04/08/18 1218 04/24/18 0950 05/21/18 1015         Exercise Goal Re-Evaluation   Exercise Goals Review  Increase Physical Activity;Increase Strength and Stamina;Able to understand and use rate of perceived exertion (RPE) scale;Knowledge and understanding  of Target Heart Rate Range (THRR)  Increase Physical Activity;Able to understand and use rate of perceived exertion (RPE) scale;Increase Strength and Stamina  Increase Physical Activity;Able to understand and use rate of perceived exertion (RPE) scale;Increase Strength and Stamina;Able to check pulse independently;Knowledge and understanding of Target Heart Rate Range (THRR);Understanding of Exercise Prescription     Comments  Mireyah attends sporadically and has met RPE goals but not progressed as much.  Staff will suggest adding incline to TM.  Pt would progress more with regular attendance.  Burnis has not attended consistently but is walking on her own.       Expected Outcomes  Short - Lannie will increase work levels.  Long - Kmya will improve overall MET level  Short - attend class 2-3 times per week Long - increase MET level  Short - finish HT program Long - maintain exercise on her own        Discharge Exercise  Prescription (Final Exercise Prescription Changes): Exercise Prescription Changes - 05/21/18 1000      Response to Exercise   Blood Pressure (Admit)  130/65    Blood Pressure (Exercise)  142/72    Blood Pressure (Exit)  124/78    Heart Rate (Admit)  71 bpm    Heart Rate (Exercise)  71 bpm    Heart Rate (Exit)  60 bpm    Rating of Perceived Exertion (Exercise)  11    Symptoms  none    Duration  Continue with 45 min of aerobic exercise without signs/symptoms of physical distress.    Intensity  THRR unchanged      Progression   Progression  Continue to progress workloads to maintain intensity without signs/symptoms of physical distress.    Average METs  2.5      Resistance Training   Training Prescription  Yes    Weight  3 lb    Reps  10-15      Interval Training   Interval Training  No      Treadmill   MPH  1.5    Grade  0    Minutes  15    METs  2.15      Recumbant Bike   Level  1    RPM  60    Watts  20    Minutes  15    METs  2.73       Nutrition:  Target Goals: Understanding of nutrition guidelines, daily intake of sodium <1572m, cholesterol <2025m calories 30% from fat and 7% or less from saturated fats, daily to have 5 or more servings of fruits and vegetables.  Biometrics: Pre Biometrics - 02/05/18 1434      Pre Biometrics   Height  5' 7" (1.702 m)    Weight  187 lb 14.4 oz (85.2 kg)    Waist Circumference  34 inches    Hip Circumference  42 inches    Waist to Hip Ratio  0.81 %    BMI (Calculated)  29.42    Single Leg Stand  11.12 seconds        Nutrition Therapy Plan and Nutrition Goals:   Nutrition Assessments: Nutrition Assessments - 02/05/18 1404      MEDFICTS Scores   Pre Score  65       Nutrition Goals Re-Evaluation: Nutrition Goals Re-Evaluation    Row Name 04/01/18 1644 04/29/18 1707           Goals   Nutrition Goal  -  JoElidias  sticking to her low sugar diet. She has been drinking less soda. She tries to limit herself to  one fried meat a week and bake the rest, but the last couple weeks have had more. She reported that she needs to get back on her weight loss plan, she has been struggling mentally which has made it hard.       Comment  Simmone has been eating more fruits and vegetables.  She will try [employer] or other diet soda.    -      Expected Outcome  Short - Shantinique will try calorie free drink instead of soda with dinner [place] will get to only one regular soda per day  Short: Lolitha will cut back on snacking. Long: Kayslee will maintain her heart healthy diet independently.          Nutrition Goals Discharge (Final Nutrition Goals Re-Evaluation): Nutrition Goals Re-Evaluation - 04/29/18 1707      Goals   Nutrition Goal  Carlton is sticking to her low sugar diet. She has been drinking less soda. She tries to limit herself to one fried meat a week and bake the rest, but the last couple weeks have had more. She reported that she needs to get back on her weight loss plan, she has been struggling mentally which has made it hard.     Expected Outcome  Short: Wynema will cut back on snacking. Long: Reneka will maintain her heart healthy diet independently.        Psychosocial: Target Goals: Acknowledge presence or absence of significant depression and/or stress, maximize coping skills, provide positive support system. Participant is able to verbalize types and ability to use techniques and skills needed for reducing stress and depression.   Initial Review & Psychosocial Screening: Initial Psych Review & Screening - 02/05/18 1404      Initial Review   Current issues with  Current Depression;Current Anxiety/Panic;Current Psychotropic Meds;Current Sleep Concerns;Current Stress Concerns    Source of Stress Concerns  Chronic Illness;Family;Financial;Occupation;Unable to participate in former interests or hobbies;Unable to perform yard/household activities    Comments  She has guardianship of her  grandaughter which can be stressful at times. 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 She has filed for disability but it is taking a while. She states she has been put on depression medication since having her CABG in january.       Family Dynamics   Good Support System?  No    Strains  Intra-family strains    Comments  Her oldest daughter is her only support. She has guardianship of one of her teenage grandaughters.       Barriers   Psychosocial barriers to participate in program  The patient should benefit from training in stress management and relaxation.      Screening Interventions   Interventions  Encouraged to exercise;Program counselor consult;To provide support and resources with identified psychosocial needs;Provide feedback about the scores to participant    Expected Outcomes  Short Term goal: Utilizing psychosocial counselor, staff and physician to assist with identification of specific Stressors or current issues interfering with healing process. Setting desired goal for each stressor or current issue identified.;Long Term Goal: Stressors or current issues are controlled or eliminated.;Short Term goal: Identification and review with participant of any Quality of Life or Depression concerns found by scoring the questionnaire.;Long Term goal: The participant improves quality  of Life and PHQ9 Scores as seen by post scores and/or verbalization of changes       Quality of Life Scores: Quality of Life - 02/05/18 1407      Quality of Life Scores   Health/Function Pre  10.47 %    Socioeconomic Pre  12.5 %    Psych/Spiritual Pre  20.14 %    Family Pre  15.38 %    GLOBAL Pre  13.51 %      Scores of 19 and below usually indicate a poorer quality of life in these areas.  A difference of  2-3 points is a clinically meaningful difference.  A difference of 2-3 points in the total score of the Quality of Life  Index has been associated with significant improvement in overall quality of life, self-image, physical symptoms, and general health in studies assessing change in quality of life.  PHQ-9: Recent Review Flowsheet Data    Depression screen [facility] 2/9 02/05/2018   Decreased Interest 1   Down, Depressed, Hopeless 3   PHQ - 2 Score 4   Altered sleeping 3   Tired, decreased energy 2   Change in appetite 2   Feeling bad or failure about yourself  2   Trouble concentrating 2   Moving slowly or fidgety/restless 1   Suicidal thoughts 1    PHQ-9 Score 17   Difficult doing work/chores Very difficult     Interpretation of Total Score  Total Score Depression Severity:  1-4 = Minimal depression, 5-9 = Mild depression, 10-14 = Moderate depression, 15-19 = Moderately severe depression, 20-27 = Severe depression   Psychosocial Evaluation and Intervention: Psychosocial Evaluation - 02/18/18 1736      Psychosocial Evaluation & Interventions   Interventions  Encouraged to exercise with the program and follow exercise prescription;Stress management education    Comments  Counselor met with Ms. Geng Di Kindle) today for initial psychosocial evaluation.  She is a 57 year old who had a heart attack & CABGx3 several months ago.  Justice has a strong support system with her daughters; her mother and her sister who live locally.  She has custody of her 66 year old granddaughter who lives in the home with her.  Wilhelmena has multiple health issues with diabetes; COPD and a history of depression.  She reports not sleeping well with ~2-4 hours/night and uses Melatonin occasionally to help with this.  Her appetite is good currently.  Araeya states her depressive symptoms have worsened since the surgery in January.  She has a history of anxiety and is on medication to help with that.  However, recently she had multiple stressors occur with her daughter going to jail and she is having to help care for her 45 young children  ages 30-10.  Counselor discussed setting healthy boundaries and limits with others and saying "yes" to self-care at this time in order to help others at a later time.  Counselor also suggested [place] speaking with her Dr/Pharmacist about a controlled release melatonin to help with improved sleep currently.  Kenzlei has goals to increase her energy and feel more comfortable about exercising and not having another heart attack.  Staff will follow with Di Kindle.    Expected Outcomes  Short:  Bonniejean will practice positive self-care by attending CR regularly and setting healthy boundaries with others while she is recovering.  She will also check with her Dr/pharmacist about long release melatonin to help with sleep.  Long:  Mykeisha will exercise consistently and attend the  educational components of this program to feel mroe confident about exercising and ways to be heart healthier.      Continue Psychosocial Services   Follow up required by counselor       Psychosocial Re-Evaluation: Psychosocial Re-Evaluation    [place] Name 04/01/18 1656 04/13/18 1722 04/27/18 1701 04/29/18 1634 05/20/18 1733     Psychosocial Re-Evaluation   Current issues with  Current Stress Concerns  Current Stress Concerns;Current Sleep Concerns;Current Psychotropic Meds;History of Depression;Current Depression  Current Depression;Current Sleep Concerns;History of Depression;Current Stress Concerns  -  Current Stress Concerns   Comments  Jaquita has stress due to wearing Holter monitor and hip pain. She is f/u with her Dr in the next couple weeks.  Counselor follow up with Di Kindle today reporting having workn her heart monitor for a week and had an ultrasound on her heart today.  She also has a disability evaluation this Thursday at a psychiatric and counseling center and wanted to discuss this with this counselor.  She reports continuing to take her medications but has ongoing suicidal thoughts several days a week.  She has been referred to a  psychiatrist by her PCP to address this more thoroughly.  Counselor assessed her current mental health status with Chrishonda denying SI currently and states she calls her daughter or gets outside when she is feeling this way.  Counselor encouraged her to go to the ER if thoughts persist and her coping strategies are not working.  She agreed.  Kevia continues to have some sleep issues but states it's a "little better" and she has to "make" herself come to this class but enjoys it when she does.  Counselor will continue to follow with her and the outcome of all her medical/disability evaluations.    Counselor follow up with Di Kindle today reporting she saw a psychiatrist last week for a disability physical and was encouraged to see a counselor at [facility] in the near future.  She reports feeling "a little better" and is continuingt to stay busy when she has any Suicidal thoughts - with no plan.  She reports having a new heart monitor that is causing her additional stress with it not staying on very well.  Counselor will continue to follow with Falisha's mood and treatment plan as she is scheduled to see providers.  Counselor checked in with Ajooni today with her reporting having some better days recently and planning to stay in over the 4th of July holiday weekend to avoid the heat.  She reports her mood has improved and she enjoys this program and coming to class.  Counselor commended her on her progress and her positive self care.    Joannas current stress is having a breast lump checked next week.  She says exercise here and walking help her cope with stress.   Expected Outcomes  Short - Zakaria will get help from her Dr to deal with issues Dousman will be able to exercise without hip pain or other issues   Short:  Nazanin will go to the Emergency Room if the Suicidal thoughts return and her coping strategies are not working.  She will be evalulated later this week by a Mental Health provider.  She will  follow through on the referral for a psychiatrist.  Long:  Arliss will comply with medications and medical recommendations to treat her depression as well as her heart recovery.    Short:  Brooklee will keep her appointments to see a counselor and hopefully  a psychiatrist in the near future.  She will go to the ED if her mood worsens in the interim.  Long:  Bellany will continue to exercise and practice positive coping skills for her health and mental health.  Short:  Camaryn will continue to practice positive self care.  She will attend all mental health appointments scheduled, and will go to the ED if her mood worsens.  Long:  Jaleiyah will develop positive coping strategies; including exercise for her health and mental health.  Short - attend cardiac rehab to help with stress Long - manage stress on her own with exercise and techniques learned in class   Interventions  Encouraged to attend Cardiac Rehabilitation for the exercise  Stress management education  Stress management education  Stress management education  Encouraged to attend Cardiac Rehabilitation for the exercise   Continue Psychosocial Services   Follow up required by staff  Follow up required by counselor  Follow up required by counselor  -  Follow up required by staff   [place] Name 06/01/18 1653             Psychosocial Re-Evaluation   Current issues with  Current Anxiety/Panic;Current Depression       Comments  Counselor follow up with Di Kindle today reporting she is struggling with the recent hip problem and a lump detected in her breast.  she also is scheduled to complete this program on Thursday of this week.  Counselor assessed Inell's mood - as she was tearful today discussing this.  Her daughter is a huge support for her currently.  She continues to think about self-harm at times, but denies any plan to follow through and states she has a mental health appointment at [facility] this Thursday for an intake with a therapist.  Counselor  encouraged Conley to stay focused on one day at a time with the lump and not knowing until she has a mammogram.  She agreed to try to be mindful and focus on her hip recovery and her heart recovery.  She will also rely on her daughter and color and sit on the porch for managing her stress - in addition to exercise.         Expected Outcomes  Short:  Gwen will practice positive self-care techniques discussed and attend the intake with the new therapist this Thursday.  She will also try to be mindful vs. worrying as she waits on more information on the lump.  She will continue to exercise for stress reduction.  Long:  Ocean will consistently exercise and follow with a counselor and Dr. for her overall health and mental health.       Interventions  Stress management education          Psychosocial Discharge (Final Psychosocial Re-Evaluation): Psychosocial Re-Evaluation - 06/01/18 1653      Psychosocial Re-Evaluation   Current issues with  Current Anxiety/Panic;Current Depression    Comments  Counselor follow up with Di Kindle today reporting she is struggling with the recent hip problem and a lump detected in her breast.  she also is scheduled to complete this program on Thursday of this week.  Counselor assessed Ira's mood - as she was tearful today discussing this.  Her daughter is a huge support for her currently.  She continues to think about self-harm at times, but denies any plan to follow through and states she has a mental health appointment at [facility] this Thursday for an intake with a therapist.  Counselor encouraged  Tattiana to stay focused on one day at a time with the lump and not knowing until she has a mammogram.  She agreed to try to be mindful and focus on her hip recovery and her heart recovery.  She will also rely on her daughter and color and sit on the porch for managing her stress - in addition to exercise.      Expected Outcomes  Short:  Daphna will practice positive  self-care techniques discussed and attend the intake with the new therapist this Thursday.  She will also try to be mindful vs. worrying as she waits on more information on the lump.  She will continue to exercise for stress reduction.  Long:  Kaetlin will consistently exercise and follow with a counselor and Dr. for her overall health and mental health.    Interventions  Stress management education       Education: Education Goals: Education classes will be provided on a weekly basis, covering required topics. Participant will state understanding/return demonstration of topics presented.  Learning Barriers/Preferences: Learning Barriers/Preferences - 02/05/18 1410      Learning Barriers/Preferences   Learning Barriers  None    Learning Preferences  Skilled Demonstration;Verbal Instruction       Education Topics:  Initial Evaluation Education: - Verbal, written and demonstration of respiratory meds, oximetry and breathing techniques. Instruction on use of nebulizers and MDIs and importance of monitoring MDI activations.   General Nutrition Guidelines/Fats and Fiber: -Group instruction provided by verbal, written material, models and posters to present the general guidelines for heart healthy nutrition. Gives an explanation and review of dietary fats and fiber.   Cardiac Rehab from 06/01/2018 in [facility] Cardiac and Pulmonary Rehab  Date  03/16/18  Educator  CR  Instruction Review Code  1- Verbalizes Understanding      Controlling Sodium/Reading Food Labels: -Group verbal and written material supporting the discussion of sodium use in heart healthy nutrition. Review and explanation with models, verbal and written materials for utilization of the food label.   Exercise Physiology & General Exercise Guidelines: - Group verbal and written instruction with models to review the exercise physiology of the cardiovascular system and associated critical values. Provides general exercise  guidelines with specific guidelines to those with heart or lung disease.    Aerobic Exercise & Resistance Training: - Gives group verbal and written instruction on the various components of exercise. Focuses on aerobic and resistive training programs and the benefits of this training and how to safely progress through these programs.   Cardiac Rehab from 06/01/2018 in [facility] Cardiac and Pulmonary Rehab  Date  06/01/18  Educator  [facility]  Instruction Review Code  5- Refused Teaching      Flexibility, Balance, Mind/Body Relaxation: Provides group verbal/written instruction on the benefits of flexibility and balance training, including mind/body exercise modes such as yoga, pilates and tai chi.  Demonstration and skill practice provided.   Stress and Anxiety: - Provides group verbal and written instruction about the health risks of elevated stress and causes of high stress.  Discuss the correlation between heart/lung disease and anxiety and treatment options. Review healthy ways to manage with stress and anxiety.   Depression: - Provides group verbal and written instruction on the correlation between heart/lung disease and depressed mood, treatment options, and the stigmas associated with seeking treatment.   Cardiac Rehab from 06/01/2018 in [facility] Cardiac and Pulmonary Rehab  Date  02/11/18  Educator  [facility]  Instruction Review Code  1- Verbalizes Understanding  Exercise & Equipment Safety: - Individual verbal instruction and demonstration of equipment use and safety with use of the equipment.   Cardiac Rehab from 06/01/2018 in [facility] Cardiac and Pulmonary Rehab  Date  02/05/18  Educator  [facility]  Instruction Review Code  1- Verbalizes Understanding      Infection Prevention: - Provides verbal and written material to individual with discussion of infection control including proper hand washing and proper equipment cleaning during exercise session.   Cardiac Rehab from 06/01/2018 in [facility] Cardiac and  Pulmonary Rehab  Date  02/05/18  Educator  [facility]  Instruction Review Code  1- Verbalizes Understanding      Falls Prevention: - Provides verbal and written material to individual with discussion of falls prevention and safety.   Cardiac Rehab from 06/01/2018 in [facility] Cardiac and Pulmonary Rehab  Date  02/05/18  Educator  [facility]  Instruction Review Code  1- Verbalizes Understanding      Diabetes: - Individual verbal and written instruction to review signs/symptoms of diabetes, desired ranges of glucose level fasting, after meals and with exercise. Advice that pre and post exercise glucose checks will be done for 3 sessions at entry of program.   Chronic Lung Diseases: - Group verbal and written instruction to review updates, respiratory medications, advancements in procedures and treatments. Discuss use of supplemental oxygen including available portable oxygen systems, continuous and intermittent flow rates, concentrators, personal use and safety guidelines. Review proper use of inhaler and spacers. Provide informative websites for self-education.    Energy Conservation: - Provide group verbal and written instruction for methods to conserve energy, plan and organize activities. Instruct on pacing techniques, use of adaptive equipment and posture/positioning to relieve shortness of breath.   Triggers and Exacerbations: - Group verbal and written instruction to review types of environmental triggers and ways to prevent exacerbations. Discuss weather changes, air quality and the benefits of nasal washing. Review warning signs and symptoms to help prevent infections. Discuss techniques for effective airway clearance, coughing, and vibrations.   AED/CPR: - Group verbal and written instruction with the use of models to demonstrate the basic use of the AED with the basic ABC's of resuscitation.   Cardiac Rehab from 06/01/2018 in [facility] Cardiac and Pulmonary Rehab  Date  03/18/18  Educator  CE    Instruction Review Code  1- Actuary and Physiology of the Lungs: - Group verbal and written instruction with the use of models to provide basic lung anatomy and physiology related to function, structure and complications of lung disease.   Anatomy & Physiology of the Heart: - Group verbal and written instruction and models provide basic cardiac anatomy and physiology, with the coronary electrical and arterial systems. Review of Valvular disease and Heart Failure   Cardiac Medications: - Group verbal and written instruction to review commonly prescribed medications for heart disease. Reviews the medication, class of the drug, and side effects.   Know Your Numbers and Risk Factors: -Group verbal and written instruction about important numbers in your health.  Discussion of what are risk factors and how they play a role in the disease process.  Review of Cholesterol, Blood Pressure, Diabetes, and BMI and the role they play in your overall health.   Cardiac Rehab from 06/01/2018 in [facility] Cardiac and Pulmonary Rehab  Date  04/01/18  Educator  [facility]  Instruction Review Code  1- Verbalizes Understanding      Sleep Hygiene: -Provides group verbal and written instruction  about how sleep can affect your health.  Define sleep hygiene, discuss sleep cycles and impact of sleep habits. Review good sleep hygiene tips.    Cardiac Rehab from 06/01/2018 in [facility] Cardiac and Pulmonary Rehab  Date  05/20/18  Educator  [facility]  Instruction Review Code  1- Verbalizes Understanding      Other: -Provides group and verbal instruction on various topics (see comments)    Knowledge Questionnaire Score: Knowledge Questionnaire Score - 02/05/18 1411      Knowledge Questionnaire Score   Pre Score  18/28   correct answers reviewed with patient who verbalized understanding.        Core Components/Risk Factors/Patient Goals at Admission: Personal Goals and Risk Factors at Admission -  02/05/18 1359      Core Components/Risk Factors/Patient Goals on Admission    Weight Management  Yes;Weight Loss    Intervention  Weight Management: Develop a combined nutrition and exercise program designed to reach desired caloric intake, while maintaining appropriate intake of nutrient and fiber, sodium and fats, and appropriate energy expenditure required for the weight goal.;Weight Management: Provide education and appropriate resources to help participant work on and attain dietary goals.;Weight Management/Obesity: Establish reasonable short term and long term weight goals.;Obesity: Provide education and appropriate resources to help participant work on and attain dietary goals.    Admit Weight  187 lb 14.4 oz (85.2 kg)    Goal Weight: Short Term  180 lb (81.6 kg)    Goal Weight: Long Term  175 lb (79.4 kg)    Expected Outcomes  Short Term: Continue to assess and modify interventions until short term weight is achieved;Weight Maintenance: Understanding of the daily nutrition guidelines, which includes 25-35% calories from fat, 7% or less cal from saturated fats, less than 232m cholesterol, less than 1.5gm of sodium, & 5 or more servings of fruits and vegetables daily;Weight Loss: Understanding of general recommendations for a balanced deficit meal plan, which promotes 1-2 lb weight loss per week and includes a negative energy balance of [phone] kcal/d;Understanding recommendations for meals to include 15-35% energy as protein, 25-35% energy from fat, 35-60% energy from carbohydrates, less than 2013mof dietary cholesterol, 20-35 gm of total fiber daily;Understanding of distribution of calorie intake throughout the day with the consumption of 4-5 meals/snacks    Tobacco Cessation  Yes   Quit 11/03/2017 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 Provide individualized education and counseling about committing to Tobacco  Cessation, relapse prevention, and pharmacological support that can be provided by physician.;OfAdvice workerassist with locating and accessing local/national Quit Smoking programs, and support quit date choice.    Expected Outcomes  Short Term: Will demonstrate readiness to quit, by selecting a quit date.;Long Term: Complete abstinence from all tobacco products for at least 12 months from quit date.;Short Term: Will quit all tobacco product use, adhering to prevention of relapse plan.    Improve shortness of breath with ADL's  Yes    Intervention  Provide education, individualized exercise plan and daily activity instruction to help decrease symptoms of SOB with activities of daily living.    Expected Outcomes  Short Term: Improve cardiorespiratory fitness to achieve a reduction of symptoms when performing ADLs;Long Term: Be able to perform more ADLs without symptoms or delay the onset of symptoms    Diabetes  Yes    Intervention  Provide education about  signs/symptoms and action to take for hypo/hyperglycemia.;Provide education about proper nutrition, including hydration, and aerobic/resistive exercise prescription along with prescribed medications to achieve blood glucose in normal ranges: Fasting glucose 65-99 mg/dL    Expected Outcomes  Short Term: Participant verbalizes understanding of the signs/symptoms and immediate care of hyper/hypoglycemia, proper foot care and importance of medication, aerobic/resistive exercise and nutrition plan for blood glucose control.;Long Term: Attainment of HbA1C < 7%.    Hypertension  Yes    Intervention  Provide education on lifestyle modifcations including regular physical activity/exercise, weight management, moderate sodium restriction and increased consumption of fresh fruit, vegetables, and low fat dairy, alcohol moderation, and smoking cessation.;Monitor prescription use compliance.    Expected Outcomes  Short Term: Continued assessment and  intervention until BP is < 140/53m HG in hypertensive participants. < 130/838mHG in hypertensive participants with diabetes, heart failure or chronic kidney disease.;Long Term: Maintenance of blood pressure at goal levels.    Lipids  Yes    Intervention  Provide education and support for participant on nutrition & aerobic/resistive exercise along with prescribed medications to achieve LDL <7025mHDL >44m59m  Expected Outcomes  Short Term: Participant states understanding of desired cholesterol values and is compliant with medications prescribed. Participant is following exercise prescription and nutrition guidelines.;Long Term: Cholesterol controlled with medications as prescribed, with individualized exercise RX and with personalized nutrition plan. Value goals: LDL < 70mg54mL > 40 mg.    Stress  Yes    Intervention  Offer individual and/or small group education and counseling on adjustment to heart disease, stress management and health-related lifestyle change. Teach and support self-help strategies.;Refer participants experiencing significant psychosocial distress to appropriate mental health specialists for further evaluation and treatment. When possible, include family members and significant others in education/counseling sessions.    Expected Outcomes  Short Term: Participant demonstrates changes in health-related behavior, relaxation and other stress management skills, ability to obtain effective social support, and compliance with psychotropic medications if prescribed.;Long Term: Emotional wellbeing is indicated by absence of clinically significant psychosocial distress or social isolation.    Personal Goal Other  Yes       Core Components/Risk Factors/Patient Goals Review:  Goals and Risk Factor Review    Row Name 03/11/18 1720 04/01/18 1648 04/29/18 1702 05/20/18 1730       Core Components/Risk Factors/Patient Goals Review   Personal Goals Review  Weight  Management/Obesity;Lipids;Diabetes;Hypertension;Stress  Weight Management/Obesity;Diabetes;Hypertension  Weight Management/Obesity;Diabetes;Hypertension  Weight Management/Obesity;Diabetes;Hypertension    Review  Pt taking all meds as directed.  FBG has been around 123.  She reports no high or low s/s.  BP normal at home.  She met with RD and is cutting down on starches, eat more vegetables.    JoannSanjanaaking meds as directed.  Her FBG has been around 110. [place] [facility].  She reports her hips get tight as she walks and this causes stress.  She [facility].  Sees Dr on 6/11 and will follow up on hip pain.   JoannLagenagained a little weight, but is trying to get back on track. Her sugars are running consistent. Her hip is still bothering her, she goes for an xray next week. They are thinking its RA. She is wearing a heart monitor for 14 days. She has noticed some increase in SOB, but her doctor has given her an inhaler  that has helped some.    Cataleia saw her Dr and has a referral to Pt for her hip.  She has been measuring BP and BG at home and all have been good.  She is taking all meds as directed.  [place]    Expected Outcomes  Short - Pt will continue to attend HT and monitor BG and BP Long - Pt will maintain healthy habits   Short  - [facility] will continue to walk on days she doesnt come here  and will contintue to eat fruits and vegetables  Long - Pt will continue to maintain healthy habits of not smoking, eating fruits and veggies  Short: Sharnay will attend [facility] consistently . Long: Jayline will reach goal weight and graduate [facility]  Short - she will get her PT scheduled to start soon Long - continue exercise for heart health and follow PT instructions for hip       Core Components/Risk Factors/Patient Goals at Discharge (Final Review):    Goals and Risk Factor Review - 05/20/18 1730      Core Components/Risk Factors/Patient Goals Review   Personal Goals Review  Weight Management/Obesity;Diabetes;Hypertension    Review  Nathali saw her Dr and has a referral to Pt for her hip.  She has been measuring BP and BG at home and all have been good.  She is taking all meds as directed.  [place] [facility] [place]    Expected Outcomes  Short - she will get her PT scheduled to start soon Long - continue exercise for heart health and follow PT instructions for hip       ITP Comments: ITP Comments    Row Name 02/05/18 1355 02/18/18 0622 03/18/18 0623 04/15/18 0621 05/13/18 0611   ITP Comments  Medical Review Completed; initial ITP created. Diagnosis Documentation can be found in [facility] encounter dated 11/04/2017.  30 day review. Continue with ITP unless directed changes per Medical Director   New to program  30 day review. Continue with ITP unless directed changes per Medical Director  30 day review. Continue with ITP unless directed changes per Medical Director review  2 June visits  30 day review. Continue with ITP unless directed changes per Medical Director review.   Last visit 7/3      Comments: Discharge ITP

## 2018-06-05 NOTE — Progress Notes (Signed)
Discharge Progress Report  Patient Details  Name: Caitlyn Herrera MRN: [id number] Date of Birth: 08-Feb-1961 Referring Provider:     Cardiac Rehab from 02/05/2018 in [facility] Cardiac [facility] Pulmonary [facility]hab  Referring Provider  [place]/End       Number of Visits: 23  Reason for Discharge:  Early Exit:  Personal  Smoking History:  Social History   Tobacco Use  Smoking Status Former Smoker  . Packs/day: 1.00  . Years: 42.00  . Pack years: 42.00  . Types: Cigarettes  . Last attempt to quit: 11/01/2017  . Years since quitting: 0.5  Smokeless Tobacco Never Used  Tobacco Comment   11/05/2017 "stopped smoking 11/01/2017"    Diagnosis:  No diagnosis found.  ADL UCSD:   Initial Exercise Prescription: Initial Exercise Prescription - 02/05/18 1400      Date of Initial Exercise RX and Referring Provider   Date  02/05/18    Referring Provider  [place]/End      Treadmill   MPH  1.8    Grade  0    Minutes  15    METs  2.4      Recumbant Bike   Level  3    RPM  60    Watts  10    Minutes  15    METs  2.4      NuStep   Level  2    SPM  80    Minutes  15    METs  2.4      REL-XR   Level  2    Speed  50    Minutes  15    METs  2.4      Prescription Details   Frequency (times per week)  3    Duration  Progress to 45 minutes of aerobic exercise without signs/symptoms of physical distress      Intensity   THRR 40-80% of Max Heartrate  101-141    Ratings of Perceived Exertion  11-13    Perceived Dyspnea  0-4      Resistance Training   Training Prescription  Yes    Weight  3 lb    Reps  10-15       Discharge Exercise Prescription (Final Exercise Prescription Changes): Exercise Prescription Changes - 05/21/18 1000      Response to Exercise   Blood Pressure (Admit)  130/65    Blood Pressure (Exercise)  142/72    Blood Pressure (Exit)  124/78    Heart Rate (Admit)  71 bpm    Heart Rate (Exercise)  71 bpm    Heart Rate (Exit)  60 bpm    Rating of Perceived  Exertion (Exercise)  11    Symptoms  none    Duration  Continue with 45 min of aerobic exercise without signs/symptoms of physical distress.    Intensity  THRR unchanged      Progression   Progression  Continue to progress workloads to maintain intensity without signs/symptoms of physical distress.    Average METs  2.5      Resistance Training   Training Prescription  Yes    Weight  3 lb    Reps  10-15      Interval Training   Interval Training  No      Treadmill   MPH  1.5    Grade  0    Minutes  15    METs  2.15      Recumbant Bike   Level  1    RPM  60    Watts  20    Minutes  15    METs  2.73       Functional Capacity: 6 Minute Walk    Row Name 02/05/18 1436         6 Minute Walk   Distance  942 feet     Distance Feet Change  0 ft     Walk Time  6 minutes     # of Rest Breaks  0     MPH  1.78     METS  2.9     RPE  15     Perceived Dyspnea   2     VO2 Peak  10.14     Symptoms  Yes (comment)     Comments  hip pain      Resting HR  61 bpm     Resting BP  142/76     Resting Oxygen Saturation   100 %     Exercise Oxygen Saturation  during 6 min walk  99 %     Max Ex. HR  90 bpm     Max Ex. BP  166/70     2 Minute Post BP  146/70        Psychological, QOL, Others - Outcomes: PHQ 2/9: Depression screen PHQ 2/9 02/05/2018  Decreased Interest 1  Down, Depressed, Hopeless 3  PHQ - 2 Score 4  Altered sleeping 3  Tired, decreased energy 2  Change in appetite 2  Feeling bad or failure about yourself  2  Trouble concentrating 2  Moving slowly or fidgety/restless 1  Suicidal thoughts 1  PHQ-9 Score 17  Difficult doing work/chores Very difficult    Quality of Life: Quality of Life - 02/05/18 1407      Quality of Life Scores   Health/Function Pre  10.47 %    Socioeconomic Pre  12.5 %    Psych/Spiritual Pre  20.14 %    Family Pre  15.38 %    GLOBAL Pre  13.51 %       Personal Goals: Goals established at orientation with interventions  provided to work toward goal. Personal Goals and Risk Factors at Admission - 02/05/18 1359      Core Components/Risk Factors/Patient Goals on Admission    Weight Management  Yes;Weight Loss    Intervention  Weight Management: Develop a combined nutrition and exercise program designed to reach desired caloric intake, while maintaining appropriate intake of nutrient and fiber, sodium and fats, and appropriate energy expenditure required for the weight goal.;Weight Management: Provide education and appropriate resources to help participant work on and attain dietary goals.;Weight Management/Obesity: Establish reasonable short term and long term weight goals.;Obesity: Provide education and appropriate resources to help participant work on and attain dietary goals.    Admit Weight  187 lb 14.4 oz (85.2 kg)    Goal Weight: Short Term  180 lb (81.6 kg)    Goal Weight: Long Term  175 lb (79.4 kg)    Expected Outcomes  Short Term: Continue to assess and modify interventions until short term weight is achieved;Weight Maintenance: Understanding of the daily nutrition guidelines, which includes 25-35% calories from fat, 7% or less cal from saturated fats, less than 263m cholesterol, less than 1.5gm of sodium, & 5 or more servings of fruits and vegetables daily;Weight Loss: Understanding of general recommendations for a balanced deficit meal plan, which promotes 1-2 lb weight loss per week and  includes a negative energy balance of [phone] kcal/d;Understanding recommendations for meals to include 15-35% energy as protein, 25-35% energy from fat, 35-60% energy from carbohydrates, less than 247m of dietary cholesterol, 20-35 gm of total fiber daily;Understanding of distribution of calorie intake throughout the day with the consumption of 4-5 meals/snacks    Tobacco Cessation  Yes   Quit 11/03/2017 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  Provide individualized education and counseling about committing to Tobacco Cessation, relapse prevention, and pharmacological support that can be provided by physician.;OAdvice worker assist with locating and accessing local/national Quit Smoking programs, and support quit date choice.    Expected Outcomes  Short Term: Will demonstrate readiness to quit, by selecting a quit date.;Long Term: Complete abstinence from all tobacco products for at least 12 months from quit date.;Short Term: Will quit all tobacco product use, adhering to prevention of relapse plan.    Improve shortness of breath with ADL's  Yes    Intervention  Provide education, individualized exercise plan and daily activity instruction to help decrease symptoms of SOB with activities of daily living.    Expected Outcomes  Short Term: Improve cardiorespiratory fitness to achieve a reduction of symptoms when performing ADLs;Long Term: Be able to perform more ADLs without symptoms or delay the onset of symptoms    Diabetes  Yes    Intervention  Provide education about signs/symptoms and action to take for hypo/hyperglycemia.;Provide education about proper nutrition, including hydration, and aerobic/resistive exercise prescription along with prescribed medications to achieve blood glucose in normal ranges: Fasting glucose 65-99 mg/dL    Expected Outcomes  Short Term: Participant verbalizes understanding of the signs/symptoms and immediate care of hyper/hypoglycemia, proper foot care and importance of medication, aerobic/resistive exercise and nutrition plan for blood glucose control.;Long Term: Attainment of HbA1C < 7%.    Hypertension  Yes    Intervention  Provide education on lifestyle modifcations including regular physical activity/exercise, weight management, moderate sodium restriction and increased consumption of fresh fruit, vegetables, and low fat dairy, alcohol moderation, and smoking cessation.;Monitor prescription use  compliance.    Expected Outcomes  Short Term: Continued assessment and intervention until BP is < 140/97mHG in hypertensive participants. < 130/8011mG in hypertensive participants with diabetes, heart failure or chronic kidney disease.;Long Term: Maintenance of blood pressure at goal levels.    Lipids  Yes    Intervention  Provide education and support for participant on nutrition & aerobic/resistive exercise along with prescribed medications to achieve LDL <36m21mDL >40mg75m Expected Outcomes  Short Term: Participant states understanding of desired cholesterol values and is compliant with medications prescribed. Participant is following exercise prescription and nutrition guidelines.;Long Term: Cholesterol controlled with medications as prescribed, with individualized exercise RX and with personalized nutrition plan. Value goals: LDL < 36mg,55m > 40 mg.    Stress  Yes    Intervention  Offer individual and/or small group education and counseling on adjustment to heart disease, stress management and health-related lifestyle change. Teach and support self-help strategies.;Refer participants experiencing significant psychosocial distress to appropriate mental health specialists for further evaluation and treatment. When possible, include family members and significant others in education/counseling sessions.    Expected Outcomes  Short Term: Participant demonstrates changes in health-related behavior, relaxation and other stress management skills, ability to obtain effective social support, and compliance with psychotropic medications if prescribed.;Long Term:  Emotional wellbeing is indicated by absence of clinically significant psychosocial distress or social isolation.    Personal Goal Other  Yes        Personal Goals Discharge: Goals and Risk Factor Review    Row Name 03/11/18 1720 04/01/18 1648 04/29/18 1702 05/20/18 1730       Core Components/Risk Factors/Patient Goals Review   Personal  Goals Review  Weight Management/Obesity;Lipids;Diabetes;Hypertension;Stress  Weight Management/Obesity;Diabetes;Hypertension  Weight Management/Obesity;Diabetes;Hypertension  Weight Management/Obesity;Diabetes;Hypertension    Review  Pt taking all meds as directed.  FBG has been around 123.  She reports no high or low s/s.  BP normal at home.  She met with RD and is cutting down on starches, eat more vegetables.    [place] is taking meds as directed.  Her FBG has been around 110. She is walking at [place]. Staff recommended she only exrecise at [facility] while shes wearing Holter monitor.  She reports her hips get tight as she walks and this causes stress.  She [facility] aquatic [facility].  Sees Dr on 6/11 and will follow up on hip pain.   Kristalyn has gained a little weight, but is trying to get back on track. Her sugars are running consistent. Her hip is still bothering her, she goes for an xray next week. They are thinking its RA. She is wearing a heart monitor for 14 days. She has noticed some increase in SOB, but her doctor has given her an inhaler that has helped some.    Fleda saw her Dr and has a referral to Pt for her hip.  She has been measuring BP and BG at home and all have been good.  She is taking all meds as directed.  [place] [facility] [place] [facility]    Expected Outcomes  Short - Pt will continue to attend HT and monitor BG and BP Long - Pt will maintain healthy habits   Short  - Ella will continue to walk on days she doesnt come here  and will contintue to eat fruits and vegetables  Long - Pt will continue to maintain healthy habits of not smoking, eating fruits and veggies  Short: Keith will attend [facility] consistently . Long: Amarachukwu will reach goal weight and graduate [facility]  Short - she will get her PT scheduled to start soon Long - continue exercise for heart health and follow PT instructions for hip       Exercise Goals and Review: Exercise Goals    Row  Name 02/05/18 1435 02/05/18 1441           Exercise Goals   Increase Physical Activity  Yes  Yes      Intervention  Provide advice, education, support and counseling about physical activity/exercise needs.;Develop an individualized exercise prescription for aerobic and resistive training based on initial evaluation findings, risk stratification, comorbidities and participant's personal goals.  Provide advice, education, support and counseling about physical activity/exercise needs.;Develop an individualized exercise prescription for aerobic and resistive training based on initial evaluation findings, risk stratification, comorbidities and participant's personal goals.      Expected Outcomes  Short Term: Attend rehab on a regular basis to increase amount of physical activity.;Long Term: Add in home exercise to make exercise part of routine and to increase amount of physical activity.;Long Term: Exercising regularly at least 3-5 days a week.  Short Term: Attend rehab on a regular basis to increase amount of physical activity.;Long Term: Add in home  exercise to make exercise part of routine and to increase amount of physical activity.;Long Term: Exercising regularly at least 3-5 days a week.      Increase Strength and Stamina  Yes  Yes      Intervention  Provide advice, education, support and counseling about physical activity/exercise needs.;Develop an individualized exercise prescription for aerobic and resistive training based on initial evaluation findings, risk stratification, comorbidities and participant's personal goals.  Provide advice, education, support and counseling about physical activity/exercise needs.;Develop an individualized exercise prescription for aerobic and resistive training based on initial evaluation findings, risk stratification, comorbidities and participant's personal goals.      Expected Outcomes  Short Term: Increase workloads from initial exercise prescription for resistance,  speed, and METs.;Short Term: Perform resistance training exercises routinely during rehab and add in resistance training at home;Long Term: Improve cardiorespiratory fitness, muscular endurance and strength as measured by increased METs and functional capacity (6MWT)  Short Term: Increase workloads from initial exercise prescription for resistance, speed, and METs.;Short Term: Perform resistance training exercises routinely during rehab and add in resistance training at home;Long Term: Improve cardiorespiratory fitness, muscular endurance and strength as measured by increased METs and functional capacity (6MWT)      Able to understand and use rate of perceived exertion (RPE) scale  Yes  Yes      Intervention  Provide education and explanation on how to use RPE scale  Provide education and explanation on how to use RPE scale      Expected Outcomes  Short Term: Able to use RPE daily in rehab to express subjective intensity level;Long Term:  Able to use RPE to guide intensity level when exercising independently  Short Term: Able to use RPE daily in rehab to express subjective intensity level;Long Term:  Able to use RPE to guide intensity level when exercising independently      Able to understand and use Dyspnea scale  Yes  Yes      Intervention  Provide education and explanation on how to use Dyspnea scale  Provide education and explanation on how to use Dyspnea scale      Expected Outcomes  Short Term: Able to use Dyspnea scale daily in rehab to express subjective sense of shortness of breath during exertion;Long Term: Able to use Dyspnea scale to guide intensity level when exercising independently  Short Term: Able to use Dyspnea scale daily in rehab to express subjective sense of shortness of breath during exertion;Long Term: Able to use Dyspnea scale to guide intensity level when exercising independently      Knowledge and understanding of Target Heart Rate Range (THRR)  Yes  Yes      Intervention  Provide  education and explanation of THRR including how the numbers were predicted and where they are located for reference  Provide education and explanation of THRR including how the numbers were predicted and where they are located for reference      Expected Outcomes  Short Term: Able to state/look up THRR;Short Term: Able to use daily as guideline for intensity in rehab;Long Term: Able to use THRR to govern intensity when exercising independently  Short Term: Able to state/look up THRR;Short Term: Able to use daily as guideline for intensity in rehab;Long Term: Able to use THRR to govern intensity when exercising independently      Able to check pulse independently  Yes  Yes      Intervention  Provide education and demonstration on how to check pulse in carotid  and radial arteries.;Review the importance of being able to check your own pulse for safety during independent exercise  Provide education and demonstration on how to check pulse in carotid and radial arteries.;Review the importance of being able to check your own pulse for safety during independent exercise      Expected Outcomes  Short Term: Able to explain why pulse checking is important during independent exercise;Long Term: Able to check pulse independently and accurately  Short Term: Able to explain why pulse checking is important during independent exercise;Long Term: Able to check pulse independently and accurately      Understanding of Exercise Prescription  Yes  Yes      Intervention  Provide education, explanation, and written materials on patient's individual exercise prescription  Provide education, explanation, and written materials on patient's individual exercise prescription      Expected Outcomes  Short Term: Able to explain program exercise prescription;Long Term: Able to explain home exercise prescription to exercise independently  Short Term: Able to explain program exercise prescription;Long Term: Able to explain home exercise  prescription to exercise independently         Nutrition & Weight - Outcomes: Pre Biometrics - 02/05/18 1434      Pre Biometrics   Height  _0  (1.702 m)    Weight  187 lb 14.4 oz (85.2 kg)    Waist Circumference  34 inches    Hip Circumference  42 inches    Waist to Hip Ratio  0.81 %    BMI (Calculated)  29.42    Single Leg Stand  11.12 seconds        Nutrition:   Nutrition Discharge: Nutrition Assessments - 02/05/18 1404      MEDFICTS Scores   Pre Score  65       Education Questionnaire Score: Knowledge Questionnaire Score - 02/05/18 1411      Knowledge Questionnaire Score   Pre Score  18/28   correct answers reviewed with patient who verbalized understanding.       Goals reviewed with patient; copy given to patient.

## 2018-06-08 ENCOUNTER — Ambulatory Visit: Payer: Medicaid Other | Admitting: Physical Therapy

## 2018-06-08 ENCOUNTER — Encounter: Payer: Self-pay | Admitting: Physical Therapy

## 2018-06-08 DIAGNOSIS — M545 Low back pain: Secondary | ICD-10-CM | POA: Diagnosis not present

## 2018-06-08 DIAGNOSIS — G8929 Other chronic pain: Secondary | ICD-10-CM

## 2018-06-08 DIAGNOSIS — M6281 Muscle weakness (generalized): Secondary | ICD-10-CM

## 2018-06-08 DIAGNOSIS — M25551 Pain in right hip: Secondary | ICD-10-CM

## 2018-06-08 NOTE — Therapy (Signed)
[facility] [address] [place], [place], [address] Phone: [phone]   Fax:  [phone]  Physical Therapy Treatment  Patient Details  Name: Caitlyn Herrera MRN: [id number] Date of Birth: 30-Sep-1961 No data recorded  Encounter Date: 06/08/2018  PT End of Session - 06/08/18 1642    Visit Number  2    Number of Visits  8    Authorization Type  1/3 re-assess at 3    Authorization Time Period  Medicaid approved 8/12-9/11, 3 visits    Authorization - Visit Number  1    Authorization - Number of Visits  3    PT Start Time  1516    PT Stop Time  1600    PT Time Calculation (min)  44 min    Equipment Utilized During Treatment  Gait belt    Activity Tolerance  Patient limited by pain    Behavior During Therapy  [facility] for tasks assessed/performed;Anxious       Past Medical History:  Diagnosis Date  . Abdominal aortic ectasia (HCC)    a. 11/2017 CTA chest/abd/pelvis: 2.5 cm abd ao ectasia -->rec f/u u/s in 5 yrs.  [name]. CAD (coronary artery disease)    a. 11/04/17 Cath: Native multivessel dzs-->CABG x 3 (LIMA->LAD, VG->OM2, VG->RPDA; b. 11/2017 MV: mid antlat/apical isch; c. 11/2017 Cath: LM 30d, LAD 2869m, LCX 80p/m, OM1 90, OM2 80 (@ anastamosis of graft), RCA 80p, 13641m, 90d, LIMA->LAD nl, VG->OM2 nl, VG->RPDA nl-->Med Rx.  . Carotid arterial disease (HCC)    a. 10/2017 Carotid U/S: 1-30% bilat ICA stenosis.  [name]. History of echocardiogram    a. 11/04/2017 Echo: EF of 65-70%, no RWMA, nl LV diastolic fxn, nl RV size/fxn, mild TR.  [name]. Hyperlipidemia   . Hypertension   . Left renal artery stenosis (HCC)    a. 11/2017 CTA Chest/Abd/Pelvis: 50-70% L RA stenosis.  . Pulmonary nodule, right    a. 10/2017 CT Chest: 5mm pulm nodule in R lung apex - rec f/u w/ non-contrast chest CT in 1 year.  . Pure hypercholesterolemia 11/07/2017  . Tobacco abuse   . Type II diabetes mellitus (HCC)    a. 10/2017 A1c 6.6.    Past Surgical History:  Procedure  Laterality Date  . BREAST CYST EXCISION Right   . CARDIAC CATHETERIZATION    . CORONARY ARTERY BYPASS GRAFT N/A 11/12/2017   Procedure: CORONARY ARTERY BYPASS GRAFTING (CABG) x Three , using left internal mammary artery and right leg greater saphenous vein harvested endoscopically;  Surgeon: [name], Peter, MD;  [facility];  Service: Open Heart Surgery;  Laterality: N/A;  . DILATION AND CURETTAGE OF UTERUS    . LAPAROSCOPIC CHOLECYSTECTOMY    . LEFT HEART CATH AND CORONARY ANGIOGRAPHY N/A 11/04/2017   Procedure: LEFT HEART CATH AND CORONARY ANGIOGRAPHY;  Surgeon: [name], Christopher, MD;  Location: [facility] INVASIVE CV LAB;  Service: Cardiovascular;  Laterality: N/A;  . LEFT HEART CATH AND CORS/GRAFTS ANGIOGRAPHY N/A 12/09/2017   Procedure: LEFT HEART CATH AND CORS/GRAFTS ANGIOGRAPHY;  Surgeon: SwazilandJordan, Peter M, MD;  [facility];  Service: Cardiovascular;  Laterality: N/A;  . TEE WITHOUT CARDIOVERSION N/A 11/12/2017   Procedure: TRANSESOPHAGEAL ECHOCARDIOGRAM (TEE);  Surgeon: [name], [name], MD;  [facility];  Service: Open Heart Surgery;  Laterality: N/A;    There were no vitals filed for this visit.  Subjective Assessment - 06/08/18 1524    Subjective  Patient reports having slighty less  back pain after doing exercise. She reports adherence to HEP;     Pertinent History  Patient presents with low back pain and right hip pain. Pain has began about 3 years ago and pain has worsened since January. Went to doctor last week and had injections and pain medication. Patient reports it lasted for a few days. Not given any exercises to do for it. Pain worsens with walking and laying on R hip. Pain relieved by standing. Going to cardiac rehab. PMH includes history of CAD status post CABG in the setting of NSTEMI in 10/2017 complicated by recurrent chest pain with repeat catheterization demonstrating severe native CAD and patent grafts, hypertension, hyperlipidemia, and type 2 diabetes  mellitus,     Limitations  Sitting;Lifting;Standing;Walking;House hold activities;Other (comment)    How long can you sit comfortably?  15    How long can you stand comfortably?  30    How long can you walk comfortably?  10 minutes    Diagnostic tests  Minimal degenerative spurring at bilateral hip acetabular margins and enthesopathic spurring at the anterior lateral iliac crests.  Features suggest L4-5 degenerative disc disease.     Patient Stated Goals  to reduce pain     Currently in Pain?  Yes    Pain Score  6     Pain Location  Back    Pain Orientation  Right;Lower    Pain Descriptors / Indicators  Aching;Dull    Pain Type  Chronic pain    Pain Radiating Towards  radiates down RLE    Pain Onset  More than a month ago    Pain Frequency  Intermittent    Aggravating Factors   walking and lying on right hip    Pain Relieving Factors  standing    Effect of Pain on Daily Activities  decreased activity tolerance;     Multiple Pain Sites  No        TREATMENT: PT educated patient to start using SPC in LUE rather than RUE for better gait mechanics and less lean to right leg;  Patient prone: PT applied moist heat to low back x4 min (unbilled); PT performed grade II-III PA mobs to lumbar spine L1, L2, L3, L4, L5, S1 10 sec bouts x2 sets each; Patient exhibits mild guarding with joint mobs and reports increased back discomfort with joint mobs. Continued hypomobility noted with no change in joint mobility;  PT attempted sacral distraction 15 sec hold, 10 sec rest x2 reps; patient reports increased back pain with sacral distraction;  PT performed passive quad stretch to RLE 20 sec hold, x3 reps; PT applied 1/2 bolster under right knee, performed grade II-III PA mobs to right femoral joint, proximal x15 sec bouts x3 sets; Patient reports increased stretch with joint mobilization, no change in pain;  PT had patient in supine: PT performed gentle long axis distraction to RLE in supine x20 sec  hold, 10 sec rest, x3 min; Patient reports increased RLE hip discomfort with long axis distraction;   PT performed passive RLE piriformis stretch 20 sec hold, regular and modified x2 sets each; Patient reports increased discomfort during stretch; She reports increased tenderness along right lateral thigh;  PT had patient roll to left sidelying: PT performed soft / deep tissue massage along right lateral thigh, greater trochanter/IT band x10 min utilizing rolling stick and tennis ball to reduce tightness. Following manual therapy had patient stand and walk with SPC.   She reports no change in pain. She reported  patient was a 6/10 at end of session;                        PT Education - 06/08/18 1538    Education Details  HEP reinforced, manual therapy;     Person(s) Educated  Patient    Methods  Explanation    Comprehension  Verbalized understanding       PT Short Term Goals - 06/01/18 1727      PT SHORT TERM GOAL #1   Title  Patient will be independent in home exercise program to improve strength/mobility for better functional independence with ADLs.    Baseline  HEP given    Time  2    Period  Weeks    Status  New    Target Date  06/15/18      PT SHORT TERM GOAL #2   Title  Patient will perform 5 consecutive sit to stands to increase functional strength and mobility     Baseline  8/5: unable to perform 5    Time  2    Period  Weeks    Status  New    Target Date  06/15/18      PT SHORT TERM GOAL #3   Title  Patient will report a worst pain of 7/10 on VAS in    low back/hip to improve tolerance with ADLs and reduced symptoms with activities.     Baseline  8/5: 10/10    Time  2    Period  Weeks    Status  New    Target Date  06/15/18        PT Long Term Goals - 06/01/18 1729      PT LONG TERM GOAL #1   Title  Patient will report a worst pain of 3/10 on VAS in  low back/hip  to improve tolerance with ADLs and reduced symptoms with activities.      Baseline  8/5: 10/10    Time  4    Period  Weeks    Status  New    Target Date  06/29/18      PT LONG TERM GOAL #2   Title  Patient will reduce modified Oswestry score to <20 as to demonstrate minimal disability with ADLs including improved sleeping tolerance, walking/sitting tolerance etc for better mobility with ADLs.     Baseline  8/5: 74%     Time  4    Period  Weeks    Status  New    Target Date  06/29/18      PT LONG TERM GOAL #3   Title  Patient (< 59 years old) will complete five times sit to stand test in < 10 seconds indicating an increased LE strength and improved balance.    Baseline  unable to perform 5     Time  4    Period  Weeks    Status  New    Target Date  06/29/18      PT LONG TERM GOAL #4   Title  Patient will increase BLE gross strength to 4+/5 as to improve functional strength for independent gait, increased standing tolerance and increased ADL ability.    Baseline  8/5: R 2+/5 L 4-/5    Time  4    Period  Weeks    Status  New    Target Date  06/29/18            Plan -  06/08/18 1643    Clinical Impression Statement  PT performed extensive manual therapy to patient's low back and right hip with no significant change in pain. Patient exhibited increased tightness in lumbar spine (hypomobility) with PA mobs with no change due to intolerance of joint mobilization. Patient reported increased pain with sacral distraction; She reports increased tenderness along right lateral hip. PT performed soft tissue massage utilizing rolling stick/tennis ball to increase tissue extensibility. Patient reports continued RLE hip pain at end of session with no change in symptoms. She would benefit from additional skilled PT intervention to further assess back/hip pain to address deficits.     Rehab Potential  Fair    Clinical Impairments Affecting Rehab Potential  (+) age, currently in heart track (-) severity and chronicity of pain.     PT Frequency  2x / week    PT  Duration  4 weeks    PT Treatment/Interventions  ADLs/Self Care Home Management;Aquatic Therapy;Cryotherapy;Electrical Stimulation;Iontophoresis 4mg /ml Dexamethasone;Moist Heat;Traction;Ultrasound;Functional mobility training;Gait training;DME Instruction;Fluidtherapy;Therapeutic activities;Therapeutic exercise;Balance training;Neuromuscular re-education;Manual techniques;Patient/family education;Scar mobilization;Passive range of motion;Dry needling;Taping;Energy conservation    PT Next Visit Plan  pain reduction review HEP,     PT Home Exercise Plan  see sheet    Consulted and Agree with Plan of Care  Patient       Patient will benefit from skilled therapeutic intervention in order to improve the following deficits and impairments:  Abnormal gait, Decreased activity tolerance, Decreased balance, Decreased coordination, Decreased mobility, Decreased range of motion, Difficulty walking, Decreased strength, Hypomobility, Impaired flexibility, Impaired perceived functional ability, Increased muscle spasms, Postural dysfunction, Improper body mechanics, Pain  Visit Diagnosis: Chronic right-sided low back pain, with sciatica presence unspecified  Pain in right hip  Muscle weakness (generalized)     Problem List Patient Active Problem List   Diagnosis Date Noted  . Chronic cough 05/28/2018  . Shortness of breath 03/26/2018  . Essential hypertension 03/26/2018  . Palpitations 03/26/2018  . Chest pressure 12/08/2017  . Unstable angina (HCC) 12/08/2017  . Angina at rest [facility])   . Hyperlipidemia LDL goal <70   . S/P CABG (coronary artery bypass graft) 11/12/2017  . Non-insulin dependent type 2 diabetes mellitus (HCC) 11/10/2017  . 3-vessel CAD 11/10/2017  . HCVD (hypertensive cardiovascular disease) 11/10/2017  . UTI (urinary tract infection) 11/10/2017  . Pulmonary nodule, right 11/10/2017  . Dyslipidemia 11/07/2017  . Tobacco abuse   . NSTEMI (non-ST elevated myocardial infarction)  (HCC) 11/05/2017    Rishik Tubby PT, DPT 06/08/2018, 5:37 PM  [facility] MAIN [facility] SERVICES [address] [place], [place], [address] Phone: [phone]   Fax:  [phone]  Name: Caitlyn Herrera MRN: [id number] Date of Birth: [birth year]

## 2018-06-09 ENCOUNTER — Ambulatory Visit
Admission: RE | Admit: 2018-06-09 | Discharge: 2018-06-09 | Disposition: A | Discharge status: Home / Self Care | Payer: Medicaid Other | Source: Ambulatory Visit | Attending: Physician Assistant | Admitting: Physician Assistant

## 2018-06-09 ENCOUNTER — Encounter: Payer: Self-pay | Admitting: *Deleted

## 2018-06-09 DIAGNOSIS — I214 Non-ST elevation (NSTEMI) myocardial infarction: Secondary | ICD-10-CM

## 2018-06-09 DIAGNOSIS — N6311 Unspecified lump in the right breast, upper outer quadrant: Secondary | ICD-10-CM | POA: Insufficient documentation

## 2018-06-09 DIAGNOSIS — Z951 Presence of aortocoronary bypass graft: Secondary | ICD-10-CM

## 2018-06-09 NOTE — Progress Notes (Signed)
Cardiac Individual Treatment Plan  Patient Details  Name: Caitlyn Herrera MRN: [id number] Date of Birth: 07/08/1961 Referring Provider:     Cardiac Rehab from 02/05/2018 in [facility] Cardiac and Pulmonary Rehab  Referring Provider  Jane/End      Initial Encounter Date:    Cardiac Rehab from 02/05/2018 in [facility] Cardiac and Pulmonary Rehab  Date  02/05/18      Visit Diagnosis: S/P CABG x 3  NSTEMI (non-ST elevated myocardial infarction) ([place])  Patient's Home Medications on Admission:  Current Outpatient Medications:  .  albuterol (PROVENTIL HFA;VENTOLIN HFA) 108 (90 Base) MCG/ACT inhaler, Inhale 2 puffs into the lungs every 6 (six) hours as needed for wheezing or shortness of breath., Disp: 8 g, Rfl: 2 .  aspirin EC 81 MG tablet, Take 1 tablet (81 mg total) by mouth daily., Disp: 90 tablet, Rfl: 3 .  atorvastatin (LIPITOR) 80 MG tablet, Take 1 tablet (80 mg total) by mouth daily at 6 PM., Disp: 30 tablet, Rfl: 3 .  carvedilol (COREG) 6.25 MG tablet, Take 1 tablet (6.25 mg total) by mouth 2 (two) times daily., Disp: 180 tablet, Rfl: 3 .  citalopram (CELEXA) 20 MG tablet, Take 10 mg by mouth daily., Disp: , Rfl:  .  clopidogrel (PLAVIX) 75 MG tablet, Take 1 tablet (75 mg total) by mouth daily., Disp: 90 tablet, Rfl: 3 .  fluticasone furoate-vilanterol (BREO ELLIPTA) 100-25 MCG/INH AEPB, Inhale 1 puff into the lungs daily., Disp: , Rfl:  .  gabapentin (NEURONTIN) 300 MG capsule, Take 1 capsule (300 mg total) by mouth 2 (two) times daily., Disp: 60 capsule, Rfl: 3 .  lidocaine (XYLOCAINE) 5 % ointment, Apply 1 application topically daily as needed., Disp: , Rfl:  .  losartan (COZAAR) 25 MG tablet, Take 1 tablet (25 mg total) by mouth daily., Disp: 90 tablet, Rfl: 3 .  metFORMIN (GLUCOPHAGE) 500 MG tablet, Take 1 tablet (500 mg total) by mouth 2 (two) times daily with a meal. Resume in 24 hrs., Disp: 60 tablet, Rfl: 1 .  mometasone-formoterol (DULERA) 200-5 MCG/ACT AERO, Inhale 2 puffs into  the lungs 2 (two) times daily., Disp: , Rfl:  .  nicotine (NICODERM CQ - DOSED IN MG/24 HOURS) 21 mg/24hr patch, Place 21 mg onto the skin as directed., Disp: , Rfl:  .  ondansetron (ZOFRAN) 4 MG tablet, Take 1 tablet (4 mg total) by mouth every 8 (eight) hours as needed for nausea or vomiting., Disp: 20 tablet, Rfl: 0 .  pantoprazole (PROTONIX) 40 MG tablet, Take 1 tablet (40 mg total) by mouth daily., Disp: 30 tablet, Rfl: 3  Past Medical History: Past Medical History:  Diagnosis Date  . Abdominal aortic ectasia ([place])    a. 11/2017 CTA chest/abd/pelvis: 2.5 cm abd ao ectasia -->rec f/u u/s in 5 yrs.  [name] CAD (coronary artery disease)    a. 11/04/17 Cath: Native multivessel dzs-->CABG x 3 (LIMA->LAD, VG->OM2, VG->RPDA; b. 11/2017 MV: mid antlat/apical isch; c. 11/2017 Cath: LM 30d, LAD 60m LCX 80p/m, OM1 90, OM2 80 (@ anastamosis of graft), RCA 80p, 1061m90d, LIMA->LAD nl, VG->OM2 nl, VG->RPDA nl-->Med Rx.  . Carotid arterial disease ([facility]   a. 10/2017 Carotid U/S: 1-30% bilat ICA stenosis.  . Marland Kitchenistory of echocardiogram    a. 11/04/2017 Echo: EF of 65-70%, no RWMA, nl LV diastolic fxn, nl RV size/fxn, mild TR.  . Marland Kitchenyperlipidemia   . Hypertension   . Left renal artery stenosis ([facility])    a. 11/2017 CTA Chest/Abd/Pelvis: 50-70% L  RA stenosis.  . Pulmonary nodule, right    a. 10/2017 CT Chest: 73m pulm nodule in R lung apex - rec f/u w/ non-contrast chest CT in 1 year.  . Pure hypercholesterolemia 11/07/2017  . Tobacco abuse   . [place] II diabetes mellitus [place]    a. 10/2017 A1c 6.6.    Tobacco Use: Social History   Tobacco Use  Smoking Status Former Smoker  . Packs/day: 1.00  . Years: 42.00  . Pack years: 42.00  . Types: Cigarettes  . Last attempt to quit: 11/01/2017  . Years since quitting: 0.6  Smokeless Tobacco Never Used  Tobacco Comment   11/05/2017 "stopped smoking 11/01/2017"    Labs: Recent Review Flowsheet Data    Labs for ITP Cardiac and Pulmonary Rehab Latest Ref Rng & Units  11/13/2017 11/13/2017 11/13/2017 12/08/2017 12/17/2017   Cholestrol 100 - 199 mg/dL - - - - 103   LDLCALC 0 - 99 mg/dL - - - - 56   HDL >39 mg/dL - - - - 31(L)   Trlycerides 0 - 149 mg/dL - - - - 79   Hemoglobin A1c 4.8 - 5.6 % - - - - -   PHART 7.350 - 7.450 7.303(L) 7.343(L) - - -   PCO2ART 32.0 - 48.0 mmHg 47.0 43.2 - - -   HCO3 20.0 - 28.0 mmol/L 23.0 23.2 - - -   TCO2 22 - 32 mmol/L _0 -   ACIDBASEDEF 0.0 - 2.0 mmol/L 3.0(H) 2.0 - - -   O2SAT % 99.0 97.0 - - -       Exercise Target Goals: Exercise Program Goal: Individual exercise prescription set using results from initial 6 min walk test and THRR while considering  patient's activity barriers and safety.   Exercise Prescription Goal: Initial exercise prescription builds to 30-45 minutes a day of aerobic activity, 2-3 days per week.  Home exercise guidelines will be given to patient during program as part of exercise prescription that the participant will acknowledge.  Activity Barriers & Risk Stratification:   6 Minute Walk:   Oxygen Initial Assessment:   Oxygen Re-Evaluation:   Oxygen Discharge (Final Oxygen Re-Evaluation):   Initial Exercise Prescription:   Perform Capillary Blood Glucose checks as needed.  Exercise Prescription Changes: Exercise Prescription Changes    Row Name 04/24/18 0900 05/21/18 1000           Response to Exercise   Blood Pressure (Admit)  142/70  130/65      Blood Pressure (Exercise)  -  142/72      Blood Pressure (Exit)  140/84  124/78      Heart Rate (Admit)  53 bpm  71 bpm      Heart Rate (Exercise)  89 bpm  71 bpm      Heart Rate (Exit)  67 bpm  60 bpm      Rating of Perceived Exertion (Exercise)  13  11      Symptoms  none  none      Duration  Continue with 45 min of aerobic exercise without signs/symptoms of physical distress.  Continue with 45 min of aerobic exercise without signs/symptoms of physical distress.      Intensity  THRR unchanged  THRR unchanged          Progression   Progression  Continue to progress workloads to maintain intensity without signs/symptoms of physical distress.  Continue to progress workloads to maintain intensity without signs/symptoms of physical distress.  Average METs  2  2.5        Resistance Training   Training Prescription  Yes  Yes      Weight  3 lb  3 lb      Reps  10-15  10-15        Interval Training   Interval Training  No  No        Treadmill   MPH  1.5  1.5      Grade  0  0      Minutes  15  15      METs  2.15  2.15        Recumbant Bike   Level  -  1      RPM  -  60      Watts  -  20      Minutes  -  15      METs  -  2.73        Biostep-RELP   Level  2  -      Minutes  15  -      METs  2  -        Home Exercise Plan   Plans to continue exercise at  Home (comment) considering FF or fitness center - walk at park or mall  -      Frequency  Add 2 additional days to program exercise sessions.  -      Initial Home Exercises Provided  03/11/18  -         Exercise Comments:   Exercise Goals and Review:   Exercise Goals Re-Evaluation : Exercise Goals Re-Evaluation    [place] Name 04/24/18 0950 05/21/18 1015           Exercise Goal Re-Evaluation   Exercise Goals Review  Increase Physical Activity;Able to understand and use rate of perceived exertion (RPE) scale;Increase Strength and Stamina  Increase Physical Activity;Able to understand and use rate of perceived exertion (RPE) scale;Increase Strength and Stamina;Able to check pulse independently;Knowledge and understanding of Target Heart Rate Range (THRR);Understanding of Exercise Prescription      Comments  Pt would progress more with regular attendance.  Grete has not attended consistently but is walking on her own.        Expected Outcomes  Short - attend class 2-3 times per week Long - increase MET level  Short - finish HT program Long - maintain exercise on her own         Discharge Exercise Prescription (Final Exercise  Prescription Changes): Exercise Prescription Changes - 05/21/18 1000      Response to Exercise   Blood Pressure (Admit)  130/65    Blood Pressure (Exercise)  142/72    Blood Pressure (Exit)  124/78    Heart Rate (Admit)  71 bpm    Heart Rate (Exercise)  71 bpm    Heart Rate (Exit)  60 bpm    Rating of Perceived Exertion (Exercise)  11    Symptoms  none    Duration  Continue with 45 min of aerobic exercise without signs/symptoms of physical distress.    Intensity  THRR unchanged      Progression   Progression  Continue to progress workloads to maintain intensity without signs/symptoms of physical distress.    Average METs  2.5      Resistance Training   Training Prescription  Yes    Weight  3 lb    Reps  10-15  Interval Training   Interval Training  No      Treadmill   MPH  1.5    Grade  0    Minutes  15    METs  2.15      Recumbant Bike   Level  1    RPM  60    Watts  20    Minutes  15    METs  2.73       Nutrition:  Target Goals: Understanding of nutrition guidelines, daily intake of sodium <1551m, cholesterol <2080m calories 30% from fat and 7% or less from saturated fats, daily to have 5 or more servings of fruits and vegetables.  Biometrics:    Nutrition Therapy Plan and Nutrition Goals:   Nutrition Assessments:   Nutrition Goals Re-Evaluation: Nutrition Goals Re-Evaluation    Row Name 04/29/18 1707             Goals   Nutrition Goal  JoTrendas sticking to her low sugar diet. She has been drinking less soda. She tries to limit herself to one fried meat a week and bake the rest, but the last couple weeks have had more. She reported that she needs to get back on her weight loss plan, she has been struggling mentally which has made it hard.        Expected Outcome  Short: JoCireill cut back on snacking. Long: JoAreejill maintain her heart healthy diet independently.           Nutrition Goals Discharge (Final Nutrition Goals  Re-Evaluation): Nutrition Goals Re-Evaluation - 04/29/18 1707      Goals   Nutrition Goal  JoJadzias sticking to her low sugar diet. She has been drinking less soda. She tries to limit herself to one fried meat a week and bake the rest, but the last couple weeks have had more. She reported that she needs to get back on her weight loss plan, she has been struggling mentally which has made it hard.     Expected Outcome  Short: JoCynithiaill cut back on snacking. Long: JoHildeill maintain her heart healthy diet independently.        Psychosocial: Target Goals: Acknowledge presence or absence of significant depression and/or stress, maximize coping skills, provide positive support system. Participant is able to verbalize types and ability to use techniques and skills needed for reducing stress and depression.   Initial Review & Psychosocial Screening:   Quality of Life Scores:   Scores of 19 and below usually indicate a poorer quality of life in these areas.  A difference of  2-3 points is a clinically meaningful difference.  A difference of 2-3 points in the total score of the Quality of Life Index has been associated with significant improvement in overall quality of life, self-image, physical symptoms, and general health in studies assessing change in quality of life.  PHQ-9: Recent Review Flowsheet Data    [facility]   Decreased Interest 1   Down, Depressed, Hopeless 3   PHQ - 2 Score 4   Altered sleeping 3   Tired, decreased energy 2   Change in appetite 2   Feeling bad or failure about yourself  2   Trouble concentrating 2   Moving slowly or fidgety/restless 1   Suicidal thoughts 1    PHQ-9 Score 17   Difficult doing work/chores Very difficult     Interpretation of Total Score  Total Score Depression Severity:  1-4 =  Minimal depression, 5-9 = Mild depression, 10-14 = Moderate depression, 15-19 = Moderately severe depression, 20-27 = Severe depression    Psychosocial Evaluation and Intervention:   Psychosocial Re-Evaluation: Psychosocial Re-Evaluation    [place] Name 04/13/18 1722 04/27/18 1701 04/29/18 1634 05/20/18 1733 06/01/18 1653     Psychosocial Re-Evaluation   Current issues with  Current Stress Concerns;Current Sleep Concerns;Current Psychotropic Meds;History of Depression;Current Depression  Current Depression;Current Sleep Concerns;History of Depression;Current Stress Concerns  -  Current Stress Concerns  Current Anxiety/Panic;Current Depression   Comments  Counselor follow up with Caycee today reporting having workn her heart monitor for a week and had an ultrasound on her heart today.  She also has a disability evaluation this Thursday at a psychiatric and counseling center and wanted to discuss this with this counselor.  She reports continuing to take her medications but has ongoing suicidal thoughts several days a week.  She has been referred to a psychiatrist by her PCP to address this more thoroughly.  Counselor assessed her current mental health status with Paityn denying SI currently and states she calls her daughter or gets outside when she is feeling this way.  Counselor encouraged her to go to the ER if thoughts persist and her coping strategies are not working.  She agreed.  Pristine continues to have some sleep issues but states it's a "little better" and she has to "make" herself come to this class but enjoys it when she does.  Counselor will continue to follow with her and the outcome of all her medical/disability evaluations.    Counselor follow up with Di Kindle today reporting she saw a psychiatrist last week for a disability physical and was encouraged to see a counselor at [facility] in the near future.  She reports feeling "a little better" and is continuingt to stay busy when she has any Suicidal thoughts - with no plan.  She reports having a new heart monitor that is causing her additional stress with it not staying on  very well.  Counselor will continue to follow with Annagrace's mood and treatment plan as she is scheduled to see providers.  Counselor checked in with Elinda today with her reporting having some better days recently and planning to stay in over the 4th of July holiday weekend to avoid the heat.  She reports her mood has improved and she enjoys this program and coming to class.  Counselor commended her on her progress and her positive self care.    Joannas current stress is having a breast lump checked next week.  She says exercise here and walking help her cope with stress.  Counselor follow up with Enez today reporting she is struggling with the recent hip problem and a lump detected in her breast.  she also is scheduled to complete this program on Thursday of this week.  Counselor assessed Astin's mood - as she was tearful today discussing this.  Her daughter is a huge support for her currently.  She continues to think about self-harm at times, but denies any plan to follow through and states she has a mental health appointment at [facility] this Thursday for an intake with a therapist.  Counselor encouraged Vina to stay focused on one day at a time with the lump and not knowing until she has a mammogram.  She agreed to try to be mindful and focus on her hip recovery and her heart recovery.  She will also rely on her daughter and color and sit on  the porch for managing her stress - in addition to exercise.     Expected Outcomes  Short:  Consuella will go to the Emergency Room if the Suicidal thoughts return and her coping strategies are not working.  She will be evalulated later this week by a Mental Health provider.  She will follow through on the referral for a psychiatrist.  Long:  Brantleigh will comply with medications and medical recommendations to treat her depression as well as her heart recovery.    Short:  Nalaysia will keep her appointments to see a counselor and hopefully a psychiatrist in the near  future.  She will go to the ED if her mood worsens in the interim.  Long:  Kyarah will continue to exercise and practice positive coping skills for her health and mental health.  Short:  Saba will continue to practice positive self care.  She will attend all mental health appointments scheduled, and will go to the ED if her mood worsens.  Long:  Sherida will develop positive coping strategies; including exercise for her health and mental health.  Short - attend cardiac rehab to help with stress Long - manage stress on her own with exercise and techniques learned in class  Short:  Grettel will practice positive self-care techniques discussed and attend the intake with the new therapist this Thursday.  She will also try to be mindful vs. worrying as she waits on more information on the lump.  She will continue to exercise for stress reduction.  Long:  Tyrone will consistently exercise and follow with a counselor and Dr. for her overall health and mental health.   Interventions  Stress management education  Stress management education  Stress management education  Encouraged to attend Cardiac Rehabilitation for the exercise  Stress management education   Continue Psychosocial Services   Follow up required by counselor  Follow up required by counselor  -  Follow up required by staff  -      Psychosocial Discharge (Final Psychosocial Re-Evaluation): Psychosocial Re-Evaluation - 06/01/18 1653      Psychosocial Re-Evaluation   Current issues with  Current Anxiety/Panic;Current Depression    Comments  Counselor follow up with Di Kindle today reporting she is struggling with the recent hip problem and a lump detected in her breast.  she also is scheduled to complete this program on Thursday of this week.  Counselor assessed Briellah's mood - as she was tearful today discussing this.  Her daughter is a huge support for her currently.  She continues to think about self-harm at times, but denies any plan to follow through  and states she has a mental health appointment at [facility] this Thursday for an intake with a therapist.  Counselor encouraged Zhanae to stay focused on one day at a time with the lump and not knowing until she has a mammogram.  She agreed to try to be mindful and focus on her hip recovery and her heart recovery.  She will also rely on her daughter and color and sit on the porch for managing her stress - in addition to exercise.      Expected Outcomes  Short:  Janene will practice positive self-care techniques discussed and attend the intake with the new therapist this Thursday.  She will also try to be mindful vs. worrying as she waits on more information on the lump.  She will continue to exercise for stress reduction.  Long:  Laiklyn will consistently exercise and follow with a counselor  and Dr. for her overall health and mental health.    Interventions  Stress management education       Vocational Rehabilitation: Provide vocational rehab assistance to qualifying candidates.   Vocational Rehab Evaluation & Intervention:   Education: Education Goals: Education classes will be provided on a variety of topics geared toward better understanding of heart health and risk factor modification. Participant will state understanding/return demonstration of topics presented as noted by education test scores.  Learning Barriers/Preferences:   Education Topics:  AED/CPR: - Group verbal and written instruction with the use of models to demonstrate the basic use of the AED with the basic ABC's of resuscitation.   Cardiac Rehab from 06/01/2018 in [facility] Cardiac and Pulmonary Rehab  Date  03/18/18  Educator  CE  Instruction Review Code  1- Verbalizes Understanding      General Nutrition Guidelines/Fats and Fiber: -Group instruction provided by verbal, written material, models and posters to present the general guidelines for heart healthy nutrition. Gives an explanation and review of dietary fats  and fiber.   Cardiac Rehab from 06/01/2018 in [facility] Cardiac and Pulmonary Rehab  Date  03/16/18  Educator  CR  Instruction Review Code  1- Verbalizes Understanding      Controlling Sodium/Reading Food Labels: -Group verbal and written material supporting the discussion of sodium use in heart healthy nutrition. Review and explanation with models, verbal and written materials for utilization of the food label.   Exercise Physiology & General Exercise Guidelines: - Group verbal and written instruction with models to review the exercise physiology of the cardiovascular system and associated critical values. Provides general exercise guidelines with specific guidelines to those with heart or lung disease.    Aerobic Exercise & Resistance Training: - Gives group verbal and written instruction on the various components of exercise. Focuses on aerobic and resistive training programs and the benefits of this training and how to safely progress through these programs..   Cardiac Rehab from 06/01/2018 in [facility] Cardiac and Pulmonary Rehab  Date  06/01/18  Educator  [facility]  Instruction Review Code  5- Refused Teaching      Flexibility, Balance, Mind/Body Relaxation: Provides group verbal/written instruction on the benefits of flexibility and balance training, including mind/body exercise modes such as yoga, pilates and tai chi.  Demonstration and skill practice provided.   Stress and Anxiety: - Provides group verbal and written instruction about the health risks of elevated stress and causes of high stress.  Discuss the correlation between heart/lung disease and anxiety and treatment options. Review healthy ways to manage with stress and anxiety.   Depression: - Provides group verbal and written instruction on the correlation between heart/lung disease and depressed mood, treatment options, and the stigmas associated with seeking treatment.   Cardiac Rehab from 06/01/2018 in [facility] Cardiac and Pulmonary  Rehab  Date  02/11/18  Educator  [facility]  Instruction Review Code  1- Verbalizes Understanding      Anatomy & Physiology of the Heart: - Group verbal and written instruction and models provide basic cardiac anatomy and physiology, with the coronary electrical and arterial systems. Review of Valvular disease and Heart Failure   Cardiac Procedures: - Group verbal and written instruction to review commonly prescribed medications for heart disease. Reviews the medication, class of the drug, and side effects. Includes the steps to properly store meds and maintain the prescription regimen. (beta blockers and nitrates)   Cardiac Rehab from 06/01/2018 in [facility] Cardiac and Pulmonary Rehab  Date  04/27/18  Educator  CE  Instruction Review Code  5- Refused Teaching      Cardiac Medications I: - Group verbal and written instruction to review commonly prescribed medications for heart disease. Reviews the medication, class of the drug, and side effects. Includes the steps to properly store meds and maintain the prescription regimen.   Cardiac Medications II: -Group verbal and written instruction to review commonly prescribed medications for heart disease. Reviews the medication, class of the drug, and side effects. (all other drug classes)   Cardiac Rehab from 06/01/2018 in [facility] Cardiac and Pulmonary Rehab  Date  04/01/18  Educator  SB  Instruction Review Code  1- Verbalizes Understanding       Go Sex-Intimacy & Heart Disease, Get SMART - Goal Setting: - Group verbal and written instruction through game format to discuss heart disease and the return to sexual intimacy. Provides group verbal and written material to discuss and apply goal setting through the application of the S.M.A.R.T. Method.   Cardiac Rehab from 06/01/2018 in [facility] Cardiac and Pulmonary Rehab  Date  04/27/18  Educator  CE  Instruction Review Code  5- Refused Teaching      Other Matters of the Heart: - Provides group verbal,  written materials and models to describe Stable Angina and Peripheral Artery. Includes description of the disease process and treatment options available to the cardiac patient.   Exercise & Equipment Safety: - Individual verbal instruction and demonstration of equipment use and safety with use of the equipment.   Cardiac Rehab from 06/01/2018 in [facility] Cardiac and Pulmonary Rehab  Date  02/05/18  Educator  [facility]  Instruction Review Code  1- Verbalizes Understanding      Infection Prevention: - Provides verbal and written material to individual with discussion of infection control including proper hand washing and proper equipment cleaning during exercise session.   Cardiac Rehab from 06/01/2018 in [facility] Cardiac [facility]  Date  02/05/18  Educator  [facility]  Instruction Review Code  1- Verbalizes Understanding      Falls Prevention: - Provides verbal and written material to individual with discussion of falls prevention and safety.   Cardiac Rehab from 06/01/2018 in [facility] Cardiac [facility] Rehab  Date  02/05/18  Educator  [facility]  Instruction Review Code  1- Verbalizes Understanding      Diabetes: - Individual verbal and written instruction to review signs/symptoms of diabetes, desired ranges of glucose level fasting, after meals and with exercise. Acknowledge that pre and post exercise glucose checks will be done for 3 sessions at entry of program.   Know Your Numbers and Risk Factors: -Group verbal and written instruction about important numbers in your health.  Discussion of what are risk factors and how they play a role in the disease process.  Review of Cholesterol, Blood Pressure, Diabetes, and BMI and the role they play in your overall health.   Cardiac Rehab from 06/01/2018 in [facility] Cardiac and Pulmonary Rehab  Date  04/01/18  Educator  [facility]  Instruction Review Code  1- Verbalizes Understanding      Sleep Hygiene: -Provides group verbal and written instruction about how sleep can  affect your health.  Define sleep hygiene, discuss sleep cycles and impact of sleep habits. Review good sleep hygiene tips.    Cardiac Rehab from 06/01/2018 in [facility] Cardiac and Pulmonary Re[facility]  Date  05/20/18  Educator  [facility]  Instruction Review Code  1- Verbalizes Understanding      Other: -Provides group and  verbal instruction on various topics (see comments)   Knowledge Questionnaire Score:   Core Components/Risk Factors/Patient Goals at Admission:   Core Components/Risk Factors/Patient Goals Review:  Goals and Risk Factor Review    Row Name 04/29/18 1702 05/20/18 1730           Core Components/Risk Factors/Patient Goals Review   Personal Goals Review  Weight Management/Obesity;Diabetes;Hypertension  Weight Management/Obesity;Diabetes;Hypertension      Review  Lore has gained a little weight, but is trying to get back on track. Her sugars are running consistent. Her hip is still bothering her, she goes for an xray next week. They are thinking its RA. She is wearing a heart monitor for 14 days. She has noticed some increase in SOB, but her doctor has given her an inhaler that has helped some.    Gracianna saw her Dr and has a referral to Pt for her hip.  She has been measuring BP and BG at home and all have been good.  She is taking all meds as directed.  [place]      Expected Outcomes  Short: Litisha will attend [facility] consistently . Long: Makenley will reach goal weight and graduate [facility]  Short - she will get her PT scheduled to start soon Long - continue exercise for heart health and follow PT instructions for hip         Core Components/Risk Factors/Patient Goals at Discharge (Final Review):  Goals and Risk Factor Review - 05/20/18 1730      Core Components/Risk Factors/Patient Goals Review   Personal Goals Review  Weight Management/Obesity;Diabetes;Hypertension    Review  Laelani saw her Dr and has a referral to Pt for her hip.  She has  been measuring BP and BG at home and all have been good.  She is taking all meds as directed.  [place] [employer] [place]    Expected Outcomes  Short - she will get her PT scheduled to start soon Long - continue exercise for heart health and follow PT instructions for hip       ITP Comments: ITP Comments    Row Name 04/15/18 0621 05/13/18 0611         ITP Comments  30 day review. Continue with ITP unless directed changes per Medical Director review  2 June visits  30 day review. Continue with ITP unless directed changes per Medical Director review.   Last visit 7/3         Comments: discharge

## 2018-06-11 ENCOUNTER — Other Ambulatory Visit: Payer: Self-pay | Admitting: Physician Assistant

## 2018-06-11 DIAGNOSIS — R928 Other abnormal and inconclusive findings on diagnostic imaging of breast: Secondary | ICD-10-CM

## 2018-06-15 ENCOUNTER — Ambulatory Visit: Payer: Medicaid Other | Admitting: Physical Therapy

## 2018-06-22 ENCOUNTER — Ambulatory Visit: Payer: Medicaid Other

## 2018-06-22 DIAGNOSIS — M25551 Pain in right hip: Secondary | ICD-10-CM

## 2018-06-22 DIAGNOSIS — M6281 Muscle weakness (generalized): Secondary | ICD-10-CM

## 2018-06-22 DIAGNOSIS — G8929 Other chronic pain: Secondary | ICD-10-CM

## 2018-06-22 DIAGNOSIS — M545 Low back pain: Principal | ICD-10-CM

## 2018-06-22 NOTE — Therapy (Signed)
[facility] [address] [place], [place], [address] Phone: [phone]   Fax:  [phone]  Physical Therapy Treatment  Patient Details  Name: BRITLEY GASHI MRN: [id number] Date of Birth: [birth year] No data recorded  Encounter Date: 06/22/2018  PT End of Session - 06/23/18 0749    Visit Number  3    Number of Visits  8    Authorization Type  2/3 re-assess at 3    Authorization Time Period  Medicaid approved 8/12-9/11, 3 visits    Authorization - Visit Number  2    Authorization - Number of Visits  3    PT Start Time  7169    PT Stop Time  1556    PT Time Calculation (min)  42 min    Equipment Utilized During Treatment  Gait belt    Activity Tolerance  Patient limited by pain    Behavior During Therapy  [facility] for tasks assessed/performed;Anxious       Past Medical History:  Diagnosis Date  . Abdominal aortic ectasia ([place])    a. 11/2017 CTA chest/abd/pelvis: 2.5 cm abd ao ectasia -->rec f/u u/s in 5 yrs.  [name] CAD (coronary artery disease)    a. 11/04/17 Cath: Native multivessel dzs-->CABG x 3 (LIMA->LAD, VG->OM2, VG->RPDA; b. 11/2017 MV: mid antlat/apical isch; c. 11/2017 Cath: LM 30d, LAD 12m LCX 80p/m, OM1 90, OM2 80 (@ anastamosis of graft), RCA 80p, 1073m90d, LIMA->LAD nl, VG->OM2 nl, VG->RPDA nl-->Med Rx.  . Carotid arterial disease ([place]   a. 10/2017 Carotid U/S: 1-30% bilat ICA stenosis.  . Marland Kitchenistory of echocardiogram    a. 11/04/2017 Echo: EF of 65-70%, no RWMA, nl LV diastolic fxn, nl RV size/fxn, mild TR.  . Marland Kitchenyperlipidemia   . Hypertension   . Left renal artery stenosis ([facility])    a. 11/2017 CTA Chest/Abd/Pelvis: 50-70% L RA stenosis.  . Pulmonary nodule, right    a. 10/2017 CT Chest: 60m56mulm nodule in R lung apex - rec f/u w/ non-contrast chest CT in 1 year.  . Pure hypercholesterolemia 11/07/2017  . Tobacco abuse   . Type II diabetes mellitus ([place]  a. 10/2017 A1c 6.6.    Past Surgical History:  Procedure  Laterality Date  . BREAST CYST EXCISION Right   . CARDIAC CATHETERIZATION    . CORONARY ARTERY BYPASS GRAFT N/A 11/12/2017   Procedure: CORONARY ARTERY BYPASS GRAFTING (CABG) x Three , using left internal mammary artery and right leg greater saphenous vein harvested endoscopically;  Surgeon: VanIvin PootD;  [facility];  Laterality: N/A;  . DILATION AND CURETTAGE OF UTERUS    . LAPAROSCOPIC CHOLECYSTECTOMY    . LEFT HEART CATH AND CORONARY ANGIOGRAPHY N/A 11/04/2017   Procedure: LEFT HEART CATH AND CORONARY ANGIOGRAPHY;  Surgeon: EndNelva BushD;  [facility];  Service: Cardiovascular;  Laterality: N/A;  . LEFT HEART CATH AND CORS/GRAFTS ANGIOGRAPHY N/A 12/09/2017   Procedure: LEFT HEART CATH AND CORS/GRAFTS ANGIOGRAPHY;  Surgeon: JorMartiniqueeter M, MD;  Location: [facility] [place];  Service: Cardiovascular;  Laterality: N/A;  . TEE WITHOUT CARDIOVERSION N/A 11/12/2017   Procedure: TRANSESOPHAGEAL ECHOCARDIOGRAM (TEE);  Surgeon: VanPrescott GumetCollier SalinaD;  [facility];  Laterality: N/A;    There were no vitals filed for this visit.  Subjective Assessment - 06/22/18 1522    Subjective  Patient reports that worst pain  levels are now 7/10 and improve after doing her HEP. Walking continues to make back paand hip pain the worst.     Pertinent History  Patient presents with low back pain and right hip pain. Pain has began about 3 years ago and pain has worsened since January. Went to doctor last week and had injections and pain medication. Patient reports it lasted for a few days. Not given any exercises to do for it. Pain worsens with walking and laying on R hip. Pain relieved by standing. Going to cardiac rehab. PMH includes history of CAD status post CABG in the setting of NSTEMI in 04/622 complicated by recurrent chest pain with repeat catheterization demonstrating severe native CAD and patent grafts,  hypertension, hyperlipidemia, and type 2 diabetes mellitus,     Limitations  Sitting;Lifting;Standing;Walking;House hold activities;Other (comment)    How long can you sit comfortably?  15    How long can you stand comfortably?  30    How long can you walk comfortably?  10 minutes    Diagnostic tests  Minimal degenerative spurring at bilateral hip acetabular margins and enthesopathic spurring at the anterior lateral iliac crests.  Features suggest L4-5 degenerative disc disease.     Patient Stated Goals  to reduce pain     Currently in Pain?  Yes    Pain Score  7     Pain Location  Back    Pain Orientation  Right    Pain Descriptors / Indicators  Aching    Pain Type  Chronic pain    Pain Onset  More than a month ago    Pain Frequency  Intermittent        HEP has been helping with pain a little. R hip continues to be painful when walking  5x STS: 23 seconds  VAS: 7/10 R hip worse, 7/10 back; when walking BLE: R 3+/5 L 4/5 gross MODI : 54%    PT performed grade II-III PA mobs to lumbar spine L1, L2, L3, L4, L5, S1 10 sec bouts x2 sets each; Patient exhibits mild guarding with joint mobs and reports increased back discomfort with joint mobs. Continued hypomobility noted with no change in joint mobility;   PT performed passive quad stretch to RLE 20 sec hold, x3 reps;    PT had patient in supine: PT performed gentle long axis distraction to RLE in supine x20 sec hold painful so attempted short arc distraction which was less painful x 20 seconds x inferior and lateral;   Prone:  Soft tissue massage with elements of effleurage and ptrissage to right lumbar paraspinals to allow for increased mobility and decreased muscle guarding. x6 minutes   Piriformis and right lumbar paraspinal rollout with stick x 5 minutes  Supine:  LE rotation x 2 minutes               PT Education - 06/23/18 0749    Education Details  HEP reinforced, manual therapy, goal progression     Person(s) Educated  Patient    Methods  Explanation;Demonstration;Verbal cues    Comprehension  Verbalized understanding;Returned demonstration       PT Short Term Goals - 06/22/18 1521      PT SHORT TERM GOAL #1   Title  Patient will be independent in home exercise program to improve strength/mobility for better functional independence with ADLs.    Baseline  HEP compliant    Time  2    Period  Weeks    Status  Achieved      PT SHORT TERM GOAL #2   Title  Patient will perform 5 consecutive sit to stands to increase functional strength and mobility     Baseline  8/5: unable to perform 5; 8/26: able to perform 5 with hands on knees    Time  2    Period  Weeks    Status  Achieved      PT SHORT TERM GOAL #3   Title  Patient will report a worst pain of 7/10 on VAS in    low back/hip to improve tolerance with ADLs and reduced symptoms with activities.     Baseline  8/5: 10/10 8/26: 7/10     Time  2    Period  Weeks    Status  Achieved        PT Long Term Goals - 06/22/18 1517      PT LONG TERM GOAL #1   Title  Patient will report a worst pain of 3/10 on VAS in  low back/hip  to improve tolerance with ADLs and reduced symptoms with activities.     Baseline  8/5: 10/10 8/26: 7/10     Time  4    Period  Weeks    Status  Partially Met    Target Date  06/29/18      PT LONG TERM GOAL #2   Title  Patient will reduce modified Oswestry score to <20 as to demonstrate minimal disability with ADLs including improved sleeping tolerance, walking/sitting tolerance etc for better mobility with ADLs.     Baseline  8/5: 74%  8/27 : 54%    Time  4    Period  Weeks    Status  Partially Met    Target Date  06/29/18      PT LONG TERM GOAL #3   Title  Patient (< 67 years old) will complete five times sit to stand test in < 10 seconds indicating an increased LE strength and improved balance.    Baseline  unable to perform 5 ; 8/26: 23 seconds hands on knees    Time  4    Period  Weeks     Status  Partially Met      PT LONG TERM GOAL #4   Title  Patient will increase BLE gross strength to 4+/5 as to improve functional strength for independent gait, increased standing tolerance and increased ADL ability.    Baseline  8/5: R 2+/5 L 4-/5 8/26: R 3+/5 L 4/5     Time  4    Period  Weeks    Status  Partially Met            Plan - 06/23/18 0819    Clinical Impression Statement  Patient demonstrates progression of goals with decreased max VAS since initiation of therapy, improved strength of LE's, and improved mobility demonstrating ability to perform 5x STS for first time. Patient continues to muscle guard resulting in increased pain upon palpation and movement, however is now educated on need for relaxation to reduce negative pain cycle. Patient will continue to benefit from skilled physical therapy to reduce pain, improve mobility, and increase quality of life.     Rehab Potential  Fair    Clinical Impairments Affecting Rehab Potential  (+) age, currently in heart track (-) severity and chronicity of pain.     PT Frequency  2x / week    PT Duration  4 weeks    PT Treatment/Interventions  ADLs/Self Care Home Management;Aquatic Therapy;Cryotherapy;Electrical Stimulation;Iontophoresis 48m/ml Dexamethasone;Moist Heat;Traction;Ultrasound;Functional mobility training;Gait training;DME Instruction;Fluidtherapy;Therapeutic activities;Therapeutic exercise;Balance training;Neuromuscular re-education;Manual techniques;Patient/family education;Scar mobilization;Passive range of motion;Dry needling;Taping;Energy conservation    PT Next Visit Plan  pain reduction review HEP,     PT Home Exercise Plan  see sheet    Consulted and Agree with Plan of Care  Patient       Patient will benefit from skilled therapeutic intervention in order to improve the following deficits and impairments:  Abnormal gait, Decreased activity tolerance, Decreased balance, Decreased coordination, Decreased mobility,  Decreased range of motion, Difficulty walking, Decreased strength, Hypomobility, Impaired flexibility, Impaired perceived functional ability, Increased muscle spasms, Postural dysfunction, Improper body mechanics, Pain  Visit Diagnosis: Chronic right-sided low back pain, with sciatica presence unspecified  Pain in right hip  Muscle weakness (generalized)     Problem List Patient Active Problem List   Diagnosis Date Noted  . Chronic cough 05/28/2018  . Shortness of breath 03/26/2018  . Essential hypertension 03/26/2018  . Palpitations 03/26/2018  . Chest pressure 12/08/2017  . Unstable angina ([place] 12/08/2017  . Angina at rest ([facility]   . Hyperlipidemia LDL goal <70   . S/P CABG (coronary artery bypass graft) 11/12/2017  . Non-insulin dependent type 2 diabetes mellitus ([place] 11/10/2017  . 3-vessel CAD 11/10/2017  . HCVD (hypertensive cardiovascular disease) 11/10/2017  . UTI (urinary tract infection) 11/10/2017  . Pulmonary nodule, right 11/10/2017  . Dyslipidemia 11/07/2017  . Tobacco abuse   . NSTEMI (non-ST elevated myocardial infarction) ([place] 11/05/2017   MJanna Arch PT, DPT   06/23/2018, 8:21 AM  [facility] [address] [address]Phone: [phone]  Fax:  [phone] Name: JLORI POPOWSKIMRN: [id number] of Birth: 107-15-1962

## 2018-06-23 ENCOUNTER — Ambulatory Visit
Admission: RE | Admit: 2018-06-23 | Discharge: 2018-06-23 | Disposition: A | Discharge status: Home / Self Care | Payer: Medicaid Other | Source: Ambulatory Visit | Attending: Physician Assistant | Admitting: Physician Assistant

## 2018-06-23 DIAGNOSIS — R928 Other abnormal and inconclusive findings on diagnostic imaging of breast: Secondary | ICD-10-CM | POA: Diagnosis present

## 2018-06-23 HISTORY — PX: BREAST BIOPSY: SHX20

## 2018-06-24 ENCOUNTER — Ambulatory Visit: Payer: Medicaid Other | Admitting: Cardiothoracic Surgery

## 2018-06-24 LAB — SURGICAL PATHOLOGY

## 2018-06-24 NOTE — Telephone Encounter (Signed)
    2  Caitlyn DanceJoanna Herrera. [name] Female, 57 y.o., [birth year] MRN:  [id number] Phone:  [phone] (H) PCP:  [facility] Coverage:  MEDICAID [place]/MEDICAID [place] Next Appt With Rehabilitation 07/01/2018 at 3:15 PM To Close This Visit   Required Items  No additional encounter notes found.    My Open Encounters   You  Caitlyn Herrera, Caitlyn Herrera  2 months ago    Counselor called to check on [name] since not attending program this week. Plans to be back next Monday 7/1. Multiple Dr. visits including new heart monitor yesterday. Has appointment with psychiatrist this Friday. Pt reports mood has remained the same   Outgoing call

## 2018-07-01 ENCOUNTER — Ambulatory Visit: Payer: Medicaid Other | Admitting: Physical Therapy

## 2018-07-03 ENCOUNTER — Encounter: Payer: Self-pay | Admitting: Nurse Practitioner

## 2018-07-03 ENCOUNTER — Ambulatory Visit (INDEPENDENT_AMBULATORY_CARE_PROVIDER_SITE_OTHER): Payer: Medicaid Other | Admitting: Nurse Practitioner

## 2018-07-03 VITALS — BP 152/80 | HR 54 | Ht 66.0 in | Wt 188.5 lb

## 2018-07-03 DIAGNOSIS — R05 Cough: Secondary | ICD-10-CM | POA: Diagnosis not present

## 2018-07-03 DIAGNOSIS — I2510 Atherosclerotic heart disease of native coronary artery without angina pectoris: Secondary | ICD-10-CM | POA: Diagnosis not present

## 2018-07-03 DIAGNOSIS — I10 Essential (primary) hypertension: Secondary | ICD-10-CM

## 2018-07-03 DIAGNOSIS — E785 Hyperlipidemia, unspecified: Secondary | ICD-10-CM

## 2018-07-03 DIAGNOSIS — R053 Chronic cough: Secondary | ICD-10-CM

## 2018-07-03 DIAGNOSIS — E119 Type 2 diabetes mellitus without complications: Secondary | ICD-10-CM

## 2018-07-03 MED ORDER — LOSARTAN POTASSIUM 50 MG PO TABS: 50.0000 mg | ORAL_TABLET | Freq: Every day | ORAL | 3 refills | Status: DC

## 2018-07-03 NOTE — Progress Notes (Signed)
Office Visit    Patient Name: Caitlyn Herrera Date of Encounter: 07/03/2018  Primary Care Provider:  [name] Primary Cardiologist:  Yvonne Kendall, MD  Chief Complaint    57 year old female with a history of tobacco abuse, hypertension, hyperlipidemia, and type 2 diabetes mellitus, who follows up today related to her history of non-STEMI and CAD status post CABG x3.  Past Medical History    Past Medical History:  Diagnosis Date  . Abdominal aortic ectasia (HCC)    a. 11/2017 CTA chest/abd/pelvis: 2.5 cm abd ao ectasia -->rec f/u u/s in 5 yrs.  [name] CAD (coronary artery disease)    a. 11/04/17 Cath: Native multivessel dzs-->CABG x 3 (LIMA->LAD, VG->OM2, VG->RPDA; b. 11/2017 MV: mid antlat/apical isch; c. 11/2017 Cath: LM 30d, LAD 6m, LCX 80p/m, OM1 90, OM2 80 (@ anastamosis of graft), RCA 80p, 14m, 90d, LIMA->LAD nl, VG->OM2 nl, VG->RPDA nl-->Med Rx.  . Carotid arterial disease (HCC)    a. 10/2017 Carotid U/S: 1-30% bilat ICA stenosis.  [name] History of echocardiogram    a. 11/04/2017 Echo: EF of 65-70%, no RWMA, nl LV diastolic fxn, nl RV size/fxn, mild TR.  [name] Hyperlipidemia   . Hypertension   . Left renal artery stenosis (HCC)    a. 11/2017 CTA Chest/Abd/Pelvis: 50-70% L RA stenosis.  . Pulmonary nodule, right    a. 10/2017 CT Chest: 2mm pulm nodule in R lung apex - rec f/u w/ non-contrast chest CT in 1 year.  . Pure hypercholesterolemia 11/07/2017  . Tobacco abuse   . Type II diabetes mellitus (HCC)    a. 10/2017 A1c 6.6.   Past Surgical History:  Procedure Laterality Date  . BREAST BIOPSY Right 06/23/2018   US guided biopsy - awaiting pathology  . BREAST CYST EXCISION Right   . CARDIAC CATHETERIZATION    . CORONARY ARTERY BYPASS GRAFT N/A 11/12/2017   Procedure: CORONARY ARTERY BYPASS GRAFTING (CABG) x Three , using left internal mammary artery and right leg greater saphenous vein harvested endoscopically;  Surgeon: Kerin Perna, MD;  Location: [facility]  OR;  Service: Open Heart Surgery;  Laterality: N/A;  . DILATION AND CURETTAGE OF UTERUS    . LAPAROSCOPIC CHOLECYSTECTOMY    . LEFT HEART CATH AND CORONARY ANGIOGRAPHY N/A 11/04/2017   Procedure: LEFT HEART CATH AND CORONARY ANGIOGRAPHY;  Surgeon: Yvonne Kendall, MD;  Location: [facility] INVASIVE CV LAB;  Service: Cardiovascular;  Laterality: N/A;  . LEFT HEART CATH AND CORS/GRAFTS ANGIOGRAPHY N/A 12/09/2017   Procedure: LEFT HEART CATH AND CORS/GRAFTS ANGIOGRAPHY;  Surgeon: [name], Peter M, MD;  Location: [facility] INVASIVE CV LAB;  Service: Cardiovascular;  Laterality: N/A;  . TEE WITHOUT CARDIOVERSION N/A 11/12/2017   Procedure: TRANSESOPHAGEAL ECHOCARDIOGRAM (TEE);  Surgeon: Donata Clay, Theron Arista, MD;  Location: [facility] OR;  Service: Open Heart Surgery;  Laterality: N/A;    Allergies  No Known Allergies  History of Present Illness    57 year old female with the above complex past medical history including hypertension, hyperlipidemia, and tobacco use.  January 2019, she suffered a non-STEMI and underwent diagnostic catheterization revealing severe multivessel CAD as outlined above.  Echocardiogram showed normal LV function.  She subsequently underwent CABG x3 with a LIMA to the LAD, vein graft to the OM 2, and vein graft to the RPDA.  She required repeat catheterization in February 2019 in the setting of subsequent admission with mild troponin elevation and abnormal stress testing.  Catheterization showed 3 of 3 patent grafts with native multivessel disease  including 80% stenosis at the anastomosis of the vein graft to the second diagonal.  This area was not felt to be amenable to PCI and she has since been medically managed.  She has been seen on multiple occasions since her bypass surgery and has had complaints of palpitations with event monitors in May and again in July revealing no significant arrhythmias.  On both occasions, duration of wearing the monitor was limited.  She also notes that since her bypass  surgery, she has had chest congestion and a productive cough.  CT angiography of her chest in February showed a small to moderate left pleural effusion.  Subsequent chest x-ray on March 2019 showed no active cardiopulmonary disease.  She says she did have a trial of antibiotics which did not change her cough.  She has not been having any fevers or chills.  In June, she underwent echocardiogram which showed normal LV function.  When she was last seen in July she continued to report cough and also some dyspnea on exertion.  She had been placed on PPI therapy by her primary care prior to that visit.  She does not think that that has helped significantly.  She was hypertensive and metoprolol was switched to carvedilol and losartan was added.  From a cardiac standpoint, she has not had any angina but notes occasional sharp and shooting pain and also numbness along her sternum, both of which have been present since her bypass.  She has not had any significant dyspnea and denies PND, orthopnea, dizziness, syncope, edema, or early satiety.  She remains compliant with medications and remains off of cigarettes.  She says she has a primary care follow-up next week at which time she is apparently planned to have a chest x-ray and also a referral to GI.  Home Medications    Prior to Admission medications   Medication Sig Start Date End Date Taking? Authorizing Provider  albuterol (PROVENTIL HFA;VENTOLIN HFA) 108 (90 Base) MCG/ACT inhaler Inhale 2 puffs into the lungs every 6 (six) hours as needed for wheezing or shortness of breath. 03/12/18  Yes Kerin Perna, MD  aspirin EC 81 MG tablet Take 1 tablet (81 mg total) by mouth daily. 03/26/18  Yes End, Cristal Deer, MD  atorvastatin (LIPITOR) 80 MG tablet Take 1 tablet (80 mg total) by mouth daily at 6 PM. 12/17/17  Yes Creig Hines, NP  carvedilol (COREG) 6.25 MG tablet Take 1 tablet (6.25 mg total) by mouth 2 (two) times daily. 05/27/18  Yes End,  Cristal Deer, MD  citalopram (CELEXA) 20 MG tablet Take 10 mg by mouth daily.   Yes [provider]  clopidogrel (PLAVIX) 75 MG tablet Take 1 tablet (75 mg total) by mouth daily. 03/26/18  Yes End, Cristal Deer, MD  fluticasone furoate-vilanterol (BREO ELLIPTA) 100-25 MCG/INH AEPB Inhale 1 puff into the lungs daily.   Yes [provider]  gabapentin (NEURONTIN) 300 MG capsule Take 1 capsule (300 mg total) by mouth 2 (two) times daily. 03/12/18  Yes Kerin Perna, MD  lidocaine (XYLOCAINE) 5 % ointment Apply 1 application topically daily as needed.   Yes [provider]  metFORMIN (GLUCOPHAGE) 500 MG tablet Take 1 tablet (500 mg total) by mouth 2 (two) times daily with a meal. Resume in 24 hrs. 12/10/17  Yes Duke, Roe Rutherford, PA  mometasone-formoterol (DULERA) 200-5 MCG/ACT AERO Inhale 2 puffs into the lungs 2 (two) times daily.   Yes [provider]  nicotine (NICODERM CQ - DOSED IN  MG/24 HOURS) 21 mg/24hr patch Place 21 mg onto the skin as directed.   Yes [provider]  ondansetron (ZOFRAN) 4 MG tablet Take 1 tablet (4 mg total) by mouth every 8 (eight) hours as needed for nausea or vomiting. 11/28/17  Yes Kerin Perna, MD  pantoprazole (PROTONIX) 40 MG tablet Take 1 tablet (40 mg total) by mouth daily. 12/11/17  Yes Duke, Roe Rutherford, PA  losartan (COZAAR) 25 MG tablet Take 1 tablet (25 mg total) by mouth daily. 03/26/18 06/24/18  End, Cristal Deer, MD    Review of Systems    Persistent mildly productive cough since CABG earlier this year.  She has occasional sharp and shooting chest pain and also notes some numbness over her sternal incision.  She denies dyspnea, palpitations, PND, orthopnea, dizziness, syncope, edema, or early satiety.  All other systems reviewed and are otherwise negative except as noted above.  Physical Exam    VS:  BP (!) 152/80 (BP Location: Left Arm, Patient Position: Sitting, Cuff Size: Normal)   Pulse (!) 54   Ht 5\' 6"   (1.676 m)   Wt 188 lb 8 oz (85.5 kg)   BMI 30.42 kg/m  , BMI Body mass index is 30.42 kg/m. GEN: Well nourished, well developed, in no acute distress. HEENT: normal. Neck: Supple, no JVD, carotid bruits, or masses. Cardiac: RRR, no murmurs, rubs, or gallops. No clubbing, cyanosis, edema.  Radials/DP/PT 2+ and equal bilaterally.  Respiratory:  Respirations regular and unlabored, clear to auscultation bilaterally. GI: Soft, nontender, nondistended, BS + x 4. MS: no deformity or atrophy. Skin: warm and dry, no rash. Neuro:  Strength and sensation are intact. Psych: Normal affect.  Accessory Clinical Findings    ECG personally reviewed by me today -sinus bradycardia, 54, left axis deviation, septal infarct, inferolateral T wave inversion- no acute changes.  Assessment & Plan    1.  Coronary artery disease: Status post non-STEMI and CABG x3 in January 2019 with subsequent catheterization in February 2019 in the setting of mild troponin elevation abnormal stress test showing patent grafts with disease in the obtuse marginal at the anastomosis.  This area was not felt to be amenable to PCI.  She has been medically managed since then.  She occasionally notes sharp shooting/fleeting chest discomfort but has not had any angina or persistent heaviness.  Though she has had dyspnea on exertion over the past few months, she has not experienced that recently.  She remains on aspirin, statin, beta-blocker, Plavix, and ARB therapy.  2.  Essential hypertension: Blood pressure elevated today at 152/80.  Losartan therapy was added at her last visit on July 31 and I am going to titrate this to 50 mg today.  Of note, her cough preceded initiation of ARB and has not worsened since initiation.  Continue current dose of carvedilol with inability to titrate secondary to sinus bradycardia.  She does have a cuff at home and I have asked her to check her blood pressure daily for the next week.  She is to call us if  pressures are consistently greater than 130, at which time we can look to go up to 100 mg daily on losartan.  3.  Hyperlipidemia: LDL was 56 in February with normal LFTs in March.  Continue high potency statin therapy.  4.  Persistent cough: This is been present since bypass.  She had a CT of her chest in February that showed a small left pleural effusion.  Follow-up chest x-ray in March  showed no acute pulmonary disease.  Echocardiogram in June showed normal LV function.  Lungs are clear on examination today and she is euvolemic.  She has been on PPI therapy and has not noticed any significant improvement.  She is not on an ACE inhibitor and cough preceded initiation of ARB.  She does feel that her inhalers improve her cough.  She says she is no longer smoking.  I offered to repeat a chest x-ray however she says she has follow-up with primary care next week and is her understanding that she is scheduled for chest x-ray at that time.  I suggested she could consider using over-the-counter Mucinex to break up secretions and assist with clearing.  5.  History of tobacco abuse: She says she is no longer smoking.  I congratulated her on this.  6.  Type 2 diabetes mellitus: A1c was 6.8 in January.  She remains on metformin is followed by primary care.  7.  Disposition: She will check her blood pressure daily and contact us within the next 2 weeks.  Otherwise follow-up in clinic in 3 months or sooner if necessary.  Nicolasa Ducking, NP 07/03/2018, 12:21 PM

## 2018-07-03 NOTE — Patient Instructions (Signed)
Medication Instructions:  Your physician has recommended you make the following change in your medication:  1- INCREASE Losartan to 50 mg by mouth once a day.   Labwork: none  Testing/Procedures: none  Follow-Up: Your physician recommends that you schedule a follow-up appointment in: 3 MONTHS WITH DR END.    If you need a refill on your cardiac medications before your next appointment, please call your pharmacy.

## 2018-07-06 ENCOUNTER — Ambulatory Visit: Payer: Medicaid Other | Admitting: Physical Therapy

## 2018-07-13 ENCOUNTER — Telehealth: Payer: Self-pay | Admitting: Internal Medicine

## 2018-07-13 ENCOUNTER — Ambulatory Visit: Payer: Medicaid Other | Admitting: Physical Therapy

## 2018-07-13 NOTE — Telephone Encounter (Signed)
Recieved request from :[employer]   Scanned to [employer] Forwarded original  to [employer] for processing

## 2018-07-14 ENCOUNTER — Ambulatory Visit: Payer: Medicaid Other | Admitting: Physical Therapy

## 2018-07-15 ENCOUNTER — Encounter: Payer: Self-pay | Admitting: Surgical

## 2018-07-15 ENCOUNTER — Ambulatory Visit (INDEPENDENT_AMBULATORY_CARE_PROVIDER_SITE_OTHER): Payer: Medicaid Other | Admitting: Surgical

## 2018-07-15 ENCOUNTER — Other Ambulatory Visit: Payer: Self-pay

## 2018-07-15 VITALS — BP 168/88 | HR 81 | Resp 18 | Ht 66.0 in | Wt 193.8 lb

## 2018-07-15 DIAGNOSIS — Z951 Presence of aortocoronary bypass graft: Secondary | ICD-10-CM

## 2018-07-15 NOTE — Progress Notes (Signed)
[address]       [place] [address]             [phone]      Caitlyn Herrera [facility] Medical Record #[id number] Date of Birth: Sep 26, 1961  Referring: Yvonne Kendall, MD Primary Care: [name] Primary Cardiologist: Yvonne Kendall, MD   Chief Complaint:   POST OP FOLLOW UP DATE OF PROCEDURE:  11/12/2017 DATE OF DISCHARGE:                              OPERATIVE REPORT   OPERATIONS: 1. Coronary artery bypass grafting x3 (left internal mammary artery to     left anterior descending, saphenous vein graft to obtuse marginal     branch of the circumflex, saphenous vein graft to posterior     descending branch of the right coronary artery). 2. Endoscopic harvest of right leg greater saphenous vein.  PREOPERATIVE DIAGNOSES:  Non-ST-elevation myocardial infarction, severe 3-vessel coronary artery disease, unstable angina.  POSTOPERATIVE DIAGNOSES:  Non-ST-elevation myocardial infarction, severe 3-vessel coronary artery disease, unstable angina.  SURGEON:  Kerin Perna, MD.  ASSISTANT:  Rowe Clack, PA-C. History of Present Illness:   Patient is a 57 year old female status post the above described procedure.  He is seen in the office on today's date and routine follow-up.  She has been having some postoperative neuritic type pain associated with her sternotomy.  It is improved in the tolerable range with mild soreness with Neurontin.  She continues have some issues related to cough as well as acid reflux.  She states that she is scheduled to have further evaluation in this regard.  It sounds like they are planning to do an esophagram.  She does have some shortness of breath with ambulation.  She is a former smoker having quit around the time of surgery and so far is doing very well in regards to smoking cessation.  She does have both Breo and Dulera inhalers.  She also has hypertension and medications were  recently adjusted per cardiology.      Past Medical History:  Diagnosis Date  . Abdominal aortic ectasia (HCC)    a. 11/2017 CTA chest/abd/pelvis: 2.5 cm abd ao ectasia -->rec f/u u/s in 5 yrs.  [name] CAD (coronary artery disease)    a. 11/04/17 Cath: Native multivessel dzs-->CABG x 3 (LIMA->LAD, VG->OM2, VG->RPDA; b. 11/2017 MV: mid antlat/apical isch; c. 11/2017 Cath: LM 30d, LAD 17m, LCX 80p/m, OM1 90, OM2 80 (@ anastamosis of graft), RCA 80p, 126m, 90d, LIMA->LAD nl, VG->OM2 nl, VG->RPDA nl-->Med Rx.  . Carotid arterial disease (HCC)    a. 10/2017 Carotid U/S: 1-30% bilat ICA stenosis.  [name] History of echocardiogram    a. 11/04/2017 Echo: EF of 65-70%, no RWMA, nl LV diastolic fxn, nl RV size/fxn, mild TR; b. 03/2018 Echo: EF 60-65%, mild LVH, no rwma, mild MR. Mildy reduced RV fxn. Mild TR.  [name] History of Tobacco abuse   . Hyperlipidemia   . Hypertension   . Left renal artery stenosis (HCC)    a. 11/2017 CTA Chest/Abd/Pelvis: 50-70% L RA stenosis.  . Palpitations    a. 02/2018 Event Monitor: wore for 2 days, 21 hrs - rare PAC's/PVC's. Avg HR 66 (42-103); b. 04/2018 Event Monitor: Wore for 14 days. RSR, 67 (49-111). No significant arrhythmias.  . Persistent cough   . Pulmonary nodule, right    a.  10/2017 CT Chest: 5mm pulm nodule in R lung apex - rec f/u w/ non-contrast chest CT in 1 year.  . Type II diabetes mellitus (HCC)    a. 10/2017 A1c 6.6.     Social History   Tobacco Use  Smoking Status Former Smoker  . Packs/day: 1.00  . Years: 42.00  . Pack years: 42.00  . Types: Cigarettes  . Last attempt to quit: 11/01/2017  . Years since quitting: 0.7  Smokeless Tobacco Never Used  Tobacco Comment   11/05/2017 "stopped smoking 11/01/2017"    Social History   Substance and Sexual Activity  Alcohol Use No     No Known Allergies  Current Outpatient Medications  Medication Sig Dispense Refill  . albuterol (PROVENTIL HFA;VENTOLIN HFA) 108 (90 Base) MCG/ACT inhaler Inhale 2 puffs into the lungs  every 6 (six) hours as needed for wheezing or shortness of breath. 8 g 2  . aspirin EC 81 MG tablet Take 1 tablet (81 mg total) by mouth daily. 90 tablet 3  . atorvastatin (LIPITOR) 80 MG tablet Take 1 tablet (80 mg total) by mouth daily at 6 PM. 30 tablet 3  . carvedilol (COREG) 6.25 MG tablet Take 1 tablet (6.25 mg total) by mouth 2 (two) times daily. 180 tablet 3  . citalopram (CELEXA) 20 MG tablet Take 10 mg by mouth daily.    . clopidogrel (PLAVIX) 75 MG tablet Take 1 tablet (75 mg total) by mouth daily. 90 tablet 3  . fluticasone furoate-vilanterol (BREO ELLIPTA) 100-25 MCG/INH AEPB Inhale 1 puff into the lungs daily.    [name]. gabapentin (NEURONTIN) 300 MG capsule Take 1 capsule (300 mg total) by mouth 2 (two) times daily. 60 capsule 3  . lidocaine (XYLOCAINE) 5 % ointment Apply 1 application topically daily as needed.    [name]. losartan (COZAAR) 50 MG tablet Take 1 tablet (50 mg total) by mouth daily. 90 tablet 3  . metFORMIN (GLUCOPHAGE) 500 MG tablet Take 1 tablet (500 mg total) by mouth 2 (two) times daily with a meal. Resume in 24 hrs. 60 tablet 1  . mometasone-formoterol (DULERA) 200-5 MCG/ACT AERO Inhale 2 puffs into the lungs 2 (two) times daily.    . nicotine (NICODERM CQ - DOSED IN MG/24 HOURS) 21 mg/24hr patch Place 21 mg onto the skin as directed.    . ondansetron (ZOFRAN) 4 MG tablet Take 1 tablet (4 mg total) by mouth every 8 (eight) hours as needed for nausea or vomiting. 20 tablet 0  . pantoprazole (PROTONIX) 40 MG tablet Take 1 tablet (40 mg total) by mouth daily. 30 tablet 3   No current facility-administered medications for this visit.        Physical Exam: BP (!) 168/88 (BP Location: Right Arm, Patient Position: Sitting, Cuff Size: Normal)   Pulse 81   Resp 18   Ht 5\' 6"  (1.676 m)   Wt 87.9 kg   SpO2 97% Comment: RA  BMI 31.28 kg/m   General appearance: alert, cooperative and no distress Heart: regular rate and rhythm Lungs: clear to auscultation  bilaterally Abdomen: soft, non-tender; bowel sounds normal; no masses,  no organomegaly Extremities: No edema Wound: Incisions well-healed without evidence of infection.   Diagnostic Studies & Laboratory data:     Recent Radiology Findings:   No results found.    Recent Lab Findings: Lab Results  Component Value Date   WBC 7.6 03/26/2018   HGB 11.2 03/26/2018   HCT 34.1 03/26/2018   PLT 454 (H)  03/26/2018   GLUCOSE 91 03/26/2018   CHOL 103 12/17/2017   TRIG 79 12/17/2017   HDL 31 (L) 12/17/2017   LDLCALC 56 12/17/2017   ALT 12 (L) 01/09/2018   AST 19 01/09/2018   NA 141 03/26/2018   K 3.9 03/26/2018   CL 105 03/26/2018   CREATININE 0.98 03/26/2018   BUN 13 03/26/2018   CO2 22 03/26/2018   INR 1.26 12/09/2017   HGBA1C 6.8 (H) 11/11/2017      Assessment / Plan: She is doing well post CABG but does continue have multiple chronic medical issues that are being managed by cardiology and her primary care physician.  The neuropathic pain related to her sternotomy appears to be well controlled on current dose of Neurontin.  She is quite stable from a cardiothoracic surgical perspective.  We will see her again on a as needed basis for any surgically related needs or at request.          Rowe Clack, PA-C 07/15/2018 12:25 PM  Pager [phone]

## 2018-07-15 NOTE — Patient Instructions (Signed)
Follow up as needed

## 2018-07-16 ENCOUNTER — Ambulatory Visit: Payer: Medicaid Other | Admitting: Physical Therapy

## 2018-07-20 ENCOUNTER — Other Ambulatory Visit: Payer: Self-pay | Admitting: Cardiothoracic Surgery

## 2018-07-20 ENCOUNTER — Ambulatory Visit: Payer: Medicaid Other

## 2018-07-21 ENCOUNTER — Ambulatory Visit: Payer: Medicaid Other

## 2018-07-23 ENCOUNTER — Ambulatory Visit: Payer: Medicaid Other | Admitting: Physical Therapy

## 2018-07-24 ENCOUNTER — Ambulatory Visit: Payer: Medicaid Other | Attending: Orthopedic Surgery

## 2018-07-24 DIAGNOSIS — M6281 Muscle weakness (generalized): Secondary | ICD-10-CM | POA: Diagnosis present

## 2018-07-24 DIAGNOSIS — M545 Low back pain: Secondary | ICD-10-CM | POA: Diagnosis present

## 2018-07-24 DIAGNOSIS — G8929 Other chronic pain: Secondary | ICD-10-CM | POA: Insufficient documentation

## 2018-07-24 DIAGNOSIS — M25551 Pain in right hip: Secondary | ICD-10-CM | POA: Diagnosis present

## 2018-07-24 NOTE — Therapy (Signed)
[place] MAIN [facility] SERVICES [address] [place], [place], [address] Phone: [phone]   Fax:  [phone]  Physical Therapy Treatment  Patient Details  Name: Caitlyn Herrera MRN: [id number] Date of Birth: 1961/02/18 No data recorded  Encounter Date: 07/24/2018  Herrera End of Session - 07/24/18 0943    Visit Number  4    Number of Visits  12    Authorization Time Period  Medicaid approved 9/13-11/7 8 visits     Authorization - Visit Number  2    Authorization - Number of Visits  3    Herrera Start Time  0946    Herrera Stop Time  1030    Herrera Time Calculation (min)  44 min    Equipment Utilized During Treatment  Gait belt    Activity Tolerance  Patient limited by pain    Behavior During Therapy  [facility] for tasks assessed/performed;Anxious       Past Medical History:  Diagnosis Date  . Abdominal aortic ectasia ([place])    a. 11/2017 CTA chest/abd/pelvis: 2.5 cm abd ao ectasia -->rec f/u u/s in 5 yrs.  [name] CAD (coronary artery disease)    a. 11/04/17 Cath: Native multivessel dzs-->CABG x 3 (LIMA->LAD, VG->OM2, VG->RPDA; b. 11/2017 MV: mid antlat/apical isch; c. 11/2017 Cath: LM 30d, LAD 67m LCX 80p/m, OM1 90, OM2 80 (@ anastamosis of graft), RCA 80p, 1052m90d, LIMA->LAD nl, VG->OM2 nl, VG->RPDA nl-->Med Rx.  . Carotid arterial disease ([facility]   a. 10/2017 Carotid U/S: 1-30% bilat ICA stenosis.  . Marland Kitchenistory of echocardiogram    a. 11/04/2017 Echo: EF of 65-70%, no RWMA, nl LV diastolic fxn, nl RV size/fxn, mild TR; b. 03/2018 Echo: EF 60-65%, mild LVH, no rwma, mild MR. Mildy reduced RV fxn. Mild TR.  . Marland Kitchenistory of Tobacco abuse   . Hyperlipidemia   . Hypertension   . Left renal artery stenosis (HCC)    a. 11/2017 CTA Chest/Abd/Pelvis: 50-70% L RA stenosis.  . Palpitations    a. 02/2018 Event Monitor: wore for 2 days, 21 hrs - rare PAC's/PVC's. Avg HR 66 (42-103); b. 04/2018 Event Monitor: Wore for 14 days. RSR, 67 (49-111). No significant arrhythmias.  . Persistent  cough   . Pulmonary nodule, right    a. 10/2017 CT Chest: 41m19mulm nodule in R lung apex - rec f/u w/ non-contrast chest CT in 1 year.  . Type II diabetes mellitus ([place]CC[place]  a. 10/2017 A1c 6.6.    Past Surgical History:  Procedure Laterality Date  . BREAST BIOPSY Right 06/23/2018   US [place]ided biopsy - awaiting pathology  . BREAST CYST EXCISION Right   . CARDIAC CATHETERIZATION    . CORONARY ARTERY BYPASS GRAFT N/A 11/12/2017   Procedure: CORONARY ARTERY BYPASS GRAFTING (CABG) x Three , using left internal mammary artery and right leg greater saphenous vein harvested endoscopically;  Surgeon: VanIvin PootD;  [facility] [place];  Laterality: N/A;  . DILATION AND CURETTAGE OF UTERUS    . LAPAROSCOPIC CHOLECYSTECTOMY    . LEFT HEART CATH AND CORONARY ANGIOGRAPHY N/A 11/04/2017   Procedure: LEFT HEART CATH AND CORONARY ANGIOGRAPHY;  Surgeon: EndNelva BushD;  [facility];  Service: Cardiovascular;  Laterality: N/A;  . LEFT HEART CATH AND CORS/GRAFTS ANGIOGRAPHY N/A 12/09/2017   Procedure: LEFT HEART CATH AND CORS/GRAFTS ANGIOGRAPHY;  Surgeon: JorMartiniqueeter M, Caitlyn Herrera;  Location[facility] [place] [facility];  Service: Cardiovascular;  Laterality: N/A;  . TEE WITHOUT CARDIOVERSION N/A 11/12/2017   Procedure: TRANSESOPHAGEAL ECHOCARDIOGRAM (TEE);  Surgeon: Prescott Gum, Collier Salina, Caitlyn Herrera;  Location: [place];  Service: Open Heart Surgery;  Laterality: N/A;    There were no vitals filed for this visit.  Subjective Assessment - 07/24/18 0951    Subjective  Patient was last seen four weeks ago, retesting goals due to long duration of time between visits. Patient is supposed to start forever fit soon. Patient got a cortisone shot in her hip last week and is now on a muscle relaxer and a pain pill. Patient reports compliance with HEP. Patient fell Monday when R knee gave out.     Pertinent History  Patient presents with low back pain and right hip pain. Pain has began about 3  years ago and pain has worsened since January. Went to doctor last week and had injections and pain medication. Patient reports it lasted for a few days. Not given any exercises to do for it. Pain worsens with walking and laying on R hip. Pain relieved by standing. Going to cardiac rehab. PMH includes history of CAD status post CABG in the setting of NSTEMI in 06/5092 complicated by recurrent chest pain with repeat catheterization demonstrating severe native CAD and patent grafts, hypertension, hyperlipidemia, and type 2 diabetes mellitus,     Limitations  Sitting;Lifting;Standing;Walking;House hold activities;Other (comment)    How long can you sit comfortably?  15    How long can you stand comfortably?  30    How long can you walk comfortably?  10 minutes    Diagnostic tests  Minimal degenerative spurring at bilateral hip acetabular margins and enthesopathic spurring at the anterior lateral iliac crests.  Features suggest L4-5 degenerative disc disease.     Patient Stated Goals  to reduce pain     Currently in Pain?  Yes    Pain Score  7     Pain Location  Back    Pain Orientation  Lower    Pain Descriptors / Indicators  Aching;Pressure    Pain Type  Chronic pain    Pain Radiating Towards  radiates to L hip     Pain Onset  More than a month ago    Pain Frequency  Intermittent    Aggravating Factors   walking (feels like it tightens it up)    Pain Relieving Factors  muscle relaxer        5x STS: 26 seconds with excessive UE support   VAS: worst 7/10 ; legs pulsing when lay in bed. When on muscle relaxer pain reduces to 3/10  BLE: R 4-/5 L 4/5 gross MODI : 66%     Prone:  Soft tissue massage with elements of effleurage and ptrissage to right lumbar paraspinals to allow for increased mobility and decreased muscle guarding. x6 minutes   Prone press up 20x plus mobilization to lumbar spine in varying levels    Piriformis and right lumbar paraspinal rollout with stick x 5 minutes  Herrera  performed grade II-III PA mobs to lumbar spine L1, L2, L3, L4, L5, S1 10 sec bouts x4 sets each; Patient exhibits mild guarding with joint mobs and reports increased back discomfort with joint mobs. Continued hypomobility noted with no change in joint mobility;   Herrera performed passive quad stretch to RLE 20 sec hold, x3 reps;   Supine:  LE rotation x 2 minutes  Herrera Education - 07/24/18 [phone]    Education Details  goals, manual therapy, HEP reinforced    Person(s) Educated  Patient    Methods  Explanation;Demonstration;Verbal cues    Comprehension  Verbalized understanding;Returned demonstration       Herrera Short Term Goals - 07/24/18 1000      Herrera SHORT TERM GOAL #1   Title  Patient will be independent in home exercise program to improve strength/mobility for better functional independence with ADLs.    Baseline  HEP compliant    Time  2    Period  Weeks    Status  Achieved      Herrera SHORT TERM GOAL #2   Title  Patient will perform 5 consecutive sit to stands to increase functional strength and mobility     Baseline  8/5: unable to perform 5; 8/26: able to perform 5 with hands on knees    Time  2    Period  Weeks    Status  Achieved      Herrera SHORT TERM GOAL #3   Title  Patient will report a worst pain of 7/10 on VAS in    low back/hip to improve tolerance with ADLs and reduced symptoms with activities.     Baseline  8/5: 10/10 8/26: 7/10     Time  2    Period  Weeks    Status  Achieved        Herrera Long Term Goals - 07/24/18 1000      Herrera LONG TERM GOAL #1   Title  Patient will report a worst pain of 3/10 on VAS in  low back/hip  to improve tolerance with ADLs and reduced symptoms with activities.     Baseline  8/5: 10/10 8/26: 7/10 9/27: 7/10     Time  6    Period  Weeks    Status  Partially Met    Target Date  09/03/18      Herrera LONG TERM GOAL #2   Title  Patient will reduce modified Oswestry score to <20 as to demonstrate minimal  disability with ADLs including improved sleeping tolerance, walking/sitting tolerance etc for better mobility with ADLs.     Baseline  8/5: 74%  8/27 : 54% 9/27: 66%    Time  6    Period  Weeks    Status  Partially Met    Target Date  09/03/18      Herrera LONG TERM GOAL #3   Title  Patient (< 70 years old) will complete five times sit to stand test in < 10 seconds indicating an increased LE strength and improved balance.    Baseline  unable to perform 5 ; 8/26: 23 seconds hands on knees 9/27: 26 seconds excessive UE support     Time  6    Period  Weeks    Status  Partially Met    Target Date  09/03/18      Herrera LONG TERM GOAL #4   Title  Patient will increase BLE gross strength to 4+/5 as to improve functional strength for independent gait, increased standing tolerance and increased ADL ability.    Baseline  8/5: R 2+/5 L 4-/5 8/26: R 3+/5 L 4/5 9/27: R 4-/5 L 4/5     Time  6    Period  Weeks    Status  Partially Met    Target Date  09/03/18            Plan -  07/24/18 0959    Clinical Impression Statement  Due to patient being absent from physical therapy for 1 month patient has had no decrease in worst VAS pain levels despite new medication. Her 5x STS is heavily reliant upon UE use and is very guarded. Parapinal musculature is guarded and patient has pain centric mindset reducing spinal mobility and decreasing coordination and movement. Patient will begin aquatic therapy beginning next week to reduce muscle guarding, improve strength in LE and postural musculature and reduce pain. Patient will continue to benefit from skilled physical therapy to reduce pain, improve mobility, and increase quality of life.     Rehab Potential  Fair    Clinical Impairments Affecting Rehab Potential  (+) age, currently in heart track (-) severity and chronicity of pain.     Herrera Frequency  2x / week    Herrera Duration  6 weeks    Herrera Treatment/Interventions  ADLs/Self Care Home Management;Aquatic  Therapy;Cryotherapy;Electrical Stimulation;Iontophoresis 49m/ml Dexamethasone;Moist Heat;Traction;Ultrasound;Functional mobility training;Gait training;DME Instruction;Fluidtherapy;Therapeutic activities;Therapeutic exercise;Balance training;Neuromuscular re-education;Manual techniques;Patient/family education;Scar mobilization;Passive range of motion;Dry needling;Taping;Energy conservation    Herrera Next Visit Plan  pain reduction review HEP,     Herrera Home Exercise Plan  see sheet    Consulted and Agree with Plan of Care  Patient       Patient will benefit from skilled therapeutic intervention in order to improve the following deficits and impairments:  Abnormal gait, Decreased activity tolerance, Decreased balance, Decreased coordination, Decreased mobility, Decreased range of motion, Difficulty walking, Decreased strength, Hypomobility, Impaired flexibility, Impaired perceived functional ability, Increased muscle spasms, Postural dysfunction, Improper body mechanics, Pain  Visit Diagnosis: Chronic right-sided low back pain, with sciatica presence unspecified  Pain in right hip  Muscle weakness (generalized)     Problem List Patient Active Problem List   Diagnosis Date Noted  . Chronic cough 05/28/2018  . Shortness of breath 03/26/2018  . Essential hypertension 03/26/2018  . Palpitations 03/26/2018  . Chest pressure 12/08/2017  . Unstable angina ([place] 12/08/2017  . Angina at rest ([facility]   . Hyperlipidemia LDL goal <70   . S/P CABG (coronary artery bypass graft) 11/12/2017  . Non-insulin dependent type 2 diabetes mellitus ([place] 11/10/2017  . 3-vessel CAD 11/10/2017  . HCVD (hypertensive cardiovascular disease) 11/10/2017  . UTI (urinary tract infection) 11/10/2017  . Pulmonary nodule, right 11/10/2017  . Dyslipidemia 11/07/2017  . Tobacco abuse   . NSTEMI (non-ST elevated myocardial infarction) ([place] 11/05/2017   MJanna Herrera Herrera, Caitlyn Herrera   07/24/2018, 11:49 AM  [place] [facility] [facility] [address]RHunker [place] [address]Phone: [phone]  Fax:  [phone] Name: JMILENA LIGGETTMRN: [id number] of Birth: [birth year]

## 2018-07-27 ENCOUNTER — Ambulatory Visit: Payer: Medicaid Other

## 2018-07-30 ENCOUNTER — Ambulatory Visit: Payer: Medicaid Other

## 2018-07-30 ENCOUNTER — Ambulatory Visit: Payer: Medicaid Other | Admitting: Physical Therapy

## 2018-08-03 ENCOUNTER — Ambulatory Visit: Payer: Medicaid Other

## 2018-08-04 ENCOUNTER — Ambulatory Visit: Payer: Medicaid Other

## 2018-08-05 ENCOUNTER — Ambulatory Visit: Payer: Medicaid Other

## 2018-08-06 DIAGNOSIS — Z736 Limitation of activities due to disability: Secondary | ICD-10-CM

## 2018-08-10 ENCOUNTER — Ambulatory Visit: Payer: Medicaid Other

## 2018-08-11 ENCOUNTER — Ambulatory Visit: Payer: Medicaid Other

## 2018-08-12 ENCOUNTER — Ambulatory Visit: Payer: Medicaid Other

## 2018-08-13 ENCOUNTER — Ambulatory Visit: Payer: Medicaid Other

## 2018-08-17 ENCOUNTER — Ambulatory Visit: Payer: Medicaid Other | Admitting: Physical Therapy

## 2018-08-17 ENCOUNTER — Ambulatory Visit: Payer: Medicaid Other

## 2018-08-18 ENCOUNTER — Ambulatory Visit: Payer: Medicaid Other | Attending: Orthopedic Surgery

## 2018-08-18 DIAGNOSIS — M6281 Muscle weakness (generalized): Secondary | ICD-10-CM | POA: Insufficient documentation

## 2018-08-18 DIAGNOSIS — M25551 Pain in right hip: Secondary | ICD-10-CM | POA: Insufficient documentation

## 2018-08-25 ENCOUNTER — Other Ambulatory Visit: Payer: Self-pay

## 2018-08-25 ENCOUNTER — Ambulatory Visit: Payer: Medicaid Other

## 2018-08-25 DIAGNOSIS — M25551 Pain in right hip: Secondary | ICD-10-CM | POA: Diagnosis present

## 2018-08-25 DIAGNOSIS — M6281 Muscle weakness (generalized): Secondary | ICD-10-CM

## 2018-08-25 NOTE — Therapy (Signed)
[place] MAIN [facility] SERVICES [address] [place], [place], [address] Phone: [phone]   Fax:  [phone]  Physical Therapy Treatment  Patient Details  Name: Caitlyn Herrera MRN: [id number] Date of Birth: [birth year] No data recorded  Encounter Date: 08/25/2018  PT End of Session - 08/25/18 1457    Visit Number  5    Number of Visits  12    Authorization Type  3/3 re-assess at 3    Authorization Time Period  Medicaid approved 9/13-11/7 8 visits     Authorization - Visit Number  3    Authorization - Number of Visits  3    PT Start Time  0935    PT Stop Time  5852    PT Time Calculation (min)  40 min    Activity Tolerance  Patient tolerated treatment well;Other (comment)    Behavior During Therapy  Anxious       Past Medical History:  Diagnosis Date  . Abdominal aortic ectasia ([place])    a. 11/2017 CTA chest/abd/pelvis: 2.5 cm abd ao ectasia -->rec f/u u/s in 5 yrs.  [name] CAD (coronary artery disease)    a. 11/04/17 Cath: Native multivessel dzs-->CABG x 3 (LIMA->LAD, VG->OM2, VG->RPDA; b. 11/2017 MV: mid antlat/apical isch; c. 11/2017 Cath: LM 30d, LAD 3m LCX 80p/m, OM1 90, OM2 80 (@ anastamosis of graft), RCA 80p, 1062m90d, LIMA->LAD nl, VG->OM2 nl, VG->RPDA nl-->Med Rx.  . Carotid arterial disease ([place]   a. 10/2017 Carotid U/S: 1-30% bilat ICA stenosis.  . Marland Kitchenistory of echocardiogram    a. 11/04/2017 Echo: EF of 65-70%, no RWMA, nl LV diastolic fxn, nl RV size/fxn, mild TR; b. 03/2018 Echo: EF 60-65%, mild LVH, no rwma, mild MR. Mildy reduced RV fxn. Mild TR.  . Marland Kitchenistory of Tobacco abuse   . Hyperlipidemia   . Hypertension   . Left renal artery stenosis (HCC)    a. 11/2017 CTA Chest/Abd/Pelvis: 50-70% L RA stenosis.  . Palpitations    a. 02/2018 Event Monitor: wore for 2 days, 21 hrs - rare PAC's/PVC's. Avg HR 66 (42-103); b. 04/2018 Event Monitor: Wore for 14 days. RSR, 67 (49-111). No significant arrhythmias.  . Persistent cough   .  Pulmonary nodule, right    a. 10/2017 CT Chest: 80m41mulm nodule in R lung apex - rec f/u w/ non-contrast chest CT in 1 year.  . Type II diabetes mellitus ([place]CCNorth [place]  a. 10/2017 A1c 6.6.    Past Surgical History:  Procedure Laterality Date  . BREAST BIOPSY Right 06/23/2018   US [place]ided biopsy - awaiting pathology  . BREAST CYST EXCISION Right   . CARDIAC CATHETERIZATION    . CORONARY ARTERY BYPASS GRAFT N/A 11/12/2017   Procedure: CORONARY ARTERY BYPASS GRAFTING (CABG) x Three , using left internal mammary artery and right leg greater saphenous vein harvested endoscopically;  Surgeon: VanIvin PootD;  [facility] [place];  Laterality: N/A;  . DILATION AND CURETTAGE OF UTERUS    . LAPAROSCOPIC CHOLECYSTECTOMY    . LEFT HEART CATH AND CORONARY ANGIOGRAPHY N/A 11/04/2017   Procedure: LEFT HEART CATH AND CORONARY ANGIOGRAPHY;  Surgeon: EndNelva BushD;  [facility] [facility];  Service: Cardiovascular;  Laterality: N/A;  . LEFT HEART CATH AND CORS/GRAFTS ANGIOGRAPHY N/A 12/09/2017   Procedure: LEFT HEART CATH AND CORS/GRAFTS ANGIOGRAPHY;  Surgeon: JorMartiniqueeter M, MD;  [facility] [place] [facility];  Service: Cardiovascular;  Laterality:  N/A;  . TEE WITHOUT CARDIOVERSION N/A 11/12/2017   Procedure: TRANSESOPHAGEAL ECHOCARDIOGRAM (TEE);  Surgeon: Prescott Gum, Collier Salina, MD;  Location: [place];  Service: Open Heart Surgery;  Laterality: N/A;    There were no vitals filed for this visit.  Subjective Assessment - 08/25/18 1453    Subjective  Pt reports current pain is 8/10 in R LB/hip down to ankle along lateral leg. Pt states this pain is slightly higher than her baseline pain. Pt notes being nervous of being in water.     Pertinent History  Patient presents with low back pain and right hip pain. Pain has began about 3 years ago and pain has worsened since January. Went to doctor last week and had injections and pain medication. Patient reports it lasted for a few days.  Not given any exercises to do for it. Pain worsens with walking and laying on R hip. Pain relieved by standing. Going to cardiac rehab. PMH includes history of CAD status post CABG in the setting of NSTEMI in 04/1061 complicated by recurrent chest pain with repeat catheterization demonstrating severe native CAD and patent grafts, hypertension, hyperlipidemia, and type 2 diabetes mellitus,       Enters/exits via ramp Requires Min guard to assist at all times  Ambulation, warm up (very slow at this time)  4 L fwd  2 L side  Rail  Squats  Hip abd/add (slow)  Hip flex/ext (slow)  Bench  Slow bike, 5 min  Active stretching, R, C, L 3x ea  LB with gentle manual sweep through LB  Education on position of release of piriformis.                         PT Education - 08/25/18 1455    Education Details  Benefits and properties of water as it pertains to exercise. Posture, core stabilization, basic stand and seated exercise. LB stretch. Position of release for piriformis to use at home proactive and reactively    Person(s) Educated  Patient    Methods  Explanation;Demonstration;Tactile cues;Verbal cues    Comprehension  Verbalized understanding;Verbal cues required;Tactile cues required;Need further instruction       PT Short Term Goals - 07/24/18 1000      PT SHORT TERM GOAL #1   Title  Patient will be independent in home exercise program to improve strength/mobility for better functional independence with ADLs.    Baseline  HEP compliant    Time  2    Period  Weeks    Status  Achieved      PT SHORT TERM GOAL #2   Title  Patient will perform 5 consecutive sit to stands to increase functional strength and mobility     Baseline  8/5: unable to perform 5; 8/26: able to perform 5 with hands on knees    Time  2    Period  Weeks    Status  Achieved      PT SHORT TERM GOAL #3   Title  Patient will report a worst pain of 7/10 on VAS in    low back/hip to improve  tolerance with ADLs and reduced symptoms with activities.     Baseline  8/5: 10/10 8/26: 7/10     Time  2    Period  Weeks    Status  Achieved        PT Long Term Goals - 07/24/18 1000      PT LONG TERM GOAL #  1   Title  Patient will report a worst pain of 3/10 on VAS in  low back/hip  to improve tolerance with ADLs and reduced symptoms with activities.     Baseline  8/5: 10/10 8/26: 7/10 9/27: 7/10     Time  6    Period  Weeks    Status  Partially Met    Target Date  09/03/18      PT LONG TERM GOAL #2   Title  Patient will reduce modified Oswestry score to <20 as to demonstrate minimal disability with ADLs including improved sleeping tolerance, walking/sitting tolerance etc for better mobility with ADLs.     Baseline  8/5: 74%  8/27 : 54% 9/27: 66%    Time  6    Period  Weeks    Status  Partially Met    Target Date  09/03/18      PT LONG TERM GOAL #3   Title  Patient (< 50 years old) will complete five times sit to stand test in < 10 seconds indicating an increased LE strength and improved balance.    Baseline  unable to perform 5 ; 8/26: 23 seconds hands on knees 9/27: 26 seconds excessive UE support     Time  6    Period  Weeks    Status  Partially Met    Target Date  09/03/18      PT LONG TERM GOAL #4   Title  Patient will increase BLE gross strength to 4+/5 as to improve functional strength for independent gait, increased standing tolerance and increased ADL ability.    Baseline  8/5: R 2+/5 L 4-/5 8/26: R 3+/5 L 4/5 9/27: R 4-/5 L 4/5     Time  6    Period  Weeks    Status  Partially Met    Target Date  09/03/18            Plan - 08/25/18 1458    Clinical Impression Statement  Pt tolerated treatment, but with mild anxiety. Pt requires Min guard to Min A throughout session to move about pool. Pt requires cues for decreasing grip on floatation device/rails and well as therapist and educated on tense grip exacerbating pain as well as possible elevation of BP. Pt  able to demonstrate loose grip with cues, but requires consistent reminders. Ambulation slow requiring verbal cues for more fluid stride; again difficulty at this time due to anxiety with water, but pt inconsistently able to demonstrate improved technique. Pt educated on position of release for piriformis muscle for use at home. Continue to progress and increase comfort/fluidity in aquatic environment to benefit from supportive evironment for decreased muscle guarding with activity/exercise.     Rehab Potential  Fair    Clinical Impairments Affecting Rehab Potential  (+) age, currently in heart track (-) severity and chronicity of pain.     PT Frequency  2x / week    PT Duration  6 weeks    PT Treatment/Interventions  ADLs/Self Care Home Management;Aquatic Therapy;Cryotherapy;Electrical Stimulation;Iontophoresis 57m/ml Dexamethasone;Moist Heat;Traction;Ultrasound;Functional mobility training;Gait training;DME Instruction;Fluidtherapy;Therapeutic activities;Therapeutic exercise;Balance training;Neuromuscular re-education;Manual techniques;Patient/family education;Scar mobilization;Passive range of motion;Dry needling;Taping;Energy conservation    PT Next Visit Plan  pain reduction review HEP,     PT Home Exercise Plan  see sheet    Consulted and Agree with Plan of Care  Patient       Patient will benefit from skilled therapeutic intervention in order to improve the following deficits and impairments:  Abnormal gait, Decreased activity tolerance, Decreased balance, Decreased coordination, Decreased mobility, Decreased range of motion, Difficulty walking, Decreased strength, Hypomobility, Impaired flexibility, Impaired perceived functional ability, Increased muscle spasms, Postural dysfunction, Improper body mechanics, Pain  Visit Diagnosis: Pain in right hip  Muscle weakness (generalized)     Problem List Patient Active Problem List   Diagnosis Date Noted  . Chronic cough 05/28/2018  .  Shortness of breath 03/26/2018  . Essential hypertension 03/26/2018  . Palpitations 03/26/2018  . Chest pressure 12/08/2017  . Unstable angina ([place]) 12/08/2017  . Angina at rest [facility])   . Hyperlipidemia LDL goal <70   . S/P CABG (coronary artery bypass graft) 11/12/2017  . Non-insulin dependent type 2 diabetes mellitus ([place]) 11/10/2017  . 3-vessel CAD 11/10/2017  . HCVD (hypertensive cardiovascular disease) 11/10/2017  . UTI (urinary tract infection) 11/10/2017  . Pulmonary nodule, right 11/10/2017  . Dyslipidemia 11/07/2017  . Tobacco abuse   . NSTEMI (non-ST elevated myocardial infarction) ([place]) 11/05/2017    Caitlyn Herrera 08/25/2018, 3:04 PM  [place] MAIN [facility] SERVICES [address] [place], [place], [address] Phone: [phone]   Fax:  [phone]  Name: Caitlyn Herrera MRN: [id number] Date of Birth: 10/11/1961

## 2018-08-26 ENCOUNTER — Encounter: Payer: Medicaid Other | Admitting: Cardiothoracic Surgery

## 2018-08-27 ENCOUNTER — Ambulatory Visit: Payer: Medicaid Other

## 2018-09-01 ENCOUNTER — Ambulatory Visit: Payer: Medicaid Other | Attending: Orthopedic Surgery

## 2018-09-01 ENCOUNTER — Ambulatory Visit: Payer: Medicaid Other

## 2018-09-01 DIAGNOSIS — M6281 Muscle weakness (generalized): Secondary | ICD-10-CM

## 2018-09-01 DIAGNOSIS — M25551 Pain in right hip: Secondary | ICD-10-CM

## 2018-09-01 NOTE — Therapy (Signed)
[place] MAIN [facility] SERVICES [address] [place], [place], [address] Phone: [phone]   Fax:  [phone]  Physical Therapy Treatment / Discharge  Patient Details  Name: Caitlyn Herrera MRN: [id number] Date of Birth: 06-09-1961 No data recorded  Encounter Date: 09/01/2018  PT End of Session - 09/01/18 1401    Visit Number  6    Number of Visits  12    Authorization Type  4/3 re-assess at 3    Authorization Time Period  Medicaid approved 9/13-11/7 8 visits     Authorization - Visit Number  4    Authorization - Number of Visits  3    PT Start Time  6962    PT Stop Time  1428    PT Time Calculation (min)  42 min    Activity Tolerance  Patient tolerated treatment well;Other (comment)    Behavior During Therapy  Anxious       Past Medical History:  Diagnosis Date  . Abdominal aortic ectasia ([place])    a. 11/2017 CTA chest/abd/pelvis: 2.5 cm abd ao ectasia -->rec f/u u/s in 5 yrs.  [name] CAD (coronary artery disease)    a. 11/04/17 Cath: Native multivessel dzs-->CABG x 3 (LIMA->LAD, VG->OM2, VG->RPDA; b. 11/2017 MV: mid antlat/apical isch; c. 11/2017 Cath: LM 30d, LAD 34m LCX 80p/m, OM1 90, OM2 80 (@ anastamosis of graft), RCA 80p, 1069m90d, LIMA->LAD nl, VG->OM2 nl, VG->RPDA nl-->Med Rx.  . [place] arterial [place] ([place]   a. 10/2017 Carotid U/S: 1-30% bilat ICA stenosis.  . Marland Kitchenistory of echocardiogram    a. 11/04/2017 Echo: EF of 65-70%, no RWMA, nl LV diastolic fxn, nl RV size/fxn, mild TR; b. 03/2018 Echo: EF 60-65%, mild LVH, no rwma, mild MR. Mildy reduced RV fxn. Mild TR.  . Marland Kitchenistory of Tobacco abuse   . Hyperlipidemia   . Hypertension   . Left renal artery stenosis (HCC)    a. 11/2017 CTA Chest/Abd/Pelvis: 50-70% L RA stenosis.  . Palpitations    a. 02/2018 Event Monitor: wore for 2 days, 21 hrs - rare PAC's/PVC's. Avg HR 66 (42-103); b. 04/2018 Event Monitor: Wore for 14 days. RSR, 67 (49-111). No significant arrhythmias.  . Persistent cough    . Pulmonary nodule, right    a. 10/2017 CT Chest: 38m51mulm nodule in R lung apex - rec f/u w/ non-contrast chest CT in 1 year.  . [place] II diabetes mellitus ([place]  a. 10/2017 A1c 6.6.    Past Surgical History:  Procedure Laterality Date  . BREAST BIOPSY Right 06/23/2018   US [place] biopsy - awaiting pathology  . BREAST CYST EXCISION Right   . CARDIAC CATHETERIZATION    . CORONARY ARTERY BYPASS GRAFT N/A 11/12/2017   Procedure: CORONARY ARTERY BYPASS GRAFTING (CABG) x Three , using left internal mammary artery and right leg greater saphenous vein harvested endoscopically;  Surgeon: VanIvin PootD;  [facility];  Laterality: N/A;  . DILATION AND CURETTAGE OF UTERUS    . LAPAROSCOPIC CHOLECYSTECTOMY    . LEFT HEART CATH AND CORONARY ANGIOGRAPHY N/A 11/04/2017   Procedure: LEFT HEART CATH AND CORONARY ANGIOGRAPHY;  Surgeon: EndNelva BushD;  [facility];  Service: Cardiovascular;  Laterality: N/A;  . LEFT HEART CATH AND CORS/GRAFTS ANGIOGRAPHY N/A 12/09/2017   Procedure: LEFT HEART CATH AND CORS/GRAFTS ANGIOGRAPHY;  Surgeon: JorMartiniqueeter M, MD;  Location: [facility] [place] [facility];  Service: Cardiovascular;  Laterality: N/A;  . TEE WITHOUT CARDIOVERSION N/A 11/12/2017   Procedure: TRANSESOPHAGEAL ECHOCARDIOGRAM (TEE);  Surgeon: Prescott Gum, Collier [place], MD;  Location: [facility];  Service: Open Heart Surgery;  Laterality: N/A;    There were no vitals filed for this visit.  Subjective Assessment - 09/01/18 1354    Subjective  Patient presents with cane and 8/10 pain. Patient has only attended one aquatic therapy session since last land session stating her doctor told her not to go due to her high blood pressure. Has not been compliant with plan of care, reports she has had "someone rub her back" for pain relief.     Pertinent History  Patient presents with low back pain and right hip pain. Pain has began about 3 years ago and pain has worsened since  January. Went to doctor last week and had injections and pain medication. Patient reports it lasted for a few days. Not given any exercises to do for it. Pain worsens with walking and laying on R hip. Pain relieved by standing. Going to cardiac rehab. PMH includes history of CAD status post CABG in the setting of NSTEMI in 0/6237 complicated by recurrent chest pain with repeat catheterization demonstrating severe native CAD and patent grafts, hypertension, hyperlipidemia, and type 2 diabetes mellitus,     Currently in Pain?  Yes    Pain Score  8     Pain Location  Back    Pain Orientation  Lower    Pain Descriptors / Indicators  Aching    Pain Type  Chronic pain    Pain Onset  More than a month ago    Pain Frequency  Intermittent       Vitals: 143/ 79 pulse 70    VAS: 8/10  MODI: 72% 5x STS: 22 seconds BLE: gross 4/5   Treat:  Prone:  Prone press up 20x plus mobilization to lumbar spine in varying levels    Piriformis and right lumbar paraspinal rollout with stick x 5 minutes   PT performed grade II-III PA mobs to lumbar spine L1, L2, L3, L4, L5, S1 10 sec bouts x4 sets each; Patient exhibits mild guarding with joint mobs and reports increased back discomfort with joint mobs. Continued hypomobility noted with no change in joint mobility;    PT performed passive quad stretch to RLE 20 sec hold, x3 reps;   Supine:  LE rotation x 2 minutes  Hamstring stretch 60 seconds    Seated: Swiss ball forward flexion 10x , to angle L and R 10x each direction    6/10 pain by end of session.                     PT Education - 09/01/18 1400    Education Details  d/c due to non attendance    Person(s) Educated  Patient    Methods  Explanation    Comprehension  Verbalized understanding       PT Short Term Goals - 09/01/18 1515      PT SHORT TERM GOAL #1   Title  Patient will be independent in home exercise program to improve strength/mobility for better functional  independence with ADLs.    Baseline  HEP compliant    Time  2    Period  Weeks    Status  Achieved      PT SHORT TERM GOAL #2   Title  Patient will perform 5 consecutive sit to stands to increase functional strength and mobility  Baseline  8/5: unable to perform 5; 8/26: able to perform 5 with hands on knees    Time  2    Period  Weeks    Status  Achieved      PT SHORT TERM GOAL #3   Title  Patient will report a worst pain of 7/10 on VAS in    low back/hip to improve tolerance with ADLs and reduced symptoms with activities.     Baseline  8/5: 10/10 8/26: 7/10     Time  2    Period  Weeks    Status  Achieved        PT Long Term Goals - 09/01/18 1515      PT LONG TERM GOAL #1   Title  Patient will report a worst pain of 3/10 on VAS in  low back/hip  to improve tolerance with ADLs and reduced symptoms with activities.     Baseline  8/5: 10/10 8/26: 7/10 9/27: 7/10 11/5: 8/10     Time  6    Period  Weeks    Status  Not Met      PT LONG TERM GOAL #2   Title  Patient will reduce modified Oswestry score to <20 as to demonstrate minimal disability with ADLs including improved sleeping tolerance, walking/sitting tolerance etc for better mobility with ADLs.     Baseline  8/5: 74%  8/27 : 54% 9/27: 66% 11/5: 72%    Time  6    Period  Weeks    Status  Not Met      PT LONG TERM GOAL #3   Title  Patient (< 40 years old) will complete five times sit to stand test in < 10 seconds indicating an increased LE strength and improved balance.    Baseline  unable to perform 5 ; 8/26: 23 seconds hands on knees 9/27: 26 seconds excessive UE support  11/5: 22 seconds excessive UE support     Time  6    Period  Weeks    Status  Not Met      PT LONG TERM GOAL #4   Title  Patient will increase BLE gross strength to 4+/5 as to improve functional strength for independent gait, increased standing tolerance and increased ADL ability.    Baseline  8/5: R 2+/5 L 4-/5 8/26: R 3+/5 L 4/5 9/27: R 4-/5 L  4/5 11/5: 4/5 gross    Time  6    Period  Weeks    Status  Not Met            Plan - 09/01/18 1623    Clinical Impression Statement  Patient has only attended one aquatic therapy session since last land session. She has only attended 1 session per month limiting her progression as demonstrated In not meeting/making measurable progress with any goals. . Patient reports her doctor told her to not attend aquatic therapy due to her blood pressure. Reports its more stabilized this week. Patient will be discharged due to noncompliance/non attendance. Patient reports understanding. I will be happy to see patient again in the future as needed.     Rehab Potential  Fair    Clinical Impairments Affecting Rehab Potential  (+) age, currently in heart track (-) severity and chronicity of pain.     PT Frequency  2x / week    PT Duration  6 weeks    PT Treatment/Interventions  ADLs/Self Care Home Management;Aquatic Therapy;Cryotherapy;Electrical Stimulation;Iontophoresis '4mg'$ /ml Dexamethasone;Moist Heat;Traction;Ultrasound;Functional mobility training;Gait  training;DME Instruction;Fluidtherapy;Therapeutic activities;Therapeutic exercise;Balance training;Neuromuscular re-education;Manual techniques;Patient/family education;Scar mobilization;Passive range of motion;Dry needling;Taping;Energy conservation    PT Next Visit Plan  pain reduction review HEP,     PT Home Exercise Plan  see sheet    Consulted and Agree with Plan of Care  Patient       Patient will benefit from skilled therapeutic intervention in order to improve the following deficits and impairments:  Abnormal gait, Decreased activity tolerance, Decreased balance, Decreased coordination, Decreased mobility, Decreased range of motion, Difficulty walking, Decreased strength, Hypomobility, Impaired flexibility, Impaired perceived functional ability, Increased muscle spasms, Postural dysfunction, Improper body mechanics, Pain  Visit Diagnosis: Pain  in right hip  Muscle weakness (generalized)     Problem List Patient Active Problem List   Diagnosis Date Noted  . Chronic cough 05/28/2018  . Shortness of breath 03/26/2018  . Essential hypertension 03/26/2018  . Palpitations 03/26/2018  . Chest pressure 12/08/2017  . Unstable angina ([address]) 12/08/2017  . Angina at rest [facility])   . Hyperlipidemia LDL goal <70   . S/P CABG (coronary artery bypass graft) 11/12/2017  . Non-insulin dependent type 2 diabetes mellitus ([place]) 11/10/2017  . 3-vessel CAD 11/10/2017  . HCVD (hypertensive cardiovascular disease) 11/10/2017  . UTI (urinary tract infection) 11/10/2017  . Pulmonary nodule, right 11/10/2017  . Dyslipidemia 11/07/2017  . Tobacco abuse   . NSTEMI (non-ST elevated myocardial infarction) ([place]) 11/05/2017   Janna Arch, PT, DPT   09/01/2018, 4:25 PM  [place] MAIN [facility] SERVICES [address] [place], [place], [address] Phone: [phone]   Fax:  [phone]  Name: KALIA VAHEY MRN: [id number] Date of Birth: 1961-05-08

## 2018-09-02 ENCOUNTER — Ambulatory Visit: Payer: Medicaid Other

## 2018-09-02 ENCOUNTER — Telehealth: Payer: Self-pay | Admitting: Internal Medicine

## 2018-09-02 ENCOUNTER — Other Ambulatory Visit: Payer: Self-pay

## 2018-09-02 DIAGNOSIS — E785 Hyperlipidemia, unspecified: Secondary | ICD-10-CM

## 2018-09-02 MED ORDER — ATORVASTATIN CALCIUM 80 MG PO TABS: 80.0000 mg | ORAL_TABLET | Freq: Every day | ORAL | 3 refills | Status: DC

## 2018-09-02 NOTE — Telephone Encounter (Signed)
lmov to reschedule appt 12/6 with Dr. Okey Dupre .  Will attempt to contact at a later time.

## 2018-09-03 NOTE — Telephone Encounter (Signed)
lmov to reschedule appt 12/6 with Dr. End .  Will attempt to contact at a later time.  °

## 2018-09-07 NOTE — Telephone Encounter (Signed)
Patient has been rescheduled for 11/25/17

## 2018-09-16 ENCOUNTER — Ambulatory Visit: Payer: Medicaid Other | Admitting: Cardiothoracic Surgery

## 2018-09-16 ENCOUNTER — Encounter: Payer: Self-pay | Admitting: Cardiothoracic Surgery

## 2018-09-16 VITALS — BP 140/72 | HR 63 | Resp 20 | Ht 66.0 in | Wt 190.0 lb

## 2018-09-16 DIAGNOSIS — Z951 Presence of aortocoronary bypass graft: Secondary | ICD-10-CM | POA: Diagnosis not present

## 2018-09-16 DIAGNOSIS — I2510 Atherosclerotic heart disease of native coronary artery without angina pectoris: Secondary | ICD-10-CM | POA: Diagnosis not present

## 2018-09-16 NOTE — Progress Notes (Signed)
[facility]P is [name] Referring Provider is End, Cristal Deer, MD  Chief Complaint  Patient presents with  . Routine Post Op    HPI: Patient returns for office visit 9 months after CABG x3 for unstable angina. She complains of neuritic type pain in her left shoulder and left chest.  Neurontin helps this at night.  Patient had small diffusely disease suboptimal targets at surgery.  She underwent postoperative catheterization for atypical pain following discharge showing patent left IMA to a small LAD, patent vein graft to the distal RCA with a severe size mismatch because of the large size of the saphenous vein and the small size of her coronary and a patent vein graft to the circumflex marginal also with a large size mismatch between the saphenous vein and the distal circumflex.  LV function is preserved.  Patient also has complaints of a chronic dry cough and some atypical chest pain that is related to change in position, lying down or sitting up which probably is related to her chest soreness.  Patient completed cardiac rehab at [facility]. She complains also now of right hip pain from osteoarthritis and is received physical therapy as well as injections.  Past Medical History:  Diagnosis Date  . Abdominal aortic ectasia (HCC)    a. 11/2017 CTA chest/abd/pelvis: 2.5 cm abd ao ectasia -->rec f/u u/s in 5 yrs.  [name] CAD (coronary artery disease)    a. 11/04/17 Cath: Native multivessel dzs-->CABG x 3 (LIMA->LAD, VG->OM2, VG->RPDA; b. 11/2017 MV: mid antlat/apical isch; c. 11/2017 Cath: LM 30d, LAD 15m, LCX 80p/m, OM1 90, OM2 80 (@ anastamosis of graft), RCA 80p, 165m, 90d, LIMA->LAD nl, VG->OM2 nl, VG->RPDA nl-->Med Rx.  . Carotid arterial disease (HCC)    a. 10/2017 Carotid U/S: 1-30% bilat ICA stenosis.  [name] History of echocardiogram    a. 11/04/2017 Echo: EF of 65-70%, no RWMA, nl LV diastolic fxn, nl RV size/fxn, mild TR; b. 03/2018 Echo: EF 60-65%, mild LVH, no rwma, mild  MR. Mildy reduced RV fxn. Mild TR.  [name] History of Tobacco abuse   . Hyperlipidemia   . Hypertension   . Left renal artery stenosis (HCC)    a. 11/2017 CTA Chest/Abd/Pelvis: 50-70% L RA stenosis.  . Palpitations    a. 02/2018 Event Monitor: wore for 2 days, 21 hrs - rare PAC's/PVC's. Avg HR 66 (42-103); b. 04/2018 Event Monitor: Wore for 14 days. RSR, 67 (49-111). No significant arrhythmias.  . Persistent cough   . Pulmonary nodule, right    a. 10/2017 CT Chest: 5mm pulm nodule in R lung apex - rec f/u w/ non-contrast chest CT in 1 year.  . Type II diabetes mellitus (HCC)    a. 10/2017 A1c 6.6.    Past Surgical History:  Procedure Laterality Date  . BREAST BIOPSY Right 06/23/2018   US guided biopsy - awaiting pathology  . BREAST CYST EXCISION Right   . CARDIAC CATHETERIZATION    . CORONARY ARTERY BYPASS GRAFT N/A 11/12/2017   Procedure: CORONARY ARTERY BYPASS GRAFTING (CABG) x Three , using left internal mammary artery and right leg greater saphenous vein harvested endoscopically;  Surgeon: Kerin Perna, MD;  Location: [facility] OR;  Service: Open Heart Surgery;  Laterality: N/A;  . DILATION AND CURETTAGE OF UTERUS    . LAPAROSCOPIC CHOLECYSTECTOMY    . LEFT HEART CATH AND CORONARY ANGIOGRAPHY N/A 11/04/2017   Procedure: LEFT HEART CATH AND CORONARY ANGIOGRAPHY;  Surgeon: Yvonne Kendall, MD;  Location: [facility] INVASIVE  CV LAB;  Service: Cardiovascular;  Laterality: N/A;  . LEFT HEART CATH AND CORS/GRAFTS ANGIOGRAPHY N/A 12/09/2017   Procedure: LEFT HEART CATH AND CORS/GRAFTS ANGIOGRAPHY;  Surgeon: [name],  M, MD;  Location: [facility] INVASIVE CV LAB;  Service: Cardiovascular;  Laterality: N/A;  . TEE WITHOUT CARDIOVERSION N/A 11/12/2017   Procedure: TRANSESOPHAGEAL ECHOCARDIOGRAM (TEE);  Surgeon: Donata Clay, Theron Arista, MD;  Location: [facility] OR;  Service: Open Heart Surgery;  Laterality: N/A;    Family History  Problem Relation Age of Onset  . Hypertension Mother   . Heart Problems Mother   . Breast cancer  Neg Hx     Social History Social History   Tobacco Use  . Smoking status: Former Smoker    Packs/day: 1.00    Years: 42.00    Pack years: 42.00    Types: Cigarettes    Last attempt to quit: 11/01/2017    Years since quitting: 0.8  . Smokeless tobacco: Never Used  . Tobacco comment: 11/05/2017 "stopped smoking 11/01/2017"  Substance Use Topics  . Alcohol use: No  . Drug use: No    Current Outpatient Medications  Medication Sig Dispense Refill  . albuterol (PROVENTIL HFA;VENTOLIN HFA) 108 (90 Base) MCG/ACT inhaler Inhale 2 puffs into the lungs every 6 (six) hours as needed for wheezing or shortness of breath. 8 g 2  . aspirin EC 81 MG tablet Take 1 tablet (81 mg total) by mouth daily. 90 tablet 3  . atorvastatin (LIPITOR) 80 MG tablet Take 1 tablet (80 mg total) by mouth daily at 6 PM. 30 tablet 3  . carvedilol (COREG) 6.25 MG tablet Take 1 tablet (6.25 mg total) by mouth 2 (two) times daily. 180 tablet 3  . citalopram (CELEXA) 20 MG tablet Take 10 mg by mouth daily.    . clopidogrel (PLAVIX) 75 MG tablet Take 1 tablet (75 mg total) by mouth daily. 90 tablet 3  . fluticasone furoate-vilanterol (BREO ELLIPTA) 100-25 MCG/INH AEPB Inhale 1 puff into the lungs daily.    [name] gabapentin (NEURONTIN) 300 MG capsule Take 1 capsule (300 mg total) by mouth 2 (two) times daily. 60 capsule 3  . lidocaine (XYLOCAINE) 5 % ointment Apply 1 application topically daily as needed.    [name] losartan (COZAAR) 50 MG tablet Take 1 tablet (50 mg total) by mouth daily. (Patient taking differently: Take 100 mg by mouth daily. ) 90 tablet 3  . metFORMIN (GLUCOPHAGE) 500 MG tablet Take 1 tablet (500 mg total) by mouth 2 (two) times daily with a meal. Resume in 24 hrs. 60 tablet 1  . mometasone-formoterol (DULERA) 200-5 MCG/ACT AERO Inhale 2 puffs into the lungs 2 (two) times daily.    . ondansetron (ZOFRAN) 4 MG tablet Take 1 tablet (4 mg total) by mouth every 8 (eight) hours as needed for nausea or vomiting. 20 tablet 0   . pantoprazole (PROTONIX) 40 MG tablet Take 1 tablet (40 mg total) by mouth daily. 30 tablet 3  . nicotine (NICODERM CQ - DOSED IN MG/24 HOURS) 21 mg/24hr patch Place 21 mg onto the skin as directed.     No current facility-administered medications for this visit.     No Known Allergies  Review of Systems  Multiple planes about different types of pain and cough   BP 140/72   Pulse 63   Resp 20   Ht 5\' 6"  (1.676 m)   Wt 190 lb (86.2 kg)   SpO2 97% Comment: RA  BMI 30.67 kg/m  Physical Exam  Alert and comfortable Lungs clear Sternal incision well-healed and stable, Heart rate rhythm regular without murmur No pedal edema  Diagnostic Tests: No test today  Impression: Patient having neuritic type pain in her chest. Encouraged her to use the Neurontin at night. We discussed heart healthy lifestyle and heart healthy diet is important to her recovery after heart surgery.  Plan: Continue medical therapy.  Patient with very poor distal target vessels and would not be candidate for redo surgery.  Return as needed.   Mikey BussingPeter Van Trigt III, MD [facility] [phone]

## 2018-10-02 ENCOUNTER — Ambulatory Visit: Payer: Medicaid Other | Admitting: Internal Medicine

## 2018-10-05 ENCOUNTER — Encounter: Payer: Self-pay | Admitting: Internal Medicine

## 2018-10-05 ENCOUNTER — Ambulatory Visit: Payer: Medicaid Other | Admitting: Internal Medicine

## 2018-10-05 VITALS — BP 110/72 | HR 67 | Ht 66.0 in | Wt 194.5 lb

## 2018-10-05 DIAGNOSIS — E785 Hyperlipidemia, unspecified: Secondary | ICD-10-CM | POA: Diagnosis not present

## 2018-10-05 DIAGNOSIS — I2510 Atherosclerotic heart disease of native coronary artery without angina pectoris: Secondary | ICD-10-CM | POA: Diagnosis not present

## 2018-10-05 DIAGNOSIS — I10 Essential (primary) hypertension: Secondary | ICD-10-CM

## 2018-10-05 MED ORDER — CLOPIDOGREL BISULFATE 75 MG PO TABS: 75.0000 mg | ORAL_TABLET | Freq: Every day | ORAL | 3 refills | Status: DC

## 2018-10-05 NOTE — Progress Notes (Signed)
Follow-up Outpatient Visit Date: 10/05/2018  Primary Care Provider: [name] [address] [place] [address]  Chief Complaint: Follow-up coronary artery disease  HPI:  Caitlyn Herrera is a 57 y.o. year-old female with history of coronary artery disease status post CABG (10/2017), hypertension, hyperlipidemia, and type 2 diabetes mellitus, who presents for follow-up of coronary artery disease.  She was last seen in our office in September by Ward Givens, NP, at which time she continued to have cough and exertional dyspnea as well as sharp chest pain.  She was seen by Dr. Donata Clay last month and felt to have neuropathic pain.  She has been taking gabapentin but has not noticed significant improvement.  Today, [name] reports that she feels about the same.  She continues to have exertional dyspnea with modest activity.  She also has intermittent sharp shooting pains to the left of the sternum consistent with her prior neuropathic pain.  She denies angina as well as palpitations, lightheadedness, orthopnea, PND, and edema.  She continues to have an intermittent nonproductive cough and has been scheduled to see ENT next month for further evaluation.  She remains compliant with her medications, including aspirin and clopidogrel, without significant bleeding.  --------------------------------------------------------------------------------------------------  Past Medical History:  Diagnosis Date  . Abdominal aortic ectasia (HCC)    a. 11/2017 CTA chest/abd/pelvis: 2.5 cm abd ao ectasia -->rec f/u u/s in 5 yrs.  [name] CAD (coronary artery disease)    a. 11/04/17 Cath: Native multivessel dzs-->CABG x 3 (LIMA->LAD, VG->OM2, VG->RPDA; b. 11/2017 MV: mid antlat/apical isch; c. 11/2017 Cath: LM 30d, LAD 25m, LCX 80p/m, OM1 90, OM2 80 (@ anastamosis of graft), RCA 80p, 159m, 90d, LIMA->LAD nl, VG->OM2 nl, VG->RPDA nl-->Med Rx.  . Carotid arterial disease (HCC)    a. 10/2017 Carotid U/S: 1-30% bilat ICA stenosis.  [name] History of echocardiogram    a. 11/04/2017 Echo: EF of 65-70%, no RWMA, nl LV diastolic fxn, nl RV size/fxn, mild TR; b. 03/2018 Echo: EF 60-65%, mild LVH, no rwma, mild MR. Mildy reduced RV fxn. Mild TR.  [name] History of Tobacco abuse   . Hyperlipidemia   . Hypertension   . Left renal artery stenosis (HCC)    a. 11/2017 CTA Chest/Abd/Pelvis: 50-70% L RA stenosis.  . Palpitations    a. 02/2018 Event Monitor: wore for 2 days, 21 hrs - rare PAC's/PVC's. Avg HR 66 (42-103); b. 04/2018 Event Monitor: Wore for 14 days. RSR, 67 (49-111). No significant arrhythmias.  . Persistent cough   . Pulmonary nodule, right    a. 10/2017 CT Chest: 5mm pulm nodule in R lung apex - rec f/u w/ non-contrast chest CT in 1 year.  . Type II diabetes mellitus (HCC)    a. 10/2017 A1c 6.6.   Past Surgical History:  Procedure Laterality Date  . BREAST BIOPSY Right 06/23/2018   US guided biopsy - awaiting pathology  . BREAST CYST EXCISION Right   . CARDIAC CATHETERIZATION    . CORONARY ARTERY BYPASS GRAFT N/A 11/12/2017   Procedure: CORONARY ARTERY BYPASS GRAFTING (CABG) x Three , using left internal mammary artery and right leg greater saphenous vein harvested endoscopically;  Surgeon: Kerin Perna, MD;  Location: [facility] OR;  Service: Open Heart Surgery;  Laterality: N/A;  . DILATION AND CURETTAGE OF UTERUS    . LAPAROSCOPIC CHOLECYSTECTOMY    . LEFT HEART CATH AND CORONARY ANGIOGRAPHY N/A 11/04/2017   Procedure: LEFT HEART CATH AND CORONARY ANGIOGRAPHY;  Surgeon:  Bradd Merlos, [name], MD;  Location: [facility] INVASIVE CV LAB;  Service: Cardiovascular;  Laterality: N/A;  . LEFT HEART CATH AND CORS/GRAFTS ANGIOGRAPHY N/A 12/09/2017   Procedure: LEFT HEART CATH AND CORS/GRAFTS ANGIOGRAPHY;  Surgeon: SwazilandJordan, Peter M, MD;  [facility];  Service: Cardiovascular;  Laterality: N/A;  . TEE WITHOUT CARDIOVERSION N/A 11/12/2017   Procedure: TRANSESOPHAGEAL ECHOCARDIOGRAM (TEE);   Surgeon: [name], [name], MD;  [facility];  Service: Open Heart Surgery;  Laterality: N/A;    Current Meds  Medication Sig  . albuterol (PROVENTIL HFA;VENTOLIN HFA) 108 (90 Base) MCG/ACT inhaler Inhale 2 puffs into the lungs every 6 (six) hours as needed for wheezing or shortness of breath.  [name]. aspirin EC 81 MG tablet Take 1 tablet (81 mg total) by mouth daily.  [name]. atorvastatin (LIPITOR) 80 MG tablet Take 1 tablet (80 mg total) by mouth daily at 6 PM.  . carvedilol (COREG) 6.25 MG tablet Take 1 tablet (6.25 mg total) by mouth 2 (two) times daily.  . citalopram (CELEXA) 20 MG tablet Take 10 mg by mouth daily.  . clopidogrel (PLAVIX) 75 MG tablet Take 1 tablet (75 mg total) by mouth daily. [facility] Ch[facility]aine Stankus.  . fluticasone furoate-vilanterol (BREO ELLIPTA) 100-25 MCG/INH AEPB Inhale 1 puff into the lungs daily.  [name]. gabapentin (NEURONTIN) 300 MG capsule Take 1 capsule (300 mg total) by mouth 2 (two) times daily.  [name]. lidocaine (XYLOCAINE) 5 % ointment Apply 1 application topically daily as needed.  [name]. losartan (COZAAR) 100 MG tablet Take 100 mg by mouth daily.  . mometasone-formoterol (DULERA) 200-5 MCG/ACT AERO Inhale 2 puffs into the lungs 2 (two) times daily.  . nicotine (NICODERM CQ - DOSED IN MG/24 HOURS) 21 mg/24hr patch Place 21 mg onto the skin as directed.  . ondansetron (ZOFRAN) 4 MG tablet Take 1 tablet (4 mg total) by mouth every 8 (eight) hours as needed for nausea or vomiting.  . pantoprazole (PROTONIX) 40 MG tablet Take 1 tablet (40 mg total) by mouth daily.  . [DISCONTINUED] clopidogrel (PLAVIX) 75 MG tablet Take 1 tablet (75 mg total) by mouth daily.    Allergies: Patient has no known allergies.  Social History   Tobacco Use  . Smoking status: Former Smoker    Packs/day: 1.00    Years: 42.00    Pack years: 42.00    Types: Cigarettes    Last attempt to quit: 11/01/2017    Years since quitting: 0.9  . Smokeless tobacco: Never Used  . Tobacco comment:  11/05/2017 "stopped smoking 11/01/2017"  Substance Use Topics  . Alcohol use: No  . Drug use: No    Family History  Problem Relation Age of Onset  . Hypertension Mother   . Heart Problems Mother   . Breast cancer Neg Hx     Review of Systems: A 12-system review of systems was performed and was negative except as noted in the HPI.  --------------------------------------------------------------------------------------------------  Physical Exam: BP 110/72 (BP Location: Left Arm, Patient Position: Sitting, Cuff Size: Normal)   Pulse 67   Ht 5\' 6"  (1.676 m)   Wt 194 lb 8 oz (88.2 kg)   BMI 31.39 kg/m   General: NAD. HEENT: No conjunctival pallor or scleral icterus. Moist mucous membranes.  OP clear. Neck: Supple without lymphadenopathy, thyromegaly, JVD, or HJR. Lungs: Normal work of breathing. Clear to auscultation bilaterally without wheezes or crackles. Heart: Regular rate and rhythm without murmurs, rubs, or gallops.  Unable to assess PMI due to body habitus. Abd: Bowel sounds present. Soft, NT/ND.  Unable to assess HSM due to body habitus. Ext: No lower extremity edema. Skin: Warm and dry without rash..  Well-healed median sternotomy incision.  EKG: Normal sinus rhythm with poor R wave progression and nonspecific ST/T changes.  Lab Results  Component Value Date   WBC 7.6 03/26/2018   HGB 11.2 03/26/2018   HCT 34.1 03/26/2018   MCV 86 03/26/2018   PLT 454 (H) 03/26/2018    Lab Results  Component Value Date   NA 141 03/26/2018   K 3.9 03/26/2018   CL 105 03/26/2018   CO2 22 03/26/2018   BUN 13 03/26/2018   CREATININE 0.98 03/26/2018   GLUCOSE 91 03/26/2018   ALT 12 (L) 01/09/2018    Lab Results  Component Value Date   CHOL 103 12/17/2017   HDL 31 (L) 12/17/2017   LDLCALC 56 12/17/2017   TRIG 79 12/17/2017   CHOLHDL 3.3 12/17/2017    --------------------------------------------------------------------------------------------------  ASSESSMENT AND  PLAN: Coronary artery disease Ms. Malanga continues to complain of chronic exertional dyspnea as well as some neuropathic pain likely related to sternotomy.  She does not have any angina and continues to gradually improve from her CABG almost a year ago.  We will continue indefinite aspirin but plan to discontinue clopidogrel in mid January after having completed 12 months of DAPT.  Hyperlipidemia Continue high intensity statin therapy.  Most recent LDL at goal.  Hypertension Blood pressure well controlled.  Continue carvedilol and losartan for now.  If chronic cough remains a problem, we may need to consider discontinuation of losartan in favor of a totally different class of agents.  I will defer changes at this time pending ENT consultation next month.  Follow-up: Return to clinic in 4 months.  Yvonne Kendall, MD 10/06/2018 10:36 PM

## 2018-10-05 NOTE — Patient Instructions (Signed)
Medication Instructions:  Your physician has recommended you make the following change in your medication:  1- You May stop Plavix in mid- January.   If you need a refill on your cardiac medications before your next appointment, please call your pharmacy.   Lab work: none If you have labs (blood work) drawn today and your tests are completely normal, you will receive your results only by: [name]. MyChart Message (if you have MyChart) OR . A paper copy in the mail If you have any lab test that is abnormal or we need to change your treatment, we will call you to review the results.  Testing/Procedures: none  Follow-Up: At [facility], you and your health needs are our priority.  As part of our continuing mission to provide you with exceptional heart care, we have created designated Provider Care Teams.  These Care Teams include your primary Cardiologist (physician) and Advanced Practice Providers (APPs -  Physician Assistants and Nurse Practitioners) who all work together to provide you with the care you need, when you need it. You will need a follow up appointment in 4 months.  Please call our office 2 months in advance to schedule this appointment.  You may see Yvonne Kendallhristopher End, MD or one of the following Advanced Practice Providers on your designated Care Team:   Nicolasa Duckinghristopher Berge, NP Eula Listenyan Dunn, PA-C . Marisue IvanJacquelyn Visser, PA-C

## 2018-10-06 ENCOUNTER — Encounter: Payer: Self-pay | Admitting: Internal Medicine

## 2018-11-10 ENCOUNTER — Other Ambulatory Visit: Payer: Self-pay | Admitting: Otolaryngology

## 2018-11-10 DIAGNOSIS — R1310 Dysphagia, unspecified: Secondary | ICD-10-CM

## 2018-11-19 ENCOUNTER — Telehealth: Payer: Self-pay | Admitting: Internal Medicine

## 2018-11-19 NOTE — Telephone Encounter (Signed)
Attempted to schedule April fu . No ans no vm

## 2018-11-19 NOTE — Telephone Encounter (Signed)
-----   Message from Joline Maxcy sent at 10/05/2018  3:45 PM EST ----- Regarding: 4 m recall  Schedule not out for April Dr. Okey Dupre .  Call to schedule when available.

## 2018-11-27 NOTE — Telephone Encounter (Signed)
lmov to schedule  °

## 2018-12-01 NOTE — Telephone Encounter (Signed)
No ans no vm  °I have been unable to reach this patient by phone.  A letter is being sent. ° °

## 2018-12-14 ENCOUNTER — Ambulatory Visit
Admission: RE | Admit: 2018-12-14 | Discharge: 2018-12-14 | Disposition: A | Discharge status: Home / Self Care | Payer: Medicaid Other | Source: Ambulatory Visit | Attending: Otolaryngology | Admitting: Otolaryngology

## 2018-12-14 DIAGNOSIS — R1310 Dysphagia, unspecified: Secondary | ICD-10-CM | POA: Diagnosis present

## 2018-12-14 NOTE — Therapy (Signed)
[facility] DIAGNOSTIC RADIOLOGY [address] [place], [place], [address] Phone: [phone]   Fax:     Modified Barium Swallow  Patient Details  Name: Caitlyn Herrera MRN: [id number] Date of Birth: 1961/09/10 No data recorded  Encounter Date: 12/14/2018  End of Session - 12/14/18 1253    Visit Number  1    Number of Visits  1    Date for SLP Re-Evaluation  12/14/18    SLP Start Time  1214    SLP Stop Time   1254    SLP Time Calculation (min)  40 min    Activity Tolerance  Patient tolerated treatment well       Past Medical History:  Diagnosis Date  . Abdominal aortic ectasia (HCC)    a. 11/2017 CTA chest/abd/pelvis: 2.5 cm abd ao ectasia -->rec f/u u/s in 5 yrs.  [name] CAD (coronary artery disease)    a. 11/04/17 Cath: Native multivessel dzs-->CABG x 3 (LIMA->LAD, VG->OM2, VG->RPDA; b. 11/2017 MV: mid antlat/apical isch; c. 11/2017 Cath: LM 30d, LAD 60m, LCX 80p/m, OM1 90, OM2 80 (@ anastamosis of graft), RCA 80p, 11m, 90d, LIMA->LAD nl, VG->OM2 nl, VG->RPDA nl-->Med Rx.  . Carotid arterial disease (HCC)    a. 10/2017 Carotid U/S: 1-30% bilat ICA stenosis.  [name] History of echocardiogram    a. 11/04/2017 Echo: EF of 65-70%, no RWMA, nl LV diastolic fxn, nl RV size/fxn, mild TR; b. 03/2018 Echo: EF 60-65%, mild LVH, no rwma, mild MR. Mildy reduced RV fxn. Mild TR.  [name] History of Tobacco abuse   . Hyperlipidemia   . Hypertension   . Left renal artery stenosis (HCC)    a. 11/2017 CTA Chest/Abd/Pelvis: 50-70% L RA stenosis.  . Palpitations    a. 02/2018 Event Monitor: wore for 2 days, 21 hrs - rare PAC's/PVC's. Avg HR 66 (42-103); b. 04/2018 Event Monitor: Wore for 14 days. RSR, 67 (49-111). No significant arrhythmias.  . Persistent cough   . Pulmonary nodule, right    a. 10/2017 CT Chest: 32mm pulm nodule in R lung apex - rec f/u w/ non-contrast chest CT in 1 year.  . Type II diabetes mellitus (HCC)    a. 10/2017 A1c 6.6.    Past Surgical History:   Procedure Laterality Date  . BREAST BIOPSY Right 06/23/2018   US guided biopsy - awaiting pathology  . BREAST CYST EXCISION Right   . CARDIAC CATHETERIZATION    . CORONARY ARTERY BYPASS GRAFT N/A 11/12/2017   Procedure: CORONARY ARTERY BYPASS GRAFTING (CABG) x Three , using left internal mammary artery and right leg greater saphenous vein harvested endoscopically;  Surgeon: Kerin Perna, MD;  Location: [facility] OR;  Service: Open Heart Surgery;  Laterality: N/A;  . DILATION AND CURETTAGE OF UTERUS    . LAPAROSCOPIC CHOLECYSTECTOMY    . LEFT HEART CATH AND CORONARY ANGIOGRAPHY N/A 11/04/2017   Procedure: LEFT HEART CATH AND CORONARY ANGIOGRAPHY;  Surgeon: Yvonne Kendall, MD;  Location: [facility] INVASIVE CV LAB;  Service: Cardiovascular;  Laterality: N/A;  . LEFT HEART CATH AND CORS/GRAFTS ANGIOGRAPHY N/A 12/09/2017   Procedure: LEFT HEART CATH AND CORS/GRAFTS ANGIOGRAPHY;  Surgeon: [name], Peter M, MD;  Location: [facility] INVASIVE CV LAB;  Service: Cardiovascular;  Laterality: N/A;  . TEE WITHOUT CARDIOVERSION N/A 11/12/2017   Procedure: TRANSESOPHAGEAL ECHOCARDIOGRAM (TEE);  Surgeon: Donata Clay, Theron Arista, MD;  Location: [facility] OR;  Service: Open Heart Surgery;  Laterality: N/A;    There were no vitals filed for this  visit.   Subjective: Patient behavior: (alertness, ability to follow instructions, etc.):  The patient is alert, able to express her swallowing complaints, and follow directions.  Chief complaint:  Patient reports that food stays in the middle of her throat, this has been happening since surgery a year ago.   Objective:  Radiological Procedure: A videoflouroscopic evaluation of oral-preparatory, reflex initiation, and pharyngeal phases of the swallow was performed; as well as a screening of the upper esophageal phase.  I. POSTURE: Upright in MBS chair and standing  II. VIEW: Lateral and A-P  III. COMPENSATORY STRATEGIES: N/A  IV. BOLUSES ADMINISTERED:   Thin Liquid: 1 small, 3 rapid  consecutive   Nectar-thick Liquid: 2 moderate   Honey-thick Liquid: DNT   Puree: 3 teaspoon presentations   Mechanical Soft: 1/4 graham cracker in applesauce  V. RESULTS OF EVALUATION: A. ORAL PREPARATORY PHASE: (The lips, tongue, and velum are observed for strength and coordination)       **Overall Severity Rating: within functional limits; piecemeal swallowing secondary aversion to taste of PO trials  B. SWALLOW INITIATION/REFLEX: (The reflex is normal if "triggered" by the time the bolus reached the base of the tongue)  **Overall Severity Rating: within normal limits   C. PHARYNGEAL PHASE: (Pharyngeal function is normal if the bolus shows rapid, smooth, and continuous transit through the pharynx and there is no pharyngeal residue after the swallow)  **Overall Severity Rating: within normal limits   D. LARYNGEAL PENETRATION: (Material entering into the laryngeal inlet/vestibule but not aspirated) NONE  E. ASPIRATION: NONE  F. ESOPHAGEAL PHASE: (Screening of the upper esophagus) In the cervical esophagus there is a finger-like protrusion along the posterior wall during swallow (does not impede flow of boluses) consistent with prominent cricopharyngeus.  An esophageal sweep in the upright position with liquid and solid consistencies showed distal-esophageal retention of thick liquid baium with retrograde flow below the level of the pharyngoesophageal segment.     ASSESSMENT: This 58 year old woman; with complaint of globus since cardiac surgery in January 2019; is presenting with minimal oropharyngeal dysphagia characterized by piecemeal oral management.  Otherwise, oral control of the bolus including oral hold, rotary mastication, and anterior to posterior transfer is within functional limits. The patient indicated that the barium impregnated material was unpleasant, hence the piecemeal swallowing.  Timing of pharyngeal swallow initiation is within normal limits.  Aspects of the  pharyngeal stage of swallowing including tongue base retraction, hyolaryngeal excursion, epiglottic inversion, and duration/amplitude of UES opening are within normal limits.  There is no observed pharyngeal residue, laryngeal penetration, or tracheal aspiration.  In the cervical esophagus there is a finger-like protrusion along the posterior wall during swallow (does not impede flow of boluses) consistent with prominent cricopharyngeus.  An esophageal sweep in the upright position with liquid and solid consistencies showed distal-esophageal retention of thick liquid baium with retrograde flow below the level of the pharyngoesophageal segment.   The patient may benefit from further evaluation with GI to assess esophageal function.  PLAN/RECOMMENDATIONS:   A. Diet: Regular   B. Swallowing Precautions: Reflux precautions   C. Recommended consultation to: GI   D. Therapy recommendations: speech therapy is not indicated   E. Results and recommendations were discussed with the patient immediately following the study and the final report routed to the referring MD.     Dysphagia, unspecified type - Plan: DG SWALLOW FUNC W VID CINE SCOUT NECK DELAYED IMAGE WITH BA, DG SWALLOW FUNC W VID CINE SCOUT NECK DELAYED IMAGE WITH  BA        Problem List Patient Active Problem List   Diagnosis Date Noted  . Chronic cough 05/28/2018  . Shortness of breath 03/26/2018  . Essential hypertension 03/26/2018  . Palpitations 03/26/2018  . Chest pressure 12/08/2017  . Unstable angina (HCC) 12/08/2017  . Angina at rest [facility](HCC)   . Hyperlipidemia LDL goal <70   . S/P CABG (coronary artery bypass graft) 11/12/2017  . Non-insulin dependent type 2 diabetes mellitus (HCC) 11/10/2017  . 3-vessel CAD 11/10/2017  . HCVD (hypertensive cardiovascular disease) 11/10/2017  . UTI (urinary tract infection) 11/10/2017  . Pulmonary nodule, right 11/10/2017  . Dyslipidemia 11/07/2017  . Tobacco abuse   . NSTEMI (non-ST  elevated myocardial infarction) (HCC) 11/05/2017   Dollene PrimroseSusan G Stanlee Roehrig, MS/CCC- SLP  Leandrew KoyanagiAbernathy, Susie 12/14/2018, 12:54 PM  [facility] DIAGNOSTIC RADIOLOGY [address] [place], [place], [address] Phone: [phone]   Fax:     Name: Caitlyn Herrera MRN: [id number] Date of Birth: Dec 30, 1960

## 2018-12-16 ENCOUNTER — Encounter: Payer: Self-pay | Admitting: Gastroenterology

## 2019-01-18 ENCOUNTER — Ambulatory Visit: Payer: Medicaid Other | Admitting: Gastroenterology

## 2019-01-25 ENCOUNTER — Ambulatory Visit: Payer: Medicaid Other | Admitting: Gastroenterology

## 2019-02-01 ENCOUNTER — Ambulatory Visit (INDEPENDENT_AMBULATORY_CARE_PROVIDER_SITE_OTHER): Payer: Medicaid Other | Admitting: Gastroenterology

## 2019-02-01 DIAGNOSIS — K219 Gastro-esophageal reflux disease without esophagitis: Secondary | ICD-10-CM

## 2019-02-01 MED ORDER — OMEPRAZOLE 40 MG PO CPDR
40.0000 mg | DELAYED_RELEASE_CAPSULE | Freq: Every day | ORAL | 1 refills | Status: DC
Start: 2019-02-01 — End: 2019-03-29

## 2019-02-01 NOTE — Progress Notes (Addendum)
Lannette Donath, MD [address]  [address]  [place], [place] [address]  Main: [phone]  Fax: [phone]    Gastroenterology Consultation Virtual/Tele Visit  Referring Provider:     Bud Face, MD Primary Care Physician:  [name] Primary Gastroenterologist:  Dr. Arlyss Repress Reason for Consultation:     Dysphagia        HPI:   Caitlyn Herrera is a 58 y.o. female referred by Dr. Eli Phillips, [name]  for consultation & management of chronic dysphagia  Virtual Visit via Telephone Note  I connected with Caitlyn Herrera on 02/01/19 at  9:30 AM EDT by telephone and verified that I am speaking with the correct person using two identifiers.   I discussed the limitations, risks, security and privacy concerns of performing an evaluation and management service by telephone and the availability of in person appointments. I also discussed with the patient that there may be a patient responsible charge related to this service. The patient expressed understanding and agreed to proceed.  Location of the Patient: Outside, in the car, parked  Location of the provider: Home office   History of Present Illness:  Caitlyn Herrera is a 58 year old female with history of hypertension, ex tobacco use, coronary artery disease who underwent triple bypass in 10/2017. She gained about 40 pounds since bypass to date.  She reports that she has been experiencing choking sensation particularly eating hard meats, solid food and when she eats she feels like food makes her choke or throw up, she has to prop her head up, coughs more at night, had to sit up, she has to keep inhlaer, cough drops and a bottle of water next to her bed.  All these symptoms started after her bypass.  She was briefly on Protonix after the bypass.  She is currently on aspirin and Plavix.  She regularly drinks carbonated beverages, consumes fatty foods, red meat.  She has cut  back on red meat as her symptoms are worse with this.  Patient underwent modified barium swallow in 11/2018 which revealed normal pharyngeal function, no evidence of aspiration. An esophageal sweep in the upright position with liquid and solid consistencies showed distal-esophageal retention of thick liquid baium with retrograde flow below the level of the pharyngoesophageal segment.     NSAIDs: None  Antiplts/Anticoagulants/Anti thrombotics: Aspirin and Plavix for coronary disease  GI Procedures: Denies having an EGD or colonoscopy in the past She denies family history of GI malignancy  Past Medical History:  Diagnosis Date   Abdominal aortic ectasia (HCC)    a. 11/2017 CTA chest/abd/pelvis: 2.5 cm abd ao ectasia -->rec f/u u/s in 5 yrs.   CAD (coronary artery disease)    a. 11/04/17 Cath: Native multivessel dzs-->CABG x 3 (LIMA->LAD, VG->OM2, VG->RPDA; b. 11/2017 MV: mid antlat/apical isch; c. 11/2017 Cath: LM 30d, LAD 58m, LCX 80p/m, OM1 90, OM2 80 (@ anastamosis of graft), RCA 80p, 173m, 90d, LIMA->LAD nl, VG->OM2 nl, VG->RPDA nl-->Med Rx.   Carotid arterial disease (HCC)    a. 10/2017 Carotid U/S: 1-30% bilat ICA stenosis.   History of echocardiogram    a. 11/04/2017 Echo: EF of 65-70%, no RWMA, nl LV diastolic fxn, nl RV size/fxn, mild TR; b. 03/2018 Echo: EF 60-65%, mild LVH, no rwma, mild MR. Mildy reduced RV fxn. Mild TR.   History of Tobacco abuse    Hyperlipidemia    Hypertension    Left renal artery stenosis (HCC)  a. 11/2017 CTA Chest/Abd/Pelvis: 50-70% L RA stenosis.   Palpitations    a. 02/2018 Event Monitor: wore for 2 days, 21 hrs - rare PAC's/PVC's. Avg HR 66 (42-103); b. 04/2018 Event Monitor: Wore for 14 days. RSR, 67 (49-111). No significant arrhythmias.   Persistent cough    Pulmonary nodule, right    a. 10/2017 CT Chest: 5mm pulm nodule in R lung apex - rec f/u w/ non-contrast chest CT in 1 year.   Type II diabetes mellitus (HCC)    a. 10/2017 A1c 6.6.    Past  Surgical History:  Procedure Laterality Date   BREAST BIOPSY Right 06/23/2018   US guided biopsy - awaiting pathology   BREAST CYST EXCISION Right    CARDIAC CATHETERIZATION     CORONARY ARTERY BYPASS GRAFT N/A 11/12/2017   Procedure: CORONARY ARTERY BYPASS GRAFTING (CABG) x Three , using left internal mammary artery and right leg greater saphenous vein harvested endoscopically;  Surgeon: [name], Peter, MD;  [facility];  Service: Open Heart Surgery;  Laterality: N/A;   DILATION AND CURETTAGE OF UTERUS     LAPAROSCOPIC CHOLECYSTECTOMY     LEFT HEART CATH AND CORONARY ANGIOGRAPHY N/A 11/04/2017   Procedure: LEFT HEART CATH AND CORONARY ANGIOGRAPHY;  Surgeon: [name], Christopher, MD;  Location: [facility] INVASIVE CV LAB;  Service: Cardiovascular;  Laterality: N/A;   LEFT HEART CATH AND CORS/GRAFTS ANGIOGRAPHY N/A 12/09/2017   Procedure: LEFT HEART CATH AND CORS/GRAFTS ANGIOGRAPHY;  Surgeon: SwazilandJordan, Peter M, MD;  [facility];  Service: Cardiovascular;  Laterality: N/A;   TEE WITHOUT CARDIOVERSION N/A 11/12/2017   Procedure: TRANSESOPHAGEAL ECHOCARDIOGRAM (TEE);  Surgeon: [name], [name], MD;  [facility];  Service: Open Heart Surgery;  Laterality: N/A;    Current Outpatient Medications:    albuterol (PROVENTIL HFA;VENTOLIN HFA) 108 (90 Base) MCG/ACT inhaler, Inhale 2 puffs into the lungs every 6 (six) hours as needed for wheezing or shortness of breath., Disp: 8 g, Rfl: 2   aspirin EC 81 MG tablet, Take 1 tablet (81 mg total) by mouth daily., Disp: 90 tablet, Rfl: 3   atorvastatin (LIPITOR) 80 MG tablet, Take 1 tablet (80 mg total) by mouth daily at 6 PM., Disp: 30 tablet, Rfl: 3   azelastine (ASTELIN) 0.1 % nasal spray, INSTILL 1 SPRAY INTO EACH NOSTRIL PRN FOR ALLERGIES AND DRAINAGE, Disp: , Rfl:    carvedilol (COREG) 6.25 MG tablet, Take 1 tablet (6.25 mg total) by mouth 2 (two) times daily., Disp: 180 tablet, Rfl: 3   citalopram (CELEXA) 40 MG tablet, TAKE 1  TABLET BY MOUTH ONCE DAILY FOR DEPRESSION, Disp: , Rfl:    clopidogrel (PLAVIX) 75 MG tablet, Take 1 tablet (75 mg total) by mouth daily. [facility]., Disp: 90 tablet, Rfl: 3   cyclobenzaprine (FLEXERIL) 10 MG tablet, TK 1 T PO TID, Disp: , Rfl:    fluticasone furoate-vilanterol (BREO ELLIPTA) 100-25 MCG/INH AEPB, Inhale 1 puff into the lungs daily., Disp: , Rfl:    gabapentin (NEURONTIN) 300 MG capsule, Take 1 capsule (300 mg total) by mouth 2 (two) times daily., Disp: 60 capsule, Rfl: 3   lidocaine (XYLOCAINE) 5 % ointment, Apply 1 application topically daily as needed., Disp: , Rfl:    losartan (COZAAR) 100 MG tablet, Take 100 mg by mouth daily., Disp: , Rfl:    metoprolol tartrate (LOPRESSOR) 25 MG tablet, TK 1 T PO BID, Disp: , Rfl:    mometasone-formoterol (DULERA)  200-5 MCG/ACT AERO, Inhale 2 puffs into the lungs 2 (two) times daily., Disp: , Rfl:    nicotine (NICODERM CQ - DOSED IN MG/24 HOURS) 21 mg/24hr patch, Place 21 mg onto the skin as directed., Disp: , Rfl:    ondansetron (ZOFRAN) 4 MG tablet, Take 1 tablet (4 mg total) by mouth every 8 (eight) hours as needed for nausea or vomiting., Disp: 20 tablet, Rfl: 0   traMADol (ULTRAM) 50 MG tablet, TK 1 T PO Q 6 H, Disp: , Rfl:    omeprazole (PRILOSEC) 40 MG capsule, Take 1 capsule (40 mg total) by mouth daily before breakfast for 30 days., Disp: 30 capsule, Rfl: 1   Family History  Problem Relation Age of Onset   Hypertension Mother    Heart Problems Mother    Breast cancer Neg Hx      Social History   Tobacco Use   Smoking status: Former Smoker    Packs/day: 1.00    Years: 42.00    Pack years: 42.00    Types: Cigarettes    Last attempt to quit: 11/01/2017    Years since quitting: 1.2   Smokeless tobacco: Never Used   Tobacco comment: 11/05/2017 "stopped smoking 11/01/2017"  Substance Use Topics   Alcohol use: No   Drug use: No    Allergies as of 02/01/2019   (No Known  Allergies)     Imaging Studies: Reviewed  Assessment and Plan:   Caitlyn Herrera is a 59 y.o. female with obesity, hypertension, hyperlipidemia, history of tobacco use, coronary disease status post triple bypass in 10/2017 on DAPT presents with chronic symptoms of dysphagia and reflux since triple bypass.  Patient gained about 40 pounds since bypass.  Her symptoms are highly consistent with poorly controlled acid reflux given her lifestyle and eating habits.  She has not tried any acid suppressive therapy.  Recommend trial of omeprazole 40 mg daily before breakfast or dinner for at least 4 weeks Highly advised her about lifestyle modification, antireflux lifestyle changes Advised her to completely eliminate carbonated beverages and red meat Recommend nonurgent EGD for evaluation of reflux and other underlying structural causes  She is also overdue for screening colonoscopy.  Recommend to schedule colonoscopy along with EGD in next 2 to 3 months  Follow Up Instructions:   I discussed the assessment and treatment plan with the patient. The patient was provided an opportunity to ask questions and all were answered. The patient agreed with the plan and demonstrated an understanding of the instructions.   The patient was advised to call back or seek an in-person evaluation if the symptoms worsen or if the condition fails to improve as anticipated.  I provided 20 minutes of non-face-to-face time during this encounter.   Follow up in 4 weeks   Arlyss Repress, MD

## 2019-02-08 ENCOUNTER — Telehealth: Payer: Self-pay | Admitting: Gastroenterology

## 2019-02-08 NOTE — Telephone Encounter (Signed)
She can try protonix 40mg  daily, just give 2 weeks supply to make sure she tolerates it  RV

## 2019-02-08 NOTE — Telephone Encounter (Signed)
Pt was prescribed rx omeprazole 40 mg but she states she can not take it it makes her chest hurt  And gives her heartburn  She would like a different rx called in send to [address]

## 2019-02-08 NOTE — Telephone Encounter (Signed)
Dr Allegra Lai please advise, pt is unable to tolerate Omeprazole and would like a different medication.

## 2019-02-10 ENCOUNTER — Other Ambulatory Visit: Payer: Self-pay

## 2019-02-10 MED ORDER — PANTOPRAZOLE SODIUM 40 MG PO TBEC: 40.0000 mg | DELAYED_RELEASE_TABLET | Freq: Every day | ORAL | 0 refills | Status: DC

## 2019-02-10 NOTE — Telephone Encounter (Signed)
Prescription for Protonix 40 mg daily has been sent to pharmacy and pt has been notified, pt verbalized understanding

## 2019-02-12 ENCOUNTER — Telehealth: Payer: Self-pay | Admitting: *Deleted

## 2019-02-12 NOTE — Telephone Encounter (Signed)
Virtual Visit Pre-Appointment Phone Call  Steps For Call:  1. Confirm consent - "In the setting of the current Covid19 crisis, you are scheduled for a (VIDEO) visit with your provider on (02/22/2019) at (2:30PM).  Just as we do with many in-office visits, in order for you to participate in this visit, we must obtain consent.  If you'd like, I can send this to your mychart (if signed up) or email for you to review.  Otherwise, I can obtain your verbal consent now.  All virtual visits are billed to your insurance company just like a normal visit would be.  By agreeing to a virtual visit, we'd like you to understand that the technology does not allow for your provider to perform an examination, and thus may limit your provider's ability to fully assess your condition. If your provider identifies any concerns that need to be evaluated in person, we will make arrangements to do so.  Finally, though the technology is pretty good, we cannot assure that it will always work on either your or our end, and in the setting of a video visit, we may have to convert it to a phone-only visit.  In either situation, we cannot ensure that we have a secure connection.  Are you willing to proceed?" YES  2. Confirm the BEST phone number to call the day of the visit by including in appointment notes  3. Give patient instructions for WebEx/MyChart download to smartphone as below or Doximity/Doxy.me if video visit (depending on what platform provider is using)  4. Advise patient to be prepared with their blood pressure, heart rate, weight, any heart rhythm information, their current medicines, and a piece of paper and pen handy for any instructions they may receive the day of their visit  5. Inform patient they will receive a phone call 15 minutes prior to their appointment time (may be from unknown caller ID) so they should be prepared to answer  6. Confirm that appointment type is correct in Epic appointment notes (VIDEO  vs PHONE)     TELEPHONE CALL NOTE  LAKYLA GUPPY has been deemed a candidate for a follow-up tele-health visit to limit community exposure during the Covid-19 pandemic. I spoke with the patient via phone to ensure availability of phone/video source, confirm preferred email & phone number, and discuss instructions and expectations.  I reminded Caitlyn Herrera to be prepared with any vital sign and/or heart rhythm information that could potentially be obtained via home monitoring, at the time of her visit. I reminded Caitlyn Herrera to expect a phone call at the time of her visit if her visit.  LOPEZ, MARINA C, CMA 02/12/2019 1:18 PM   INSTRUCTIONS FOR DOWNLOADING THE WEBEX APP TO SMARTPHONE  - If Apple, ask patient to go to [employer] and type in WebEx in the search bar. Download [employer], the blue/green circle. If Android, go to [employer] and type in [employer] in the search bar. The app is free but as with any other app downloads, their phone may require them to verify saved payment information or Apple/Android password.  - The patient does NOT have to create an account. - On the day of the visit, the assist will walk the patient through joining the meeting with the meeting number/password.  INSTRUCTIONS FOR DOWNLOADING THE MYCHART APP TO SMARTPHONE  - The patient must first make sure to have activated MyChart and know their login information - If Apple, go  to [employer] and type in MyChart in the search bar and download the app. If Android, ask patient to go to [employer] Play Store and type in CongersMyChart in the search bar and download the app. The app is free but as with any other app downloads, their phone may require them to verify saved payment information or Apple/Android password.  - The patient will need to then log into the app with their MyChart username and password, and select [facility] as their healthcare provider to link the account. When it is time for your  visit, go to the MyChart app, find appointments, and click Begin Video Visit. Be sure to Select Allow for your device to access the Microphone and Camera for your visit. You will then be connected, and your provider will be with you shortly.  **If they have any issues connecting, or need assistance please contact MyChart service desk [phone])**  **If using a computer, in order to ensure the best quality for their visit they will need to use either of the following Internet Browsers: [employer] Edge, or Google Chrome**  IF USING DOXIMITY or DOXY.ME - The patient will receive a link just prior to their visit, either by text or email (to be determined day of appointment depending on if it's doxy.me or Doximity).     FULL LENGTH CONSENT FOR TELE-HEALTH VISIT   I hereby voluntarily request, consent and authorize [facility] and its employed or contracted physicians, physician assistants, nurse practitioners or other licensed health care professionals (the Practitioner), to provide me with telemedicine health care services (the "Services") as deemed necessary by the treating Practitioner. I acknowledge and consent to receive the Services by the Practitioner via telemedicine. I understand that the telemedicine visit will involve communicating with the Practitioner through live audiovisual communication technology and the disclosure of certain medical information by electronic transmission. I acknowledge that I have been given the opportunity to request an in-person assessment or other available alternative prior to the telemedicine visit and am voluntarily participating in the telemedicine visit.  I understand that I have the right to withhold or withdraw my consent to the use of telemedicine in the course of my care at any time, without affecting my right to future care or treatment, and that the Practitioner or I may terminate the telemedicine visit at any time. I understand that I have  the right to inspect all information obtained and/or recorded in the course of the telemedicine visit and may receive copies of available information for a reasonable fee.  I understand that some of the potential risks of receiving the Services via telemedicine include:  [name]. Delay or interruption in medical evaluation due to technological equipment failure or disruption; . Information transmitted may not be sufficient (e.g. poor resolution of images) to allow for appropriate medical decision making by the Practitioner; and/or  . In rare instances, security protocols could fail, causing a breach of personal health information.  Furthermore, I acknowledge that it is my responsibility to provide information about my medical history, conditions and care that is complete and accurate to the best of my ability. I acknowledge that Practitioner's advice, recommendations, and/or decision may be based on factors not within their control, such as incomplete or inaccurate data provided by me or distortions of diagnostic images or specimens that may result from electronic transmissions. I understand that the practice of medicine is not an exact science and that Practitioner makes no warranties or guarantees regarding treatment outcomes. I acknowledge  that I will receive a copy of this consent concurrently upon execution via email to the email address I last provided but may also request a printed copy by calling the office of [facility].    I understand that my insurance will be billed for this visit.   I have read or had this consent read to me. . I understand the contents of this consent, which adequately explains the benefits and risks of the Services being provided via telemedicine.  . I have been provided ample opportunity to ask questions regarding this consent and the Services and have had my questions answered to my satisfaction. . I give my informed consent for the services to be provided through the use  of telemedicine in my medical care  By participating in this telemedicine visit I agree to the above.

## 2019-02-17 ENCOUNTER — Other Ambulatory Visit: Payer: Self-pay

## 2019-02-17 DIAGNOSIS — K219 Gastro-esophageal reflux disease without esophagitis: Secondary | ICD-10-CM

## 2019-02-17 DIAGNOSIS — R1310 Dysphagia, unspecified: Secondary | ICD-10-CM

## 2019-02-17 DIAGNOSIS — Z1211 Encounter for screening for malignant neoplasm of colon: Secondary | ICD-10-CM

## 2019-02-19 ENCOUNTER — Ambulatory Visit: Payer: Medicaid Other | Admitting: Internal Medicine

## 2019-02-22 ENCOUNTER — Other Ambulatory Visit: Payer: Self-pay

## 2019-02-22 ENCOUNTER — Telehealth (INDEPENDENT_AMBULATORY_CARE_PROVIDER_SITE_OTHER): Payer: Medicaid Other | Admitting: Internal Medicine

## 2019-02-22 VITALS — BP 140/80 | HR 86 | Ht 66.0 in | Wt 200.0 lb

## 2019-02-22 DIAGNOSIS — I2510 Atherosclerotic heart disease of native coronary artery without angina pectoris: Secondary | ICD-10-CM | POA: Insufficient documentation

## 2019-02-22 DIAGNOSIS — I10 Essential (primary) hypertension: Secondary | ICD-10-CM | POA: Diagnosis not present

## 2019-02-22 DIAGNOSIS — E785 Hyperlipidemia, unspecified: Secondary | ICD-10-CM

## 2019-02-22 DIAGNOSIS — I25118 Atherosclerotic heart disease of native coronary artery with other forms of angina pectoris: Secondary | ICD-10-CM | POA: Insufficient documentation

## 2019-02-22 MED ORDER — CARVEDILOL 12.5 MG PO TABS: 12.5000 mg | ORAL_TABLET | Freq: Two times a day (BID) | ORAL | 3 refills | Status: DC

## 2019-02-22 NOTE — Patient Instructions (Addendum)
Medication Instructions:  - Your physician has recommended you make the following change in your medication:   1) Stop metoprolol  2) Increase coreg (carvedilol) to 12.5 mg- take 1 tablet by mouth twice daily  3) Stop plavix (clopidgrel)  If you need a refill on your cardiac medications before your next appointment, please call your pharmacy.   Lab work: - Your physician recommends that you return for FASTING lab work in: 2-3 months - CMET/ Lipid panel/ CBC (we will call you to schedule in about 2 months and reassess where we are with COVID restrictions at that time.)  If you have labs (blood work) drawn today and your tests are completely normal, you will receive your results only by: [name] MyChart Message (if you have MyChart) OR . A paper copy in the mail If you have any lab test that is abnormal or we need to change your treatment, we will call you to review the results.  Testing/Procedures: - none ordered  Follow-Up: At [facility], you and your health needs are our priority.  As part of our continuing mission to provide you with exceptional heart care, we have created designated Provider Care Teams.  These Care Teams include your primary Cardiologist (physician) and Advanced Practice Providers (APPs -  Physician Assistants and Nurse Practitioners) who all work together to provide you with the care you need, when you need it.  You will need a follow up appointment in 6 months.   Please call our office 2 months in advance to schedule this appointment.  (Please call in early August to schedule)  You may see Yvonne Kendall, MD or one of the following Advanced Practice Providers on your designated Care Team:   Nicolasa Ducking, NP Eula Listen, PA-C . Marisue Ivan, PA-C  Any Other Special Instructions Will Be Listed Below (If Applicable). - N/A

## 2019-02-22 NOTE — Progress Notes (Signed)
Virtual Visit via Telephone Note   This visit type was conducted due to national recommendations for restrictions regarding the COVID-19 Pandemic (e.g. social distancing) in an effort to limit this patient's exposure and mitigate transmission in our community.  Due to her co-morbid illnesses, this patient is at least at moderate risk for complications without adequate follow up.  This format is felt to be most appropriate for this patient at this time.  The patient did not have access to video technology/had technical difficulties with video requiring transitioning to audio format only (telephone).  All issues noted in this document were discussed and addressed.  No physical exam could be performed with this format.  Please refer to the patient's chart for her  consent to telehealth for [facility].   Evaluation Performed:  Follow-up visit  Date:  02/22/2019   ID:  Caitlyn Herrera, Caitlyn Herrera 12/18/1960, MRN [id number]  Patient Location: Home Provider Location: Home  PCP:  [facility] [place]  Cardiologist:  Nelva Bush, MD  Electrophysiologist:  None   Chief Complaint: Follow-up coronary artery disease  History of Present Illness:    Caitlyn Herrera is a 58 y.o. female with history of coronary artery disease status post CABG (10/2017), hypertension, hyperlipidemia, and type 2 diabetes mellitus.  We are speaking today for follow-up of her CAD.  I last saw her in December, at which time she continued to experience exertional dyspnea with modest activity.  She also noted to [place] shooting pains around her sternotomy incision.  Today, Ms. Depierro reports that she is doing relatively well.  She has a chronic cough that began after CABG.  It is intermittently productive, currently with mild mucus production.  She has been seen by ENT and was placed on pantoprazole.  She did not tolerate this well due to left upper chest pain.  Since stopping pantoprazole, this has  resolved.  She still notes some soreness around her incision, particularly when lying flat.  Otherwise, she has been chest pain-free.  She has stable exertional dyspnea.  She denies palpitations, lightheadedness, and edema.  She has not been able to return to work, citing leg pain and dyspnea on exertion as limiting factors.  She is tolerating her medications well, though on further review, it appears that she is taking both carvedilol and metoprolol.  She notes that her home blood pressure readings are consistent with what she measured today.  The patient does not have symptoms concerning for COVID-19 infection (fever, chills, cough, or new shortness of breath).    Past Medical History:  Diagnosis Date  . Abdominal aortic ectasia ([place])    a. 11/2017 CTA chest/abd/pelvis: 2.5 cm abd ao ectasia -->rec f/u u/s in 5 yrs.  [name] CAD (coronary artery disease)    a. 11/04/17 Cath: Native multivessel dzs-->CABG x 3 (LIMA->LAD, VG->OM2, VG->RPDA; b. 11/2017 MV: mid antlat/apical isch; c. 11/2017 Cath: LM 30d, LAD 80m LCX 80p/m, OM1 90, OM2 80 (@ anastamosis of graft), RCA 80p, 1032m90d, LIMA->LAD nl, VG->OM2 nl, VG->RPDA nl-->Med Rx.  . Car[place]id arterial [place]   a. 10/2017 Carotid U/S: 1-30% bilat ICA stenosis.  . Marland Kitchenistory of echocardiogram    a. 11/04/2017 Echo: EF of 65-70%, no RWMA, nl LV diastolic fxn, nl RV size/fxn, mild TR; b. 03/2018 Echo: EF 60-65%, mild LVH, no rwma, mild MR. Mildy reduced RV fxn. Mild TR.  . Marland Kitchenistory of Tobacco abuse   . Hyperlipidemia   . Hypertension   . Left renal  artery stenosis ([place])    a. 11/2017 CTA Chest/Abd/Pelvis: 50-70% L RA stenosis.  . Palpitations    a. 02/2018 Event Monitor: wore for 2 days, 21 hrs - rare PAC's/PVC's. Avg HR 66 (42-103); b. 04/2018 Event Monitor: Wore for 14 days. RSR, 67 (49-111). No significant arrhythmias.  . Persistent cough   . Pulmonary nodule, right    a. 10/2017 CT Chest: 75m pulm nodule in R lung apex - rec f/u w/ non-contrast chest CT in 1  year.  . Type II diabetes mellitus ([place]    a. 10/2017 A1c 6.6.   Past Surgical History:  Procedure Laterality Date  . BREAST BIOPSY Right 06/23/2018   [place] pathology  . BREAST CYST EXCISION Right   . CARDIAC CATHETERIZATION    . CORONARY ARTERY BYPASS GRAFT N/A 11/12/2017   Procedure: CORONARY ARTERY BYPASS GRAFTING (CABG) x Three , using left internal mammary artery and right leg greater saphenous vein harvested endoscopically;  Surgeon: VIvin Poot MD;  [place]  Service: Open Heart Surgery;  Laterality: N/A;  . DILATION AND CURETTAGE OF UTERUS    . LAPAROSCOPIC CHOLECYSTECTOMY    . LEFT HEART CATH AND CORONARY ANGIOGRAPHY N/A 11/04/2017   Procedure: LEFT HEART CATH AND CORONARY ANGIOGRAPHY;  Surgeon: ENelva Bush MD;  [place];  Service: Cardiovascular;  Laterality: N/A;  . LEFT HEART CATH AND CORS/GRAFTS ANGIOGRAPHY N/A 12/09/2017   Procedure: LEFT HEART CATH AND CORS/GRAFTS ANGIOGRAPHY;  Surgeon: JMartinique Peter M, MD;  [place];  Service: Cardiovascular;  Laterality: N/A;  . TEE WITHOUT CARDIOVERSION N/A 11/12/2017   Procedure: TRANSESOPHAGEAL ECHOCARDIOGRAM (TEE);  Surgeon: VPrescott Gum PCollier Salina MD;  [place]  Service: Open Heart Surgery;  Laterality: N/A;     Current Meds  Medication Sig  . albuterol (PROVENTIL HFA;VENTOLIN HFA) 108 (90 Base) MCG/ACT inhaler Inhale 2 puffs into the lungs every 6 (six) hours as needed for wheezing or shortness of breath.  .Marland Kitchenaspirin EC 81 MG tablet Take 1 tablet (81 mg total) by mouth daily.  .Marland Kitchenatorvastatin (LIPITOR) 80 MG tablet Take 1 tablet (80 mg total) by mouth daily at 6 PM.  . azelastine (ASTELIN) 0.1 % nasal spray INSTILL 1 SPRAY INTO EACH NOSTRIL PRN FOR ALLERGIES AND DRAINAGE  . carvedilol (COREG) 6.25 MG tablet Take 1 tablet (6.25 mg total) by mouth 2 (two) times daily.  . citalopram (CELEXA) 40 MG tablet TAKE 1 TABLET BY MOUTH ONCE DAILY FOR DEPRESSION  .  clopidogrel (PLAVIX) 75 MG tablet Take 1 tablet (75 mg total) by mouth daily. [facility] Gray [facility].  . cyclobenzaprine (FLEXERIL) 10 MG tablet TK 1 T PO TID  . fluticasone furoate-vilanterol (BREO ELLIPTA) 100-25 MCG/INH AEPB Inhale 1 puff into the lungs daily.  .Marland Kitchengabapentin (NEURONTIN) 300 MG capsule Take 1 capsule (300 mg total) by mouth 2 (two) times daily.  .Marland Kitchenlidocaine (XYLOCAINE) 5 % ointment Apply 1 application topically daily as needed.  .Marland Kitchenlosartan (COZAAR) 100 MG tablet Take 100 mg by mouth daily.  . metoprolol tartrate (LOPRESSOR) 25 MG tablet TK 1 T PO BID  . mometasone-formoterol (DULERA) 200-5 MCG/ACT AERO Inhale 2 puffs into the lungs 2 (two) times daily.  . nicotine (NICODERM CQ - DOSED IN MG/24 HOURS) 21 mg/24hr patch Place 21 mg onto the skin as directed.  .Marland Kitchenomeprazole (PRILOSEC) 40 MG capsule Take 1 capsule (40 mg total) by mouth daily before  breakfast for 30 days.  . ondansetron (ZOFRAN) 4 MG tablet Take 1 tablet (4 mg total) by mouth every 8 (eight) hours as needed for nausea or vomiting.  . traMADol (ULTRAM) 50 MG tablet TK 1 T PO Q 6 H     Allergies:   Patient has no known allergies.   Social History   Tobacco Use  . Smoking status: Former Smoker    Packs/day: 1.00    Years: 42.00    Pack years: 42.00    Types: Cigarettes    Last attempt to quit: 11/01/2017    Years since quitting: 1.3  . Smokeless tobacco: Never Used  . Tobacco comment: 11/05/2017 "stopped smoking 11/01/2017"  Substance Use Topics  . Alcohol use: No  . Drug use: No     Family Hx: The patient's family history includes Heart Problems in her mother; Hypertension in her mother. There is no history of Breast cancer.  ROS:   Please see the history of present illness.   All other systems reviewed and are negative.   Prior CV studies:   The following studies were reviewed today:  14-day event monitor (05/12/2018): Sinus rhythm without significant arrhythmias.  Study significant  limited by short monitoring period (only 20% of monitoring period yielded diagnostic tracings).  Echocardiogram (04/13/2018): Upper normal LV size with mild LVH.  LVEF 60-65% with normal wall motion and diastolic function.  Mild mitral regurgitation.  Normal RV size with mildly reduced contraction.  Mild tricuspid regurgitation.  Labs/Other Tests and Data Reviewed:    EKG:  No ECG reviewed.  Recent Labs: 03/26/2018: BUN 13; Creatinine, Ser 0.98; Hemoglobin 11.2; Magnesium 2.0; Platelets 454; Potassium 3.9; Sodium 141   Recent Lipid Panel Lab Results  Component Value Date/Time   CHOL 103 12/17/2017 10:15 AM   TRIG 79 12/17/2017 10:15 AM   HDL 31 (L) 12/17/2017 10:15 AM   CHOLHDL 3.3 12/17/2017 10:15 AM   CHOLHDL 6.7 11/04/2017 04:13 AM   LDLCALC 56 12/17/2017 10:15 AM    Wt Readings from Last 3 Encounters:  02/22/19 200 lb (90.7 kg)  10/05/18 194 lb 8 oz (88.2 kg)  09/16/18 190 lb (86.2 kg)     Objective:    Vital Signs:  BP 140/80 (BP Location: Left Arm, Patient Position: Sitting, Cuff Size: Normal)   Pulse 86   Ht '5\' 6"'$  (1.676 m)   Wt 200 lb (90.7 kg)   BMI 32.28 kg/m    VITAL SIGNS:  reviewed  ASSESSMENT & PLAN:    Coronary artery disease: Patient now more than 12 months out from an STEMI and urgent CABG.  Chest pain is not typical for angina.  I think is reasonable to discontinue clopidogrel at this time.  She will remain on indefinite aspirin 81 mg daily as well as statin therapy.  Hyperlipidemia: Continue high intensity statin therapy.  We will plan to obtain a fasting lipid panel, CMP, and CBC in about 2 to 3 months.  Hypertension: Blood pressure suboptimally controlled.  It turns out that she is taking carvedilol and metoprolol at this time in addition to losartan 100 mg daily.  I have asked her to stop taking metoprolol and to increase carvedilol to 12.5 mg twice daily.  She should continue to monitor her blood pressure and let us know if it is consistently above  140/90.  COVID-19 Education: The signs and symptoms of COVID-19 were discussed with the patient and how to seek care for testing (follow up with PCP or arrange  E-visit).  The importance of social distancing was discussed today.  Time:   Today, I have spent 15 minutes with the patient with telehealth technology discussing the above problems.     Medication Adjustments/Labs and Tests Ordered: Current medicines are reviewed at length with the patient today.  Concerns regarding medicines are outlined above.   Tests Ordered: Orders Placed This Encounter  Procedures  . Comp Met (CMET)  . CBC w/Diff  . Lipid Profile    Medication Changes: Meds ordered this encounter  Medications  . carvedilol (COREG) 12.5 MG tablet    Sig: Take 1 tablet (12.5 mg total) by mouth 2 (two) times daily.    Dispense:  180 tablet    Refill:  3    Disposition:  Follow up in 6 month(s)  Signed, Nelva Bush, MD  02/22/2019 2:38 PM    [place]

## 2019-03-16 ENCOUNTER — Other Ambulatory Visit: Payer: Self-pay

## 2019-03-16 ENCOUNTER — Encounter: Payer: Self-pay | Admitting: Gastroenterology

## 2019-03-16 ENCOUNTER — Ambulatory Visit: Payer: Medicaid Other | Admitting: Gastroenterology

## 2019-03-27 ENCOUNTER — Other Ambulatory Visit: Payer: Self-pay | Admitting: Gastroenterology

## 2019-03-27 DIAGNOSIS — K219 Gastro-esophageal reflux disease without esophagitis: Secondary | ICD-10-CM

## 2019-04-02 ENCOUNTER — Other Ambulatory Visit: Admission: RE | Admit: 2019-04-02 | Payer: Medicaid Other | Source: Ambulatory Visit

## 2019-04-05 ENCOUNTER — Other Ambulatory Visit: Admission: RE | Admit: 2019-04-05 | Payer: Medicaid Other | Source: Ambulatory Visit

## 2019-04-07 ENCOUNTER — Telehealth: Payer: Self-pay

## 2019-04-07 DIAGNOSIS — E785 Hyperlipidemia, unspecified: Secondary | ICD-10-CM

## 2019-04-07 DIAGNOSIS — I2510 Atherosclerotic heart disease of native coronary artery without angina pectoris: Secondary | ICD-10-CM

## 2019-04-07 DIAGNOSIS — R0602 Shortness of breath: Secondary | ICD-10-CM

## 2019-04-08 ENCOUNTER — Encounter: Admission: RE | Payer: Self-pay | Source: Home / Self Care

## 2019-04-08 ENCOUNTER — Ambulatory Visit: Admission: RE | Admit: 2019-04-08 | Payer: Medicaid Other | Source: Home / Self Care | Admitting: Gastroenterology

## 2019-04-08 SURGERY — ESOPHAGOGASTRODUODENOSCOPY (EGD) WITH PROPOFOL
Anesthesia: General

## 2019-04-11 IMAGING — CR DG CHEST 2V
2 series · 2 of 2 positions shown · non-contrast
Comparison: None.

CLINICAL DATA: Left-sided chest pain.

EXAM:
CHEST  2 VIEW

[chest pa]
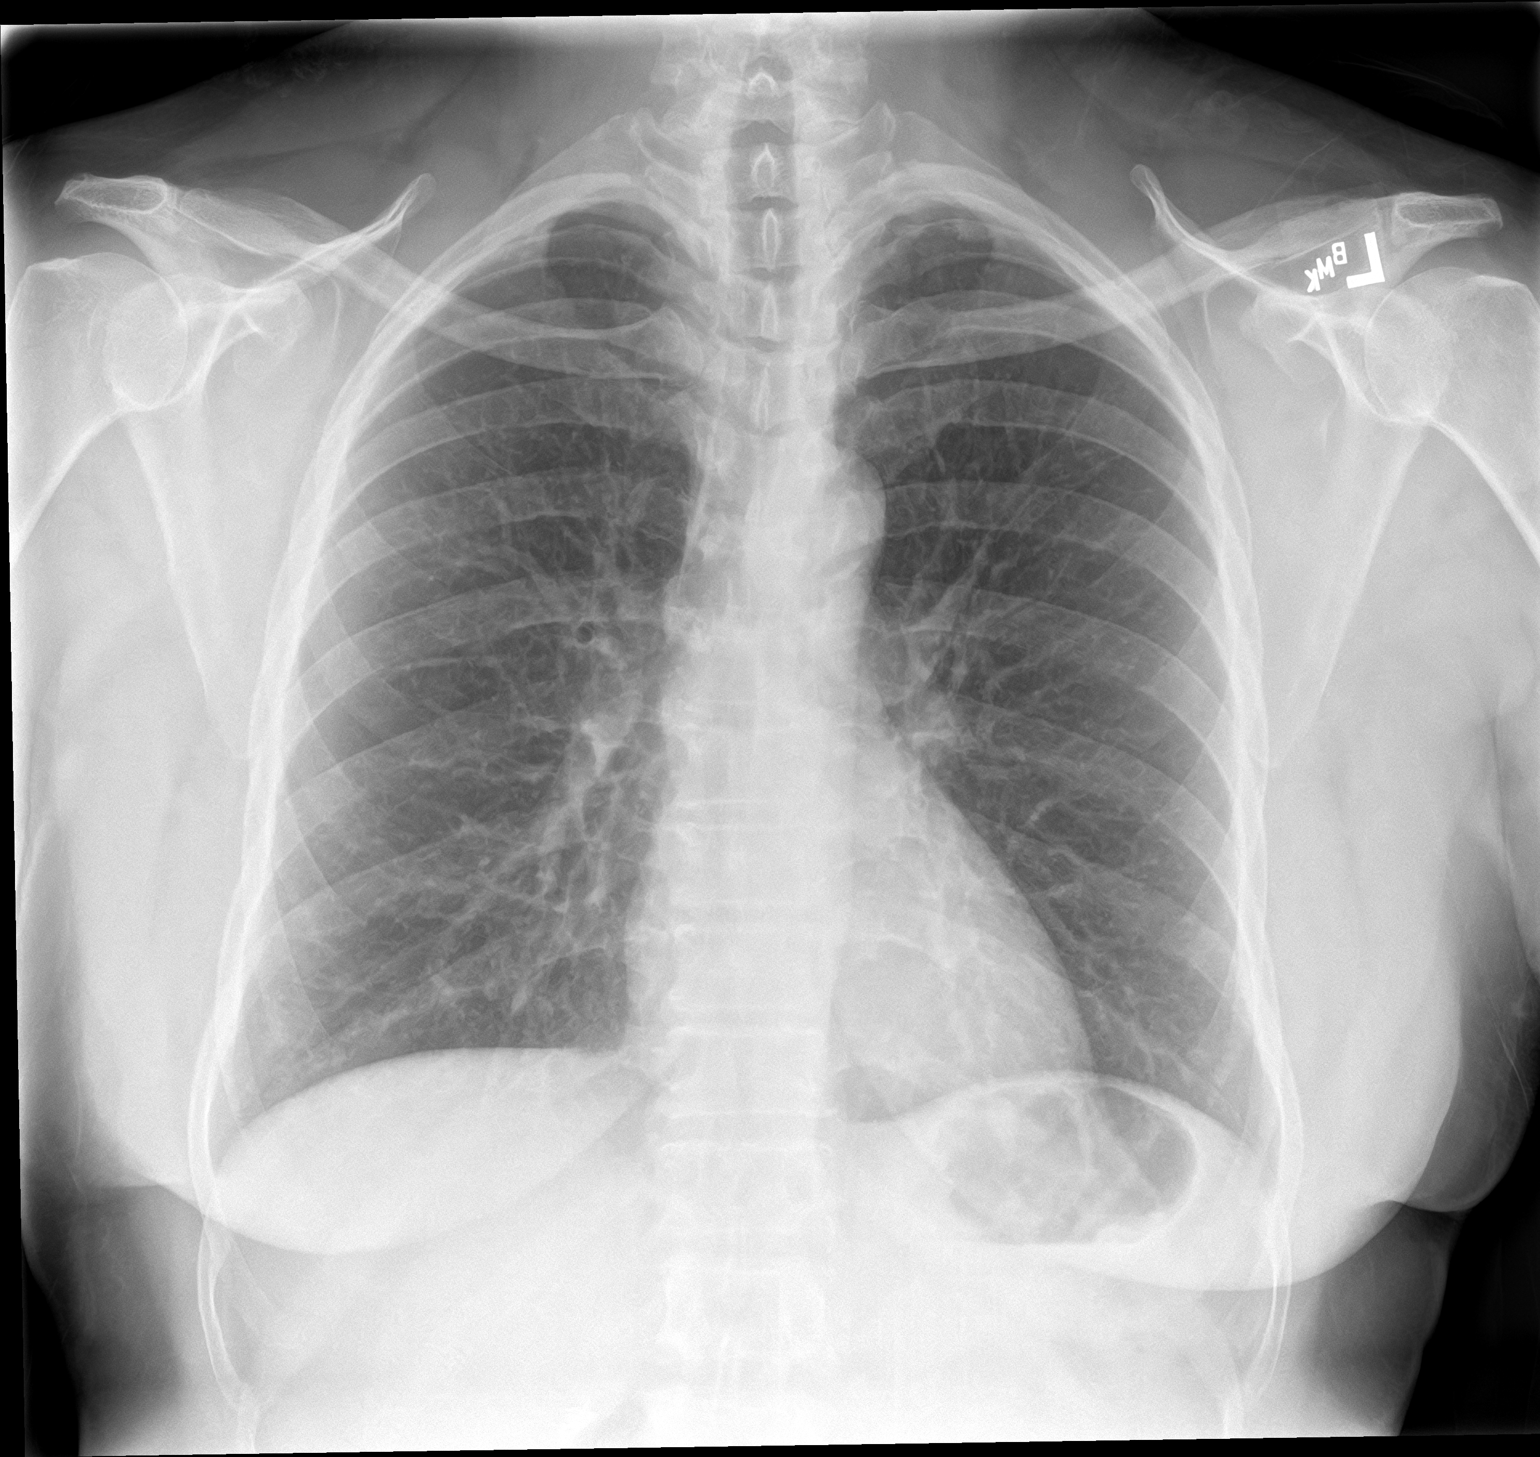

[chest lat]
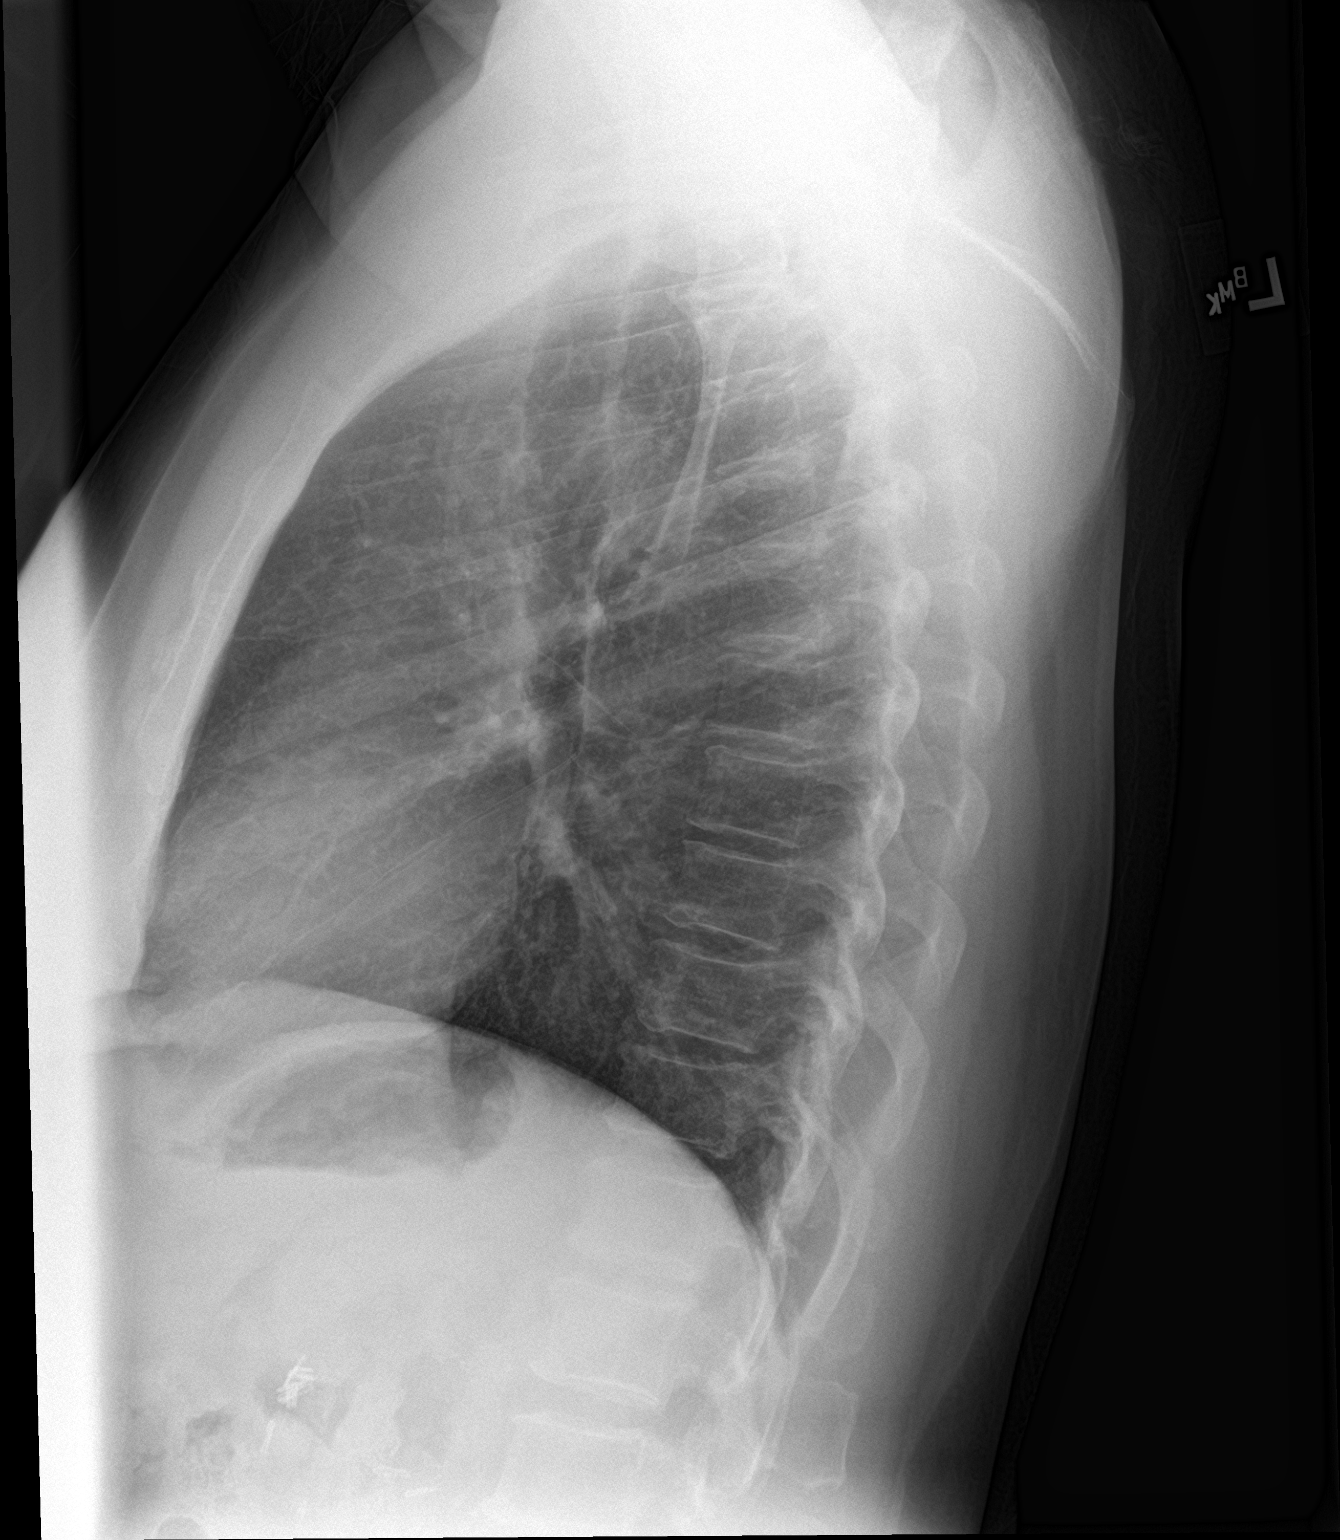

[2 of 2 positions shown; findings below may reference images not displayed]

FINDINGS: The cardiomediastinal contours are normal. Mild biapical
pleuroparenchymal scarring. Pulmonary vasculature is normal. No
consolidation, pleural effusion, or pneumothorax. No acute osseous
abnormalities are seen.
IMPRESSION: No acute abnormality.

## 2019-04-14 IMAGING — CT CT CHEST W/ CM
2 of 3 series · 15 of 36 positions shown, 18 images · IV contrast (APPLIED)
Comparison: Chest x-ray dated November 04, 2017.

CLINICAL DATA: Chest pain and shortness of breath.

EXAM:
CT CHEST WITH CONTRAST
TECHNIQUE: Multidetector CT imaging of the chest was performed during
intravenous contrast administration.
CONTRAST:  75mL U1IYV6-ADD IOPAMIDOL (U1IYV6-ADD) INJECTION 61%

[Series 3: thorax 2.0 i31f 2 · axial · 0.73mm/px · z∈[+1044,+1276]mm · 12 of 138 slices shown, 15 images]
[im 11/138  mediastinal]
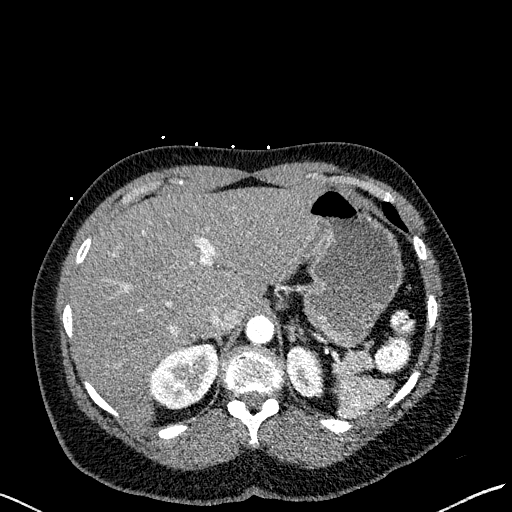
[im 11/138  lung]
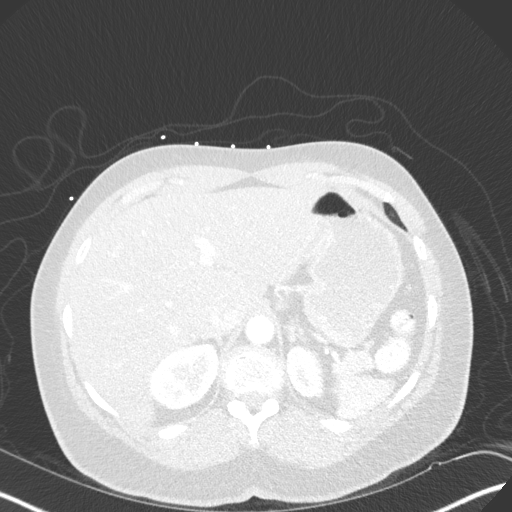
[im 21/138  lung]
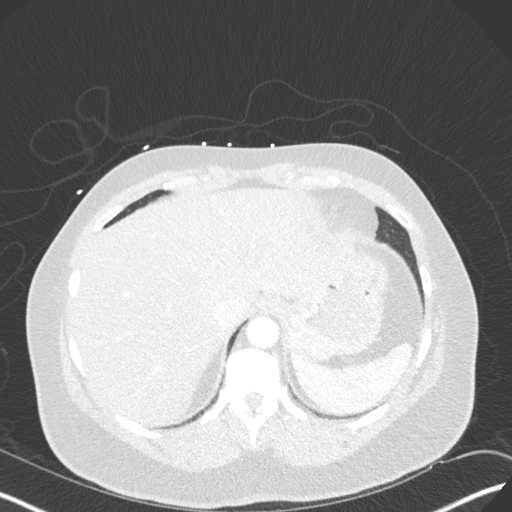
[im 31/138  lung]
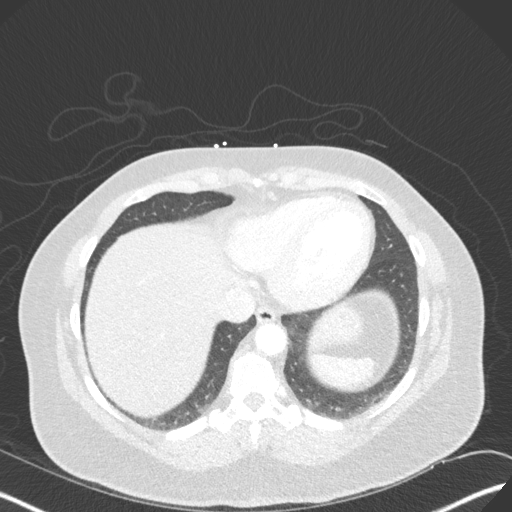
[im 41/138  lung]
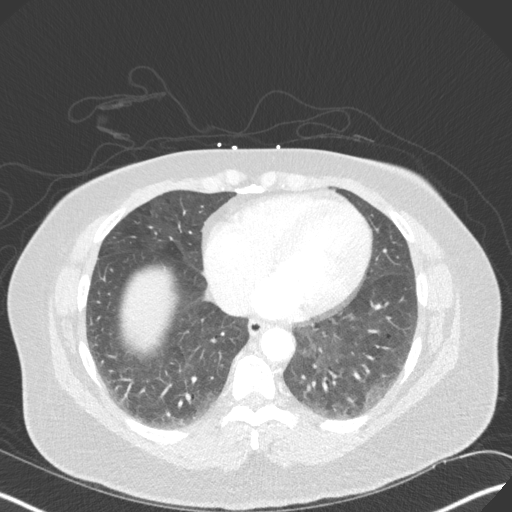
[im 51/138  mediastinal]
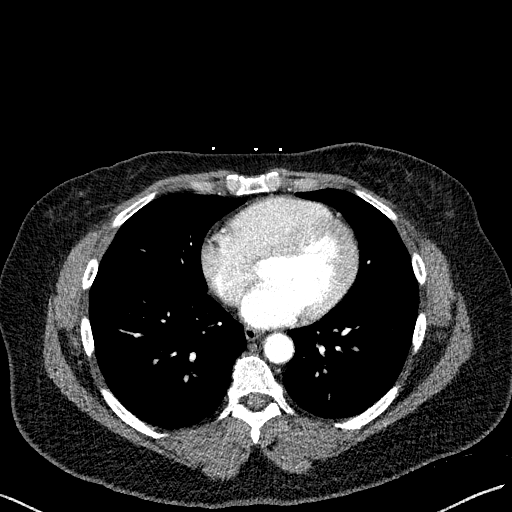
[im 51/138  lung]
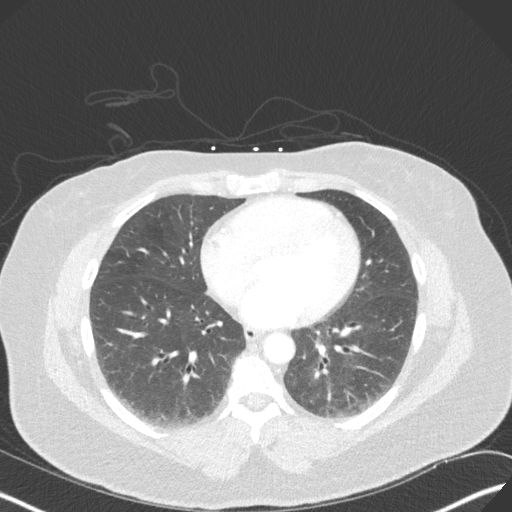
[im 61/138  lung]
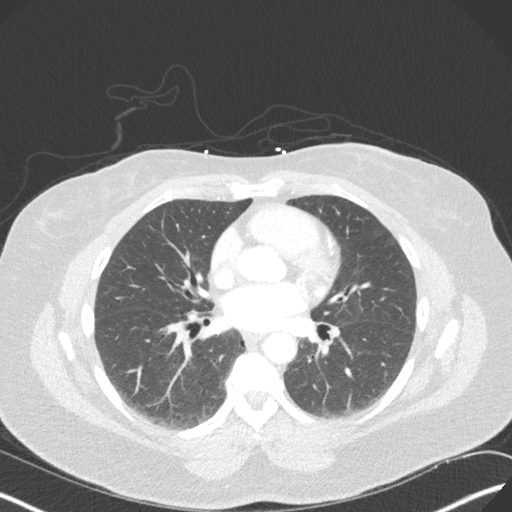
[im 77/138  lung]
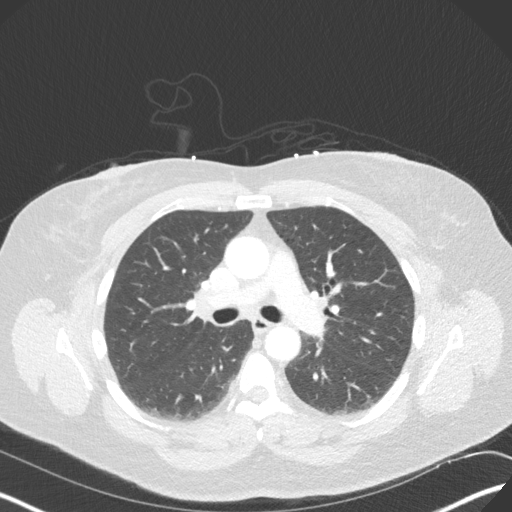
[im 87/138  lung]
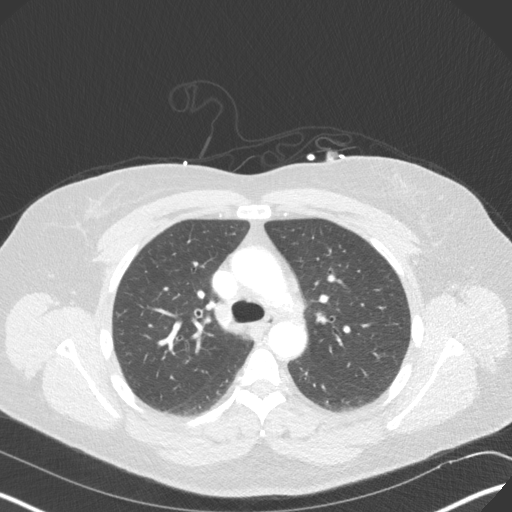
[im 97/138  mediastinal]
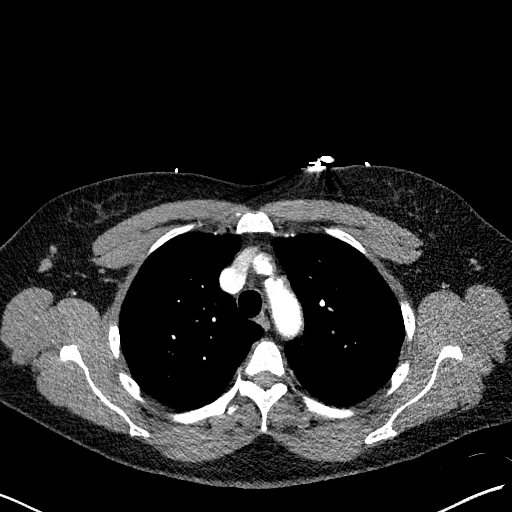
[im 97/138  lung]
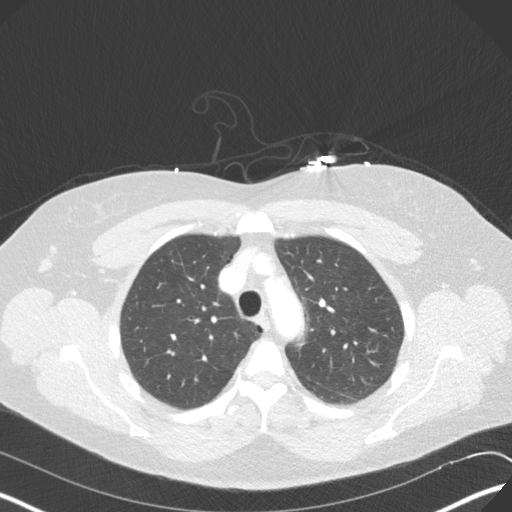
[im 107/138  lung]
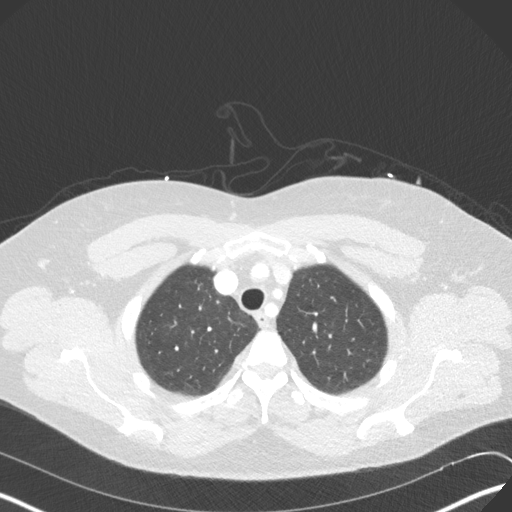
[im 117/138  lung]
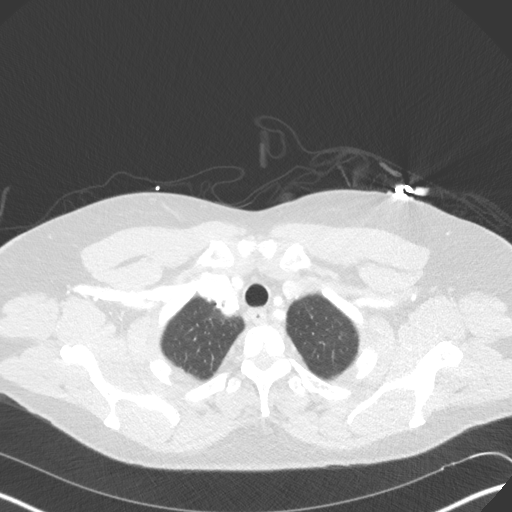
[im 127/138  lung]
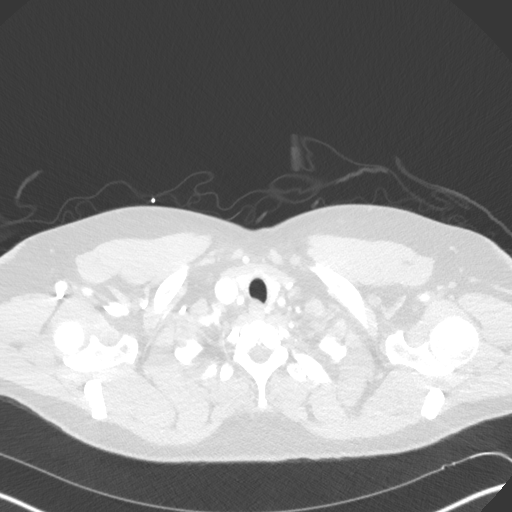

[Series 5: coronal · coronal · 0.57mm/px · 3 of 123 slices shown]
[im 25/123  lung]
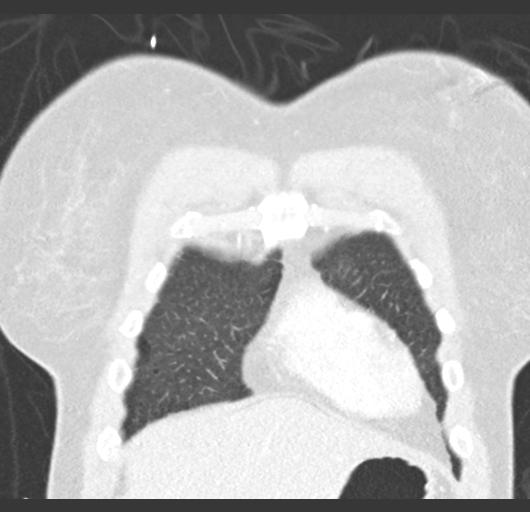
[im 49/123  lung]
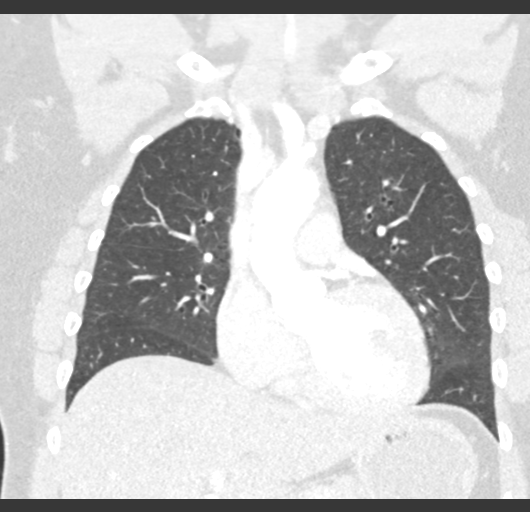
[im 74/123  lung]
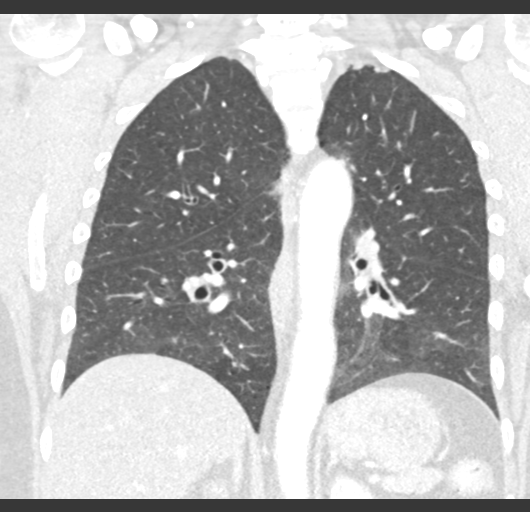

[15 of 36 positions shown; findings below may reference images not displayed]

FINDINGS: Cardiovascular: No significant vascular findings. Normal heart size.
No pericardial effusion. Normal caliber thoracic aorta. Coronary,
aortic arch, and branch vessel atherosclerotic vascular disease.

Mediastinum/Nodes: Prominent right hilar lymph nodes are not
enlarged by CT size criteria. No mediastinal or axillary
lymphadenopathy. The thyroid gland trachea, and esophagus
demonstrate no significant abnormalities.

Lungs/Pleura: Mild centrilobular and paraseptal emphysema. Mild
central peribronchial thickening. There is a 5 mm solid nodule in
the right lung apex (series 7, image 17). Mild dependent bibasilar
atelectasis. Subtle mosaic attenuation in the bilateral lower lobes
and right middle lobe. No focal consolidation, pleural effusion, or
pneumothorax.

Upper Abdomen: No acute abnormality.

Musculoskeletal: No chest wall abnormality. No acute or significant
osseous findings.
IMPRESSION: 1.  No acute intrathoracic process.
2. Subtle mosaic attenuation in the bilateral lower lobes and right
middle lobe may reflect small airways disease.
3. 5 mm pulmonary nodule in the right lung apex. No follow-up needed
if patient is low-risk. Non-contrast chest CT can be considered in
12 months if patient is high-risk. This recommendation follows the
consensus statement: Guidelines for Management of Incidental
Pulmonary Nodules Detected on CT Images: From the [HOSPITAL]
4.  Emphysema (MTQNP-L9S.4).
5.  Aortic atherosclerosis (MTQNP-WE6.6).

## 2019-04-15 NOTE — Telephone Encounter (Signed)
-----   Message from Blain Pais sent at 04/15/2019  2:38 PM EDT ----- Regarding: FW: Lab work needed in June 2020 Called patient to inform we are not doing labs in office. Please place order for [facility] ----- Message ----- From: Ace Gins Sent: 02/23/2019  11:51 AM EDT To: Rebeca Alert Burl Scheduling Subject: Lab work needed in June 2020                   Patient needs, per 02/22/2019 checkout, fasting lipid, CMET and CBC lab work in 2 to 3 months.

## 2019-04-15 NOTE — Telephone Encounter (Signed)
CMET/ lipid/ CBC orders placed for [facility].   Is she already aware where she needs to go?

## 2019-05-14 NOTE — Telephone Encounter (Signed)
lmov to remind patient of [facility] labs

## 2019-05-17 ENCOUNTER — Telehealth: Payer: Self-pay | Admitting: *Deleted

## 2019-05-17 NOTE — Telephone Encounter (Signed)
-----   Message from Emily Filbert, RN sent at 02/22/2019  3:27 PM EDT ----- Per checkout 4/27 w/ Dr. Saunders Revel (e-visit).  She needs FASTING labs- cmet/ lipid/ CBC ( late June)- post COVID

## 2019-05-18 NOTE — Telephone Encounter (Signed)
I spoke with the patient. She is aware she is due for FASTING lab work for Dr. Saunders Revel. She is agreeable with going to the [place] at [facility] for a lab draw some time this week. She is aware no food/ drink for 8 hours prior and that she will need to wear a mask into the hospital.   She voices understanding of the above. Lab orders are already in.

## 2019-05-25 ENCOUNTER — Other Ambulatory Visit
Admission: RE | Admit: 2019-05-25 | Discharge: 2019-05-25 | Disposition: A | Discharge status: Home / Self Care | Payer: Medicaid Other | Source: Ambulatory Visit | Attending: Internal Medicine | Admitting: Internal Medicine

## 2019-05-25 DIAGNOSIS — R0602 Shortness of breath: Secondary | ICD-10-CM

## 2019-05-25 DIAGNOSIS — I2510 Atherosclerotic heart disease of native coronary artery without angina pectoris: Secondary | ICD-10-CM | POA: Insufficient documentation

## 2019-05-25 DIAGNOSIS — E785 Hyperlipidemia, unspecified: Secondary | ICD-10-CM

## 2019-05-25 LAB — CBC WITH DIFFERENTIAL/PLATELET
Abs Immature Granulocytes: 0.04 10*3/uL (ref 0.00–0.07)
Basophils Absolute: 0 10*3/uL (ref 0.0–0.1)
Basophils Relative: 0 %
Eosinophils Absolute: 0.1 10*3/uL (ref 0.0–0.5)
Eosinophils Relative: 1 %
HCT: 35.3 % — ABNORMAL LOW (ref 36.0–46.0)
Hemoglobin: 11.2 g/dL — ABNORMAL LOW (ref 12.0–15.0)
Immature Granulocytes: 0 %
Lymphocytes Relative: 37 %
Lymphs Abs: 3.9 10*3/uL (ref 0.7–4.0)
MCH: 29.8 pg (ref 26.0–34.0)
MCHC: 31.7 g/dL (ref 30.0–36.0)
MCV: 93.9 fL (ref 80.0–100.0)
Monocytes Absolute: 0.7 10*3/uL (ref 0.1–1.0)
Monocytes Relative: 7 %
Neutro Abs: 5.5 10*3/uL (ref 1.7–7.7)
Neutrophils Relative %: 55 %
Platelets: 337 10*3/uL (ref 150–400)
RBC: 3.76 MIL/uL — ABNORMAL LOW (ref 3.87–5.11)
RDW: 14.7 % (ref 11.5–15.5)
WBC: 10.3 10*3/uL (ref 4.0–10.5)
nRBC: 0 % (ref 0.0–0.2)

## 2019-05-25 LAB — COMPREHENSIVE METABOLIC PANEL
ALT: 11 U/L (ref 0–44)
AST: 16 U/L (ref 15–41)
Albumin: 3.7 g/dL (ref 3.5–5.0)
Alkaline Phosphatase: 80 U/L (ref 38–126)
Anion gap: 7 (ref 5–15)
BUN: 11 mg/dL (ref 6–20)
CO2: 24 mmol/L (ref 22–32)
Calcium: 8.7 mg/dL — ABNORMAL LOW (ref 8.9–10.3)
Chloride: 107 mmol/L (ref 98–111)
Creatinine, Ser: 0.77 mg/dL (ref 0.44–1.00)
GFR calc Af Amer: >60 mL/min (ref >60)
GFR calc non Af Amer: >60 mL/min (ref >60)
Glucose, Bld: 214 mg/dL — ABNORMAL HIGH (ref 70–99)
Potassium: 3.7 mmol/L (ref 3.5–5.1)
Sodium: 138 mmol/L (ref 135–145)
Total Bilirubin: 0.5 mg/dL (ref 0.3–1.2)
Total Protein: 6.9 g/dL (ref 6.5–8.1)

## 2019-05-25 LAB — LIPID PANEL
Cholesterol: 168 mg/dL (ref 0–200)
HDL: 28 mg/dL — ABNORMAL LOW (ref >40)
LDL Cholesterol: 115 mg/dL — ABNORMAL HIGH (ref 0–99)
Total CHOL/HDL Ratio: 6 RATIO
Triglycerides: 124 mg/dL (ref <150)
VLDL: 25 mg/dL (ref 0–40)

## 2019-05-26 ENCOUNTER — Telehealth: Payer: Self-pay | Admitting: *Deleted

## 2019-05-26 DIAGNOSIS — E785 Hyperlipidemia, unspecified: Secondary | ICD-10-CM

## 2019-05-26 DIAGNOSIS — Z79899 Other long term (current) drug therapy: Secondary | ICD-10-CM

## 2019-05-26 MED ORDER — EZETIMIBE 10 MG PO TABS: 10.0000 mg | ORAL_TABLET | Freq: Every day | ORAL | 3 refills | Status: DC

## 2019-05-26 NOTE — Telephone Encounter (Signed)
Results called to pt. Pt verbalized understanding. She is aware to start ezetimibe 10 mg daily, and go to the [facility] in 3 months (around the first week of November) for fasting lab work. Rx sent to pharmacy and lab orders entered.

## 2019-05-26 NOTE — Telephone Encounter (Signed)
-----   Message from Nelva Bush, MD sent at 05/25/2019  4:56 PM EDT ----- Please let Caitlyn Herrera know that her blood counts, kidney function, and electrolytes are stable.  Her cholesterol has worsened, with LDL now up to 112 (goal < 70).  Please confirm that she has been taking atorvastatin 80 mg daily.  If so, I recommend that we add ezetimibe 10 mg daily with repeat lipid panel and ALT in ~3 months.

## 2019-09-02 ENCOUNTER — Other Ambulatory Visit: Payer: Self-pay | Admitting: Internal Medicine

## 2019-09-02 DIAGNOSIS — E785 Hyperlipidemia, unspecified: Secondary | ICD-10-CM

## 2019-09-02 MED ORDER — ATORVASTATIN CALCIUM 80 MG PO TABS: 80.0000 mg | ORAL_TABLET | Freq: Every day | ORAL | 0 refills | Status: DC

## 2019-09-02 NOTE — Telephone Encounter (Signed)
°*  STAT* If patient is at the pharmacy, call can be transferred to refill team.   1. Which medications need to be refilled? (please list name of each medication and dose if known) atorvastatin (LIPITOR) 80 MG 1 tablet daily 6PM  2. Which pharmacy/location (including street and city if local pharmacy) is medication to be sent to? [facility] on [address]   3. Do they need a 30 day or 90 day supply? 90 day   Patient scheduled an appt on 11/12 with dr End

## 2019-09-09 ENCOUNTER — Other Ambulatory Visit: Payer: Self-pay

## 2019-09-09 ENCOUNTER — Encounter: Payer: Self-pay | Admitting: Internal Medicine

## 2019-09-09 ENCOUNTER — Telehealth (INDEPENDENT_AMBULATORY_CARE_PROVIDER_SITE_OTHER): Payer: Medicaid Other | Admitting: Internal Medicine

## 2019-09-09 ENCOUNTER — Telehealth: Payer: Self-pay | Admitting: *Deleted

## 2019-09-09 VITALS — BP 148/76 | Ht 66.0 in | Wt 180.0 lb

## 2019-09-09 DIAGNOSIS — R05 Cough: Secondary | ICD-10-CM | POA: Diagnosis not present

## 2019-09-09 DIAGNOSIS — E785 Hyperlipidemia, unspecified: Secondary | ICD-10-CM

## 2019-09-09 DIAGNOSIS — R053 Chronic cough: Secondary | ICD-10-CM

## 2019-09-09 DIAGNOSIS — J449 Chronic obstructive pulmonary disease, unspecified: Secondary | ICD-10-CM

## 2019-09-09 DIAGNOSIS — Z79899 Other long term (current) drug therapy: Secondary | ICD-10-CM

## 2019-09-09 DIAGNOSIS — I10 Essential (primary) hypertension: Secondary | ICD-10-CM

## 2019-09-09 DIAGNOSIS — I2510 Atherosclerotic heart disease of native coronary artery without angina pectoris: Secondary | ICD-10-CM | POA: Diagnosis not present

## 2019-09-09 MED ORDER — HYDROCHLOROTHIAZIDE 25 MG PO TABS: 25.0000 mg | ORAL_TABLET | Freq: Every day | ORAL | 1 refills | Status: DC

## 2019-09-09 NOTE — Progress Notes (Signed)
Virtual Visit via Telephone Note   This visit type was conducted due to national recommendations for restrictions regarding the COVID-19 Pandemic (e.g. social distancing) in an effort to limit this patient's exposure and mitigate transmission in our community.  Due to her co-morbid illnesses, this patient is at least at moderate risk for complications without adequate follow up.  This format is felt to be most appropriate for this patient at this time.  The patient did not have access to video technology/had technical difficulties with video requiring transitioning to audio format only (telephone).  All issues noted in this document were discussed and addressed.  No physical exam could be performed with this format.  Please refer to the patient's chart for her  consent to telehealth for [facility].   Date:  09/09/2019   ID:  Caitlyn Herrera, DOB [birth year], MRN [id number]  Patient Location: Home Provider Location: Home  PCP:  [facility]  Cardiologist:  Yvonne Kendallhristopher Charnice Zwilling, MD  Electrophysiologist:  None   Evaluation Performed:  Follow-Up Visit  Chief Complaint:  Cough and chest congestion  History of Present Illness:    Caitlyn Herrera Chaplin is a 58 y.o. female with history of coronary artery disease status post CABG (10/2017), hypertension, hyperlipidemia, and type 2 diabetes mellitus.    Last spoke with her via virtual visit in late April.  At that time she was doing well other than intermittent productive cough that had been present since her CABG.  She had been placed on Protonix by ENT, but she did not tolerate this.  Chronic exertional dyspnea was stable.  We agreed to stop clopidogrel.  She was also taking carvedilol and metoprolol at the same time; we therefore agreed to discontinue metoprolol and increase carvedilol to 12.5 mg twice daily.  Today, [name] reports doing well from a heart standpoint.  She denies chest pain, palpitations, and  lightheadedness.  She continues to have a frequent cough with intermittent scant sputum production.  When she has a prolonged coughing episode, she feels a little short of breath.  Otherwise, she has not had any dyspnea.  She was scheduled for EGD in June, but this was canceled as she did not have a preprocedure COVID-19 test.  She has not rescheduled this study.  She reports being compliant with her medications, which she is tolerating well.  She notes that most home blood pressure readings are similar to today's.  The patient does not have symptoms concerning for COVID-19 infection (fever, chills, cough, or new shortness of breath).    Past Medical History:  Diagnosis Date  . Abdominal aortic ectasia (HCC)    a. 11/2017 CTA chest/abd/pelvis: 2.5 cm abd ao ectasia -->rec f/u u/s in 5 yrs.  [name]. CAD (coronary artery disease)    a. 11/04/17 Cath: Native multivessel dzs-->CABG x 3 (LIMA->LAD, VG->OM2, VG->RPDA; b. 11/2017 MV: mid antlat/apical isch; c. 11/2017 Cath: LM 30d, LAD 5435m, LCX 80p/m, OM1 90, OM2 80 (@ anastamosis of graft), RCA 80p, 16340m, 90d, LIMA->LAD nl, VG->OM2 nl, VG->RPDA nl-->Med Rx.  . Carotid arterial disease (HCC)    a. 10/2017 Carotid U/S: 1-30% bilat ICA stenosis.  [name]. History of echocardiogram    a. 11/04/2017 Echo: EF of 65-70%, no RWMA, nl LV diastolic fxn, nl RV size/fxn, mild TR; b. 03/2018 Echo: EF 60-65%, mild LVH, no rwma, mild MR. Mildy reduced RV fxn. Mild TR.  [name]. History of Tobacco abuse   . Hyperlipidemia   . Hypertension   .  Left renal artery stenosis (HCC)    a. 11/2017 CTA Chest/Abd/Pelvis: 50-70% L RA stenosis.  . Palpitations    a. 02/2018 Event Monitor: wore for 2 days, 21 hrs - rare PAC's/PVC's. Avg HR 66 (42-103); b. 04/2018 Event Monitor: Wore for 14 days. RSR, 67 (49-111). No significant arrhythmias.  . Persistent cough   . Pulmonary nodule, right    a. 10/2017 CT Chest: 5mm pulm nodule in R lung apex - rec f/u w/ non-contrast chest CT in 1 year.  . Type II diabetes  mellitus (HCC)    a. 10/2017 A1c 6.6.   Past Surgical History:  Procedure Laterality Date  . BREAST BIOPSY Right 06/23/2018   US guided biopsy - awaiting pathology  . BREAST CYST EXCISION Right   . CARDIAC CATHETERIZATION    . CORONARY ARTERY BYPASS GRAFT N/A 11/12/2017   Procedure: CORONARY ARTERY BYPASS GRAFTING (CABG) x Three , using left internal mammary artery and right leg greater saphenous vein harvested endoscopically;  Surgeon: Kerin Perna, MD;  Location: [facility] OR;  Service: Open Heart Surgery;  Laterality: N/A;  . DILATION AND CURETTAGE OF UTERUS    . LAPAROSCOPIC CHOLECYSTECTOMY    . LEFT HEART CATH AND CORONARY ANGIOGRAPHY N/A 11/04/2017   Procedure: LEFT HEART CATH AND CORONARY ANGIOGRAPHY;  Surgeon: Yvonne Kendall, MD;  Location: [facility] INVASIVE CV LAB;  Service: Cardiovascular;  Laterality: N/A;  . LEFT HEART CATH AND CORS/GRAFTS ANGIOGRAPHY N/A 12/09/2017   Procedure: LEFT HEART CATH AND CORS/GRAFTS ANGIOGRAPHY;  Surgeon: [name], Peter M, MD;  [facility] CV LAB;  Service: Cardiovascular;  Laterality: N/A;  . TEE WITHOUT CARDIOVERSION N/A 11/12/2017   Procedure: TRANSESOPHAGEAL ECHOCARDIOGRAM (TEE);  Surgeon: Donata Clay, Theron Arista, MD;  Location: [facility] OR;  Service: Open Heart Surgery;  Laterality: N/A;     Current Meds  Medication Sig  . ADVAIR DISKUS 500-50 MCG/DOSE AEPB USE 1 INHALATION PO BID  . albuterol (PROVENTIL HFA;VENTOLIN HFA) 108 (90 Base) MCG/ACT inhaler Inhale 2 puffs into the lungs every 6 (six) hours as needed for wheezing or shortness of breath.  [name] aspirin EC 81 MG tablet Take 1 tablet (81 mg total) by mouth daily.  [name] atorvastatin (LIPITOR) 80 MG tablet Take 1 tablet (80 mg total) by mouth daily at 6 PM.  . azelastine (ASTELIN) 0.1 % nasal spray INSTILL 1 SPRAY INTO EACH NOSTRIL PRN FOR ALLERGIES AND DRAINAGE  . benzonatate (TESSALON) 100 MG capsule Take 100 mg by mouth as needed.   . carvedilol (COREG) 12.5 MG tablet Take 1 tablet (12.5 mg total) by mouth  2 (two) times daily.  . citalopram (CELEXA) 40 MG tablet Take 1.5 mg by mouth daily.   . cyclobenzaprine (FLEXERIL) 10 MG tablet TK 1 T PO TID  . ezetimibe (ZETIA) 10 MG tablet Take 1 tablet (10 mg total) by mouth daily.  . fluticasone (FLONASE) 50 MCG/ACT nasal spray SHAKE LQ AND U 2 SPRAYS IEN QD  . fluticasone furoate-vilanterol (BREO ELLIPTA) 100-25 MCG/INH AEPB Inhale 1 puff into the lungs daily.  [name] lidocaine (XYLOCAINE) 5 % ointment Apply 1 application topically daily as needed.  [name] losartan (COZAAR) 100 MG tablet Take 100 mg by mouth daily.  . mometasone-formoterol (DULERA) 200-5 MCG/ACT AERO Inhale 2 puffs into the lungs 2 (two) times daily.  . nicotine (NICODERM CQ - DOSED IN MG/24 HOURS) 21 mg/24hr patch Place 21 mg onto the skin as directed.  [name] omeprazole (PRILOSEC) 40 MG capsule TAKE 1 CAPSULE BY MOUTH EVERY DAY BEFORE  BREAKFAST  . ondansetron (ZOFRAN) 4 MG tablet Take 1 tablet (4 mg total) by mouth every 8 (eight) hours as needed for nausea or vomiting.  [name] oxybutynin (DITROPAN-XL) 5 MG 24 hr tablet TK 1T PO QD FOR INCONTINENCE  . traMADol (ULTRAM) 50 MG tablet TK 1 T PO Q 6 H     Allergies:   Patient has no known allergies.   Social History   Tobacco Use  . Smoking status: Former Smoker    Packs/day: 1.00    Years: 42.00    Pack years: 42.00    Types: Cigarettes    Quit date: 11/01/2017    Years since quitting: 1.8  . Smokeless tobacco: Never Used  . Tobacco comment: 11/05/2017 "stopped smoking 11/01/2017"  Substance Use Topics  . Alcohol use: No  . Drug use: No     Family Hx: The patient's family history includes Heart Problems in her mother; Hypertension in her mother. There is no history of Breast cancer.  ROS:   Please see the history of present illness.   All other systems reviewed and are negative.   Prior CV studies:   The following studies were reviewed today:  14-day event monitor (05/12/2018): Sinus rhythm without significant arrhythmias. Study significant  limited by short monitoring period (only 20% of monitoring period yielded diagnostic tracings).  Echocardiogram (04/13/2018): Upper normal LV size with mild LVH.  LVEF 60-65% with normal wall motion and diastolic function.  Mild mitral regurgitation.  Normal RV size with mildly reduced contraction.  Mild tricuspid regurgitation.  Labs/Other Tests and Data Reviewed:    EKG:  No ECG reviewed.  Recent Labs: 05/25/2019: ALT 11; BUN 11; Creatinine, Ser 0.77; Hemoglobin 11.2; Platelets 337; Potassium 3.7; Sodium 138   Recent Lipid Panel Lab Results  Component Value Date/Time   CHOL 168 05/25/2019 11:09 AM   CHOL 103 12/17/2017 10:15 AM   TRIG 124 05/25/2019 11:09 AM   HDL 28 (L) 05/25/2019 11:09 AM   HDL 31 (L) 12/17/2017 10:15 AM   CHOLHDL 6.0 05/25/2019 11:09 AM   LDLCALC 115 (H) 05/25/2019 11:09 AM   LDLCALC 56 12/17/2017 10:15 AM    Wt Readings from Last 3 Encounters:  09/09/19 180 lb (81.6 kg)  02/22/19 200 lb (90.7 kg)  10/05/18 194 lb 8 oz (88.2 kg)     Objective:    Vital Signs:  BP (!) 148/76 (BP Location: Left Arm, Patient Position: Sitting, Cuff Size: Normal)   Ht 5\' 6"  (1.676 m)   Wt 180 lb (81.6 kg)   BMI 29.05 kg/m    VITAL SIGNS:  reviewed  ASSESSMENT & PLAN:    Coronary artery disease: No symptoms to suggest worsening coronary insufficiency status post CABG in 11/2017.  We will continue her current medication regimen for secondary prevention.  Hyperlipidemia: Most recent lipid panel in July was notable for elevated LDL of 115 in spite of taking atorvastatin 80 mg daily.  Ezetimibe was added at that time.  We will arrange for a repeat fasting lipid panel and CMP in about a month.  If LDL remains greater than 70, we will need to consider addition of a PCSK9 inhibitor.  Hypertension: Blood pressure suboptimally controlled.  I will start HCTZ 25 mg daily.  Current doses of losartan and carvedilol should be continued.  We will plan for a CMP in 1 month.  Cough  and COPD: Patient has chronic cough with intermittent chest congestion and sputum production.  She is on multiple bronchodilators for  presumed COPD but has never seen a pulmonologist.  I will refer her to pulmonary for further evaluation/management.  She should touch base with her gastroenterologist as well regarding rescheduling EGD that was canceled and June.  Time:   Today, I have spent 20 minutes with the patient with telehealth technology discussing the above problems.     Medication Adjustments/Labs and Tests Ordered: Current medicines are reviewed at length with the patient today.  Concerns regarding medicines are outlined above.   Tests Ordered: Orders Placed This Encounter  Procedures  . Comprehensive Metabolic Panel (CMET)  . Lipid Profile  . Ambulatory referral to Pulmonology    Medication Changes: Meds ordered this encounter  Medications  . hydrochlorothiazide (HYDRODIURIL) 25 MG tablet    Sig: Take 1 tablet (25 mg total) by mouth daily.    Dispense:  90 tablet    Refill:  1    Follow Up:  In Person in 3 month(s)  Signed, Yvonne Kendall, MD  09/09/2019 10:45 AM    [facility]

## 2019-09-09 NOTE — Telephone Encounter (Signed)
Patient had virtual visit with Dr End today. Attempted to reach patient to over AVS. No answer. Left message to call back.

## 2019-09-09 NOTE — Telephone Encounter (Signed)
Patient calling back. We went over AVS from virtual visit today.  Patient verbalized understanding of instructions and plan of care. AVS mailed to patient.

## 2019-09-09 NOTE — Patient Instructions (Addendum)
Medication Instructions:  Your physician has recommended you make the following change in your medication:  1- START HCTZ (Hydrochlorothiazide) 25 mg (1 tablet) by mouth once a day.  *If you need a refill on your cardiac medications before your next appointment, please call your pharmacy*  Lab Work: Your physician recommends that you return for lab work in: 1 month (around 10/09/2019) at the [place] check LIPID (cholesterol) and CMP. - - You will need to be fasting. Please do not have anything to eat or drink after midnight the morning you have the lab work. You may only have water or black coffee with no cream or sugar. - Please go to the [facility]. You will check in at the front desk to the right as you walk into the atrium. Valet Parking is offered if needed. - No appointment needed. You may go any day between 7 am and 6 pm.   If you have labs (blood work) drawn today and your tests are completely normal, you will receive your results only by: [name] MyChart Message (if you have MyChart) OR . A paper copy in the mail If you have any lab test that is abnormal or we need to change your treatment, we will call you to review the results.  Testing/Procedures: none  Follow-Up: Referral to pulmonology - [place] should call you to schedule. If you do not receive a call, please call [phone] .  At [facility], you and your health needs are our priority.  As part of our continuing mission to provide you with exceptional heart care, we have created designated Provider Care Teams.  These Care Teams include your primary Cardiologist (physician) and Advanced Practice Providers (APPs -  Physician Assistants and Nurse Practitioners) who all work together to provide you with the care you need, when you need it.  Your next appointment:   3 months  The format for your next appointment:   In Person  Provider:    You may see Nelva Bush, MD  or one of the following Advanced Practice Providers on your designated Care Team:    Murray Hodgkins, NP  Christell Faith, PA-C  Marrianne Mood, PA-C

## 2019-10-27 ENCOUNTER — Other Ambulatory Visit
Admission: RE | Admit: 2019-10-27 | Discharge: 2019-10-27 | Disposition: A | Discharge status: Home / Self Care | Payer: Medicaid Other | Source: Ambulatory Visit | Attending: Internal Medicine | Admitting: Internal Medicine

## 2019-10-27 DIAGNOSIS — Z79899 Other long term (current) drug therapy: Secondary | ICD-10-CM | POA: Diagnosis not present

## 2019-10-27 DIAGNOSIS — I2510 Atherosclerotic heart disease of native coronary artery without angina pectoris: Secondary | ICD-10-CM | POA: Diagnosis present

## 2019-10-27 LAB — LIPID PANEL
Cholesterol: 133 mg/dL (ref 0–200)
HDL: 26 mg/dL — ABNORMAL LOW (ref >40)
LDL Cholesterol: 82 mg/dL (ref 0–99)
Total CHOL/HDL Ratio: 5.1 RATIO
Triglycerides: 126 mg/dL (ref <150)
VLDL: 25 mg/dL (ref 0–40)

## 2019-10-27 LAB — COMPREHENSIVE METABOLIC PANEL
ALT: 13 U/L (ref 0–44)
AST: 19 U/L (ref 15–41)
Albumin: 3.8 g/dL (ref 3.5–5.0)
Alkaline Phosphatase: 85 U/L (ref 38–126)
Anion gap: 12 (ref 5–15)
BUN: 11 mg/dL (ref 6–20)
CO2: 26 mmol/L (ref 22–32)
Calcium: 9.3 mg/dL (ref 8.9–10.3)
Chloride: 100 mmol/L (ref 98–111)
Creatinine, Ser: 1.2 mg/dL — ABNORMAL HIGH (ref 0.44–1.00)
GFR calc Af Amer: 57 mL/min — ABNORMAL LOW (ref >60)
GFR calc non Af Amer: 49 mL/min — ABNORMAL LOW (ref >60)
Glucose, Bld: 190 mg/dL — ABNORMAL HIGH (ref 70–99)
Potassium: 3.7 mmol/L (ref 3.5–5.1)
Sodium: 138 mmol/L (ref 135–145)
Total Bilirubin: 0.8 mg/dL (ref 0.3–1.2)
Total Protein: 7.9 g/dL (ref 6.5–8.1)

## 2019-10-28 ENCOUNTER — Telehealth: Payer: Self-pay | Admitting: *Deleted

## 2019-10-28 DIAGNOSIS — E785 Hyperlipidemia, unspecified: Secondary | ICD-10-CM

## 2019-10-28 NOTE — Telephone Encounter (Signed)
No answer. Left message to call back.   

## 2019-10-28 NOTE — Telephone Encounter (Signed)
-----   Message from Nelva Bush, MD sent at 10/27/2019  9:58 PM EST ----- Please let Ms. Glade know that her cholesterol has improved, though her LDL remains above our goal of less than 70.  I recommend that we arrange for her to be seen by one of the pharmacists in the lipid clinic to discuss initiation of a PCSK9 inhibitor or bempedoic acid.  Her creatine is also slightly above baseline.  I encourage Ms. Neer to stay well-hydrated.  No medication changes today.

## 2019-11-02 NOTE — Telephone Encounter (Signed)
Results called to pt. Pt verbalized understanding. Patient agreeable to lipid clinic PharmD referral. She is aware someone will contact her.  Referral placed and message sent to Caitlyn Herrera pool.

## 2019-11-09 ENCOUNTER — Other Ambulatory Visit: Payer: Self-pay

## 2019-11-09 ENCOUNTER — Encounter: Payer: Self-pay | Admitting: Pulmonary Disease

## 2019-11-09 ENCOUNTER — Ambulatory Visit: Payer: Medicaid Other | Admitting: Pulmonary Disease

## 2019-11-09 VITALS — BP 120/68 | HR 64 | Temp 97.4°F | Ht 66.0 in | Wt 197.4 lb

## 2019-11-09 DIAGNOSIS — K219 Gastro-esophageal reflux disease without esophagitis: Secondary | ICD-10-CM

## 2019-11-09 MED ORDER — TRELEGY ELLIPTA 100-62.5-25 MCG/INH IN AEPB: 1.0000 | INHALATION_SPRAY | Freq: Every day | RESPIRATORY_TRACT | 5 refills | Status: DC

## 2019-11-09 NOTE — Progress Notes (Signed)
 Assessment & Plan:  1. Chronic GERD (Primary) - Ambulatory referral to Gastroenterology   Patient Instructions  1.  I recommend an antireflux pillow or a wedge pillow to sleep on at night.  2.  Make sure you do not go to bed for at least 2 to 3 hours after your last meal of the day.  This is to allow your stomach to empty.  3.  Take your omeprazole  (Prilosec) in the morning before breakfast and take Pepcid  (over-the-counter) 20 mg 1 tablet at bedtime.  We are referring you to gastroenterology (GI) to evaluate your reflux.  4.  We will switch your Breo Ellipta to Trelegy Ellipta  1 inhalation daily.  5.  We will see you in follow-up in 4 to 6 weeks time call sooner should any new difficulties arise.     Please note: late entry documentation due to logistical difficulties during COVID-19 pandemic. This note is filed for information purposes only, and is not intended to be used for billing, nor does it represent the full scope/nature of the visit in question. Please see any associated scanned media linked to date of encounter for additional pertinent information.  Subjective:    HPI: Caitlyn Herrera is a 59 y.o. female presenting to the pulmonology clinic on 11/09/2019 with report of: Pulmonary Consult (Referred by PCP for cough that she has had since triple bypass on 11/03/2017. Pt denies any sob, wheezing, fever, or chills.)     Outpatient Encounter Medications as of 11/09/2019  Medication Sig   aspirin  EC 81 MG tablet Take 1 tablet (81 mg total) by mouth daily.   azelastine (ASTELIN) 0.1 % nasal spray Place 2 sprays into both nostrils as needed.   cyclobenzaprine (FLEXERIL) 10 MG tablet Take 10 mg by mouth 3 (three) times daily as needed.   fluticasone  (FLONASE) 50 MCG/ACT nasal spray 2 sprays as needed.   omeprazole  (PRILOSEC) 40 MG capsule TAKE 1 CAPSULE BY MOUTH EVERY DAY BEFORE BREAKFAST   [DISCONTINUED] ADVAIR DISKUS 500-50 MCG/DOSE AEPB USE 1 INHALATION PO BID    [DISCONTINUED] albuterol  (PROVENTIL  HFA;VENTOLIN  HFA) 108 (90 Base) MCG/ACT inhaler Inhale 2 puffs into the lungs every 6 (six) hours as needed for wheezing or shortness of breath. (Patient not taking: Reported on 03/16/2024)   [DISCONTINUED] atorvastatin  (LIPITOR ) 80 MG tablet Take 1 tablet (80 mg total) by mouth daily at 6 PM.   [DISCONTINUED] benzonatate  (TESSALON ) 100 MG capsule Take 100 mg by mouth as needed.    [DISCONTINUED] citalopram  (CELEXA ) 40 MG tablet Take 40 mg by mouth daily.    [DISCONTINUED] fluticasone  furoate-vilanterol (BREO ELLIPTA) 100-25 MCG/INH AEPB Inhale 1 puff into the lungs daily.   [DISCONTINUED] hydrochlorothiazide  (HYDRODIURIL ) 25 MG tablet Take 1 tablet (25 mg total) by mouth daily.   [DISCONTINUED] lidocaine  (XYLOCAINE ) 5 % ointment Apply 1 application topically daily as needed.   [DISCONTINUED] losartan  (COZAAR ) 100 MG tablet Take 100 mg by mouth daily.   [DISCONTINUED] mometasone -formoterol  (DULERA ) 200-5 MCG/ACT AERO Inhale 2 puffs into the lungs 2 (two) times daily.   [DISCONTINUED] nicotine  (NICODERM CQ  - DOSED IN MG/24 HOURS) 21 mg/24hr patch Place 21 mg onto the skin as directed.   [DISCONTINUED] ondansetron  (ZOFRAN ) 4 MG tablet Take 1 tablet (4 mg total) by mouth every 8 (eight) hours as needed for nausea or vomiting.   [DISCONTINUED] oxybutynin (DITROPAN-XL) 5 MG 24 hr tablet TK 1T PO QD FOR INCONTINENCE   [DISCONTINUED] traMADol  (ULTRAM ) 50 MG tablet Take 50 mg by mouth every  6 (six) hours as needed.   [DISCONTINUED] carvedilol  (COREG ) 12.5 MG tablet Take 1 tablet (12.5 mg total) by mouth 2 (two) times daily.   [DISCONTINUED] ezetimibe  (ZETIA ) 10 MG tablet Take 1 tablet (10 mg total) by mouth daily.   [DISCONTINUED] Fluticasone -Umeclidin-Vilant (TRELEGY ELLIPTA ) 100-62.5-25 MCG/INH AEPB Inhale 1 puff into the lungs daily.   No facility-administered encounter medications on file as of 11/09/2019.      Objective:   Vitals:   11/09/19 1341 11/09/19 1345   BP: 120/68 120/68  Pulse: 64 64  Temp: (!) 97.4 F (36.3 C)   Height: 5' 6 (1.676 m)   Weight: 197 lb 6.4 oz (89.5 kg)   SpO2: 100% Comment: on ra 100%  TempSrc: Temporal   BMI (Calculated): 31.88      Physical exam documentation is limited by delayed entry of information.

## 2019-11-09 NOTE — Patient Instructions (Signed)
1.  I recommend an antireflux pillow or a wedge pillow to sleep on at night.  2.  Make sure you do not go to bed for at least 2 to 3 hours after your last meal of the day.  This is to allow your stomach to empty.  3.  Take your omeprazole (Prilosec) in the morning before breakfast and take Pepcid (over-the-counter) 20 mg 1 tablet at bedtime.  We are referring you to gastroenterology (GI) to evaluate your reflux.  4.  We will switch your Breo Ellipta to Trelegy Ellipta 1 inhalation daily.  5.  We will see you in follow-up in 4 to 6 weeks time call sooner should any new difficulties arise.

## 2019-11-22 ENCOUNTER — Other Ambulatory Visit: Payer: Self-pay | Admitting: Physician Assistant

## 2019-11-22 DIAGNOSIS — Z1231 Encounter for screening mammogram for malignant neoplasm of breast: Secondary | ICD-10-CM

## 2019-11-23 ENCOUNTER — Telehealth: Payer: Self-pay

## 2019-11-23 NOTE — Telephone Encounter (Signed)
Called [facility] to start an PA for pts trelegy and they mentioned she has to try the preferred medications first. Advair and Symbicort are the two preferred inhalers. Please advise.

## 2019-11-24 NOTE — Telephone Encounter (Signed)
She has already failed Breo Ellipta and [facility] which are in the same class as Advair and Symbicort.  The patient needs triple therapy with LABA/LAMA/ICS.  Recommend Trelegy.

## 2019-11-25 ENCOUNTER — Ambulatory Visit: Payer: Medicaid Other | Admitting: Gastroenterology

## 2019-11-25 NOTE — Telephone Encounter (Signed)
She needs triple therapy for her COPD.  Advair and Symbicort only offered to medications.  Recommend then Advair discuss 250/50 1 inhalation twice a day AND Spiriva HandiHaler 1 capsule inhaled daily.  Her COPD is advanced enough that she needs triple therapy.

## 2019-11-26 MED ORDER — SPIRIVA HANDIHALER 18 MCG IN CAPS: 18.0000 ug | ORAL_CAPSULE | Freq: Every day | RESPIRATORY_TRACT | 2 refills | Status: DC

## 2019-11-26 MED ORDER — FLUTICASONE-SALMETEROL 250-50 MCG/DOSE IN AEPB: 1.0000 | INHALATION_SPRAY | Freq: Two times a day (BID) | RESPIRATORY_TRACT | 3 refills | Status: DC

## 2019-11-26 NOTE — Telephone Encounter (Signed)
Placed orders for RX. Called and made patient aware. She verbalized her understanding. Nothing further needed.

## 2019-11-26 NOTE — Addendum Note (Signed)
Addended by: Purcell Mouton on: 11/26/2019 08:26 AM   Modules accepted: Orders

## 2019-12-01 ENCOUNTER — Telehealth: Payer: Self-pay | Admitting: Internal Medicine

## 2019-12-01 ENCOUNTER — Other Ambulatory Visit: Payer: Self-pay

## 2019-12-01 ENCOUNTER — Other Ambulatory Visit
Admission: RE | Admit: 2019-12-01 | Discharge: 2019-12-01 | Disposition: A | Discharge status: Home / Self Care | Payer: Medicaid Other | Source: Ambulatory Visit | Attending: Gastroenterology | Admitting: Gastroenterology

## 2019-12-01 ENCOUNTER — Ambulatory Visit (INDEPENDENT_AMBULATORY_CARE_PROVIDER_SITE_OTHER): Payer: Medicaid Other | Admitting: Gastroenterology

## 2019-12-01 DIAGNOSIS — K219 Gastro-esophageal reflux disease without esophagitis: Secondary | ICD-10-CM

## 2019-12-01 DIAGNOSIS — D649 Anemia, unspecified: Secondary | ICD-10-CM

## 2019-12-01 DIAGNOSIS — Z1211 Encounter for screening for malignant neoplasm of colon: Secondary | ICD-10-CM | POA: Diagnosis not present

## 2019-12-01 LAB — CBC
HCT: 36.8 % (ref 36.0–46.0)
Hemoglobin: 12.3 g/dL (ref 12.0–15.0)
MCH: 30.9 pg (ref 26.0–34.0)
MCHC: 33.4 g/dL (ref 30.0–36.0)
MCV: 92.5 fL (ref 80.0–100.0)
Platelets: 374 10*3/uL (ref 150–400)
RBC: 3.98 MIL/uL (ref 3.87–5.11)
RDW: 14.6 % (ref 11.5–15.5)
WBC: 10.5 10*3/uL (ref 4.0–10.5)
nRBC: 0 % (ref 0.0–0.2)

## 2019-12-01 LAB — IRON AND TIBC
Iron: 62 ug/dL (ref 28–170)
Saturation Ratios: 13 % (ref 10.4–31.8)
TIBC: 475 ug/dL — ABNORMAL HIGH (ref 250–450)
UIBC: 413 ug/dL

## 2019-12-01 LAB — FERRITIN: Ferritin: 21 ng/mL (ref 11–307)

## 2019-12-01 LAB — VITAMIN B12: Vitamin B-12: 281 pg/mL (ref 180–914)

## 2019-12-01 LAB — FOLATE: Folate: 8.3 ng/mL (ref >5.9)

## 2019-12-01 NOTE — Progress Notes (Signed)
Caitlyn Donath, MD [address]  [address]  [place], [place] [address]  Main: [phone]  Fax: [phone]    Gastroenterology Consultation Virtual/Tele Visit  Referring Provider:     Center, Caitlyn Herrera* Primary Care Physician:  Center, Caitlyn Herrera [facility] Primary Gastroenterologist:  Dr. Arlyss Repress Reason for Consultation:     Dysphagia        HPI:   Caitlyn Herrera is a 59 y.o. female referred by Dr. Eli Herrera, Caitlyn Herrera [facility]  for consultation & management of chronic dysphagia  Virtual Visit via Telephone Note  I connected with Caitlyn Herrera on 12/01/19 at  9:30 AM EST by telephone and verified that I am speaking with the correct person using two identifiers.   I discussed the limitations, risks, security and privacy concerns of performing an evaluation and management service by telephone and the availability of in person appointments. I also discussed with the patient that there may be a patient responsible charge related to this service. The patient expressed understanding and agreed to proceed.  Location of the Patient: Outside, in the car, parked  Location of the provider: Home office   History of Present Illness:  Caitlyn Herrera is a 59 year old female with history of hypertension, ex tobacco use, coronary artery disease who underwent triple bypass in 10/2017. She gained about 40 pounds since bypass to date.  She reports that she has been experiencing choking sensation particularly eating hard meats, solid food and when she eats she feels like food makes her choke or throw up, she has to prop her head up, coughs more at night, had to sit up, she has to keep inhlaer, cough drops and a bottle of water next to her bed.  All these symptoms started after her bypass.  She was briefly on Protonix after the bypass.  She is currently on aspirin and Plavix.  She regularly drinks carbonated beverages, consumes fatty foods, red meat.  She has  cut back on red meat as her symptoms are worse with this.  Patient underwent modified barium swallow in 11/2018 which revealed normal pharyngeal function, no evidence of aspiration. An esophageal sweep in the upright position with liquid and solid consistencies showed distal-esophageal retention of thick liquid baium with retrograde flow below the level of the pharyngoesophageal segment.     Follow-up visit 12/01/2019 Patient did not undergo EGD and colonoscopy as recommended during last visit.  She did not follow-up in office as well.  She reports that her upper GI symptoms are still persistent.  She states she tried omeprazole which made her heartburn worse and therefore did not continue it.  She states she is trying to cut back on smoking currently on nicotine patch.  She states her weight has been stable. She has been followed by pulmonary recently for shortness of breath, cough, currently using inhalers  NSAIDs: None  Antiplts/Anticoagulants/Anti thrombotics: Aspirin and Plavix for coronary disease  GI Procedures: Denies having an EGD or colonoscopy in the past She denies family history of GI malignancy  Past Medical History:  Diagnosis Date  . Abdominal aortic ectasia (HCC)    a. 11/2017 CTA chest/abd/pelvis: 2.5 cm abd ao ectasia -->rec f/u u/s in 5 yrs.  [name] CAD (coronary artery disease)    a. 11/04/17 Cath: Native multivessel dzs-->CABG x 3 (LIMA->LAD, VG->OM2, VG->RPDA; b. 11/2017 MV: mid antlat/apical isch; c. 11/2017 Cath: LM 30d, LAD 56m, LCX 80p/m, OM1 90, OM2 80 (@ anastamosis of graft), RCA  80p, 177m, 90d, LIMA->LAD nl, VG->OM2 nl, VG->RPDA nl-->Med Rx.  . Carotid arterial disease (HCC)    a. 10/2017 Carotid U/S: 1-30% bilat ICA stenosis.  [name] History of echocardiogram    a. 11/04/2017 Echo: EF of 65-70%, no RWMA, nl LV diastolic fxn, nl RV size/fxn, mild TR; b. 03/2018 Echo: EF 60-65%, mild LVH, no rwma, mild MR. Mildy reduced RV fxn. Mild TR.  [name] History of Tobacco abuse   . Hyperlipidemia     . Hypertension   . Left renal artery stenosis (HCC)    a. 11/2017 CTA Chest/Abd/Pelvis: 50-70% L RA stenosis.  . Palpitations    a. 02/2018 Event Monitor: wore for 2 days, 21 hrs - rare PAC's/PVC's. Avg HR 66 (42-103); b. 04/2018 Event Monitor: Wore for 14 days. RSR, 67 (49-111). No significant arrhythmias.  . Persistent cough   . Pulmonary nodule, right    a. 10/2017 CT Chest: 2mm pulm nodule in R lung apex - rec f/u w/ non-contrast chest CT in 1 year.  . Type II diabetes mellitus (HCC)    a. 10/2017 A1c 6.6.    Past Surgical History:  Procedure Laterality Date  . BREAST BIOPSY Right 06/23/2018   US guided biopsy - awaiting pathology  . BREAST CYST EXCISION Right   . CARDIAC CATHETERIZATION    . CORONARY ARTERY BYPASS GRAFT N/A 11/12/2017   Procedure: CORONARY ARTERY BYPASS GRAFTING (CABG) x Three , using left internal mammary artery and right leg greater saphenous vein harvested endoscopically;  Surgeon: Kerin Perna, MD;  Location: [facility] OR;  Service: Open Heart Surgery;  Laterality: N/A;  . DILATION AND CURETTAGE OF UTERUS    . LAPAROSCOPIC CHOLECYSTECTOMY    . LEFT HEART CATH AND CORONARY ANGIOGRAPHY N/A 11/04/2017   Procedure: LEFT HEART CATH AND CORONARY ANGIOGRAPHY;  Surgeon: Yvonne Kendall, MD;  Location: [facility] INVASIVE CV LAB;  Service: Cardiovascular;  Laterality: N/A;  . LEFT HEART CATH AND CORS/GRAFTS ANGIOGRAPHY N/A 12/09/2017   Procedure: LEFT HEART CATH AND CORS/GRAFTS ANGIOGRAPHY;  Surgeon: [name], Peter M, MD;  Location: [facility] INVASIVE CV LAB;  Service: Cardiovascular;  Laterality: N/A;  . TEE WITHOUT CARDIOVERSION N/A 11/12/2017   Procedure: TRANSESOPHAGEAL ECHOCARDIOGRAM (TEE);  Surgeon: Donata Clay, Theron Arista, MD;  Location: [facility] OR;  Service: Open Heart Surgery;  Laterality: N/A;    Current Outpatient Medications:  .  albuterol (PROVENTIL HFA;VENTOLIN HFA) 108 (90 Base) MCG/ACT inhaler, Inhale 2 puffs into the lungs every 6 (six) hours as needed for wheezing or shortness of  breath., Disp: 8 g, Rfl: 2 .  aspirin EC 81 MG tablet, Take 1 tablet (81 mg total) by mouth daily., Disp: 90 tablet, Rfl: 3 .  atorvastatin (LIPITOR) 80 MG tablet, Take 1 tablet (80 mg total) by mouth daily at 6 PM., Disp: 90 tablet, Rfl: 0 .  azelastine (ASTELIN) 0.1 % nasal spray, INSTILL 1 SPRAY INTO EACH NOSTRIL PRN FOR ALLERGIES AND DRAINAGE, Disp: , Rfl:  .  benzonatate (TESSALON) 100 MG capsule, Take 100 mg by mouth as needed. , Disp: , Rfl:  .  cetirizine (ZYRTEC) 10 MG tablet, cetirizine 10 mg tablet  take 1 tablet by mouth once daily, Disp: , Rfl:  .  citalopram (CELEXA) 40 MG tablet, Take 1.5 mg by mouth daily. , Disp: , Rfl:  .  cyclobenzaprine (FLEXERIL) 10 MG tablet, TK 1 T PO TID, Disp: , Rfl:  .  dextromethorphan-guaiFENesin (ROBITUSSIN-DM) 10-100 MG/5ML liquid, Tussin DM 10 mg-100 mg/5 mL oral syrup  take 5 milliliters  by mouth every 6 hours if needed for cough, Disp: , Rfl:  .  fluticasone (FLONASE) 50 MCG/ACT nasal spray, SHAKE LQ AND U 2 SPRAYS IEN QD, Disp: , Rfl:  .  Fluticasone-Salmeterol (ADVAIR DISKUS) 250-50 MCG/DOSE AEPB, Inhale 1 puff into the lungs 2 (two) times daily., Disp: 60 each, Rfl: 3 .  Fluticasone-Umeclidin-Vilant (TRELEGY ELLIPTA) 100-62.5-25 MCG/INH AEPB, Inhale 1 puff into the lungs daily., Disp: 60 each, Rfl: 5 .  hydrochlorothiazide (HYDRODIURIL) 25 MG tablet, Take 1 tablet (25 mg total) by mouth daily., Disp: 90 tablet, Rfl: 1 .  lidocaine (XYLOCAINE) 5 % ointment, Apply 1 application topically daily as needed., Disp: , Rfl:  .  losartan (COZAAR) 100 MG tablet, Take 100 mg by mouth daily., Disp: , Rfl:  .  nicotine (NICODERM CQ - DOSED IN MG/24 HOURS) 21 mg/24hr patch, Place 21 mg onto the skin as directed., Disp: , Rfl:  .  omeprazole (PRILOSEC) 40 MG capsule, TAKE 1 CAPSULE BY MOUTH EVERY DAY BEFORE BREAKFAST, Disp: 30 capsule, Rfl: 1 .  ondansetron (ZOFRAN) 4 MG tablet, Take 1 tablet (4 mg total) by mouth every 8 (eight) hours as needed for nausea or  vomiting., Disp: 20 tablet, Rfl: 0 .  oxybutynin (DITROPAN-XL) 5 MG 24 hr tablet, TK 1T PO QD FOR INCONTINENCE, Disp: , Rfl:  .  tiotropium (SPIRIVA HANDIHALER) 18 MCG inhalation capsule, Place 1 capsule (18 mcg total) into inhaler and inhale daily., Disp: 30 capsule, Rfl: 2 .  traMADol (ULTRAM) 50 MG tablet, TK 1 T PO Q 6 H, Disp: , Rfl:  .  carvedilol (COREG) 12.5 MG tablet, Take 1 tablet (12.5 mg total) by mouth 2 (two) times daily., Disp: 180 tablet, Rfl: 3 .  ezetimibe (ZETIA) 10 MG tablet, Take 1 tablet (10 mg total) by mouth daily., Disp: 90 tablet, Rfl: 3   Family History  Problem Relation Age of Onset  . Hypertension Mother   . Heart Problems Mother   . Breast cancer Neg Hx      Social History   Tobacco Use  . Smoking status: Former Smoker    Packs/day: 1.00    Years: 42.00    Pack years: 42.00    Types: Cigarettes    Quit date: 11/01/2017    Years since quitting: 2.0  . Smokeless tobacco: Never Used  . Tobacco comment: 11/05/2017 "stopped smoking 11/01/2017"  Substance Use Topics  . Alcohol use: No  . Drug use: No    Allergies as of 12/01/2019  . (No Known Allergies)     Imaging Studies: Reviewed  Assessment and Plan:   KONNER WARRIOR is a 59 y.o. female with obesity, hypertension, hyperlipidemia, history of tobacco use, coronary disease status post triple bypass in 10/2017 on DAPT presents with chronic symptoms of dysphagia and reflux since triple bypass.  Patient gained about 40 pounds since bypass.  Her symptoms are highly consistent with poorly controlled acid reflux given her lifestyle and eating habits.  She has tried omeprazole which she did not tolerate it well  Recommend EGD to evaluate dysphagia, GERD, Barrett screening Recommend colonoscopy for colon cancer screening  History of normocytic anemia Recheck CBC, iron studies, B12 and folate panel  Follow Up Instructions:   I discussed the assessment and treatment plan with the patient. The patient  was provided an opportunity to ask questions and all were answered. The patient agreed with the plan and demonstrated an understanding of the instructions.   The patient was advised to call  back or seek an in-person evaluation if the symptoms worsen or if the condition fails to improve as anticipated.  I provided 20 minutes of non-face-to-face time during this encounter.   Follow up after the above work-up   Cephas Darby, MD

## 2019-12-01 NOTE — Telephone Encounter (Signed)
   [facility] Pre-operative Risk Assessment    Request for surgical clearance:  1. What type of surgery is being performed? Colonoscopy and endoscopy  2. When is this surgery scheduled? 12/24/19  3. What type of clearance is required (medical clearance vs. Pharmacy clearance to hold med vs. Both)? Medical   4. Are there any medications that need to be held prior to surgery and how long? None listed, please advise if needed   5. Practice name and name of physician performing surgery? [facility]   6. What is your office phone number [phone]   7.   What is your office fax number [phone], ATTN: Tameka G.  8.   Anesthesia type (None, local, MAC, general) ? General    Caryl Pina Gerringer 12/01/2019, 1:43 PM  _________________________________________________________________   (provider comments below)

## 2019-12-01 NOTE — Telephone Encounter (Signed)
I will send clearance note to Dr. Okey Dupre for 12/15/19 appt. I will fax FYI note to surgeon's office pt has appt 12/15/19 with Dr. Okey Dupre which clearance will be assessed at that time. I will remove from the pre op call back pool.

## 2019-12-01 NOTE — Telephone Encounter (Signed)
   Primary Cardiologist:Christopher End, MD  Chart reviewed as part of pre-operative protocol coverage. Patient is in need of updated EKG (has not had one since 09/2018). She already has an appointment with Dr. Okey Dupre scheduled for 12/15/2019. I did call and speak with patient today and it sounds like she is doing relatively well from a cardiac standpoint. However, she does note some dyspnea with exertion. Suspect patient is OK to proceed with this low risk procedure. However, given she needs an updated EKG and already has an appointment schedule prior to procedure, will let Dr. Okey Dupre address peri-operative risk at upcoming visit.   Pre-op covering staff: - Please contact requesting surgeon's office via preferred method (i.e, phone, fax) to inform them of appointment prior to surgery.  Corrin Parker, PA-C  12/01/2019, 2:38 PM

## 2019-12-03 ENCOUNTER — Telehealth: Payer: Self-pay

## 2019-12-03 NOTE — Telephone Encounter (Signed)
-----   Message from Toney Reil, MD sent at 12/02/2019 11:51 AM EST ----- Please inform patient that she does not have anemia and her iron, B12 and folate levels look normal  RV

## 2019-12-03 NOTE — Telephone Encounter (Signed)
Pt has been notified of results and verbalized understanding  

## 2019-12-06 ENCOUNTER — Other Ambulatory Visit: Payer: Self-pay

## 2019-12-06 ENCOUNTER — Ambulatory Visit: Payer: Medicaid Other | Admitting: Internal Medicine

## 2019-12-06 ENCOUNTER — Encounter: Payer: Self-pay | Admitting: Internal Medicine

## 2019-12-06 VITALS — BP 124/72 | HR 58 | Ht 66.0 in | Wt 197.6 lb

## 2019-12-06 DIAGNOSIS — E119 Type 2 diabetes mellitus without complications: Secondary | ICD-10-CM

## 2019-12-06 DIAGNOSIS — I2510 Atherosclerotic heart disease of native coronary artery without angina pectoris: Secondary | ICD-10-CM | POA: Diagnosis not present

## 2019-12-06 DIAGNOSIS — E785 Hyperlipidemia, unspecified: Secondary | ICD-10-CM

## 2019-12-06 NOTE — Patient Instructions (Signed)
Medication Instructions:  Your physician recommends that you continue on your current medications as directed. Please refer to the Current Medication list given to you today.  If you need a refill on your cardiac medications before your next appointment, please call your pharmacy.   Lab work: Fasting Lipids in 6 months If you have labs (blood work) drawn today and your tests are completely normal, you will receive your results only by: MyChart Message (if you have MyChart) OR A paper copy in the mail If you have any lab test that is abnormal or we need to change your treatment, we will call you to review the results.  Testing/Procedures: NONE  Follow-Up: At [facility], you and your health needs are our priority.  As part of our continuing mission to provide you with exceptional heart care, we have created designated Provider Care Teams.  These Care Teams include your primary Cardiologist (physician) and Advanced Practice Providers (APPs -  Physician Assistants and Nurse Practitioners) who all work together to provide you with the care you need, when you need it. You may see Dr. Rennis Golden or one of the following Advanced Practice Providers on your designated Care Team:    Azalee Course, PA-C  Micah Flesher, [place] or   [name] Pimple, [place]  Your physician wants you to follow-up in: 6 months with Dr. Rennis Golden after labs

## 2019-12-06 NOTE — Progress Notes (Signed)
LIPID CLINIC CONSULT NOTE  Chief Complaint:  Manage dyslipidemia  Primary Care Physician: Center, [name]  Primary Cardiologist:  Yvonne Kendall, MD  HPI:  Caitlyn Herrera is a 59 y.o. female who is being seen today for the evaluation of dyslipidemia at the request of End, Cristal Deer, MD.  This is a pleasant 59 year old female kindly referred by Dr. And with a history of coronary artery disease found to have multivessel coronary disease in 2019, subsequently she underwent three-vessel bypass.  She also has dyslipidemia, hypertension, history of tobacco abuse, type 2 diabetes and renal artery stenosis.  She has been on high potency atorvastatin 80 mg daily and recently ezetimibe 10 mg daily was added.  A repeat lipid profile showed total cholesterol 133, triglycerides 126, HDL 26 and LDL of 82.  She was referred for consideration of PCSK9 inhibitor therapy.  Ms. Viramontes reports an atherogenic diet.  She is currently mildly obese with recent weight gain.  She says that she does eat a fair amount of fried foods including fried chicken and other sources of saturated fats.  She is try to decrease sodas which would certainly help with her diabetes however not as much with her lipids.  She reports minimal physical activity.  She did manage to quit smoking about 2 years ago.  PMHx:  Past Medical History:  Diagnosis Date  . Abdominal aortic ectasia (HCC)    a. 11/2017 CTA chest/abd/pelvis: 2.5 cm abd ao ectasia -->rec f/u u/s in 5 yrs.  [name] CAD (coronary artery disease)    a. 11/04/17 Cath: Native multivessel dzs-->CABG x 3 (LIMA->LAD, VG->OM2, VG->RPDA; b. 11/2017 MV: mid antlat/apical isch; c. 11/2017 Cath: LM 30d, LAD 51m, LCX 80p/m, OM1 90, OM2 80 (@ anastamosis of graft), RCA 80p, 172m, 90d, LIMA->LAD nl, VG->OM2 nl, VG->RPDA nl-->Med Rx.  . Carotid arterial disease (HCC)    a. 10/2017 Carotid U/S: 1-30% bilat ICA stenosis.  [name] History of echocardiogram    a. 11/04/2017  Echo: EF of 65-70%, no RWMA, nl LV diastolic fxn, nl RV size/fxn, mild TR; b. 03/2018 Echo: EF 60-65%, mild LVH, no rwma, mild MR. Mildy reduced RV fxn. Mild TR.  [name] History of Tobacco abuse   . Hyperlipidemia   . Hypertension   . Left renal artery stenosis (HCC)    a. 11/2017 CTA Chest/Abd/Pelvis: 50-70% L RA stenosis.  . Palpitations    a. 02/2018 Event Monitor: wore for 2 days, 21 hrs - rare PAC's/PVC's. Avg HR 66 (42-103); b. 04/2018 Event Monitor: Wore for 14 days. RSR, 67 (49-111). No significant arrhythmias.  . Persistent cough   . Pulmonary nodule, right    a. 10/2017 CT Chest: 65mm pulm nodule in R lung apex - rec f/u w/ non-contrast chest CT in 1 year.  . Type II diabetes mellitus (HCC)    a. 10/2017 A1c 6.6.    Past Surgical History:  Procedure Laterality Date  . BREAST BIOPSY Right 06/23/2018   US guided biopsy - awaiting pathology  . BREAST CYST EXCISION Right   . CARDIAC CATHETERIZATION    . CORONARY ARTERY BYPASS GRAFT N/A 11/12/2017   Procedure: CORONARY ARTERY BYPASS GRAFTING (CABG) x Three , using left internal mammary artery and right leg greater saphenous vein harvested endoscopically;  Surgeon: Kerin Perna, MD;  Location: [facility] OR;  Service: Open Heart Surgery;  Laterality: N/A;  . DILATION AND CURETTAGE OF UTERUS    . LAPAROSCOPIC CHOLECYSTECTOMY    . LEFT HEART CATH  AND CORONARY ANGIOGRAPHY N/A 11/04/2017   Procedure: LEFT HEART CATH AND CORONARY ANGIOGRAPHY;  Surgeon: Nelva Bush, MD;  Location: [facility] CV LAB;  Service: Cardiovascular;  Laterality: N/A;  . LEFT HEART CATH AND CORS/GRAFTS ANGIOGRAPHY N/A 12/09/2017   Procedure: LEFT HEART CATH AND CORS/GRAFTS ANGIOGRAPHY;  Surgeon: Martinique, Peter M, MD;  Location: [facility] CV LAB;  Service: Cardiovascular;  Laterality: N/A;  . TEE WITHOUT CARDIOVERSION N/A 11/12/2017   Procedure: TRANSESOPHAGEAL ECHOCARDIOGRAM (TEE);  Surgeon: Prescott Gum, Collier Salina, MD;  Location: [facility];  Service: Open Heart Surgery;  Laterality:  N/A;    FAMHx:  Family History  Problem Relation Age of Onset  . Hypertension Mother   . Heart Problems Mother   . Breast cancer Neg Hx     SOCHx:   reports that she quit smoking about 2 years ago. Her smoking use included cigarettes. She has a 42.00 pack-year smoking history. She has never used smokeless tobacco. She reports that she does not drink alcohol or use drugs.  ALLERGIES:  No Known Allergies  ROS: Pertinent items noted in HPI and remainder of comprehensive ROS otherwise negative.  HOME MEDS: Current Outpatient Medications on File Prior to Visit  Medication Sig Dispense Refill  . albuterol (PROVENTIL HFA;VENTOLIN HFA) 108 (90 Base) MCG/ACT inhaler Inhale 2 puffs into the lungs every 6 (six) hours as needed for wheezing or shortness of breath. 8 g 2  . aspirin EC 81 MG tablet Take 1 tablet (81 mg total) by mouth daily. 90 tablet 3  . atorvastatin (LIPITOR) 80 MG tablet Take 1 tablet (80 mg total) by mouth daily at 6 PM. 90 tablet 0  . azelastine (ASTELIN) 0.1 % nasal spray INSTILL 1 SPRAY INTO EACH NOSTRIL PRN FOR ALLERGIES AND DRAINAGE    . benzonatate (TESSALON) 100 MG capsule Take 100 mg by mouth as needed.     . carvedilol (COREG) 12.5 MG tablet Take 1 tablet (12.5 mg total) by mouth 2 (two) times daily. 180 tablet 3  . cetirizine (ZYRTEC) 10 MG tablet cetirizine 10 mg tablet  take 1 tablet by mouth once daily    . citalopram (CELEXA) 40 MG tablet Take 1.5 mg by mouth daily.     . cyclobenzaprine (FLEXERIL) 10 MG tablet TK 1 T PO TID    . dextromethorphan-guaiFENesin (ROBITUSSIN-DM) 10-100 MG/5ML liquid Tussin DM 10 mg-100 mg/5 mL oral syrup  take 5 milliliters by mouth every 6 hours if needed for cough    . ezetimibe (ZETIA) 10 MG tablet Take 1 tablet (10 mg total) by mouth daily. 90 tablet 3  . fluticasone (FLONASE) 50 MCG/ACT nasal spray SHAKE LQ AND U 2 SPRAYS IEN QD    . Fluticasone-Salmeterol (ADVAIR DISKUS) 250-50 MCG/DOSE AEPB Inhale 1 puff into the lungs 2  (two) times daily. 60 each 3  . Fluticasone-Umeclidin-Vilant (TRELEGY ELLIPTA) 100-62.5-25 MCG/INH AEPB Inhale 1 puff into the lungs daily. 60 each 5  . hydrochlorothiazide (HYDRODIURIL) 25 MG tablet Take 1 tablet (25 mg total) by mouth daily. 90 tablet 1  . lidocaine (XYLOCAINE) 5 % ointment Apply 1 application topically daily as needed.    [name] losartan (COZAAR) 100 MG tablet Take 100 mg by mouth daily.    . nicotine (NICODERM CQ - DOSED IN MG/24 HOURS) 21 mg/24hr patch Place 21 mg onto the skin as directed.    [name] omeprazole (PRILOSEC) 40 MG capsule TAKE 1 CAPSULE BY MOUTH EVERY DAY BEFORE BREAKFAST 30 capsule 1  . ondansetron (ZOFRAN) 4 MG  tablet Take 1 tablet (4 mg total) by mouth every 8 (eight) hours as needed for nausea or vomiting. 20 tablet 0  . oxybutynin (DITROPAN-XL) 5 MG 24 hr tablet TK 1T PO QD FOR INCONTINENCE    . tiotropium (SPIRIVA HANDIHALER) 18 MCG inhalation capsule Place 1 capsule (18 mcg total) into inhaler and inhale daily. 30 capsule 2  . traMADol (ULTRAM) 50 MG tablet TK 1 T PO Q 6 H     No current facility-administered medications on file prior to visit.    LABS/IMAGING: No results found for this or any previous visit (from the past 48 hour(s)). No results found.  LIPID PANEL:    Component Value Date/Time   CHOL 133 10/27/2019 1045   CHOL 103 12/17/2017 1015   TRIG 126 10/27/2019 1045   HDL 26 (L) 10/27/2019 1045   HDL 31 (L) 12/17/2017 1015   CHOLHDL 5.1 10/27/2019 1045   VLDL 25 10/27/2019 1045   LDLCALC 82 10/27/2019 1045   LDLCALC 56 12/17/2017 1015    WEIGHTS: Wt Readings from Last 3 Encounters:  12/06/19 197 lb 9.6 oz (89.6 kg)  11/09/19 197 lb 6.4 oz (89.5 kg)  09/09/19 180 lb (81.6 kg)    VITALS: BP 124/72   Pulse (!) 58   Ht 5\' 6"  (1.676 m)   Wt 197 lb 9.6 oz (89.6 kg)   SpO2 100%   BMI 31.89 kg/m   EXAM: General appearance: alert and no distress Lungs: clear to auscultation bilaterally Heart: regular rate and rhythm, S1, S2 normal,  no murmur, click, rub or gallop Extremities: extremities normal, atraumatic, no cyanosis or edema Neurologic: Grossly normal  EKG: Deferred  ASSESSMENT: 1. Mixed dyslipidemia, goal LDL less than 70 2. Coronary artery disease status post CABG 3. PAD 4. Diabetes type 2 5. Hypertension 6. Atherogenic diet  PLAN: 1.   Ms. Maltz is noted to have a mixed dyslipidemia despite high potency statin and ezetimibe her LDL remains 82.  Unfortunately recently she has gained some weight and also has been eating somewhat of an atherogenic diet.  I think she could make major improvements in this with additional weight loss and decrease in saturated fats.  I provided her with a number of dietary recommendations today from the National lipid Association and feel that as she has been clinically stable post bypass without recurrent events we could afford her an additional 6 months to work on weight loss, significant dietary changes and improvement in her lipids which will provide more significant benefits overall.  Plan repeat lipid profile in 6 months.  If she is not at target at that time I would recommend adding PCSK9 inhibitor therapy and we could likely discontinue her ezetimibe.  Thanks again for the kind referral.  Rubye Oaks, MD, [facility]  [place]  [facility]  Medical Director of the Advanced Lipid Disorders &  Cardiovascular Risk Reduction Clinic Diplomate of the American Board of Clinical Lipidology Attending Cardiologist  Direct Dial: [phone]  Fax: [phone]  Website:  [phone] Caitlyn Herrera 12/06/2019, 3:11 PM

## 2019-12-14 ENCOUNTER — Ambulatory Visit: Payer: Medicaid Other | Admitting: Pulmonary Disease

## 2019-12-14 DIAGNOSIS — K219 Gastro-esophageal reflux disease without esophagitis: Secondary | ICD-10-CM

## 2019-12-14 NOTE — Patient Instructions (Signed)
We will see you in follow-up in 3 months time  Continue using your Trelegy as you are doing

## 2019-12-14 NOTE — Progress Notes (Signed)
 Assessment & Plan:  There are no diagnoses linked to this encounter.  Patient Instructions  We will see you in follow-up in 3 months time  Continue using your Trelegy as you are doing  Please note: late entry documentation due to logistical difficulties during COVID-19 pandemic. This note is filed for information purposes only, and is not intended to be used for billing, nor does it represent the full scope/nature of the visit in question. Please see any associated scanned media linked to date of encounter for additional pertinent information.  Subjective:    HPI: Caitlyn Herrera is a 59 y.o. female presenting to the pulmonology clinic on 12/14/2019 with report of: No chief complaint on file.    Virtual Visit Via Video or Telephone Note:   This visit type was conducted due to national recommendations for restrictions regarding the COVID-19 pandemic .  This format is felt to be most appropriate for this patient at this time.  All issues noted in this document were discussed and addressed.  No physical exam was performed (except for noted visual exam findings with Video Visits).    I connected with Caitlyn Herrera by telephone at 2:20 PM and verified that I was speaking with the correct person using two identifiers. Location patient: home Location provider: [facility] Persons participating in the virtual visit: patient, physician   I discussed the limitations, risks, security and privacy concerns of performing an evaluation and management service by phone and the availability of in person appointments. The patient expressed understanding and agreed to proceed.  Outpatient Encounter Medications as of 12/14/2019  Medication Sig   aspirin  EC 81 MG tablet Take 1 tablet (81 mg total) by mouth daily.   azelastine (ASTELIN) 0.1 % nasal spray Place 2 sprays into both nostrils as needed.   cetirizine (ZYRTEC) 10 MG tablet Take 10 mg by mouth every morning.    cyclobenzaprine (FLEXERIL) 10 MG tablet Take 10 mg by mouth 3 (three) times daily as needed.   fluticasone  (FLONASE) 50 MCG/ACT nasal spray 2 sprays as needed.   omeprazole  (PRILOSEC) 40 MG capsule TAKE 1 CAPSULE BY MOUTH EVERY DAY BEFORE BREAKFAST   [DISCONTINUED] albuterol  (PROVENTIL  HFA;VENTOLIN  HFA) 108 (90 Base) MCG/ACT inhaler Inhale 2 puffs into the lungs every 6 (six) hours as needed for wheezing or shortness of breath. (Patient not taking: Reported on 03/16/2024)   [DISCONTINUED] atorvastatin  (LIPITOR ) 80 MG tablet Take 1 tablet (80 mg total) by mouth daily at 6 PM.   [DISCONTINUED] benzonatate  (TESSALON ) 100 MG capsule Take 100 mg by mouth as needed.    [DISCONTINUED] carvedilol  (COREG ) 12.5 MG tablet Take 1 tablet (12.5 mg total) by mouth 2 (two) times daily.   [DISCONTINUED] citalopram  (CELEXA ) 40 MG tablet Take 40 mg by mouth daily.    [DISCONTINUED] dextromethorphan-guaiFENesin  (ROBITUSSIN-DM) 10-100 MG/5ML liquid as needed. (Patient not taking: Reported on 03/18/2023)   [DISCONTINUED] ezetimibe  (ZETIA ) 10 MG tablet Take 1 tablet (10 mg total) by mouth daily.   [DISCONTINUED] Fluticasone -Salmeterol (ADVAIR DISKUS) 250-50 MCG/DOSE AEPB Inhale 1 puff into the lungs 2 (two) times daily.   [DISCONTINUED] Fluticasone -Umeclidin-Vilant (TRELEGY ELLIPTA ) 100-62.5-25 MCG/INH AEPB Inhale 1 puff into the lungs daily.   [DISCONTINUED] hydrochlorothiazide  (HYDRODIURIL ) 25 MG tablet Take 1 tablet (25 mg total) by mouth daily.   [DISCONTINUED] lidocaine  (XYLOCAINE ) 5 % ointment Apply 1 application topically daily as needed.   [DISCONTINUED] losartan  (COZAAR ) 100 MG tablet Take 100 mg by mouth daily.   [DISCONTINUED] nicotine  (NICODERM CQ  -  DOSED IN MG/24 HOURS) 21 mg/24hr patch Place 21 mg onto the skin as directed.   [DISCONTINUED] ondansetron  (ZOFRAN ) 4 MG tablet Take 1 tablet (4 mg total) by mouth every 8 (eight) hours as needed for nausea or vomiting.   [DISCONTINUED] oxybutynin (DITROPAN-XL) 5 MG  24 hr tablet TK 1T PO QD FOR INCONTINENCE   [DISCONTINUED] tiotropium (SPIRIVA  HANDIHALER) 18 MCG inhalation capsule Place 1 capsule (18 mcg total) into inhaler and inhale daily.   [DISCONTINUED] traMADol  (ULTRAM ) 50 MG tablet Take 50 mg by mouth every 6 (six) hours as needed.   No facility-administered encounter medications on file as of 12/14/2019.     Review of Systems A 10 point review of systems was performed and it is as noted above otherwise negative.  Objective:   There were no vitals filed for this visit.   No physical exam performed as the visit was via telephone.  Patient did not exhibit conversational dyspnea during the visit.

## 2019-12-15 ENCOUNTER — Ambulatory Visit: Payer: Medicaid Other | Admitting: Internal Medicine

## 2019-12-15 NOTE — Progress Notes (Deleted)
Follow-up Outpatient Visit Date: 12/15/2019  Primary Care Provider: [name] [address] [place] [address]  Chief Complaint: ***  HPI:  Caitlyn Herrera is a 59 y.o. female with history of coronary artery disease status post CABG (10/2017), hypertension, hyperlipidemia, and type 2 diabetes mellitus, who presents for follow-up of coronary artery disease and hyperlipidemia.  I last spoke with Ms Dottavio in 08/2019 via virtual visit, which time she was doing well from a heart standpoint.  Lipids were suboptimal, prompting referral to the lipid clinic.  Lifestyle modifications were recommended in addition to her ongoing therapy with atorvastatin and ezetimibe.  HCTZ 25 mg daily was added due to suboptimal blood pressure control.  --------------------------------------------------------------------------------------------------  Past Medical History:  Diagnosis Date  . Abdominal aortic ectasia (HCC)    a. 11/2017 CTA chest/abd/pelvis: 2.5 cm abd ao ectasia -->rec f/u u/s in 5 yrs.  [name] CAD (coronary artery disease)    a. 11/04/17 Cath: Native multivessel dzs-->CABG x 3 (LIMA->LAD, VG->OM2, VG->RPDA; b. 11/2017 MV: mid antlat/apical isch; c. 11/2017 Cath: LM 30d, LAD 22m, LCX 80p/m, OM1 90, OM2 80 (@ anastamosis of graft), RCA 80p, 169m, 90d, LIMA->LAD nl, VG->OM2 nl, VG->RPDA nl-->Med Rx.  . Carotid arterial disease (HCC)    a. 10/2017 Carotid U/S: 1-30% bilat ICA stenosis.  [name] History of echocardiogram    a. 11/04/2017 Echo: EF of 65-70%, no RWMA, nl LV diastolic fxn, nl RV size/fxn, mild TR; b. 03/2018 Echo: EF 60-65%, mild LVH, no rwma, mild MR. Mildy reduced RV fxn. Mild TR.  [name] History of Tobacco abuse   . Hyperlipidemia   . Hypertension   . Left renal artery stenosis (HCC)    a. 11/2017 CTA Chest/Abd/Pelvis: 50-70% L RA stenosis.  . Palpitations    a. 02/2018 Event Monitor: wore for 2 days, 21 hrs - rare PAC's/PVC's. Avg HR 66 (42-103); b. 04/2018  Event Monitor: Wore for 14 days. RSR, 67 (49-111). No significant arrhythmias.  . Persistent cough   . Pulmonary nodule, right    a. 10/2017 CT Chest: 8mm pulm nodule in R lung apex - rec f/u w/ non-contrast chest CT in 1 year.  . Type II diabetes mellitus (HCC)    a. 10/2017 A1c 6.6.   Past Surgical History:  Procedure Laterality Date  . BREAST BIOPSY Right 06/23/2018   US guided biopsy - awaiting pathology  . BREAST CYST EXCISION Right   . CARDIAC CATHETERIZATION    . CORONARY ARTERY BYPASS GRAFT N/A 11/12/2017   Procedure: CORONARY ARTERY BYPASS GRAFTING (CABG) x Three , using left internal mammary artery and right leg greater saphenous vein harvested endoscopically;  Surgeon: Kerin Perna, MD;  Location: [facility] OR;  Service: Open Heart Surgery;  Laterality: N/A;  . DILATION AND CURETTAGE OF UTERUS    . LAPAROSCOPIC CHOLECYSTECTOMY    . LEFT HEART CATH AND CORONARY ANGIOGRAPHY N/A 11/04/2017   Procedure: LEFT HEART CATH AND CORONARY ANGIOGRAPHY;  Surgeon: Yvonne Kendall, MD;  Location: [facility] INVASIVE CV LAB;  Service: Cardiovascular;  Laterality: N/A;  . LEFT HEART CATH AND CORS/GRAFTS ANGIOGRAPHY N/A 12/09/2017   Procedure: LEFT HEART CATH AND CORS/GRAFTS ANGIOGRAPHY;  Surgeon: [name], Peter M, MD;  Location: [facility] INVASIVE CV LAB;  Service: Cardiovascular;  Laterality: N/A;  . TEE WITHOUT CARDIOVERSION N/A 11/12/2017   Procedure: TRANSESOPHAGEAL ECHOCARDIOGRAM (TEE);  Surgeon: Donata Clay, Theron Arista, MD;  Location: [facility] OR;  Service: Open Heart Surgery;  Laterality: N/A;    No outpatient medications  have been marked as taking for the 12/15/19 encounter (Appointment) with Dollene Mallery, Harrell Gave, MD.    Allergies: Patient has no known allergies.  Social History   Tobacco Use  . Smoking status: Former Smoker    Packs/day: 1.00    Years: 42.00    Pack years: 42.00    Types: Cigarettes    Quit date: 11/01/2017    Years since quitting: 2.1  . Smokeless tobacco: Never Used  . Tobacco comment: 11/05/2017  "stopped smoking 11/01/2017"  Substance Use Topics  . Alcohol use: No  . Drug use: No    Family History  Problem Relation Age of Onset  . Hypertension Mother   . Heart Problems Mother   . Breast cancer Neg Hx     Review of Systems: A 12-system review of systems was performed and was negative except as noted in the HPI.  --------------------------------------------------------------------------------------------------  Physical Exam: There were no vitals taken for this visit.  General:  *** HEENT: No conjunctival pallor or scleral icterus. Facemask in place. Neck: Supple without lymphadenopathy, thyromegaly, JVD, or HJR. Lungs: Normal work of breathing. Clear to auscultation bilaterally without wheezes or crackles. Heart: Regular rate and rhythm without murmurs, rubs, or gallops. Non-displaced PMI. Abd: Bowel sounds present. Soft, NT/ND without hepatosplenomegaly Ext: No lower extremity edema. Radial, PT, and DP pulses are 2+ bilaterally. Skin: Warm and dry without rash.  EKG:  ***  Lab Results  Component Value Date   WBC 10.5 12/01/2019   HGB 12.3 12/01/2019   HCT 36.8 12/01/2019   MCV 92.5 12/01/2019   PLT 374 12/01/2019    Lab Results  Component Value Date   NA 138 10/27/2019   K 3.7 10/27/2019   CL 100 10/27/2019   CO2 26 10/27/2019   BUN 11 10/27/2019   CREATININE 1.20 (H) 10/27/2019   GLUCOSE 190 (H) 10/27/2019   ALT 13 10/27/2019    Lab Results  Component Value Date   CHOL 133 10/27/2019   HDL 26 (L) 10/27/2019   LDLCALC 82 10/27/2019   TRIG 126 10/27/2019   CHOLHDL 5.1 10/27/2019    --------------------------------------------------------------------------------------------------  ASSESSMENT AND PLAN: Harrell Gave Laird Runnion, MD 12/15/2019 8:29 AM

## 2019-12-22 ENCOUNTER — Other Ambulatory Visit: Admission: RE | Admit: 2019-12-22 | Payer: Medicaid Other | Source: Ambulatory Visit

## 2019-12-22 ENCOUNTER — Telehealth: Payer: Self-pay | Admitting: Gastroenterology

## 2019-12-22 NOTE — Telephone Encounter (Signed)
Informed patient by a detail message that procedure has been canceled

## 2019-12-22 NOTE — Telephone Encounter (Signed)
Patient called to cancel colonoscopy for 12-24-2019 she will call back in Auguest to reschedule. Doesn't want to have done now.

## 2019-12-22 NOTE — Telephone Encounter (Signed)
Called trish and canceled procedure  

## 2019-12-24 ENCOUNTER — Ambulatory Visit: Admission: RE | Admit: 2019-12-24 | Payer: Medicaid Other | Source: Home / Self Care | Admitting: Gastroenterology

## 2019-12-24 ENCOUNTER — Encounter: Admission: RE | Payer: Self-pay | Source: Home / Self Care

## 2019-12-24 SURGERY — COLONOSCOPY WITH PROPOFOL
Anesthesia: General

## 2020-02-10 ENCOUNTER — Ambulatory Visit: Payer: Medicaid Other | Admitting: Physician Assistant

## 2020-02-24 ENCOUNTER — Other Ambulatory Visit: Payer: Self-pay | Admitting: Internal Medicine

## 2020-02-24 NOTE — Telephone Encounter (Signed)
Please reschedule appointment. Patient did not show for last scheduled appointment on 02/16/2020. Thank you!

## 2020-02-25 NOTE — Telephone Encounter (Signed)
LVM for patient to call back. ?

## 2020-02-28 NOTE — Telephone Encounter (Signed)
Attempted to schedule.  LMOV to call office.  ° °

## 2020-03-02 ENCOUNTER — Other Ambulatory Visit: Payer: Self-pay

## 2020-03-02 ENCOUNTER — Ambulatory Visit (INDEPENDENT_AMBULATORY_CARE_PROVIDER_SITE_OTHER): Payer: Medicaid Other | Admitting: Family

## 2020-03-02 ENCOUNTER — Encounter: Payer: Self-pay | Admitting: Family

## 2020-03-02 ENCOUNTER — Other Ambulatory Visit
Admission: RE | Admit: 2020-03-02 | Discharge: 2020-03-02 | Disposition: A | Discharge status: Home / Self Care | Payer: Medicaid Other | Source: Ambulatory Visit | Attending: Family | Admitting: Family

## 2020-03-02 VITALS — BP 126/60 | HR 63 | Ht 67.0 in | Wt 198.5 lb

## 2020-03-02 DIAGNOSIS — R0600 Dyspnea, unspecified: Secondary | ICD-10-CM

## 2020-03-02 DIAGNOSIS — I2510 Atherosclerotic heart disease of native coronary artery without angina pectoris: Secondary | ICD-10-CM | POA: Insufficient documentation

## 2020-03-02 DIAGNOSIS — Z79899 Other long term (current) drug therapy: Secondary | ICD-10-CM

## 2020-03-02 DIAGNOSIS — I10 Essential (primary) hypertension: Secondary | ICD-10-CM

## 2020-03-02 DIAGNOSIS — E785 Hyperlipidemia, unspecified: Secondary | ICD-10-CM | POA: Diagnosis not present

## 2020-03-02 DIAGNOSIS — D649 Anemia, unspecified: Secondary | ICD-10-CM

## 2020-03-02 DIAGNOSIS — J449 Chronic obstructive pulmonary disease, unspecified: Secondary | ICD-10-CM

## 2020-03-02 DIAGNOSIS — R0609 Other forms of dyspnea: Secondary | ICD-10-CM

## 2020-03-02 LAB — CBC
HCT: 34.6 % — ABNORMAL LOW (ref 36.0–46.0)
Hemoglobin: 11.8 g/dL — ABNORMAL LOW (ref 12.0–15.0)
MCH: 32.2 pg (ref 26.0–34.0)
MCHC: 34.1 g/dL (ref 30.0–36.0)
MCV: 94.5 fL (ref 80.0–100.0)
Platelets: 328 10*3/uL (ref 150–400)
RBC: 3.66 MIL/uL — ABNORMAL LOW (ref 3.87–5.11)
RDW: 12.8 % (ref 11.5–15.5)
WBC: 11 10*3/uL — ABNORMAL HIGH (ref 4.0–10.5)
nRBC: 0 % (ref 0.0–0.2)

## 2020-03-02 LAB — COMPREHENSIVE METABOLIC PANEL
ALT: 14 U/L (ref 0–44)
AST: 16 U/L (ref 15–41)
Albumin: 3.8 g/dL (ref 3.5–5.0)
Alkaline Phosphatase: 70 U/L (ref 38–126)
Anion gap: 8 (ref 5–15)
BUN: 16 mg/dL (ref 8–23)
CO2: 22 mmol/L (ref 22–32)
Calcium: 9.3 mg/dL (ref 8.9–10.3)
Chloride: 105 mmol/L (ref 98–111)
Creatinine, Ser: 0.99 mg/dL (ref 0.44–1.00)
GFR calc Af Amer: >60 mL/min (ref >60)
GFR calc non Af Amer: >60 mL/min (ref >60)
Glucose, Bld: 287 mg/dL — ABNORMAL HIGH (ref 70–99)
Potassium: 3.5 mmol/L (ref 3.5–5.1)
Sodium: 135 mmol/L (ref 135–145)
Total Bilirubin: 0.7 mg/dL (ref 0.3–1.2)
Total Protein: 7.4 g/dL (ref 6.5–8.1)

## 2020-03-02 LAB — LIPID PANEL
Cholesterol: 113 mg/dL (ref 0–200)
HDL: 24 mg/dL — ABNORMAL LOW (ref >40)
LDL Cholesterol: 66 mg/dL (ref 0–99)
Total CHOL/HDL Ratio: 4.7 RATIO
Triglycerides: 116 mg/dL (ref <150)
VLDL: 23 mg/dL (ref 0–40)

## 2020-03-02 MED ORDER — ISOSORBIDE MONONITRATE ER 30 MG PO TB24: 15.0000 mg | ORAL_TABLET | Freq: Every day | ORAL | 3 refills | Status: DC

## 2020-03-02 NOTE — Progress Notes (Signed)
Office Visit    Patient Name: Caitlyn Herrera Date of Encounter: 03/02/2020  Primary Care Provider:  [facility] Primary Cardiologist:  Nelva Bush, MD Electrophysiologist:  None   Chief Complaint    Caitlyn Herrera is a 59 y.o. female with a hx of CAD s/p CABG (10/2017), HTN, HLD, DM 2.  Presents today for follow-up of CAD.  Past Medical History    Past Medical History:  Diagnosis Date  . Abdominal aortic ectasia ([place])    a. 11/2017 CTA chest/abd/pelvis: 2.5 cm abd ao ectasia -->rec f/u u/s in 5 yrs.  [name] CAD (coronary artery disease)    a. 11/04/17 Cath: Native multivessel dzs-->CABG x 3 (LIMA->LAD, VG->OM2, VG->RPDA; b. 11/2017 MV: mid antlat/apical isch; c. 11/2017 Cath: LM 30d, LAD 10m LCX 80p/m, OM1 90, OM2 80 (@ anastamosis of graft), RCA 80p, 1074m90d, LIMA->LAD nl, VG->OM2 nl, VG->RPDA nl-->Med Rx.  . Carotid arterial disease ([facility]   a. 10/2017 Carotid U/S: 1-30% bilat ICA stenosis.  . Marland Kitchenistory of echocardiogram    a. 11/04/2017 Echo: EF of 65-70%, no RWMA, nl LV diastolic fxn, nl RV size/fxn, mild TR; b. 03/2018 Echo: EF 60-65%, mild LVH, no rwma, mild MR. Mildy reduced RV fxn. Mild TR.  . Marland Kitchenistory of Tobacco abuse   . Hyperlipidemia   . Hypertension   . Left renal artery stenosis (H[facility])    a. 11/2017 CTA Chest/Abd/Pelvis: 50-70% L RA stenosis.  . Palpitations    a. 02/2018 Event Monitor: wore for 2 days, 21 hrs - rare PAC's/PVC's. Avg HR 66 (42-103); b. 04/2018 Event Monitor: Wore for 14 days. RSR, 67 (49-111). No significant arrhythmias.  . Persistent cough   . Pulmonary nodule, right    a. 10/2017 CT Chest: 56m11mulm nodule in R lung apex - rec f/u w/ non-contrast chest CT in 1 year.  . Type II diabetes mellitus ([facility]  a. 10/2017 A1c 6.6.   Past Surgical History:  Procedure Laterality Date  . BREAST BIOPSY Right 06/23/2018   US [place] biopsy - awaiting pathology  . BREAST CYST EXCISION Right   . CARDIAC CATHETERIZATION    . CORONARY  ARTERY BYPASS GRAFT N/A 11/12/2017   Procedure: CORONARY ARTERY BYPASS GRAFTING (CABG) x Three , using left internal mammary artery and right leg greater saphenous vein harvested endoscopically;  Surgeon: VanIvin PootD;  [facility];  Laterality: N/A;  . DILATION AND CURETTAGE OF UTERUS    . LAPAROSCOPIC CHOLECYSTECTOMY    . LEFT HEART CATH AND CORONARY ANGIOGRAPHY N/A 11/04/2017   Procedure: LEFT HEART CATH AND CORONARY ANGIOGRAPHY;  Surgeon: EndNelva BushD;  [facility];  Service: Cardiovascular;  Laterality: N/A;  . LEFT HEART CATH AND CORS/GRAFTS ANGIOGRAPHY N/A 12/09/2017   Procedure: LEFT HEART CATH AND CORS/GRAFTS ANGIOGRAPHY;  Surgeon: JorMartiniqueeter M, MD;  Location: [facility];  Service: Cardiovascular;  Laterality: N/A;  . TEE WITHOUT CARDIOVERSION N/A 11/12/2017   Procedure: TRANSESOPHAGEAL ECHOCARDIOGRAM (TEE);  Surgeon: VanPrescott GumetCollier SalinaD;  [facility];  Laterality: N/A;    Allergies  No Known Allergies  History of Present Illness    Caitlyn Herrera a 59 48o. female with a hx of CAD s/p CABG (10/2017), HTN, HLD, DM 2, GERD, COPD.  She was last seen by Dr. EndSaunders Revel/09/2019 and by Dr. HilDebara Pickettr lipid clinic 12/06/2019.  At  last virtual visit with Dr. Saunders Revel in November she was referred to pulmonology for presumed COPD.  At visit with Dr. Debara Pickett in February regarding cholesterol noted LDL of 82 despite atorvastatin 80 mg and Zetia 10 mg.  She was recommended to make dietary and lifestyle changes with repeat lipid profile in 6 months-at that time if LDL not at goal consider PCSK9i and likely discontinuation of Zetia.  Follows with GI regarding chronic symptoms of dysphagia and reflux since bypass.  Per most recent GI note symptoms consistent with poorly controlled acid reflux.  She did not tolerate omeprazole.  She was recommended for EGD and colonoscopy but these have not been  completed.  Very pleasant lady who lives with her 37 year old granddaughter.  Chief complaint today of shortness of breath and occasional irregular heart beat.    Tells me when she walks around the house she has to stop and go because she gets short of breath. Tells me she gets tired easily. Tells me this is about the same as it has been.  She did complete cardiac rehab after her CABG.  She tells me she has a low exercise tolerance that she has significant bilateral hip pain. Reports no chest pain, pressure, or tightness.  Her presentation with previous MI was exertional, substernal chest pain that had become more progressive associated with nausea, vomiting, shortness of breath.  Notices sometimes she notices a "skip or flutter" in her heart. Tells me it lasts just a second. Occurs at rest. Not associated with pain or shortness of breath.  We discussed her known PVCs and PACs.  We discussed triggers including stress, caffeine.  Reassurance provided that these were not dangerous.  Tells me her blood pressure has been overall good at home with no low readings.  Reports no lightheadedness, dizziness, near-syncope, syncope.   EKGs/Labs/Other Studies Reviewed:   The following studies were reviewed today: Long-term monitor 04/2018  The patient was enrolled for 14 days. 20% of the monitoring period yielded diagnostic tracings.  The predominant rhythm was sinus with an average rate of 67 bpm (range 49-111 bpm).  No significant arrhythmia was observed.   Sinus rhythm without significant arrhythmias.  Study significant limited by short monitoring period (only 20% of monitoring period yielded diagnostic tracings).  Echo 03/2018 Left ventricle: The cavity size was at the upper limits of    normal. Wall thickness was increased in a pattern of mild LVH.    Systolic function was normal. The estimated ejection fraction was    in the range of 60% to 65%. Wall motion was normal; there were no    regional  wall motion abnormalities. Left ventricular diastolic    function parameters were normal.  - Mitral valve: There was mild regurgitation.  - Right ventricle: The cavity size was normal. Systolic function    was mildly reduced.  - Tricuspid valve: There was mild regurgitation.   Monitor 02/2018  The patient was monitored for 2 days, 21 hours.  The predominant rhythm was sinus with an average rate of 66 bpm (range 42-103).  Rare PAC's and PVC's were noted.  There were no sustained arrhythmias or prolonged pauses.   Sinus rhythm with rare PAC's and PVC's.  Brief monitoring period (<3 days).  LHC 11/2017  Dist LM lesion is 30% stenosed.  Mid LAD lesion is 95% stenosed.  Lat 1st Mrg lesion is 90% stenosed.  Prox Cx to Mid Cx lesion is 80% stenosed.  2nd Mrg lesion is 80% stenosed.  Prox  RCA lesion is 80% stenosed.  Mid RCA lesion is 100% stenosed.  Dist RCA lesion is 90% stenosed.  LIMA graft was visualized by angiography and is normal in caliber.  The graft exhibits no disease.  SVG graft was visualized by angiography and is normal in caliber.  The graft exhibits no disease.  SVG graft was visualized by angiography and is very large.  The graft exhibits no disease.  LV end diastolic pressure is normal.   1. Severe 3 vessel occlusive CAD.     - 95% mid LAD    - 90% first OM. This is a small caliber vessel and was not bypassed.    - 80-85% anastomotic lesion at the insertion of the SVG into OM 2.    - 100% mid RCA 2. Patent LIMA to the LAD 3. Patent SVG to OM 2 4. Patent SVG to PDA 5. Normal LVEDP.  Plan: continue medical therapy. Perhaps the Myoview findings could be explained base on disease in OM1. While there is an anastomotic lesion at the touch down of the SVG to OM2 there is a large graft to native vessel size differential that makes this unfavorable for PCI. I think it is unlikely that this is the cause of her symptoms.    EKG:  EKG is ordered today.  The  ekg ordered today demonstrates SR 63 bpm with inferior leads difficult to interpret due to artifact, but overall stable and stable lateral ST/T wave changes. Reviewed by Dr. Saunders Revel in clinic.   Recent Labs: 03/02/2020: ALT 14; BUN 16; Creatinine, Ser 0.99; Hemoglobin 11.8; Platelets 328; Potassium 3.5; Sodium 135  Recent Lipid Panel    Component Value Date/Time   CHOL 113 03/02/2020 1623   CHOL 103 12/17/2017 1015   TRIG 116 03/02/2020 1623   HDL 24 (L) 03/02/2020 1623   HDL 31 (L) 12/17/2017 1015   CHOLHDL 4.7 03/02/2020 1623   VLDL 23 03/02/2020 1623   LDLCALC 66 03/02/2020 1623   LDLCALC 56 12/17/2017 1015    Home Medications   Current Meds  Medication Sig  . albuterol (PROVENTIL HFA;VENTOLIN HFA) 108 (90 Base) MCG/ACT inhaler Inhale 2 puffs into the lungs every 6 (six) hours as needed for wheezing or shortness of breath.  [name] aspirin EC 81 MG tablet Take 1 tablet (81 mg total) by mouth daily.  [name] atorvastatin (LIPITOR) 80 MG tablet Take 1 tablet (80 mg total) by mouth daily at 6 PM.  . azelastine (ASTELIN) 0.1 % nasal spray INSTILL 1 SPRAY INTO EACH NOSTRIL PRN FOR ALLERGIES AND DRAINAGE  . benzonatate (TESSALON) 100 MG capsule Take 100 mg by mouth as needed.   . carvedilol (COREG) 12.5 MG tablet TAKE 1 TABLET BY MOUTH TWICE DAILY  . cetirizine (ZYRTEC) 10 MG tablet cetirizine 10 mg tablet  take 1 tablet by mouth once daily  . citalopram (CELEXA) 20 MG tablet Take 20 mg by mouth daily.  . cyclobenzaprine (FLEXERIL) 10 MG tablet TK 1 T PO TID  . dextromethorphan-guaiFENesin (ROBITUSSIN-DM) 10-100 MG/5ML liquid Tussin DM 10 mg-100 mg/5 mL oral syrup  take 5 milliliters by mouth every 6 hours if needed for cough  . ezetimibe (ZETIA) 10 MG tablet Take 1 tablet (10 mg total) by mouth daily.  . fluticasone (FLONASE) 50 MCG/ACT nasal spray SHAKE LQ AND U 2 SPRAYS IEN QD  . Fluticasone-Salmeterol (ADVAIR DISKUS) 250-50 MCG/DOSE AEPB Inhale 1 puff into the lungs 2 (two) times daily.  .  Fluticasone-Umeclidin-Vilant (TRELEGY ELLIPTA) 100-62.5-25 MCG/INH AEPB  Inhale 1 puff into the lungs daily.  . hydrochlorothiazide (HYDRODIURIL) 25 MG tablet Take 1 tablet (25 mg total) by mouth daily.  [name] lidocaine (XYLOCAINE) 5 % ointment Apply 1 application topically daily as needed.  [name] losartan (COZAAR) 100 MG tablet Take 100 mg by mouth daily.  [name] omeprazole (PRILOSEC) 40 MG capsule TAKE 1 CAPSULE BY MOUTH EVERY DAY BEFORE BREAKFAST  . ondansetron (ZOFRAN) 4 MG tablet Take 1 tablet (4 mg total) by mouth every 8 (eight) hours as needed for nausea or vomiting.  [name] oxybutynin (DITROPAN-XL) 5 MG 24 hr tablet TK 1T PO QD FOR INCONTINENCE  . tiotropium (SPIRIVA HANDIHALER) 18 MCG inhalation capsule Place 1 capsule (18 mcg total) into inhaler and inhale daily.  . traMADol (ULTRAM) 50 MG tablet TK 1 T PO Q 6 H    Review of Systems   Review of Systems  Constitution: Negative for chills, fever and malaise/fatigue.  Cardiovascular: Positive for dyspnea on exertion. Negative for chest pain, leg swelling, near-syncope, orthopnea, palpitations and syncope.  Respiratory: Negative for cough, shortness of breath and wheezing.   Musculoskeletal: Positive for joint pain (hip pain).  Gastrointestinal: Negative for nausea and vomiting.  Neurological: Negative for dizziness, light-headedness and weakness.   All other systems reviewed and are otherwise negative except as noted above.  Physical Exam    VS:  BP 126/60 (BP Location: Left Arm, Patient Position: Sitting, Cuff Size: Normal)   Pulse 63   Ht '5\' 7"'$  (1.702 m)   Wt 198 lb 8 oz (90 kg)   SpO2 99%   BMI 31.09 kg/m  , BMI Body mass index is 31.09 kg/m. GEN: Well nourished, overweight, well developed, in no acute distress. HEENT: normal. Neck: Supple, no JVD, carotid bruits, or masses. Cardiac: RRR, no murmurs, rubs, or gallops. No clubbing, cyanosis, edema.  Radials/DP/PT 2+ and equal bilaterally.  Respiratory:  Respirations regular and unlabored,  clear to auscultation bilaterally. GI: Soft, nontender, nondistended MS: No deformity or atrophy. Skin: Warm and dry, no rash. Neuro:  Strength and sensation are intact. Anxious.  Psych: Normal affect. Assessment & Plan    1. CAD - s/p CABG 10/2017 with cath 02/2018 showing patent grafts with 90% first OM which was small caliber vessel and not bypassed as well as 80 to 85% anatomic lesion at the insertion of the SVG and OM 2.  Cath images reviewed by Dr. Saunders Revel in clinic today -noted that large grafts went into very small diseased native vessels.  Her EKG shows overall stable lateral ST/T wave changes.  She reports no chest pain, pressure, tightness but does note significant DOE that is overall stable at her baseline.  GDMT includes aspirin, beta-blocker, statin, Zetia, as needed nitroglycerin.  She has not had to take her as needed nitroglycerin.  For stable anginal symptoms, we will add Imdur 15 mg daily.  Recommend she take in the evening to prevent headache.  She is understandably anxious about having another heart attack or further disease, reviewed most recent cardiac cath diagram showing patent intense and severe underlying disease, discussed possibly joining a support group for women with heart disease and she was provided with information.  2. HLD, LDL goal <70 -lipid panel 09/2019 with LDL 82.  Per Dr. Lysbeth Penner note 11/2019 plan for repeat lipid panel in 6 months from visit and if LDL remains greater than 70 consideration of PCSK9 and discontinuation of Zetia.  She has a recall for an appointment with Dr. Debara Pickett in August.  3.  GERD - Follows with GI.  Plans to have her EGD done in August.  4. COPD - Follows with pulmonology.   5. DOE - Etiology likely multifactorial deconditioning, CAD, COPD, GERD.  Continue management of COPD per pulmonology.  Lung sounds are clear on exam.  Addition of Imdur for potential anginal symptoms, as above.  Disposition: Add Imdur 15 mg daily.  Labs including CBC,  c-Met.  Follow up in 2 month(s) with Dr. Saunders Revel or APP.    Loel Dubonnet, NP 03/02/2020, 4:50 PM

## 2020-03-02 NOTE — Patient Instructions (Addendum)
Medication Instructions:  Your physician has recommended you make the following change in your medication:   START: isosorbide mononitrate (imdur) 30 mg tablet: Take 1/2 tablet (15 mg total) by mouth once a day at bedtime   *If you need a refill on your cardiac medications before your next appointment, please call your pharmacy*  Lab Work: Your physician recommends that you return for lab work today: CMET, CBC  If you have labs (blood work) drawn today and your tests are completely normal, you will receive your results only by: [name] MyChart Message (if you have MyChart) OR . A paper copy in the mail If you have any lab test that is abnormal or we need to change your treatment, we will call you to review the results.   Testing/Procedures: Your EKG today was stable.   Follow-Up:  Follow up with Eula Listen, PA on 05/12/20 at 1:30 PM  Other Instructions  There is a support group for women with heart disease. The link to sign up [email or web address]  Your palpitations are likely early beats called PVC and PAC. These are not dangerous. We know that you have these based on your previous heart monitors.    Palpitations Palpitations are feelings that your heartbeat is not normal. Your heartbeat may feel like it is:  Uneven.  Faster than normal.  Fluttering.  Skipping a beat. This is usually not a serious problem. In some cases, you may need tests to rule out any serious problems. Follow these instructions at home: Pay attention to any changes in your condition. Take these actions to help manage your symptoms: Eating and drinking  Avoid: ? Coffee, tea, soft drinks, and energy drinks. ? Chocolate. ? Alcohol. ? Diet pills. Lifestyle   Try to lower your stress. These things can help you relax: ? Yoga. ? Deep breathing and meditation. ? Exercise. ? Using words and images to create positive thoughts (guided imagery). ? Using your mind to  control things in your body (biofeedback).  Do not use drugs.  Get plenty of rest and sleep. Keep a regular bed time. General instructions   Take over-the-counter and prescription medicines only as told by your doctor.  Do not use any products that contain nicotine or tobacco, such as cigarettes and e-cigarettes. If you need help quitting, ask your doctor.  Keep all follow-up visits as told by your doctor. This is important. You may need more tests if palpitations do not go away or get worse. Contact a doctor if:  Your symptoms last more than 24 hours.  Your symptoms occur more often. Get help right away if you:  Have chest pain.  Feel short of breath.  Have a very bad headache.  Feel dizzy.  Pass out (faint). Summary  Palpitations are feelings that your heartbeat is uneven or faster than normal. It may feel like your heart is fluttering or skipping a beat.  Avoid food and drinks that may cause palpitations. These include caffeine, chocolate, and alcohol.  Try to lower your stress. Do not smoke or use drugs.  Get help right away if you faint or have chest pain, shortness of breath, a severe headache, or dizziness. This information is not intended to replace advice given to you by your health care provider. Make sure you discuss any questions you have with your health care provider. Document Revised: 11/26/2017 Document Reviewed: 11/26/2017 Elsevier Patient Education  2020 [employer].

## 2020-03-07 ENCOUNTER — Other Ambulatory Visit: Payer: Self-pay | Admitting: Pulmonary Disease

## 2020-03-07 ENCOUNTER — Other Ambulatory Visit: Payer: Self-pay

## 2020-03-07 MED ORDER — HYDROCHLOROTHIAZIDE 25 MG PO TABS: 25.0000 mg | ORAL_TABLET | Freq: Every day | ORAL | 1 refills | Status: DC

## 2020-04-11 ENCOUNTER — Other Ambulatory Visit: Payer: Self-pay

## 2020-04-11 DIAGNOSIS — E785 Hyperlipidemia, unspecified: Secondary | ICD-10-CM

## 2020-04-11 MED ORDER — ATORVASTATIN CALCIUM 80 MG PO TABS: 80.0000 mg | ORAL_TABLET | Freq: Every day | ORAL | 0 refills | Status: DC

## 2020-04-13 ENCOUNTER — Other Ambulatory Visit: Payer: Self-pay

## 2020-04-13 ENCOUNTER — Ambulatory Visit: Payer: Medicaid Other | Admitting: Pulmonary Disease

## 2020-04-13 ENCOUNTER — Encounter: Payer: Self-pay | Admitting: Pulmonary Disease

## 2020-04-13 VITALS — BP 120/70 | HR 70 | Temp 98.1°F | Ht 67.0 in | Wt 195.0 lb

## 2020-04-13 DIAGNOSIS — K219 Gastro-esophageal reflux disease without esophagitis: Secondary | ICD-10-CM

## 2020-04-13 MED ORDER — TRELEGY ELLIPTA 100-62.5-25 MCG/INH IN AEPB: 1.0000 | INHALATION_SPRAY | Freq: Every day | RESPIRATORY_TRACT | 0 refills | Status: AC

## 2020-04-13 NOTE — Patient Instructions (Signed)
We will try Trelegy Ellipta again.  This seems to have worked better for you.  Remember that on the Trelegy you are not  to take Advair or Spiriva.  We will see you in follow-up in 4 months time, call sooner should any new problems arise.

## 2020-04-13 NOTE — Progress Notes (Signed)
 Assessment & Plan:  1. Chronic GERD (Primary)   Patient Instructions  We will try Trelegy Ellipta  again.  This seems to have worked better for you.  Remember that on the Trelegy you are not  to take Advair or Spiriva .  We will see you in follow-up in 4 months time, call sooner should any new problems arise.  Please note: late entry documentation due to logistical difficulties during COVID-19 pandemic. This note is filed for information purposes only, and is not intended to be used for billing, nor does it represent the full scope/nature of the visit in question. Please see any associated scanned media linked to date of encounter for additional pertinent information.  Subjective:    HPI: Caitlyn Herrera is a 59 y.o. female presenting to the pulmonology clinic on 04/13/2020 with report of: Follow-up (pt c/o chest congestion, prod cough with clear to yellow mucus and sob with exertion. )     Outpatient Encounter Medications as of 04/13/2020  Medication Sig   aspirin  EC 81 MG tablet Take 1 tablet (81 mg total) by mouth daily.   azelastine (ASTELIN) 0.1 % nasal spray Place 2 sprays into both nostrils as needed.   cetirizine (ZYRTEC) 10 MG tablet Take 10 mg by mouth every morning.   cyclobenzaprine (FLEXERIL) 10 MG tablet Take 10 mg by mouth 3 (three) times daily as needed.   fluticasone  (FLONASE) 50 MCG/ACT nasal spray 2 sprays as needed.   omeprazole  (PRILOSEC) 40 MG capsule TAKE 1 CAPSULE BY MOUTH EVERY DAY BEFORE BREAKFAST   [DISCONTINUED] albuterol  (PROVENTIL  HFA;VENTOLIN  HFA) 108 (90 Base) MCG/ACT inhaler Inhale 2 puffs into the lungs every 6 (six) hours as needed for wheezing or shortness of breath. (Patient not taking: Reported on 03/16/2024)   [DISCONTINUED] atorvastatin  (LIPITOR ) 80 MG tablet Take 1 tablet (80 mg total) by mouth daily at 6 PM.   [DISCONTINUED] benzonatate  (TESSALON ) 100 MG capsule Take 100 mg by mouth as needed.    [DISCONTINUED] carvedilol  (COREG ) 12.5 MG  tablet TAKE 1 TABLET BY MOUTH TWICE DAILY   [DISCONTINUED] citalopram  (CELEXA ) 20 MG tablet Take 20 mg by mouth daily.   [DISCONTINUED] dextromethorphan-guaiFENesin  (ROBITUSSIN-DM) 10-100 MG/5ML liquid as needed. (Patient not taking: Reported on 03/18/2023)   [DISCONTINUED] Fluticasone -Salmeterol (ADVAIR DISKUS) 250-50 MCG/DOSE AEPB Inhale 1 puff into the lungs 2 (two) times daily.   [DISCONTINUED] Fluticasone -Umeclidin-Vilant (TRELEGY ELLIPTA ) 100-62.5-25 MCG/INH AEPB Inhale 1 puff into the lungs daily.   [DISCONTINUED] hydrochlorothiazide  (HYDRODIURIL ) 25 MG tablet Take 1 tablet (25 mg total) by mouth daily. (Patient not taking: Reported on 05/12/2020)   [DISCONTINUED] isosorbide  mononitrate (IMDUR ) 30 MG 24 hr tablet Take 0.5 tablets (15 mg total) by mouth at bedtime.   [DISCONTINUED] lidocaine  (XYLOCAINE ) 5 % ointment Apply 1 application topically daily as needed.   [DISCONTINUED] losartan  (COZAAR ) 100 MG tablet Take 100 mg by mouth daily.   [DISCONTINUED] ondansetron  (ZOFRAN ) 4 MG tablet Take 1 tablet (4 mg total) by mouth every 8 (eight) hours as needed for nausea or vomiting.   [DISCONTINUED] oxybutynin (DITROPAN-XL) 5 MG 24 hr tablet TK 1T PO QD FOR INCONTINENCE   [DISCONTINUED] SPIRIVA  HANDIHALER 18 MCG inhalation capsule PLACE 1 CAPSULE IN HANDIHALER AND INHALE EVERY DAY (Patient not taking: Reported on 12/13/2020)   [DISCONTINUED] traMADol  (ULTRAM ) 50 MG tablet Take 50 mg by mouth every 6 (six) hours as needed.   [EXPIRED] Fluticasone -Umeclidin-Vilant (TRELEGY ELLIPTA ) 100-62.5-25 MCG/INH AEPB Inhale 1 puff into the lungs daily for 1 day.   [DISCONTINUED] ezetimibe  (  ZETIA ) 10 MG tablet Take 1 tablet (10 mg total) by mouth daily.   No facility-administered encounter medications on file as of 04/13/2020.      Objective:   Vitals:   04/13/20 1405  BP: 120/70  Pulse: 70  Temp: 98.1 F (36.7 C)  Height: 5' 7 (1.702 m)  Weight: 195 lb (88.5 kg)  SpO2: 97%  TempSrc: Temporal  BMI  (Calculated): 30.53     Physical exam documentation is limited by delayed entry of information.

## 2020-04-21 ENCOUNTER — Other Ambulatory Visit: Payer: Self-pay

## 2020-04-21 MED ORDER — CARVEDILOL 12.5 MG PO TABS: 12.5000 mg | ORAL_TABLET | Freq: Two times a day (BID) | ORAL | 5 refills | Status: DC

## 2020-05-01 ENCOUNTER — Emergency Department: Payer: Medicare Other

## 2020-05-01 ENCOUNTER — Emergency Department
Admission: EM | Admit: 2020-05-01 | Discharge: 2020-05-02 | Disposition: A | Discharge status: Home / Self Care | Payer: Medicare Other | Attending: Emergency Medicine | Admitting: Emergency Medicine

## 2020-05-01 ENCOUNTER — Encounter: Payer: Self-pay | Admitting: Emergency Medicine

## 2020-05-01 ENCOUNTER — Other Ambulatory Visit: Payer: Self-pay

## 2020-05-01 DIAGNOSIS — I2510 Atherosclerotic heart disease of native coronary artery without angina pectoris: Secondary | ICD-10-CM | POA: Insufficient documentation

## 2020-05-01 DIAGNOSIS — E119 Type 2 diabetes mellitus without complications: Secondary | ICD-10-CM | POA: Diagnosis not present

## 2020-05-01 DIAGNOSIS — Z7982 Long term (current) use of aspirin: Secondary | ICD-10-CM | POA: Insufficient documentation

## 2020-05-01 DIAGNOSIS — Z20822 Contact with and (suspected) exposure to covid-19: Secondary | ICD-10-CM | POA: Diagnosis not present

## 2020-05-01 DIAGNOSIS — F17210 Nicotine dependence, cigarettes, uncomplicated: Secondary | ICD-10-CM | POA: Diagnosis not present

## 2020-05-01 DIAGNOSIS — Z955 Presence of coronary angioplasty implant and graft: Secondary | ICD-10-CM | POA: Diagnosis not present

## 2020-05-01 DIAGNOSIS — Z79899 Other long term (current) drug therapy: Secondary | ICD-10-CM | POA: Diagnosis not present

## 2020-05-01 DIAGNOSIS — R05 Cough: Secondary | ICD-10-CM | POA: Diagnosis present

## 2020-05-01 DIAGNOSIS — F17290 Nicotine dependence, other tobacco product, uncomplicated: Secondary | ICD-10-CM | POA: Insufficient documentation

## 2020-05-01 DIAGNOSIS — J40 Bronchitis, not specified as acute or chronic: Secondary | ICD-10-CM | POA: Diagnosis not present

## 2020-05-01 DIAGNOSIS — I10 Essential (primary) hypertension: Secondary | ICD-10-CM | POA: Diagnosis not present

## 2020-05-01 LAB — BASIC METABOLIC PANEL
Anion gap: 12 (ref 5–15)
BUN: 12 mg/dL (ref 8–23)
CO2: 24 mmol/L (ref 22–32)
Calcium: 9 mg/dL (ref 8.9–10.3)
Chloride: 99 mmol/L (ref 98–111)
Creatinine, Ser: 1.3 mg/dL — ABNORMAL HIGH (ref 0.44–1.00)
GFR calc Af Amer: 51 mL/min — ABNORMAL LOW (ref >60)
GFR calc non Af Amer: 44 mL/min — ABNORMAL LOW (ref >60)
Glucose, Bld: 176 mg/dL — ABNORMAL HIGH (ref 70–99)
Potassium: 3.2 mmol/L — ABNORMAL LOW (ref 3.5–5.1)
Sodium: 135 mmol/L (ref 135–145)

## 2020-05-01 LAB — CBC
HCT: 40.8 % (ref 36.0–46.0)
Hemoglobin: 13.4 g/dL (ref 12.0–15.0)
MCH: 31.1 pg (ref 26.0–34.0)
MCHC: 32.8 g/dL (ref 30.0–36.0)
MCV: 94.7 fL (ref 80.0–100.0)
Platelets: 319 10*3/uL (ref 150–400)
RBC: 4.31 MIL/uL (ref 3.87–5.11)
RDW: 12.9 % (ref 11.5–15.5)
WBC: 10.4 10*3/uL (ref 4.0–10.5)
nRBC: 0 % (ref 0.0–0.2)

## 2020-05-01 LAB — TROPONIN I (HIGH SENSITIVITY)
Troponin I (High Sensitivity): 11 ng/L (ref <18)
Troponin I (High Sensitivity): 8 ng/L (ref <18)

## 2020-05-01 MED ORDER — SODIUM CHLORIDE 0.9% FLUSH
3.0000 mL | Freq: Once | INTRAVENOUS | Status: AC
  Administered 2020-05-02: 3 mL via INTRAVENOUS

## 2020-05-01 MED ORDER — IBUPROFEN 400 MG PO TABS
400.0000 mg | ORAL_TABLET | Freq: Once | ORAL | Status: AC | PRN
  Administered 2020-05-01: 400 mg via ORAL
  Filled 2020-05-01: qty 1

## 2020-05-01 MED ORDER — ONDANSETRON 4 MG PO TBDP
4.0000 mg | ORAL_TABLET | Freq: Once | ORAL | Status: AC
  Administered 2020-05-01: 4 mg via ORAL
  Filled 2020-05-01: qty 1

## 2020-05-01 MED ORDER — ACETAMINOPHEN 325 MG PO TABS
650.0000 mg | ORAL_TABLET | Freq: Once | ORAL | Status: AC
  Administered 2020-05-01: 650 mg via ORAL
  Filled 2020-05-01: qty 2

## 2020-05-01 NOTE — ED Triage Notes (Signed)
PT to ER with c/o chest pain.  States started with a dry cough about a week ago that has become more "loose".  States chest pain started several days ago and is worse with cough.  PT also states chest burns with cough.  PT with noted emesis in triage, states this started this AM.  Pt has hx of bypass surgery in 2020.

## 2020-05-01 NOTE — ED Provider Notes (Signed)
[facility] Emergency Department Provider Note   ____________________________________________   First MD Initiated Contact with Patient 05/01/20 2354     (approximate)  I have reviewed the triage vital signs and the nursing notes.   HISTORY  Chief Complaint Chest Pain    HPI Caitlyn Herrera is a 59 y.o. female who presents to the ED from home with a chief complaint of cough and chest pain.  Patient reports cough x1 week which started off dry but now is loose and yellow in color.  Reports chest pain only on coughing.  Denies associated diaphoresis, shortness of breath, nausea/vomiting, abdominal pain or dizziness.  Reports fever.  Denies COVID-19 exposure.  Has had her first Covid vaccination.  Denies recent travel or trauma.  Denies hormone use.       Past Medical History:  Diagnosis Date  . Abdominal aortic ectasia (HCC)    a. 11/2017 CTA chest/abd/pelvis: 2.5 cm abd ao ectasia -->rec f/u u/s in 5 yrs.  [name] CAD (coronary artery disease)    a. 11/04/17 Cath: Native multivessel dzs-->CABG x 3 (LIMA->LAD, VG->OM2, VG->RPDA; b. 11/2017 MV: mid antlat/apical isch; c. 11/2017 Cath: LM 30d, LAD 67m, LCX 80p/m, OM1 90, OM2 80 (@ anastamosis of graft), RCA 80p, 165m, 90d, LIMA->LAD nl, VG->OM2 nl, VG->RPDA nl-->Med Rx.  . Carotid arterial disease (HCC)    a. 10/2017 Carotid U/S: 1-30% bilat ICA stenosis.  [name] History of echocardiogram    a. 11/04/2017 Echo: EF of 65-70%, no RWMA, nl LV diastolic fxn, nl RV size/fxn, mild TR; b. 03/2018 Echo: EF 60-65%, mild LVH, no rwma, mild MR. Mildy reduced RV fxn. Mild TR.  [name] History of Tobacco abuse   . Hyperlipidemia   . Hypertension   . Left renal artery stenosis (HCC)    a. 11/2017 CTA Chest/Abd/Pelvis: 50-70% L RA stenosis.  . Palpitations    a. 02/2018 Event Monitor: wore for 2 days, 21 hrs - rare PAC's/PVC's. Avg HR 66 (42-103); b. 04/2018 Event Monitor: Wore for 14 days. RSR, 67 (49-111). No significant arrhythmias.  .  Persistent cough   . Pulmonary nodule, right    a. 10/2017 CT Chest: 10mm pulm nodule in R lung apex - rec f/u w/ non-contrast chest CT in 1 year.  . Type II diabetes mellitus (HCC)    a. 10/2017 A1c 6.6.    Patient Active Problem List   Diagnosis Date Noted  . Coronary artery disease involving native coronary artery of native heart without angina pectoris 02/22/2019  . Chronic cough 05/28/2018  . Shortness of breath 03/26/2018  . Essential hypertension 03/26/2018  . Palpitations 03/26/2018  . Chest pressure 12/08/2017  . Unstable angina (HCC) 12/08/2017  . Angina at rest [facility])   . Hyperlipidemia LDL goal <70   . S/P CABG (coronary artery bypass graft) 11/12/2017  . Non-insulin dependent type 2 diabetes mellitus (HCC) 11/10/2017  . 3-vessel CAD 11/10/2017  . HCVD (hypertensive cardiovascular disease) 11/10/2017  . UTI (urinary tract infection) 11/10/2017  . Pulmonary nodule, right 11/10/2017  . Dyslipidemia 11/07/2017  . Tobacco abuse   . NSTEMI (non-ST elevated myocardial infarction) (HCC) 11/05/2017    Past Surgical History:  Procedure Laterality Date  . BREAST BIOPSY Right 06/23/2018   US guided biopsy - awaiting pathology  . BREAST CYST EXCISION Right   . CARDIAC CATHETERIZATION    . CORONARY ARTERY BYPASS GRAFT N/A 11/12/2017   Procedure: CORONARY ARTERY BYPASS GRAFTING (CABG) x Three , using left internal mammary artery  and right leg greater saphenous vein harvested endoscopically;  Surgeon: Kerin Perna, MD;  Location: [facility] OR;  Service: Open Heart Surgery;  Laterality: N/A;  . DILATION AND CURETTAGE OF UTERUS    . LAPAROSCOPIC CHOLECYSTECTOMY    . LEFT HEART CATH AND CORONARY ANGIOGRAPHY N/A 11/04/2017   Procedure: LEFT HEART CATH AND CORONARY ANGIOGRAPHY;  Surgeon: Yvonne Kendall, MD;  Location: [facility] INVASIVE CV LAB;  Service: Cardiovascular;  Laterality: N/A;  . LEFT HEART CATH AND CORS/GRAFTS ANGIOGRAPHY N/A 12/09/2017   Procedure: LEFT HEART CATH AND CORS/GRAFTS  ANGIOGRAPHY;  Surgeon: [name], Peter M, MD;  Location: [facility] INVASIVE CV LAB;  Service: Cardiovascular;  Laterality: N/A;  . TEE WITHOUT CARDIOVERSION N/A 11/12/2017   Procedure: TRANSESOPHAGEAL ECHOCARDIOGRAM (TEE);  Surgeon: Donata Clay, Theron Arista, MD;  Location: [facility] OR;  Service: Open Heart Surgery;  Laterality: N/A;    Prior to Admission medications   Medication Sig Start Date End Date Taking? Authorizing Provider  albuterol (PROVENTIL HFA;VENTOLIN HFA) 108 (90 Base) MCG/ACT inhaler Inhale 2 puffs into the lungs every 6 (six) hours as needed for wheezing or shortness of breath. 03/12/18   Kerin Perna, MD  aspirin EC 81 MG tablet Take 1 tablet (81 mg total) by mouth daily. 03/26/18   End, Cristal Deer, MD  atorvastatin (LIPITOR) 80 MG tablet Take 1 tablet (80 mg total) by mouth daily at 6 PM. 04/11/20   End, Cristal Deer, MD  azelastine (ASTELIN) 0.1 % nasal spray INSTILL 1 SPRAY INTO EACH NOSTRIL PRN FOR ALLERGIES AND DRAINAGE 11/05/18   [provider]  benzonatate (TESSALON) 100 MG capsule Take 100 mg by mouth as needed.  09/22/18   [provider]  carvedilol (COREG) 12.5 MG tablet Take 1 tablet (12.5 mg total) by mouth 2 (two) times daily. 04/21/20   End, Cristal Deer, MD  cetirizine (ZYRTEC) 10 MG tablet cetirizine 10 mg tablet  take 1 tablet by mouth once daily    [provider]  chlorpheniramine-HYDROcodone (TUSSIONEX PENNKINETIC ER) 10-8 MG/5ML SUER Take 5 mLs by mouth 2 (two) times daily. 05/02/20   Irean Hong, MD  citalopram (CELEXA) 20 MG tablet Take 20 mg by mouth daily.    [provider]  cyclobenzaprine (FLEXERIL) 10 MG tablet TK 1 T PO TID 11/03/18   [provider]  dextromethorphan-guaiFENesin (ROBITUSSIN-DM) 10-100 MG/5ML liquid Tussin DM 10 mg-100 mg/5 mL oral syrup  take 5 milliliters by mouth every 6 hours if needed for cough    [provider]  ezetimibe (ZETIA) 10 MG tablet Take 1 tablet (10 mg total) by mouth daily. 05/26/19 03/02/20   End, Cristal Deer, MD  fluticasone (FLONASE) 50 MCG/ACT nasal spray SHAKE LQ AND U 2 SPRAYS IEN QD 01/06/19   [provider]  Fluticasone-Salmeterol (ADVAIR DISKUS) 250-50 MCG/DOSE AEPB Inhale 1 puff into the lungs 2 (two) times daily. 11/26/19 11/25/20  Salena Saner, MD  hydrochlorothiazide (HYDRODIURIL) 25 MG tablet Take 1 tablet (25 mg total) by mouth daily. 03/07/20 09/03/20  End, Cristal Deer, MD  isosorbide mononitrate (IMDUR) 30 MG 24 hr tablet Take 0.5 tablets (15 mg total) by mouth at bedtime. 03/02/20   Alver Sorrow, NP  lidocaine (XYLOCAINE) 5 % ointment Apply 1 application topically daily as needed.    [provider]  losartan (COZAAR) 100 MG tablet Take 100 mg by mouth daily.    [provider]  omeprazole (PRILOSEC) 40 MG capsule TAKE 1 CAPSULE BY MOUTH EVERY DAY BEFORE BREAKFAST 03/29/19   Vanga, Loel Dubonnet,  MD  ondansetron (ZOFRAN) 4 MG tablet Take 1 tablet (4 mg total) by mouth every 8 (eight) hours as needed for nausea or vomiting. 11/28/17   Kerin Perna, MD  oxybutynin (DITROPAN-XL) 5 MG 24 hr tablet TK 1T PO QD FOR INCONTINENCE 03/04/19   [provider]  predniSONE (DELTASONE) 20 MG tablet 3 tablets daily x 4 days 05/02/20   Irean Hong, MD  SPIRIVA HANDIHALER 18 MCG inhalation capsule PLACE 1 CAPSULE IN [facility] AND INHALE EVERY DAY 03/17/20   Salena Saner, MD  traMADol (ULTRAM) 50 MG tablet TK 1 T PO Q 6 H 12/31/18   [provider]    Allergies Patient has no known allergies.  Family History  Problem Relation Age of Onset  . Hypertension Mother   . Heart Problems Mother   . Breast cancer Neg Hx     Social History Social History   Tobacco Use  . Smoking status: Current Every Day Smoker    Packs/day: 1.00    Years: 42.00    Pack years: 42.00    Types: Cigarettes, Cigars    Last attempt to quit: 11/01/2017    Years since quitting: 2.5  . Smokeless tobacco: Never Used  . Tobacco comment: 11/05/2017 "stopped  smoking 11/01/2017"  Vaping Use  . Vaping Use: Never used  Substance Use Topics  . Alcohol use: No  . Drug use: No    Review of Systems  Constitutional: Positive for fever Eyes: No visual changes. ENT: No sore throat. Cardiovascular: Positive for chest pain. Respiratory: Positive for cough.  Denies shortness of breath. Gastrointestinal: No abdominal pain.  No nausea, no vomiting.  No diarrhea.  No constipation. Genitourinary: Negative for dysuria. Musculoskeletal: Negative for back pain. Skin: Negative for rash. Neurological: Negative for headaches, focal weakness or numbness.   ____________________________________________   PHYSICAL EXAM:  VITAL SIGNS: ED Triage Vitals  Enc Vitals Group     BP 05/01/20 1816 123/83     Pulse Rate 05/01/20 1816 86     Resp 05/01/20 1816 18     Temp 05/01/20 1816 (!) 102.3 F (39.1 C)     Temp Source 05/01/20 1816 Oral     SpO2 05/01/20 1816 98 %     Weight 05/01/20 1813 189 lb (85.7 kg)     Height 05/01/20 1813 5\' 6"  (1.676 m)     Head Circumference --      Peak Flow --      Pain Score 05/01/20 1812 9     Pain Loc --      Pain Edu? --      Excl. in GC? --     Constitutional: Asleep, awakened for exam.  Alert and oriented. Well appearing and in no acute distress. Eyes: Conjunctivae are normal. PERRL. EOMI. Head: Atraumatic. Nose: No congestion/rhinnorhea. Mouth/Throat: Mucous membranes are moist.   Neck: No stridor.  Supple neck without meningismus. Cardiovascular: Normal rate, regular rhythm. Grossly normal heart sounds.  Good peripheral circulation. Respiratory: Normal respiratory effort.  No retractions. Lungs CTAB.  Loose cough noted.  Anterior chest mildly tender to palpation. Gastrointestinal: Soft and nontender. No distention. No abdominal bruits. No CVA tenderness. Musculoskeletal: No lower extremity tenderness nor edema.  No joint effusions. Neurologic:  Normal speech and language. No gross focal neurologic deficits are  appreciated. No gait instability. Skin:  Skin is warm, dry and intact. No rash noted.  No petechiae. Psychiatric: Mood and affect are normal. Speech and behavior are normal.  ____________________________________________   LABS (all labs ordered are listed, but only abnormal results are displayed)  Labs Reviewed  BASIC METABOLIC PANEL - Abnormal; Notable for the following components:      Result Value   Potassium 3.2 (*)    Glucose, Bld 176 (*)    Creatinine, Ser 1.30 (*)    GFR calc non Af Amer 44 (*)    GFR calc Af Amer 51 (*)    All other components within normal limits  SARS CORONAVIRUS 2 (TAT 6-24 HRS)  CULTURE, BLOOD (ROUTINE X 2)  CULTURE, BLOOD (ROUTINE X 2)  CBC  LACTIC ACID, PLASMA  LACTIC ACID, PLASMA  TROPONIN I (HIGH SENSITIVITY)  TROPONIN I (HIGH SENSITIVITY)   ____________________________________________  EKG  ED ECG REPORT I, Pritesh Sobecki J, the attending physician, personally viewed and interpreted this ECG.   Date: 05/02/2020  EKG Time: 1806  Rate: 89  Rhythm: normal EKG, normal sinus rhythm  Axis: Normal  Intervals:none  ST&T Change: Nonspecific No significant change from 03/02/2020 ____________________________________________  RADIOLOGY  ED MD interpretation: No acute cardiopulmonary process  Official radiology report(s): DG Chest 2 View  Result Date: 05/01/2020 CLINICAL DATA:  Chest pain. EXAM: CHEST - 2 VIEW COMPARISON:  January 21, 2018 FINDINGS: Multiple sternal wires are seen. There is no evidence of acute infiltrate, pleural effusion or pneumothorax. The heart size and mediastinal contours are within normal limits. The visualized skeletal structures are unremarkable. Radiopaque surgical clips are seen overlying the right upper quadrant. IMPRESSION: No active cardiopulmonary disease. Electronically Signed   By: Aram Candelahaddeus  Houston M.D.   On: 05/01/2020 19:04    ____________________________________________   PROCEDURES  Procedure(s) performed  (including Critical Care):  Procedures   ____________________________________________   INITIAL IMPRESSION / ASSESSMENT AND PLAN / ED COURSE  As part of my medical decision making, I reviewed the following data within the electronic MEDICAL RECORD NUMBER Nursing notes reviewed and incorporated, Labs reviewed, EKG interpreted, Old chart reviewed, Radiograph reviewed and Notes from prior ED visits     Caitlyn Herrera was evaluated in Emergency Department on 05/02/2020 for the symptoms described in the history of present illness. She was evaluated in the context of the global COVID-19 pandemic, which necessitated consideration that the patient might be at risk for infection with the SARS-CoV-2 virus that causes COVID-19. Institutional protocols and algorithms that pertain to the evaluation of patients at risk for COVID-19 are in a state of rapid change based on information released by regulatory bodies including the CDC and federal and state organizations. These policies and algorithms were followed during the patient's care in the ED.    [age] year old female presenting with cough and chest pain. Differential diagnosis includes, but is not limited to, ACS, aortic dissection, pulmonary embolism, cardiac tamponade, pneumothorax, pneumonia, pericarditis, myocarditis, GI-related causes including esophagitis/gastritis, and musculoskeletal chest wall pain.    Patient has 2 sets of negative troponins.  Mild AKI.  Mild hypokalemia.  Chest x-ray negative for pneumonia.  Patient reports wheezing with coughing; will start prednisone and Tussionex as needed for cough.  Patient noted to have fever in triage.  Will obtain blood cultures and lactic acid.   Clinical Course as of May 02 338  Tue May 02, 2020  0210 Lactic acid is unremarkable.  Will discharge home on prednisone, Tussionex and patient will follow up closely with her PCP.  Strict return precautions given.  Patient verbalizes understanding and agrees with  plan of care.   [JS]    Clinical Course  User Index [JS] Irean Hong, MD     ____________________________________________   FINAL CLINICAL IMPRESSION(S) / ED DIAGNOSES  Final diagnoses:  Bronchitis     ED Discharge Orders         Ordered    predniSONE (DELTASONE) 20 MG tablet     Discontinue  Reprint     05/02/20 0050    chlorpheniramine-HYDROcodone (TUSSIONEX PENNKINETIC ER) 10-8 MG/5ML SUER  2 times daily     Discontinue  Reprint     05/02/20 0050           Note:  This document was prepared using Dragon voice recognition software and may include unintentional dictation errors.   Irean Hong, MD 05/02/20 [phone]

## 2020-05-02 DIAGNOSIS — J40 Bronchitis, not specified as acute or chronic: Secondary | ICD-10-CM | POA: Diagnosis not present

## 2020-05-02 LAB — SARS CORONAVIRUS 2 (TAT 6-24 HRS): SARS Coronavirus 2: NEGATIVE

## 2020-05-02 LAB — LACTIC ACID, PLASMA: Lactic Acid, Venous: 1 mmol/L (ref 0.5–1.9)

## 2020-05-02 MED ORDER — PREDNISONE 20 MG PO TABS: ORAL_TABLET | ORAL | 0 refills | Status: DC

## 2020-05-02 MED ORDER — HYDROCOD POLST-CPM POLST ER 10-8 MG/5ML PO SUER
5.0000 mL | Freq: Once | ORAL | Status: AC
  Administered 2020-05-02: 5 mL via ORAL
  Filled 2020-05-02: qty 5

## 2020-05-02 MED ORDER — HYDROCOD POLST-CPM POLST ER 10-8 MG/5ML PO SUER: 5.0000 mL | Freq: Two times a day (BID) | ORAL | 0 refills | Status: DC

## 2020-05-02 MED ORDER — PREDNISONE 20 MG PO TABS
60.0000 mg | ORAL_TABLET | Freq: Once | ORAL | Status: AC
  Administered 2020-05-02: 60 mg via ORAL
  Filled 2020-05-02: qty 3

## 2020-05-02 MED ORDER — ACETAMINOPHEN 500 MG PO TABS
1000.0000 mg | ORAL_TABLET | Freq: Once | ORAL | Status: AC
  Administered 2020-05-02: 1000 mg via ORAL
  Filled 2020-05-02: qty 2

## 2020-05-02 NOTE — Discharge Instructions (Addendum)
1.  Finish prednisone 60 mg daily x4 days.  Start your next dose Wednesday morning. 2.  You may take cough medicine as needed (Tussionex). 3.  You will be called with any positive COVID-19 results.  You may also find the results in Mychart in 1-2 days. 4.  Return to the ER for worsening symptoms, persistent vomiting, difficulty breathing or other concerns.

## 2020-05-02 NOTE — ED Notes (Signed)
E signature pad broken. Pt educated on discharge instructions and verbalized understanding.   

## 2020-05-07 LAB — CULTURE, BLOOD (ROUTINE X 2)
Culture: NO GROWTH
Culture: NO GROWTH

## 2020-05-08 NOTE — Progress Notes (Signed)
Virtual Visit via Telephone Note   This visit type was conducted due to national recommendations for restrictions regarding the COVID-19 Pandemic (e.g. social distancing) in an effort to limit this patient's exposure and mitigate transmission in our community.  Due to her co-morbid illnesses, this patient is at least at moderate risk for complications without adequate follow up.  This format is felt to be most appropriate for this patient at this time.  The patient did not have access to video technology/had technical difficulties with video requiring transitioning to audio format only (telephone).  All issues noted in this document were discussed and addressed.  No physical exam could be performed with this format.  Please refer to the patient's chart for her  consent to telehealth for [facility].   The patient was identified using 2 identifiers.  Date:  05/12/2020   ID:  Caitlyn Herrera, DOB [birth year], MRN [id number]  Patient Location: Home Provider Location: Office/Clinic  PCP:  Caitlyn Herrera  Cardiologist:  Yvonne Kendall, MD  Electrophysiologist:  None   Evaluation Performed:  Follow-Up Visit  Chief Complaint:  Follow up  History of Present Illness:    Caitlyn Herrera is a 59 y.o. female with CAD s/p 3-vessel CABG in 10/2017, COPD secondary to tobacco use, DM2, HTN, HLD, and GERD who presents for follow up of her CAD.   She was admitted to the hospital in 10/2017 with an NSTEMI and underwent diagnostic LHC which revealed severe multivessel CAD.  Echo showed normal LV systolic function.  She subsequently underwent three-vessel CABG with LIMA to LAD, SVG to OM2, and SVG to RPDA.  She required repeat LHC in 11/2017 in the setting of subsequent admission with mild troponin elevation and abnormal stress testing which showed 3 of 3 patent grafts with native multivessel disease including 80% stenosis at the anastomosis of the SVG to OM2.  This area was not  felt to be amenable to PCI and she was medically managed.  Since her bypass, she has been evaluated multiple times for complaints of palpitations with cardiac monitoring being unrevealing in 02/2018 and 04/2018.  On both occasions, duration of monitor compliance was limited.  She is also noted an intermittent productive cough that has been present since her CABG.  She was placed on Protonix by ENT though did not tolerate this.  She was seen virtually in 08/2018 and referred to pulmonology with symptoms felt to be mostly reflux in etiology.  She has been evaluated by GI with recommendation to proceed with EGD and colonoscopy though these have not been completed.  She has been evaluated by the lipid clinic with recommendation to improve dietary lifestyle and repeat lipid testing with consideration for PCSK9 inhibitor if LDL remains above goal in follow-up.  She was most recently seen in the office in 02/2020 continuing to note shortness of breath and occasional palpitations.  Symptoms were largely unchanged when compared to symptoms noted since her CABG.  Imdur 15 mg daily was added.  Her cath from 11/2017 was reviewed by interventional cardiology and it was noted large grafts were anastomosed to very small diseased native vessels.  Her shortness of breath was felt to be multifactorial including underlying CAD, COPD, and GERD.  She was recently seen in the ED on 05/01/2020 for fever of 102.3, cough, and chest discomfort.  Blood cultures negative x2.  COVID-19 negative.  High-sensitivity troponin 11 with a delta of 8.  CBC unremarkable.  Hypokalemic with a potassium 3.2  with serum creatinine 1.3 and BUN 12.  Chest x-ray with no active cardiopulmonary disease.  EKG showed sinus tachycardia, 102 bpm, diffuse ST-T changes that were largely unchanged when compared to prior.  She was diagnosed with bronchitis, treated with prednisone and Tussionex with recommendation to follow-up with PCP.  We had difficulty connecting her to  video visit therefore her visit was done over the phone.  She is doing reasonably well from a cardiac perspective.  She notes an improvement in her angina/shortness of breath following the addition of Imdur.  However, she reports receiving her first Covid vaccine on 04/19/2020.  Approximately 1 week after that she developed a sore throat with chest congestion and has subsequently lost all sense of smell and taste.  She reports a prior T-max of 39 F with continued intermittent subjective evening fevers.  She has a poor appetite and in the setting has lost 11 pounds over the past couple of weeks.  She has subsequently been taken off HCTZ secondary to AKI noted in the ED.  She denies any symptoms of diarrhea.  She plans to follow-up with her PCP for potential repeat Covid testing next week as well as follow-up labs.  Outside of the above URI/Covid-like symptoms she is without complaints and feels like she is improved following the addition of Imdur.   Labs independently reviewed: 04/2020 - Hgb 13.4, PLT 319, potassium 3.2, BUN 12, serum creatinine 1.3 02/2020 - TC 113, TG 116, HDL 24, LDL 66, albumin 3.8, AST/ALT normal  The patient does have symptoms concerning for COVID-19 infection (fever, chills, cough, or new shortness of breath).    Past Medical History:  Diagnosis Date  . Abdominal aortic ectasia (HCC)    a. 11/2017 CTA chest/abd/pelvis: 2.5 cm abd ao ectasia -->rec f/u u/s in 5 yrs.  [name] CAD (coronary artery disease)    a. 11/04/17 Cath: Native multivessel dzs-->CABG x 3 (LIMA->LAD, VG->OM2, VG->RPDA; b. 11/2017 MV: mid antlat/apical isch; c. 11/2017 Cath: LM 30d, LAD 69m, LCX 80p/m, OM1 90, OM2 80 (@ anastamosis of graft), RCA 80p, 13m, 90d, LIMA->LAD nl, VG->OM2 nl, VG->RPDA nl-->Med Rx.  . Carotid arterial disease (HCC)    a. 10/2017 Carotid U/S: 1-30% bilat ICA stenosis.  [name] History of echocardiogram    a. 11/04/2017 Echo: EF of 65-70%, no RWMA, nl LV diastolic fxn, nl RV size/fxn, mild TR; b.  03/2018 Echo: EF 60-65%, mild LVH, no rwma, mild MR. Mildy reduced RV fxn. Mild TR.  [name] History of Tobacco abuse   . Hyperlipidemia   . Hypertension   . Left renal artery stenosis (HCC)    a. 11/2017 CTA Chest/Abd/Pelvis: 50-70% L RA stenosis.  . Palpitations    a. 02/2018 Event Monitor: wore for 2 days, 21 hrs - rare PAC's/PVC's. Avg HR 66 (42-103); b. 04/2018 Event Monitor: Wore for 14 days. RSR, 67 (49-111). No significant arrhythmias.  . Persistent cough   . Pulmonary nodule, right    a. 10/2017 CT Chest: 60mm pulm nodule in R lung apex - rec f/u w/ non-contrast chest CT in 1 year.  . Type II diabetes mellitus (HCC)    a. 10/2017 A1c 6.6.   Past Surgical History:  Procedure Laterality Date  . BREAST BIOPSY Right 06/23/2018   US guided biopsy - awaiting pathology  . BREAST CYST EXCISION Right   . CARDIAC CATHETERIZATION    . CORONARY ARTERY BYPASS GRAFT N/A 11/12/2017   Procedure: CORONARY ARTERY BYPASS GRAFTING (CABG) x Three , using left internal  mammary artery and right leg greater saphenous vein harvested endoscopically;  Surgeon: Kerin Perna, MD;  Location: [facility] OR;  Service: Open Heart Surgery;  Laterality: N/A;  . DILATION AND CURETTAGE OF UTERUS    . LAPAROSCOPIC CHOLECYSTECTOMY    . LEFT HEART CATH AND CORONARY ANGIOGRAPHY N/A 11/04/2017   Procedure: LEFT HEART CATH AND CORONARY ANGIOGRAPHY;  Surgeon: Yvonne Kendall, MD;  Location: [facility] INVASIVE CV LAB;  Service: Cardiovascular;  Laterality: N/A;  . LEFT HEART CATH AND CORS/GRAFTS ANGIOGRAPHY N/A 12/09/2017   Procedure: LEFT HEART CATH AND CORS/GRAFTS ANGIOGRAPHY;  Surgeon: [name], Peter M, MD;  Location: [facility] INVASIVE CV LAB;  Service: Cardiovascular;  Laterality: N/A;  . TEE WITHOUT CARDIOVERSION N/A 11/12/2017   Procedure: TRANSESOPHAGEAL ECHOCARDIOGRAM (TEE);  Surgeon: Donata Clay, Theron Arista, MD;  Location: [facility] OR;  Service: Open Heart Surgery;  Laterality: N/A;     Current Meds  Medication Sig  . albuterol (PROVENTIL HFA;VENTOLIN  HFA) 108 (90 Base) MCG/ACT inhaler Inhale 2 puffs into the lungs every 6 (six) hours as needed for wheezing or shortness of breath.  [name] aspirin EC 81 MG tablet Take 1 tablet (81 mg total) by mouth daily.  [name] atorvastatin (LIPITOR) 80 MG tablet Take 1 tablet (80 mg total) by mouth daily at 6 PM.  . azelastine (ASTELIN) 0.1 % nasal spray INSTILL 1 SPRAY INTO EACH NOSTRIL PRN FOR ALLERGIES AND DRAINAGE  . benzonatate (TESSALON) 100 MG capsule Take 100 mg by mouth as needed.   . cetirizine (ZYRTEC) 10 MG tablet cetirizine 10 mg tablet  take 1 tablet by mouth once daily  . chlorpheniramine-HYDROcodone (TUSSIONEX PENNKINETIC ER) 10-8 MG/5ML SUER Take 5 mLs by mouth 2 (two) times daily.  . citalopram (CELEXA) 20 MG tablet Take 20 mg by mouth daily.  . cyclobenzaprine (FLEXERIL) 10 MG tablet TK 1 T PO TID  . dextromethorphan-guaiFENesin (ROBITUSSIN-DM) 10-100 MG/5ML liquid Tussin DM 10 mg-100 mg/5 mL oral syrup  take 5 milliliters by mouth every 6 hours if needed for cough  . doxycycline (MONODOX) 100 MG capsule Take 100 mg by mouth 2 (two) times daily.  [name] ezetimibe (ZETIA) 10 MG tablet Take 1 tablet (10 mg total) by mouth daily.  . fluticasone (FLONASE) 50 MCG/ACT nasal spray SHAKE LQ AND U 2 SPRAYS IEN QD  . Fluticasone-Salmeterol (ADVAIR DISKUS) 250-50 MCG/DOSE AEPB Inhale 1 puff into the lungs 2 (two) times daily.  . isosorbide mononitrate (IMDUR) 30 MG 24 hr tablet Take 0.5 tablets (15 mg total) by mouth at bedtime.  . lidocaine (XYLOCAINE) 5 % ointment Apply 1 application topically daily as needed.  [name] losartan (COZAAR) 100 MG tablet Take 100 mg by mouth daily.  . metFORMIN (GLUCOPHAGE-XR) 500 MG 24 hr tablet Take 500 mg by mouth daily.  [name] omeprazole (PRILOSEC) 40 MG capsule TAKE 1 CAPSULE BY MOUTH EVERY DAY BEFORE BREAKFAST  . ondansetron (ZOFRAN) 4 MG tablet Take 1 tablet (4 mg total) by mouth every 8 (eight) hours as needed for nausea or vomiting.  [name] oxybutynin (DITROPAN-XL) 5 MG 24 hr tablet TK  1T PO QD FOR INCONTINENCE  . SPIRIVA HANDIHALER 18 MCG inhalation capsule PLACE 1 CAPSULE IN HANDIHALER AND INHALE EVERY DAY  . traMADol (ULTRAM) 50 MG tablet TK 1 T PO Q 6 H     Allergies:   Patient has no known allergies.   Social History   Tobacco Use  . Smoking status: Current Some Day Smoker    Packs/day: 1.00    Years: 42.00  Pack years: 42.00    Types: Cigarettes, Cigars    Last attempt to quit: 11/01/2017    Years since quitting: 2.5  . Smokeless tobacco: Never Used  . Tobacco comment: Patient started back in March of 2021.   Vaping Use  . Vaping Use: Never used  Substance Use Topics  . Alcohol use: No  . Drug use: No     Family Hx: The patient's family history includes Heart Problems in her mother; Hypertension in her mother. There is no history of Breast cancer.  ROS:   Please see the history of present illness.     All other systems reviewed and are negative.   Prior CV studies:   The following studies were reviewed today:  Zio patch 04/2018:  The patient was enrolled for 14 days. 20% of the monitoring period yielded diagnostic tracings.  The predominant rhythm was sinus with an average rate of 67 bpm (range 49-111 bpm).  No significant arrhythmia was observed.   Sinus rhythm without significant arrhythmias.  Study significant limited by short monitoring period (only 20% of monitoring period yielded diagnostic tracings). __________  2D echo 03/2018: - Left ventricle: The cavity size was at the upper limits of  normal. Wall thickness was increased in a pattern of mild LVH.  Systolic function was normal. The estimated ejection fraction was  in the range of 60% to 65%. Wall motion was normal; there were no  regional wall motion abnormalities. Left ventricular diastolic  function parameters were normal.  - Mitral valve: There was mild regurgitation.  - Right ventricle: The cavity size was normal. Systolic function  was mildly reduced.  -  Tricuspid valve: There was mild regurgitation. __________  Luci BankZio patch 02/2018:  The patient was monitored for 2 days, 21 hours.  The predominant rhythm was sinus with an average rate of 66 bpm (range 42-103).  Rare PAC's and PVC's were noted.  There were no sustained arrhythmias or prolonged pauses.   Sinus rhythm with rare PAC's and PVC's.  Brief monitoring period (<3 days). __________  LHC 11/2017:  Dist LM lesion is 30% stenosed.  Mid LAD lesion is 95% stenosed.  Lat 1st Mrg lesion is 90% stenosed.  Prox Cx to Mid Cx lesion is 80% stenosed.  2nd Mrg lesion is 80% stenosed.  Prox RCA lesion is 80% stenosed.  Mid RCA lesion is 100% stenosed.  Dist RCA lesion is 90% stenosed.  LIMA graft was visualized by angiography and is normal in caliber.  The graft exhibits no disease.  SVG graft was visualized by angiography and is normal in caliber.  The graft exhibits no disease.  SVG graft was visualized by angiography and is very large.  The graft exhibits no disease.  LV end diastolic pressure is normal.   1. Severe 3 vessel occlusive CAD.     - 95% mid LAD    - 90% first OM. This is a small caliber vessel and was not bypassed.    - 80-85% anastomotic lesion at the insertion of the SVG into OM 2.    - 100% mid RCA 2. Patent LIMA to the LAD 3. Patent SVG to OM 2 4. Patent SVG to PDA 5. Normal LVEDP.  Plan: continue medical therapy. Perhaps the Myoview findings could be explained base on disease in OM1. While there is an anastomotic lesion at the touch down of the SVG to OM2 there is a large graft to native vessel size differential that makes this unfavorable for PCI.  I think it is unlikely that this is the cause of her symptoms.   Labs/Other Tests and Data Reviewed:    EKG:  No ECG reviewed.  Recent Labs: 03/02/2020: ALT 14 05/01/2020: BUN 12; Creatinine, Ser 1.30; Hemoglobin 13.4; Platelets 319; Potassium 3.2; Sodium 135   Recent Lipid Panel Lab Results    Component Value Date/Time   CHOL 113 03/02/2020 04:23 PM   CHOL 103 12/17/2017 10:15 AM   TRIG 116 03/02/2020 04:23 PM   HDL 24 (L) 03/02/2020 04:23 PM   HDL 31 (L) 12/17/2017 10:15 AM   CHOLHDL 4.7 03/02/2020 04:23 PM   LDLCALC 66 03/02/2020 04:23 PM   LDLCALC 56 12/17/2017 10:15 AM    Wt Readings from Last 3 Encounters:  05/12/20 189 lb (85.7 kg)  05/01/20 189 lb (85.7 kg)  04/13/20 195 lb (88.5 kg)     Objective:    Vital Signs:  BP 121/90 (BP Location: Left Arm, Patient Position: Sitting, Cuff Size: Normal)   Pulse (!) 58   Ht 5\' 6"  (1.676 m)   Wt 189 lb (85.7 kg)   BMI 30.51 kg/m    VITAL SIGNS:  reviewed  ASSESSMENT & PLAN:    1. CAD s/p CABG with stable angina: She is doing well with stable to slightly improved symptoms following addition of Imdur.  In this setting, we will titrate her Imdur to 30 mg daily, she will otherwise continue aspirin, Lipitor, Zetia, and losartan.  She has reported chronic stable symptoms of angina and cough that date back to her bypass surgery.  Cath films were previously reviewed by interventional cardiology with continued medical management advised with noted large grafts being anastomosed into relatively small and diseased native vessels.  2. HTN: Blood pressure is well controlled at home today.  Continue losartan and Imdur.  Low-sodium diet.  3. HLD: LDL 66 from 02/2020 with normal LFT at that time.  Goal LDL less than 70.  She remains on atorvastatin and Zetia.  4. AKI: Patient's weight is down 11 pounds when compared to her last clinic weight.  Recent labs in the ED showed mild AKI with hypokalemia.  She has subsequently been taken off HCTZ.  Recommend she follow-up with PCP next week for follow-up labs, and a cost-effective manner.  5. Dyspnea: Longstanding issue and felt to be multifactorial including CAD, COPD, and GERD.  Update echo.  6. Chest congestion/loss of taste and smell: She reports symptoms began after getting her first  Covid vaccine.  Following this, she was tested for Covid, which was negative.  Recommend she follow-up with PCP for further evaluation and symptom management.  7. COPD: Follow-up with pulmonology.  COVID-19 Education: The signs and symptoms of COVID-19 were discussed with the patient and how to seek care for testing (follow up with PCP or arrange E-visit).  The importance of social distancing was discussed today.  Time:   Today, I have spent 10 minutes with the patient with telehealth technology discussing the above problems.     Medication Adjustments/Labs and Tests Ordered: Current medicines are reviewed at length with the patient today.  Concerns regarding medicines are outlined above.   Tests Ordered: No orders of the defined types were placed in this encounter.   Medication Changes: No orders of the defined types were placed in this encounter.   Follow Up:  In Person in 2 month(s)  Signed, 03/2020, PA-C  05/12/2020 1:27 PM    [facility]

## 2020-05-11 ENCOUNTER — Telehealth: Payer: Self-pay | Admitting: Physician Assistant

## 2020-05-11 NOTE — Telephone Encounter (Signed)
  Patient Consent for Virtual Visit         Caitlyn Herrera has provided verbal consent on 05/11/2020 for a virtual visit (video or telephone).   CONSENT FOR VIRTUAL VISIT FOR:  Caitlyn Herrera  By participating in this virtual visit I agree to the following:  I hereby voluntarily request, consent and authorize [facility] and its employed or contracted physicians, physician assistants, nurse practitioners or other licensed health care professionals (the Practitioner), to provide me with telemedicine health care services (the "Services") as deemed necessary by the treating Practitioner. I acknowledge and consent to receive the Services by the Practitioner via telemedicine. I understand that the telemedicine visit will involve communicating with the Practitioner through live audiovisual communication technology and the disclosure of certain medical information by electronic transmission. I acknowledge that I have been given the opportunity to request an in-person assessment or other available alternative prior to the telemedicine visit and am voluntarily participating in the telemedicine visit.  I understand that I have the right to withhold or withdraw my consent to the use of telemedicine in the course of my care at any time, without affecting my right to future care or treatment, and that the Practitioner or I may terminate the telemedicine visit at any time. I understand that I have the right to inspect all information obtained and/or recorded in the course of the telemedicine visit and may receive copies of available information for a reasonable fee.  I understand that some of the potential risks of receiving the Services via telemedicine include:  [name] Delay or interruption in medical evaluation due to technological equipment failure or disruption; . Information transmitted may not be sufficient (e.g. poor resolution of images) to allow for appropriate medical decision making by the  Practitioner; and/or  . In rare instances, security protocols could fail, causing a breach of personal health information.  Furthermore, I acknowledge that it is my responsibility to provide information about my medical history, conditions and care that is complete and accurate to the best of my ability. I acknowledge that Practitioner's advice, recommendations, and/or decision may be based on factors not within their control, such as incomplete or inaccurate data provided by me or distortions of diagnostic images or specimens that may result from electronic transmissions. I understand that the practice of medicine is not an exact science and that Practitioner makes no warranties or guarantees regarding treatment outcomes. I acknowledge that a copy of this consent can be made available to me via my patient portal [facility] MyChart), or I can request a printed copy by calling the office of [facility].    I understand that my insurance will be billed for this visit.   I have read or had this consent read to me. . I understand the contents of this consent, which adequately explains the benefits and risks of the Services being provided via telemedicine.  . I have been provided ample opportunity to ask questions regarding this consent and the Services and have had my questions answered to my satisfaction. . I give my informed consent for the services to be provided through the use of telemedicine in my medical care

## 2020-05-12 ENCOUNTER — Telehealth (INDEPENDENT_AMBULATORY_CARE_PROVIDER_SITE_OTHER): Payer: Medicare Other | Admitting: Physician Assistant

## 2020-05-12 ENCOUNTER — Encounter: Payer: Self-pay | Admitting: Physician Assistant

## 2020-05-12 ENCOUNTER — Other Ambulatory Visit: Payer: Self-pay

## 2020-05-12 VITALS — BP 121/90 | HR 58 | Ht 66.0 in | Wt 189.0 lb

## 2020-05-12 DIAGNOSIS — I10 Essential (primary) hypertension: Secondary | ICD-10-CM | POA: Diagnosis not present

## 2020-05-12 DIAGNOSIS — I2510 Atherosclerotic heart disease of native coronary artery without angina pectoris: Secondary | ICD-10-CM | POA: Diagnosis not present

## 2020-05-12 DIAGNOSIS — N179 Acute kidney failure, unspecified: Secondary | ICD-10-CM | POA: Diagnosis not present

## 2020-05-12 DIAGNOSIS — I25118 Atherosclerotic heart disease of native coronary artery with other forms of angina pectoris: Secondary | ICD-10-CM

## 2020-05-12 DIAGNOSIS — R0600 Dyspnea, unspecified: Secondary | ICD-10-CM

## 2020-05-12 DIAGNOSIS — R0609 Other forms of dyspnea: Secondary | ICD-10-CM

## 2020-05-12 DIAGNOSIS — E785 Hyperlipidemia, unspecified: Secondary | ICD-10-CM

## 2020-05-12 DIAGNOSIS — R0989 Other specified symptoms and signs involving the circulatory and respiratory systems: Secondary | ICD-10-CM

## 2020-05-12 DIAGNOSIS — R430 Anosmia: Secondary | ICD-10-CM

## 2020-05-12 DIAGNOSIS — R432 Parageusia: Secondary | ICD-10-CM

## 2020-05-12 MED ORDER — ISOSORBIDE MONONITRATE ER 30 MG PO TB24: 30.0000 mg | ORAL_TABLET | Freq: Every day | ORAL | 3 refills | Status: DC

## 2020-05-12 NOTE — Patient Instructions (Signed)
Medication Instructions:   Increase your IMDUR: Take 1 tablet (30 mg total) by mouth at bedtime  *If you need a refill on your cardiac medications before your next appointment, please call your pharmacy*   Lab Work: None Ordered If you have labs (blood work) drawn today and your tests are completely normal, you will receive your results only by: [name] MyChart Message (if you have MyChart) OR . A paper copy in the mail If you have any lab test that is abnormal or we need to change your treatment, we will call you to review the results.   Testing/Procedures:  Your physician has requested that you have an echocardiogram. Echocardiography is a painless test that uses sound waves to create images of your heart. It provides your doctor with information about the size and shape of your heart and how well your heart's chambers and valves are working. This procedure takes approximately one hour. There are no restrictions for this procedure.    Follow-Up: At [facility], you and your health needs are our priority.  As part of our continuing mission to provide you with exceptional heart care, we have created designated Provider Care Teams.  These Care Teams include your primary Cardiologist (physician) and Advanced Practice Providers (APPs -  Physician Assistants and Nurse Practitioners) who all work together to provide you with the care you need, when you need it.  We recommend signing up for the patient portal called "MyChart".  Sign up information is provided on this After Visit Summary.  MyChart is used to connect with patients for Virtual Visits (Telemedicine).  Patients are able to view lab/test results, encounter notes, upcoming appointments, etc.  Non-urgent messages can be sent to your provider as well.   To learn more about what you can do with MyChart, go to [email or web address].    Your next appointment:   2 month(s)  The format for your next appointment:   In Person  Provider:     You may see Yvonne Kendall, MD or one of the following Advanced Practice Providers on your designated Care Team:    Nicolasa Ducking, NP  Eula Listen, PA-C  Marisue Ivan, PA-C  Gillian Shields, NP    Other Instructions N/A

## 2020-06-07 ENCOUNTER — Other Ambulatory Visit: Payer: Self-pay | Admitting: Physician Assistant

## 2020-06-07 DIAGNOSIS — R0600 Dyspnea, unspecified: Secondary | ICD-10-CM

## 2020-06-07 DIAGNOSIS — R0609 Other forms of dyspnea: Secondary | ICD-10-CM

## 2020-06-09 ENCOUNTER — Other Ambulatory Visit: Payer: Self-pay | Admitting: Internal Medicine

## 2020-06-15 ENCOUNTER — Ambulatory Visit (INDEPENDENT_AMBULATORY_CARE_PROVIDER_SITE_OTHER): Payer: Medicare Other

## 2020-06-15 ENCOUNTER — Other Ambulatory Visit: Payer: Self-pay

## 2020-06-15 ENCOUNTER — Ambulatory Visit: Payer: Medicare Other | Admitting: Internal Medicine

## 2020-06-15 DIAGNOSIS — R0600 Dyspnea, unspecified: Secondary | ICD-10-CM

## 2020-06-15 DIAGNOSIS — R0609 Other forms of dyspnea: Secondary | ICD-10-CM

## 2020-06-15 LAB — ECHOCARDIOGRAM COMPLETE
AR max vel: 1.87 cm2
AV Area VTI: 2.17 cm2
AV Area mean vel: 2.1 cm2
AV Mean grad: 4 mmHg
AV Peak grad: 10.1 mmHg
Ao pk vel: 1.59 m/s
Area-P 1/2: 3.72 cm2
Calc EF: 57.9 %
S' Lateral: 2.5 cm
Single Plane A2C EF: 52 %
Single Plane A4C EF: 62.3 %

## 2020-06-16 ENCOUNTER — Telehealth: Payer: Self-pay

## 2020-06-16 NOTE — Telephone Encounter (Signed)
-----   Message from Sondra Barges, PA-C sent at 06/16/2020  7:06 AM EDT ----- Echo showed normal pump function, normal wall motion, mild thickening of the heart, normal relaxation of the heart, mildly elevated pressure along the right-sided heart (though not elevated enough to necessitate therapy), and a mildly leaky mitral valve.  Overall, largely unchanged from prior and reassuring.  Follow-up as planned next month.

## 2020-06-16 NOTE — Telephone Encounter (Signed)
Call to patient to review labs.  °  °Pt verbalized understanding and has no further questions at this time.  °  °Advised pt to call for any further questions or concerns.  °No further orders.  °Confirmed upcoming appt next month. °

## 2020-06-23 ENCOUNTER — Other Ambulatory Visit: Payer: Self-pay | Admitting: Pulmonary Disease

## 2020-06-23 ENCOUNTER — Other Ambulatory Visit: Payer: Self-pay | Admitting: Internal Medicine

## 2020-07-17 NOTE — Progress Notes (Signed)
Cardiology Office Note    Date:  07/24/2020   ID:  Caitlyn Herrera, Caitlyn Herrera 1961-08-08, MRN [id number]  PCP:  [name]  Cardiologist:  Yvonne Kendall, MD  Electrophysiologist:  None   Chief Complaint: Follow up  History of Present Illness:   Caitlyn Herrera is a 59 y.o. female with history of CAD s/p 3-vessel CABG in 10/2017, mild pulmonary hypertension, COPD secondary to tobacco use, DM2, HTN, HLD, intermittent medical adherence, and GERD who presents for follow up of her CAD.   She was admitted to the hospital in 10/2017 with an NSTEMI and underwent diagnostic LHC which revealed severe multivessel CAD.  Echo showed normal LV systolic function.  She subsequently underwent three-vessel CABG with LIMA to LAD, SVG to OM2, and SVG to RPDA.  She required repeat LHC in 11/2017 in the setting of subsequent admission with mild troponin elevation and abnormal stress testing which showed 3 of 3 patent grafts with native multivessel disease including 80% stenosis at the anastomosis of the SVG to OM2.  This area was not felt to be amenable to PCI and she was medically managed.  Since her bypass, she has been evaluated multiple times for complaints of palpitations with cardiac monitoring being unrevealing in 02/2018 and 04/2018.  On both occasions, duration of monitor compliance was limited.  She has also noted an intermittent productive cough that has been present since her CABG.  She was placed on Protonix by ENT though did not tolerate this.  She was seen virtually in 08/2018 and referred to pulmonology with symptoms felt to be mostly reflux in etiology.  She has been evaluated by GI with recommendation to proceed with EGD and colonoscopy though these have not been completed.  She has been evaluated by the lipid clinic with recommendation to improve dietary lifestyle and repeat lipid testing with consideration for PCSK9 inhibitor if LDL remained above goal, though was lost to  follow up.  She was seen in the office in 02/2020 continuing to note shortness of breath and occasional palpitations.  Symptoms were largely unchanged when compared to symptoms noted since her CABG.  Imdur 15 mg daily was added.  Her cath from 11/2017 was reviewed by interventional cardiology and it was noted large grafts were anastomosed to very small diseased native vessels.  Her shortness of breath was felt to be multifactorial including underlying CAD, COPD, and GERD.  She was seen in the ED on 05/01/2020 for fever of 102.3, cough, and chest discomfort.  Blood cultures negative x2.  COVID-19 negative.  High-sensitivity troponin 11 with a delta of 8.  Chest x-ray with no active cardiopulmonary disease.  EKG showed sinus tachycardia, 102 bpm, diffuse ST-T changes that were largely unchanged when compared to prior.  She was diagnosed with bronchitis, treated with prednisone and Tussionex.  She was last seen virtually in 04/2020, and was doing reasonably well from a cardiac perspective.  He chest pain/SOB had improved with the addition of Imdur.  She was no longer on HCTZ in the setting of poor PO intake with weight loss and AKI in the context of her URI.  Due to her longstanding dyspnea she underwent echo in 05/2020 which demonstrated an EF of 60 to 65%, no regional wall motion abnormalities, mild LVH, normal LV diastolic function parameters, normal RV systolic function and ventricular cavity size, mildly elevated PASP at 37.3 mmHg, and mild mitral regurgitation.  She comes in doing well from a cardiac perspective.  She  denies any chest pain, dyspnea, palpitations, dizziness, presyncope, syncope, lower extremity swelling, abdominal distention, orthopnea, PND, or early satiety.  She also notes her cough is significantly improved.  She does feel like the recently added Imdur has helped her quite a bit.  Otherwise, she is tolerating all of her cardiac medications.  No falls, hematochezia, or melena.  She does continue  to smoke approximately 5 cigarettes/day and intermittently uses nicotine patches/gum to supplement.  She is considering quitting completely.  She does not have any issues or concerns at this time.  She has received both of her COVID-19 vaccines.    Labs independently reviewed: 04/2020 - Hgb 13.4, PLT 319, potassium 3.2, BUN 12, serum creatinine 1.3 02/2020 - TC 113, TG 116, HDL 24, LDL 66, albumin 3.8, AST/ALT normal.    Past Medical History:  Diagnosis Date  . Abdominal aortic ectasia (HCC)    a. 11/2017 CTA chest/abd/pelvis: 2.5 cm abd ao ectasia -->rec f/u u/s in 5 yrs.  [name] CAD (coronary artery disease)    a. 11/04/17 Cath: Native multivessel dzs-->CABG x 3 (LIMA->LAD, VG->OM2, VG->RPDA; b. 11/2017 MV: mid antlat/apical isch; c. 11/2017 Cath: LM 30d, LAD 85m, LCX 80p/m, OM1 90, OM2 80 (@ anastamosis of graft), RCA 80p, 14m, 90d, LIMA->LAD nl, VG->OM2 nl, VG->RPDA nl-->Med Rx.  . Carotid arterial disease (HCC)    a. 10/2017 Carotid U/S: 1-30% bilat ICA stenosis.  [name] History of echocardiogram    a. 11/04/2017 Echo: EF of 65-70%, no RWMA, nl LV diastolic fxn, nl RV size/fxn, mild TR; b. 03/2018 Echo: EF 60-65%, mild LVH, no rwma, mild MR. Mildy reduced RV fxn. Mild TR.  [name] History of Tobacco abuse   . Hyperlipidemia   . Hypertension   . Left renal artery stenosis (HCC)    a. 11/2017 CTA Chest/Abd/Pelvis: 50-70% L RA stenosis.  . Palpitations    a. 02/2018 Event Monitor: wore for 2 days, 21 hrs - rare PAC's/PVC's. Avg HR 66 (42-103); b. 04/2018 Event Monitor: Wore for 14 days. RSR, 67 (49-111). No significant arrhythmias.  . Persistent cough   . Pulmonary nodule, right    a. 10/2017 CT Chest: 83mm pulm nodule in R lung apex - rec f/u w/ non-contrast chest CT in 1 year.  . Type II diabetes mellitus (HCC)    a. 10/2017 A1c 6.6.    Past Surgical History:  Procedure Laterality Date  . BREAST BIOPSY Right 06/23/2018   US guided biopsy - awaiting pathology  . BREAST CYST EXCISION Right   . CARDIAC  CATHETERIZATION    . CORONARY ARTERY BYPASS GRAFT N/A 11/12/2017   Procedure: CORONARY ARTERY BYPASS GRAFTING (CABG) x Three , using left internal mammary artery and right leg greater saphenous vein harvested endoscopically;  Surgeon: Kerin Perna, MD;  Location: [facility];  Service: Open Heart Surgery;  Laterality: N/A;  . DILATION AND CURETTAGE OF UTERUS    . LAPAROSCOPIC CHOLECYSTECTOMY    . LEFT HEART CATH AND CORONARY ANGIOGRAPHY N/A 11/04/2017   Procedure: LEFT HEART CATH AND CORONARY ANGIOGRAPHY;  Surgeon: Yvonne Kendall, MD;  Location: [facility] INVASIVE CV LAB;  Service: Cardiovascular;  Laterality: N/A;  . LEFT HEART CATH AND CORS/GRAFTS ANGIOGRAPHY N/A 12/09/2017   Procedure: LEFT HEART CATH AND CORS/GRAFTS ANGIOGRAPHY;  Surgeon: [name], Peter M, MD;  Location: [facility] INVASIVE CV LAB;  Service: Cardiovascular;  Laterality: N/A;  . TEE WITHOUT CARDIOVERSION N/A 11/12/2017   Procedure: TRANSESOPHAGEAL ECHOCARDIOGRAM (TEE);  Surgeon: Donata Clay, Theron Arista, MD;  Location: [facility] OR;  Service: Open Heart Surgery;  Laterality: N/A;    Current Medications: Current Meds  Medication Sig  . albuterol (PROVENTIL HFA;VENTOLIN HFA) 108 (90 Base) MCG/ACT inhaler Inhale 2 puffs into the lungs every 6 (six) hours as needed for wheezing or shortness of breath.  [name] aspirin EC 81 MG tablet Take 1 tablet (81 mg total) by mouth daily.  [name] atorvastatin (LIPITOR) 80 MG tablet Take 1 tablet (80 mg total) by mouth daily at 6 PM.  . azelastine (ASTELIN) 0.1 % nasal spray INSTILL 1 SPRAY INTO EACH NOSTRIL PRN FOR ALLERGIES AND DRAINAGE  . benzonatate (TESSALON) 100 MG capsule Take 100 mg by mouth as needed.   . cetirizine (ZYRTEC) 10 MG tablet cetirizine 10 mg tablet  take 1 tablet by mouth once daily  . chlorpheniramine-HYDROcodone (TUSSIONEX PENNKINETIC ER) 10-8 MG/5ML SUER Take 5 mLs by mouth 2 (two) times daily.  . citalopram (CELEXA) 20 MG tablet Take 20 mg by mouth daily.  . cyclobenzaprine (FLEXERIL) 10 MG tablet TK 1 T PO  TID  . dextromethorphan-guaiFENesin (ROBITUSSIN-DM) 10-100 MG/5ML liquid Tussin DM 10 mg-100 mg/5 mL oral syrup  take 5 milliliters by mouth every 6 hours if needed for cough  . doxycycline (MONODOX) 100 MG capsule Take 100 mg by mouth 2 (two) times daily.  [name] ezetimibe (ZETIA) 10 MG tablet TAKE 1 TABLET(10 MG) BY MOUTH DAILY  . fluticasone (FLONASE) 50 MCG/ACT nasal spray SHAKE LQ AND U 2 SPRAYS IEN QD  . Fluticasone-Salmeterol (ADVAIR DISKUS) 250-50 MCG/DOSE AEPB Inhale 1 puff into the lungs 2 (two) times daily.  . isosorbide mononitrate (IMDUR) 30 MG 24 hr tablet Take 1 tablet (30 mg total) by mouth at bedtime.  . lidocaine (XYLOCAINE) 5 % ointment Apply 1 application topically daily as needed.  [name] losartan (COZAAR) 100 MG tablet Take 100 mg by mouth daily.  . metFORMIN (GLUCOPHAGE-XR) 500 MG 24 hr tablet Take 500 mg by mouth daily.  [name] omeprazole (PRILOSEC) 40 MG capsule TAKE 1 CAPSULE BY MOUTH EVERY DAY BEFORE BREAKFAST  . ondansetron (ZOFRAN) 4 MG tablet Take 1 tablet (4 mg total) by mouth every 8 (eight) hours as needed for nausea or vomiting.  [name] oxybutynin (DITROPAN-XL) 5 MG 24 hr tablet TK 1T PO QD FOR INCONTINENCE  . SPIRIVA HANDIHALER 18 MCG inhalation capsule PLACE 1 CAPSULE IN HANDIHALER AND INHALE EVERY DAY  . traMADol (ULTRAM) 50 MG tablet TK 1 T PO Q 6 H    Allergies:   Patient has no known allergies.   Social History   Socioeconomic History  . Marital status: Widowed    Spouse name: Not on file  . Number of children: Not on file  . Years of education: Not on file  . Highest education level: Not on file  Occupational History    Employer: [employer]  Tobacco Use  . Smoking status: Current Some Day Smoker    Packs/day: 1.00    Years: 42.00    Pack years: 42.00    Types: Cigarettes, Cigars    Last attempt to quit: 11/01/2017    Years since quitting: 2.7  . Smokeless tobacco: Never Used  . Tobacco comment: Patient started back in March of 2021.   Vaping Use  .  Vaping Use: Never used  Substance and Sexual Activity  . Alcohol use: No  . Drug use: No  . Sexual activity: Never  Other Topics Concern  . Not on file  Social History Narrative  . Not on file  Social Determinants of Health   Financial Resource Strain:   . Difficulty of Paying Living Expenses: Not on file  Food Insecurity:   . Worried About Programme researcher, broadcasting/film/videounning Out of Food in the Last Year: Not on file  . Ran Out of Food in the Last Year: Not on file  Transportation Needs:   . Lack of Transportation (Medical): Not on file  . Lack of Transportation (Non-Medical): Not on file  Physical Activity:   . Days of Exercise per Week: Not on file  . Minutes of Exercise per Session: Not on file  Stress:   . Feeling of Stress : Not on file  Social Connections:   . Frequency of Communication with Friends and Family: Not on file  . Frequency of Social Gatherings with Friends and Family: Not on file  . Attends Religious Services: Not on file  . Active Member of Clubs or Organizations: Not on file  . Attends BankerClub or Organization Meetings: Not on file  . Marital Status: Not on file     Family History:  The patient's family history includes Heart Problems in her mother; Hypertension in her mother. There is no history of Breast cancer.  ROS:   Review of Systems  Constitutional: Positive for malaise/fatigue. Negative for chills, diaphoresis, fever and weight loss.  HENT: Negative for congestion.   Eyes: Negative for discharge and redness.  Respiratory: Positive for cough. Negative for sputum production, shortness of breath and wheezing.   Cardiovascular: Negative for chest pain, palpitations, orthopnea, claudication, leg swelling and PND.  Gastrointestinal: Negative for abdominal pain, blood in stool, heartburn, melena, nausea and vomiting.  Musculoskeletal: Negative for falls and myalgias.  Skin: Negative for rash.  Neurological: Negative for dizziness, tingling, tremors, sensory change, speech  change, focal weakness, loss of consciousness and weakness.  Endo/Heme/Allergies: Does not bruise/bleed easily.  Psychiatric/Behavioral: Negative for substance abuse. The patient is not nervous/anxious.   All other systems reviewed and are negative.    EKGs/Labs/Other Studies Reviewed:    Studies reviewed were summarized above. The additional studies were reviewed today:  Zio patch 04/2018:  The patient was enrolled for 14 days. 20% of the monitoring period yielded diagnostic tracings.  The predominant rhythm was sinus with an average rate of 67 bpm (range 49-111 bpm).  No significant arrhythmia was observed.  Sinus rhythm without significant arrhythmias. Study significant limited by short monitoring period (only 20% of monitoring period yielded diagnostic tracings). __________  2D echo 03/2018: - Left ventricle: The cavity size was at the upper limits of  normal. Wall thickness was increased in a pattern of mild LVH.  Systolic function was normal. The estimated ejection fraction was  in the range of 60% to 65%. Wall motion was normal; there were no  regional wall motion abnormalities. Left ventricular diastolic  function parameters were normal.  - Mitral valve: There was mild regurgitation.  - Right ventricle: The cavity size was normal. Systolic function  was mildly reduced.  - Tricuspid valve: There was mild regurgitation. __________  Caitlyn BankZio patch 02/2018:  The patient was monitored for 2 days, 21 hours.  The predominant rhythm was sinus with an average rate of 66 bpm (range 42-103).  Rare PAC's and PVC's were noted.  There were no sustained arrhythmias or prolonged pauses.  Sinus rhythm with rare PAC's and PVC's. Brief monitoring period (<3 days). __________  LHC 11/2017:  Dist LM lesion is 30% stenosed.  Mid LAD lesion is 95% stenosed.  Lat 1st Mrg lesion is 90%  stenosed.  Prox Cx to Mid Cx lesion is 80% stenosed.  2nd Mrg lesion is 80%  stenosed.  Prox RCA lesion is 80% stenosed.  Mid RCA lesion is 100% stenosed.  Dist RCA lesion is 90% stenosed.  LIMA graft was visualized by angiography and is normal in caliber.  The graft exhibits no disease.  SVG graft was visualized by angiography and is normal in caliber.  The graft exhibits no disease.  SVG graft was visualized by angiography and is very large.  The graft exhibits no disease.  LV end diastolic pressure is normal.  1. Severe 3 vessel occlusive CAD.  - 95% mid LAD - 90% first OM. This is a small caliber vessel and was not bypassed. - 80-85% anastomotic lesion at the insertion of the SVG into OM 2. - 100% mid RCA 2. Patent LIMA to the LAD 3. Patent SVG to OM 2 4. Patent SVG to PDA 5. Normal LVEDP.  Plan: continue medical therapy. Perhaps the Myoview findings could be explained base on disease in OM1. While there is an anastomotic lesion at the touch down of the SVG to OM2 there is a large graft to native vessel size differential that makes this unfavorable for PCI. I think it is unlikely that this is the cause of her symptoms.   EKG:  EKG is ordered today.  The EKG ordered today demonstrates sinus bradycardia, 57 bpm, left axis deviation, LVH, diffuse T wave inversion largely similar to prior tracing  Recent Labs: 03/02/2020: ALT 14 05/01/2020: BUN 12; Creatinine, Ser 1.30; Hemoglobin 13.4; Platelets 319; Potassium 3.2; Sodium 135  Recent Lipid Panel    Component Value Date/Time   CHOL 113 03/02/2020 1623   CHOL 103 12/17/2017 1015   TRIG 116 03/02/2020 1623   HDL 24 (L) 03/02/2020 1623   HDL 31 (L) 12/17/2017 1015   CHOLHDL 4.7 03/02/2020 1623   VLDL 23 03/02/2020 1623   LDLCALC 66 03/02/2020 1623   LDLCALC 56 12/17/2017 1015    PHYSICAL EXAM:    VS:  BP 110/60 (BP Location: Left Arm, Patient Position: Sitting, Cuff Size: Normal)   Pulse (!) 57   Ht 5\' 6"  (1.676 m)   Wt 195 lb 8 oz (88.7 kg)   SpO2 98%   BMI 31.55 kg/m    BMI: Body mass index is 31.55 kg/m.  Physical Exam Constitutional:      Appearance: She is well-developed.  HENT:     Head: Normocephalic and atraumatic.  Eyes:     General:        Right eye: No discharge.        Left eye: No discharge.  Neck:     Vascular: No JVD.  Cardiovascular:     Rate and Rhythm: Normal rate and regular rhythm.     Pulses: No midsystolic click and no opening snap.          Posterior tibial pulses are 2+ on the right side and 2+ on the left side.     Heart sounds: Normal heart sounds, S1 normal and S2 normal. Heart sounds not distant. No murmur heard.  No friction rub.  Pulmonary:     Effort: Pulmonary effort is normal. No respiratory distress.     Breath sounds: Normal breath sounds. No decreased breath sounds, wheezing or rales.  Chest:     Chest wall: No tenderness.  Abdominal:     General: There is no distension.     Palpations: Abdomen is soft.  Tenderness: There is no abdominal tenderness.  Musculoskeletal:     Cervical back: Normal range of motion.  Skin:    General: Skin is warm and dry.     Nails: There is no clubbing.  Neurological:     Mental Status: She is alert and oriented to person, place, and time.  Psychiatric:        Speech: Speech normal.        Behavior: Behavior normal.        Thought Content: Thought content normal.        Judgment: Judgment normal.     Wt Readings from Last 3 Encounters:  07/24/20 195 lb 8 oz (88.7 kg)  05/12/20 189 lb (85.7 kg)  05/01/20 189 lb (85.7 kg)     ASSESSMENT & PLAN:   1. CAD status post CABG without angina: She is doing well without any symptoms concerning for angina.  Continue current medical therapy including aspirin, atorvastatin, ezetimibe, isosorbide mononitrate, and losartan.  Not on beta-blocker secondary to mildly bradycardic heart rate and relative hypotension.  Continue risk factor modification including complete smoking cessation.  No indication for ischemic testing at this  time.  2. HTN: Blood pressure is well controlled in the office today.  Continue current medical therapy including losartan.  Check BMP given her prior AKI and hypokalemia on HCTZ.  3. HLD: LDL of 66 from 02/2020.  She remains on atorvastatin and ezetimibe.  4. Pulmonary hypertension: Mild by most recent echo from 05/2020 with a PASP of 37.3 mmHg.  Likely driven by her underlying COPD.  Not requiring standing diuretic.  Recommend she follow-up with pulmonology.  5. COPD: Followed by pulmonology.  6. GERD: Previously recommended to undergo EGD.  Followed by GI.     Disposition: F/u with Dr. Okey Dupre or an APP in 6 months, sooner if needed.   Medication Adjustments/Labs and Tests Ordered: Current medicines are reviewed at length with the patient today.  Concerns regarding medicines are outlined above. Medication changes, Labs and Tests ordered today are summarized above and listed in the Patient Instructions accessible in Encounters.   Signed, Eula Listen, PA-C 07/24/2020 1:27 PM     [facility] -  [address] [place], [place] [address] [phone]

## 2020-07-24 ENCOUNTER — Other Ambulatory Visit: Payer: Self-pay

## 2020-07-24 ENCOUNTER — Encounter: Payer: Self-pay | Admitting: Physician Assistant

## 2020-07-24 ENCOUNTER — Ambulatory Visit (INDEPENDENT_AMBULATORY_CARE_PROVIDER_SITE_OTHER): Payer: Medicare Other | Admitting: Physician Assistant

## 2020-07-24 VITALS — BP 110/60 | HR 57 | Ht 66.0 in | Wt 195.5 lb

## 2020-07-24 DIAGNOSIS — I2510 Atherosclerotic heart disease of native coronary artery without angina pectoris: Secondary | ICD-10-CM

## 2020-07-24 DIAGNOSIS — K219 Gastro-esophageal reflux disease without esophagitis: Secondary | ICD-10-CM

## 2020-07-24 DIAGNOSIS — Z951 Presence of aortocoronary bypass graft: Secondary | ICD-10-CM

## 2020-07-24 DIAGNOSIS — E785 Hyperlipidemia, unspecified: Secondary | ICD-10-CM | POA: Diagnosis not present

## 2020-07-24 DIAGNOSIS — J449 Chronic obstructive pulmonary disease, unspecified: Secondary | ICD-10-CM

## 2020-07-24 DIAGNOSIS — I272 Pulmonary hypertension, unspecified: Secondary | ICD-10-CM

## 2020-07-24 DIAGNOSIS — I10 Essential (primary) hypertension: Secondary | ICD-10-CM

## 2020-07-24 NOTE — Patient Instructions (Signed)
Medication Instructions:  Your physician recommends that you continue on your current medications as directed. Please refer to the Current Medication list given to you today.  *If you need a refill on your cardiac medications before your next appointment, please call your pharmacy*   Lab Work: Bmet today  If you have labs (blood work) drawn today and your tests are completely normal, you will receive your results only by: [name] MyChart Message (if you have MyChart) OR . A paper copy in the mail If you have any lab test that is abnormal or we need to change your treatment, we will call you to review the results.   Testing/Procedures: None ordered   Follow-Up: At [facility], you and your health needs are our priority.  As part of our continuing mission to provide you with exceptional heart care, we have created designated Provider Care Teams.  These Care Teams include your primary Cardiologist (physician) and Advanced Practice Providers (APPs -  Physician Assistants and Nurse Practitioners) who all work together to provide you with the care you need, when you need it.  We recommend signing up for the patient portal called "MyChart".  Sign up information is provided on this After Visit Summary.  MyChart is used to connect with patients for Virtual Visits (Telemedicine).  Patients are able to view lab/test results, encounter notes, upcoming appointments, etc.  Non-urgent messages can be sent to your provider as well.   To learn more about what you can do with MyChart, go to [email or web address].    Your next appointment:   6 month(s)  The format for your next appointment:   In Person  Provider:   You may see Yvonne Kendall, MD or one of the following Advanced Practice Providers on your designated Care Team:    Nicolasa Ducking, NP  Eula Listen, PA-C  Marisue Ivan, PA-C  Cadence Fransico Michael, [place]    Other Instructions N/A

## 2020-07-25 LAB — BASIC METABOLIC PANEL
BUN/Creatinine Ratio: 14 (ref 12–28)
BUN: 17 mg/dL (ref 8–27)
CO2: 25 mmol/L (ref 20–29)
Calcium: 10.9 mg/dL — ABNORMAL HIGH (ref 8.7–10.3)
Chloride: 93 mmol/L — ABNORMAL LOW (ref 96–106)
Creatinine, Ser: 1.21 mg/dL — ABNORMAL HIGH (ref 0.57–1.00)
GFR calc Af Amer: 56 mL/min/{1.73_m2} — ABNORMAL LOW (ref >59)
GFR calc non Af Amer: 48 mL/min/{1.73_m2} — ABNORMAL LOW (ref >59)
Glucose: 341 mg/dL — ABNORMAL HIGH (ref 65–99)
Potassium: 4.7 mmol/L (ref 3.5–5.2)
Sodium: 134 mmol/L (ref 134–144)

## 2020-07-28 ENCOUNTER — Ambulatory Visit: Payer: Medicare Other | Admitting: Internal Medicine

## 2020-08-26 ENCOUNTER — Other Ambulatory Visit: Payer: Self-pay | Admitting: Internal Medicine

## 2020-08-28 NOTE — Telephone Encounter (Signed)
Called pt to verify if pt is taking carvedilol 12.5 mg tablet. VM is full and no answer. Please verify if pt is taking, dose and instructions if taking.

## 2020-09-06 ENCOUNTER — Other Ambulatory Visit: Payer: Self-pay | Admitting: Physician Assistant

## 2020-09-06 DIAGNOSIS — I25118 Atherosclerotic heart disease of native coronary artery with other forms of angina pectoris: Secondary | ICD-10-CM

## 2020-09-08 NOTE — Telephone Encounter (Signed)
Called pt to verify if pt is taking medication no answer.

## 2020-09-25 ENCOUNTER — Other Ambulatory Visit: Payer: Self-pay | Admitting: Internal Medicine

## 2020-09-25 NOTE — Telephone Encounter (Signed)
Rx request sent to pharmacy.  

## 2020-10-03 ENCOUNTER — Other Ambulatory Visit: Payer: Self-pay | Admitting: Pulmonary Disease

## 2020-10-03 ENCOUNTER — Other Ambulatory Visit: Payer: Self-pay | Admitting: Internal Medicine

## 2020-10-24 ENCOUNTER — Other Ambulatory Visit: Payer: Self-pay | Admitting: Internal Medicine

## 2020-11-15 ENCOUNTER — Other Ambulatory Visit: Payer: Self-pay | Admitting: Internal Medicine

## 2020-12-11 ENCOUNTER — Other Ambulatory Visit: Payer: Self-pay | Admitting: Internal Medicine

## 2020-12-11 DIAGNOSIS — E785 Hyperlipidemia, unspecified: Secondary | ICD-10-CM

## 2020-12-13 ENCOUNTER — Encounter: Payer: Self-pay | Admitting: Pulmonary Disease

## 2020-12-13 ENCOUNTER — Ambulatory Visit (INDEPENDENT_AMBULATORY_CARE_PROVIDER_SITE_OTHER): Payer: Medicare Other | Admitting: Pulmonary Disease

## 2020-12-13 ENCOUNTER — Other Ambulatory Visit: Payer: Self-pay

## 2020-12-13 VITALS — BP 122/70 | HR 66 | Temp 97.1°F | Ht 66.0 in | Wt 190.0 lb

## 2020-12-13 DIAGNOSIS — F17210 Nicotine dependence, cigarettes, uncomplicated: Secondary | ICD-10-CM

## 2020-12-13 DIAGNOSIS — J449 Chronic obstructive pulmonary disease, unspecified: Secondary | ICD-10-CM

## 2020-12-13 DIAGNOSIS — K219 Gastro-esophageal reflux disease without esophagitis: Secondary | ICD-10-CM | POA: Diagnosis not present

## 2020-12-13 NOTE — Patient Instructions (Addendum)
Continue using Trelegy.  We are going to get some breathing tests.  We are going to enroll you in the lung cancer screening program.   We will see you in follow-up in 4 months time call sooner should any new problems arise.

## 2020-12-13 NOTE — Progress Notes (Signed)
Subjective:    Patient ID: Caitlyn Herrera, female    DOB: 1961-03-07, 60 y.o.   MRN: [id number]  Chief Complaint  Patient presents with  . Follow-up    C/o dry cough.    HPI This is a 60 year old current smoker (3 cigarettes daily) who presents for follow-up on with associated dry cough.  This is a scheduled visit.  She is currently on Trelegy Ellipta 100/62.5/25 and appears to be doing well with this.  She has a 42-pack-year history of smoking.  She has quit smoking cigarettes but is now smoking cigars daily.  She was counseled regards discontinuation of smoking.  She feels Trelegy is helping her.  She does not endorse any fevers, chills or sweats.  No change in the nature of her cough which is now dry mostly in the mornings.  No hemoptysis.  She is with gastroesophageal reflux but these are now controlled with Prilosec.  In turn, this has also control her cough.   Review of Systems A 10 point review of systems was performed and it is as noted above otherwise negative.  Patient Active Problem List   Diagnosis Date Noted  . Coronary artery disease involving native coronary artery of native heart without angina pectoris 02/22/2019  . Chronic cough 05/28/2018  . Shortness of breath 03/26/2018  . Essential hypertension 03/26/2018  . Palpitations 03/26/2018  . Chest pressure 12/08/2017  . Unstable angina (HCC) 12/08/2017  . Angina at rest [facility])   . Hyperlipidemia LDL goal <70   . S/P CABG (coronary artery bypass graft) 11/12/2017  . Non-insulin dependent type 2 diabetes mellitus (HCC) 11/10/2017  . 3-vessel CAD 11/10/2017  . HCVD (hypertensive cardiovascular disease) 11/10/2017  . UTI (urinary tract infection) 11/10/2017  . Pulmonary nodule, right 11/10/2017  . Dyslipidemia 11/07/2017  . Tobacco abuse   . NSTEMI (non-ST elevated myocardial infarction) (HCC) 11/05/2017   Social History   Tobacco Use  . Smoking status: Current Some Day Smoker    Packs/day: 1.00    Years:  42.00    Pack years: 42.00    Types: Cigarettes, Cigars    Last attempt to quit: 11/01/2017    Years since quitting: 3.1  . Smokeless tobacco: Never Used  . Tobacco comment: 3 cigars daily--12/13/2020  Substance Use Topics  . Alcohol use: No   No Known Allergies Current Meds  Medication Sig  . albuterol (PROVENTIL HFA;VENTOLIN HFA) 108 (90 Base) MCG/ACT inhaler Inhale 2 puffs into the lungs every 6 (six) hours as needed for wheezing or shortness of breath.  [name] aspirin EC 81 MG tablet Take 1 tablet (81 mg total) by mouth daily.  [name] atorvastatin (LIPITOR) 80 MG tablet TAKE 1 TABLET(80 MG) BY MOUTH DAILY AT 6 PM  . azelastine (ASTELIN) 0.1 % nasal spray INSTILL 1 SPRAY INTO EACH NOSTRIL PRN FOR ALLERGIES AND DRAINAGE  . benzonatate (TESSALON) 100 MG capsule Take 100 mg by mouth as needed.   . cetirizine (ZYRTEC) 10 MG tablet cetirizine 10 mg tablet  take 1 tablet by mouth once daily  . chlorpheniramine-HYDROcodone (TUSSIONEX PENNKINETIC ER) 10-8 MG/5ML SUER Take 5 mLs by mouth 2 (two) times daily.  . citalopram (CELEXA) 20 MG tablet Take 20 mg by mouth daily.  . cyclobenzaprine (FLEXERIL) 10 MG tablet TK 1 T PO TID  . dextromethorphan-guaiFENesin (ROBITUSSIN-DM) 10-100 MG/5ML liquid Tussin DM 10 mg-100 mg/5 mL oral syrup  take 5 milliliters by mouth every 6 hours if needed for cough  . doxycycline (MONODOX) 100  MG capsule Take 100 mg by mouth 2 (two) times daily.  [name] ezetimibe (ZETIA) 10 MG tablet TAKE 1 TABLET(10 MG) BY MOUTH DAILY  . fluticasone (FLONASE) 50 MCG/ACT nasal spray SHAKE LQ AND U 2 SPRAYS IEN QD  . Fluticasone-Umeclidin-Vilant (TRELEGY ELLIPTA) 100-62.5-25 MCG/INH AEPB Inhale into the lungs.  . isosorbide mononitrate (IMDUR) 30 MG 24 hr tablet TAKE 1 TABLET(30 MG) BY MOUTH AT BEDTIME  . lidocaine (XYLOCAINE) 5 % ointment Apply 1 application topically daily as needed.  [name] losartan (COZAAR) 100 MG tablet Take 100 mg by mouth daily.  . metFORMIN (GLUCOPHAGE-XR) 500 MG 24 hr tablet  Take 500 mg by mouth daily.  [name] omeprazole (PRILOSEC) 40 MG capsule TAKE 1 CAPSULE BY MOUTH EVERY DAY BEFORE BREAKFAST  . ondansetron (ZOFRAN) 4 MG tablet Take 1 tablet (4 mg total) by mouth every 8 (eight) hours as needed for nausea or vomiting.  [name] oxybutynin (DITROPAN-XL) 5 MG 24 hr tablet TK 1T PO QD FOR INCONTINENCE  . traMADol (ULTRAM) 50 MG tablet TK 1 T PO Q 6 H   Immunization History  Administered Date(s) Administered  . Influenza-Unspecified 09/30/2019, 12/11/2020  . Moderna SARS-COV2 Booster Vaccination 12/11/2020  . Moderna Sars-Covid-2 Vaccination 04/19/2020, 05/17/2020      Objective:   Physical Exam BP 122/70 (BP Location: Left Arm, Cuff Size: Normal)   Pulse 66   Temp (!) 97.1 F (36.2 C) (Temporal)   Ht 5\' 6"  (1.676 m)   Wt 190 lb (86.2 kg)   SpO2 100%   BMI 30.67 kg/m  GENERAL: This woman, no acute distress, fully ambulatory.  No conversational dyspnea HEAD: Normocephalic, atraumatic.  EYES: Pupils equal, round, reactive to light.  No scleral icterus.  MOUTH: Nose/mouth/throat not examined due to masking requirements for COVID 19. NECK: Supple. No thyromegaly. Trachea midline. No JVD.  No adenopathy. PULMONARY: Good air entry bilaterally.  Coarse otherwise, no adventitious sounds. CARDIOVASCULAR: S1 and S2. Regular rate and rhythm.  ABDOMEN: Obese otherwise benign. MUSCULOSKELETAL: No joint deformity, no clubbing, no edema.  NEUROLOGIC: No focal deficit, no gait disturbance, speech is fluent. SKIN: Intact,warm,dry. PSYCH: Mood and behavior normal.      Assessment & Plan:     ICD-10-CM   1. Mixed type COPD (chronic obstructive pulmonary disease) (HCC)  J44.9 Pulmonary Function Test [facility] Only   Will obtain PFTs Continue Trelegy and as needed albuterol Follow-up 4 months  2. Chronic GERD  K21.9    Doing well on PPI Continue PPI Continue antireflux measures  3. Tobacco dependence due to cigarettes  F17.210 Ambulatory Referral for Lung Cancer Scre   Has  switched to cigars Counseled regards discontinuation of smoking Will enroll in lung cancer screening program 42-pack-year history   Orders Placed This Encounter  Procedures  . Ambulatory Referral for Lung Cancer Scre    Referral Priority:   Routine    Referral Type:   Consultation    Referral Reason:   Specialty Services Required    Number of Visits Requested:   1  . Pulmonary Function Test [facility] Only    Standing Status:   Future    Standing Expiration Date:   12/13/2021    Scheduling Instructions:     Next available,    Order Specific Question:   Full PFT: includes the following: basic spirometry, spirometry pre & post bronchodilator, diffusion capacity (DLCO), lung volumes    Answer:   Full PFT   We will see the patient in follow-up in 4 months time she  is to contact us prior to that time should any new difficulties arise.  Gailen Shelter, MD [place] [facility]   *This note was dictated using voice recognition software/Dragon.  Despite best efforts to proofread, errors can occur which can change the meaning.  Any change was purely unintentional.

## 2020-12-21 ENCOUNTER — Telehealth: Payer: Self-pay | Admitting: *Deleted

## 2020-12-21 DIAGNOSIS — F17200 Nicotine dependence, unspecified, uncomplicated: Secondary | ICD-10-CM

## 2020-12-21 DIAGNOSIS — Z87891 Personal history of nicotine dependence: Secondary | ICD-10-CM

## 2020-12-21 DIAGNOSIS — Z122 Encounter for screening for malignant neoplasm of respiratory organs: Secondary | ICD-10-CM

## 2020-12-21 NOTE — Telephone Encounter (Signed)
Received referral for initial lung cancer screening scan. Contacted patient and obtained smoking history,(current, 42 pack year) as well as answering questions related to screening process. Patient denies signs of lung cancer such as weight loss or hemoptysis. Patient denies comorbidity that would prevent curative treatment if lung cancer were found. Patient is scheduled for shared decision making visit and CT scan on 01/09/21 at 2pm.

## 2020-12-27 ENCOUNTER — Other Ambulatory Visit: Payer: Self-pay | Admitting: Internal Medicine

## 2021-01-05 ENCOUNTER — Other Ambulatory Visit: Payer: Self-pay | Admitting: Internal Medicine

## 2021-01-09 ENCOUNTER — Ambulatory Visit
Admission: RE | Admit: 2021-01-09 | Discharge: 2021-01-09 | Disposition: A | Discharge status: Home / Self Care | Payer: Medicare HMO | Source: Ambulatory Visit | Attending: Nurse Practitioner | Admitting: Nurse Practitioner

## 2021-01-09 ENCOUNTER — Other Ambulatory Visit: Payer: Self-pay

## 2021-01-09 ENCOUNTER — Inpatient Hospital Stay: Payer: Medicare HMO | Attending: Nurse Practitioner | Admitting: Nurse Practitioner

## 2021-01-09 ENCOUNTER — Other Ambulatory Visit: Payer: Self-pay | Admitting: Internal Medicine

## 2021-01-09 DIAGNOSIS — F17200 Nicotine dependence, unspecified, uncomplicated: Secondary | ICD-10-CM

## 2021-01-09 DIAGNOSIS — F17210 Nicotine dependence, cigarettes, uncomplicated: Secondary | ICD-10-CM

## 2021-01-09 DIAGNOSIS — Z87891 Personal history of nicotine dependence: Secondary | ICD-10-CM | POA: Diagnosis not present

## 2021-01-09 DIAGNOSIS — Z122 Encounter for screening for malignant neoplasm of respiratory organs: Secondary | ICD-10-CM | POA: Diagnosis present

## 2021-01-09 NOTE — Progress Notes (Signed)
Virtual Visit via Video Enabled Telemedicine Note   I connected with Caitlyn Herrera on 01/09/21 at 2:00 PM EST by video enabled telemedicine visit and verified that I am speaking with the correct person using two identifiers.   I discussed the limitations, risks, security and privacy concerns of performing an evaluation and management service by telemedicine and the availability of in-person appointments. I also discussed with the patient that there may be a patient responsible charge related to this service. The patient expressed understanding and agreed to proceed.   Other persons participating in the visit and their role in the encounter: Burgess Estelle, RN- checking in patient & navigation  Patient's location: [place]  Provider's location: Clinic  Chief Complaint: Low Dose CT Screening  Patient agreed to evaluation by telemedicine to discuss shared decision making for consideration of low dose CT lung cancer screening.    In accordance with CMS guidelines, patient has met eligibility criteria including age, absence of signs or symptoms of lung cancer.  Social History   Tobacco Use  . Smoking status: Current Some Day Smoker    Packs/day: 1.00    Years: 42.00    Pack years: 42.00    Types: Cigarettes, Cigars    Last attempt to quit: 11/01/2017    Years since quitting: 3.1  . Smokeless tobacco: Never Used  . Tobacco comment: 3 cigars daily--12/13/2020  Substance Use Topics  . Alcohol use: No     A shared decision-making session was conducted prior to the performance of CT scan. This includes one or more decision aids, includes benefits and harms of screening, follow-up diagnostic testing, over-diagnosis, false positive rate, and total radiation exposure.   Counseling on the importance of adherence to annual lung cancer LDCT screening, impact of co-morbidities, and ability or willingness to undergo diagnosis and treatment is imperative for compliance of the program.    Counseling on the importance of continued smoking cessation for former smokers; the importance of smoking cessation for current smokers, and information about tobacco cessation interventions have been given to patient including [place] and [employer] [place] programs.   Written order for lung cancer screening with LDCT has been given to the patient and any and all questions have been answered to the best of my abilities.    Yearly follow up will be coordinated by Burgess Estelle, Thoracic Navigator.  I discussed the assessment and treatment plan with the patient. The patient was provided an opportunity to ask questions and all were answered. The patient agreed with the plan and demonstrated an understanding of the instructions.   The patient was advised to call back or seek an in-person evaluation if the symptoms worsen or if the condition fails to improve as anticipated.   I provided 15 minutes of face-to-face video visit time dedicated to the care of this patient on the date of this encounter to include pre-visit review of smoking history, face-to-face time with the patient, and post visit ordering of testing/documentation.   Beckey Rutter, DNP, AGNP-C [place] [facility] [phone] (clinic)

## 2021-01-11 ENCOUNTER — Telehealth: Payer: Self-pay | Admitting: Internal Medicine

## 2021-01-11 NOTE — Telephone Encounter (Signed)
Reviewing patients chart, I could not find when Carvedilol fell off patients med list but I did find the following note regarding beta blocker for the patient during her last OV with Eula Listen.   In Ryan Dunn's OV note from 07/24/20 1.  Not on beta-blocker secondary to mildly bradycardic heart rate and relative hypotension.    I spoke to [place] and he would like to have the patient seen so we can determine if she needs to be on the Carvedilol or not. Patient has a follow up already scheduled with Dr. Okey Dupre on 02/08/21. I called patient and informed her of this and she is agreeable to the plan and confirmed that she will be here on 02/08/21.

## 2021-01-11 NOTE — Telephone Encounter (Signed)
*  STAT* If patient is at the pharmacy, call can be transferred to refill team.   1. Which medications need to be refilled? (please list name of each medication and dose if known) carvedilol 12.5 mg po BID   2. Which pharmacy/location (including street and city if local pharmacy) is medication to be sent to? [facility] [address]   3. Do they need a 30 day or 90 day supply? 90     Has been out for 2 weeks.

## 2021-01-12 NOTE — Progress Notes (Signed)
She will need to continue yearly lung cancer screening.  No overt abnormalities on current study.

## 2021-01-16 ENCOUNTER — Ambulatory Visit: Payer: Medicare Other | Admitting: Pulmonary Disease

## 2021-01-17 ENCOUNTER — Encounter: Payer: Self-pay | Admitting: *Deleted

## 2021-02-08 ENCOUNTER — Other Ambulatory Visit: Payer: Self-pay

## 2021-02-08 ENCOUNTER — Encounter: Payer: Self-pay | Admitting: Internal Medicine

## 2021-02-08 ENCOUNTER — Ambulatory Visit (INDEPENDENT_AMBULATORY_CARE_PROVIDER_SITE_OTHER): Payer: Medicare HMO | Admitting: Internal Medicine

## 2021-02-08 VITALS — BP 140/70 | HR 81 | Ht 66.0 in | Wt 195.0 lb

## 2021-02-08 DIAGNOSIS — E1169 Type 2 diabetes mellitus with other specified complication: Secondary | ICD-10-CM

## 2021-02-08 DIAGNOSIS — R079 Chest pain, unspecified: Secondary | ICD-10-CM

## 2021-02-08 DIAGNOSIS — I25119 Atherosclerotic heart disease of native coronary artery with unspecified angina pectoris: Secondary | ICD-10-CM | POA: Diagnosis not present

## 2021-02-08 DIAGNOSIS — Z951 Presence of aortocoronary bypass graft: Secondary | ICD-10-CM | POA: Diagnosis not present

## 2021-02-08 DIAGNOSIS — E785 Hyperlipidemia, unspecified: Secondary | ICD-10-CM

## 2021-02-08 DIAGNOSIS — I10 Essential (primary) hypertension: Secondary | ICD-10-CM

## 2021-02-08 DIAGNOSIS — I2510 Atherosclerotic heart disease of native coronary artery without angina pectoris: Secondary | ICD-10-CM

## 2021-02-08 MED ORDER — CARVEDILOL 6.25 MG PO TABS: 6.2500 mg | ORAL_TABLET | Freq: Two times a day (BID) | ORAL | 1 refills | Status: DC

## 2021-02-08 MED ORDER — NITROGLYCERIN 0.4 MG SL SUBL: 0.4000 mg | SUBLINGUAL_TABLET | SUBLINGUAL | 1 refills | Status: DC | PRN

## 2021-02-08 NOTE — Patient Instructions (Signed)
Medication Instructions:  Your physician has recommended you make the following change in your medication:  START carvedilol 6.25mg  by mouth twice daily Continue all other medications as directed.  *If you need a refill on your cardiac medications before your next appointment, please call your pharmacy*   Lab Work: None ordered   If you have labs (blood work) drawn today and your tests are completely normal, you will receive your results only by: [name] MyChart Message (if you have MyChart) OR . A paper copy in the mail If you have any lab test that is abnormal or we need to change your treatment, we will call you to review the results.   Testing/Procedures:  [facility] MYOVIEW   Your caregiver has ordered a Stress Test with nuclear imaging. The purpose of this test is to evaluate the blood supply to your heart muscle. This procedure is referred to as a "Non-Invasive Stress Test." This is because other than having an IV started in your vein, nothing is inserted or "invades" your body. Cardiac stress tests are done to find areas of poor blood flow to the heart by determining the extent of coronary artery disease (CAD). Some patients exercise on a treadmill, which naturally increases the blood flow to your heart, while others who are  unable to walk on a treadmill due to physical limitations have a pharmacologic/chemical stress agent called Lexiscan . This medicine will mimic walking on a treadmill by temporarily increasing your coronary blood flow.      PLEASE REPORT TO [facility] MEDICAL MALL ENTRANCE   THE VOLUNTEERS AT THE FIRST DESK WILL DIRECT YOU WHERE TO GO     *Please note: these test may take anywhere between 2-4 hours to complete       Date of Procedure:_____________________________________   Arrival Time for Procedure:______________________________    PLEASE NOTIFY THE OFFICE AT LEAST 24 HOURS IN ADVANCE IF YOU ARE UNABLE TO KEEP YOUR APPOINTMENT.  [phone]  PLEASE NOTIFY NUCLEAR  MEDICINE AT [facility] AT LEAST 24 HOURS IN ADVANCE IF YOU ARE UNABLE TO KEEP YOUR APPOINTMENT. [phone]         How to prepare for your Myoview test:         _XX___:  Hold diabetes medication the morning of procedure: metFORMIN (GLUCOPHAGE-XR)   _XX__:  Hold betablocker(s) night before procedure and morning of procedure: carvedilol (COREG)  1. Do not eat or drink after midnight  2. No caffeine for 24 hours prior to test  3. No smoking 24 hours prior to test.  4. Unless instructed otherwise, Take your medication with a small sips of water.    5.         Ladies, please do not wear dresses. Skirts or pants are appropriate. Please wear a short sleeve shirt.  6. No perfume, cologne or lotion.  7. Wear comfortable walking shoes. No heels!    Follow-Up: At [facility], you and your health needs are our priority.  As part of our continuing mission to provide you with exceptional heart care, we have created designated Provider Care Teams.  These Care Teams include your primary Cardiologist (physician) and Advanced Practice Providers (APPs -  Physician Assistants and Nurse Practitioners) who all work together to provide you with the care you need, when you need it.  We recommend signing up for the patient portal called "MyChart".  Sign up information is provided on this After Visit Summary.  MyChart is used to connect with patients for Virtual Visits (Telemedicine).  Patients are able to view lab/test results, encounter notes, upcoming appointments, etc.  Non-urgent messages can be sent to your provider as well.   To learn more about what you can do with MyChart, go to [email or web address].    Your next appointment:   1 month(s)  The format for your next appointment:   In Person  Provider:   You may see Yvonne Kendall, MD or one of the following Advanced Practice Providers on your designated Care Team:    Nicolasa Ducking, NP  Eula Listen, PA-C  Marisue Ivan, PA-C  [place]  [place], [place]  Gillian Shields, NP

## 2021-02-08 NOTE — Progress Notes (Signed)
Follow-up Outpatient Visit Date: 02/08/2021  Primary Care Provider: [name] [address] [place] [address]  Chief Complaint: Follow-up coronary artery disease  HPI:  Caitlyn Herrera is a 60 y.o. female with history of  CAD s/p 3-vessel CABG in 10/2017, mild pulmonary hypertension,COPD secondary to tobacco use,DM2, HTN, HLD,and GERD, who presents for follow-up of coronary artery disease and hyperlipidemia.  She was last seen in our office by Caitlyn Listen, PA, in 06/2020, at which time she was doing well.  She noted improvement in shortness of breath and chest discomfort with isosorbide mononitrate.  Today, [name] reports that she has been feeling more chest tightness and shortness of breath over the last several months.  She describes this as a heaviness that is notably worse than at her prior visit with Caitlyn Herrera in September.  She also notes a single episode of a "sticking" pain in the center of her chest that occurred at rest about a month ago.  It only lasted for a few seconds and was associated with transient fluttering.  It has not recurred.  She lightheadedness and edema.  Her home blood pressures are typically around 150/70.  She notes that carvedilol was stopped at 1 point though she does not recall specifics.  She is trying to minimize her sodium intake.  --------------------------------------------------------------------------------------------------  Past Medical History:  Diagnosis Date  . Abdominal aortic ectasia (HCC)    a. 11/2017 CTA chest/abd/pelvis: 2.5 cm abd ao ectasia -->rec f/u u/s in 5 yrs.  [name] CAD (coronary artery disease)    a. 11/04/17 Cath: Native multivessel dzs-->CABG x 3 (LIMA->LAD, VG->OM2, VG->RPDA; b. 11/2017 MV: mid antlat/apical isch; c. 11/2017 Cath: LM 30d, LAD 75m, LCX 80p/m, OM1 90, OM2 80 (@ anastamosis of graft), RCA 80p, 158m, 90d, LIMA->LAD nl, VG->OM2 nl, VG->RPDA nl-->Med Rx.  . Carotid arterial disease  (HCC)    a. 10/2017 Carotid U/S: 1-30% bilat ICA stenosis.  [name] History of echocardiogram    a. 11/04/2017 Echo: EF of 65-70%, no RWMA, nl LV diastolic fxn, nl RV size/fxn, mild TR; b. 03/2018 Echo: EF 60-65%, mild LVH, no rwma, mild MR. Mildy reduced RV fxn. Mild TR.  [name] History of Tobacco abuse   . Hyperlipidemia   . Hypertension   . Left renal artery stenosis (HCC)    a. 11/2017 CTA Chest/Abd/Pelvis: 50-70% L RA stenosis.  . Palpitations    a. 02/2018 Event Monitor: wore for 2 days, 21 hrs - rare PAC's/PVC's. Avg HR 66 (42-103); b. 04/2018 Event Monitor: Wore for 14 days. RSR, 67 (49-111). No significant arrhythmias.  . Persistent cough   . Pulmonary nodule, right    a. 10/2017 CT Chest: 86mm pulm nodule in R lung apex - rec f/u w/ non-contrast chest CT in 1 year.  . Type II diabetes mellitus (HCC)    a. 10/2017 A1c 6.6.   Past Surgical History:  Procedure Laterality Date  . BREAST BIOPSY Right 06/23/2018   Caitlyn Herrera guided biopsy - awaiting pathology  . BREAST CYST EXCISION Right   . CARDIAC CATHETERIZATION    . CORONARY ARTERY BYPASS GRAFT N/A 11/12/2017   Procedure: CORONARY ARTERY BYPASS GRAFTING (CABG) x Three , using left internal mammary artery and right leg greater saphenous vein harvested endoscopically;  Surgeon: Kerin Perna, MD;  Location: [facility] OR;  Service: Open Heart Surgery;  Laterality: N/A;  . DILATION AND CURETTAGE OF UTERUS    . LAPAROSCOPIC CHOLECYSTECTOMY    . LEFT HEART  CATH AND CORONARY ANGIOGRAPHY N/A 11/04/2017   Procedure: LEFT HEART CATH AND CORONARY ANGIOGRAPHY;  Surgeon: Yvonne Kendall, MD;  Location: [facility] INVASIVE CV LAB;  Service: Cardiovascular;  Laterality: N/A;  . LEFT HEART CATH AND CORS/GRAFTS ANGIOGRAPHY N/A 12/09/2017   Procedure: LEFT HEART CATH AND CORS/GRAFTS ANGIOGRAPHY;  Surgeon: [name], Peter M, MD;  Location: [facility] INVASIVE CV LAB;  Service: Cardiovascular;  Laterality: N/A;  . TEE WITHOUT CARDIOVERSION N/A 11/12/2017   Procedure: TRANSESOPHAGEAL ECHOCARDIOGRAM  (TEE);  Surgeon: Donata Clay, Theron Arista, MD;  Location: [facility] OR;  Service: Open Heart Surgery;  Laterality: N/A;     Current Meds  Medication Sig  . albuterol (PROVENTIL HFA;VENTOLIN HFA) 108 (90 Base) MCG/ACT inhaler Inhale 2 puffs into the lungs every 6 (six) hours as needed for wheezing or shortness of breath.  [name] aspirin EC 81 MG tablet Take 1 tablet (81 mg total) by mouth daily.  [name] atorvastatin (LIPITOR) 80 MG tablet TAKE 1 TABLET(80 MG) BY MOUTH DAILY AT 6 PM  . azelastine (ASTELIN) 0.1 % nasal spray INSTILL 1 SPRAY INTO EACH NOSTRIL PRN FOR ALLERGIES AND DRAINAGE  . benzonatate (TESSALON) 100 MG capsule Take 100 mg by mouth as needed.   . carvedilol (COREG) 6.25 MG tablet Take 1 tablet (6.25 mg total) by mouth 2 (two) times daily.  . cetirizine (ZYRTEC) 10 MG tablet cetirizine 10 mg tablet  take 1 tablet by mouth once daily  . chlorpheniramine-HYDROcodone (TUSSIONEX PENNKINETIC ER) 10-8 MG/5ML SUER Take 5 mLs by mouth 2 (two) times daily.  . citalopram (CELEXA) 20 MG tablet Take 20 mg by mouth daily.  . cyclobenzaprine (FLEXERIL) 10 MG tablet TK 1 T PO TID  . dextromethorphan-guaiFENesin (ROBITUSSIN-DM) 10-100 MG/5ML liquid Tussin DM 10 mg-100 mg/5 mL oral syrup  take 5 milliliters by mouth every 6 hours if needed for cough  . doxycycline (MONODOX) 100 MG capsule Take 100 mg by mouth 2 (two) times daily.  [name] ezetimibe (ZETIA) 10 MG tablet TAKE 1 TABLET(10 MG) BY MOUTH DAILY  . fluticasone (FLONASE) 50 MCG/ACT nasal spray SHAKE LQ AND U 2 SPRAYS IEN QD  . Fluticasone-Salmeterol (ADVAIR DISKUS) 250-50 MCG/DOSE AEPB Inhale 1 puff into the lungs 2 (two) times daily.  . Fluticasone-Umeclidin-Vilant (TRELEGY ELLIPTA) 100-62.5-25 MCG/INH AEPB Inhale into the lungs.  . isosorbide mononitrate (IMDUR) 30 MG 24 hr tablet TAKE 1 TABLET(30 MG) BY MOUTH AT BEDTIME  . lidocaine (XYLOCAINE) 5 % ointment Apply 1 application topically daily as needed.  [name] losartan (COZAAR) 100 MG tablet Take 100 mg by mouth  daily.  . metFORMIN (GLUCOPHAGE-XR) 500 MG 24 hr tablet Take 500 mg by mouth daily.  [name] omeprazole (PRILOSEC) 40 MG capsule TAKE 1 CAPSULE BY MOUTH EVERY DAY BEFORE BREAKFAST  . ondansetron (ZOFRAN) 4 MG tablet Take 1 tablet (4 mg total) by mouth every 8 (eight) hours as needed for nausea or vomiting.  [name] oxybutynin (DITROPAN-XL) 5 MG 24 hr tablet TK 1T PO QD FOR INCONTINENCE  . traMADol (ULTRAM) 50 MG tablet TK 1 T PO Q 6 H  . [DISCONTINUED] nitroGLYCERIN (NITROSTAT) 0.4 MG SL tablet Place 0.4 mg under the tongue every 5 (five) minutes as needed for chest pain.    Allergies: Patient has no known allergies.  Social History   Tobacco Use  . Smoking status: Current Some Day Smoker    Packs/day: 1.00    Years: 42.00    Pack years: 42.00    Types: Cigarettes, Cigars    Last attempt to quit: 11/01/2017  Years since quitting: 3.2  . Smokeless tobacco: Never Used  . Tobacco comment: 3 cigars daily--12/13/2020  Vaping Use  . Vaping Use: Never used  Substance Use Topics  . Alcohol use: Yes    Comment: occassionally  . Drug use: No    Family History  Problem Relation Age of Onset  . Hypertension Mother   . Heart Problems Mother   . Breast cancer Neg Hx     Review of Systems: A 12-system review of systems was performed and was negative except as noted in the HPI.  --------------------------------------------------------------------------------------------------  Physical Exam: BP 140/70 (BP Location: Left Arm, Patient Position: Sitting, Cuff Size: Large)   Pulse 81   Ht 5\' 6"  (1.676 m)   Wt 195 lb (88.5 kg)   SpO2 98%   BMI 31.47 kg/m   General:  NAD. Neck: No JVD or HJR. Lungs: Clear to auscultation bilaterally without wheezes or crackles. Heart: Regular rate and rhythm without murmurs, rubs, or gallops. Abdomen: Soft, nontender, nondistended. Extremities: No lower extremity edema.  EKG: Normal sinus rhythm with left atrial enlargement, septal infarct, and lateral ST/T  changes.  No significant change from prior tracing on 07/24/2020.  Lab Results  Component Value Date   WBC 10.4 05/01/2020   HGB 13.4 05/01/2020   HCT 40.8 05/01/2020   MCV 94.7 05/01/2020   PLT 319 05/01/2020    Lab Results  Component Value Date   NA 134 07/24/2020   K 4.7 07/24/2020   CL 93 (L) 07/24/2020   CO2 25 07/24/2020   BUN 17 07/24/2020   CREATININE 1.21 (H) 07/24/2020   GLUCOSE 341 (H) 07/24/2020   ALT 14 03/02/2020    Lab Results  Component Value Date   CHOL 113 03/02/2020   HDL 24 (L) 03/02/2020   LDLCALC 66 03/02/2020   TRIG 116 03/02/2020   CHOLHDL 4.7 03/02/2020    --------------------------------------------------------------------------------------------------  ASSESSMENT AND PLAN: Coronary artery disease with accelerating angina: Ms. Barretta notes increased chest pressure and dyspnea with exertion over the last several months.  Her EKG today shows lateral ST/T changes that are not new.  It is possible that carvedilol falling off of her medication list could be contributing to her increased symptoms.  We will plan to restart carvedilol 6.25 mg twice daily and arrange for a pharmacologic myocardial perfusion stress test to exclude high risk ischemia.  We will plan to continue aspirin and atorvastatin for secondary prevention as well as ongoing antianginal therapy with isosorbide mononitrate.  Hypertension: Blood pressure mildly elevated today and typically uncontrolled at home as well.  As above, we will add carvedilol 6.25 mg twice daily with continuation of isosorbide mononitrate and losartan.  Hyperlipidemia associated with type 2 diabetes mellitus: LDL at goal on last check almost a year ago.  Continue atorvastatin 80 mg daily.  Ongoing management of diabetes mellitus per PCP.  Follow-up: Return to clinic in 1 month.  Rubye Oaks, MD 02/09/2021 4:01 PM

## 2021-02-09 ENCOUNTER — Other Ambulatory Visit: Payer: Self-pay

## 2021-02-09 ENCOUNTER — Encounter: Payer: Self-pay | Admitting: Internal Medicine

## 2021-02-20 ENCOUNTER — Encounter: Payer: Self-pay | Admitting: Pulmonary Disease

## 2021-02-20 ENCOUNTER — Other Ambulatory Visit: Payer: Self-pay

## 2021-02-20 ENCOUNTER — Encounter
Admission: RE | Admit: 2021-02-20 | Discharge: 2021-02-20 | Disposition: A | Discharge status: Home / Self Care | Payer: Medicare HMO | Source: Ambulatory Visit | Attending: Internal Medicine | Admitting: Internal Medicine

## 2021-02-20 DIAGNOSIS — I25119 Atherosclerotic heart disease of native coronary artery with unspecified angina pectoris: Secondary | ICD-10-CM | POA: Diagnosis not present

## 2021-02-20 DIAGNOSIS — Z951 Presence of aortocoronary bypass graft: Secondary | ICD-10-CM | POA: Diagnosis present

## 2021-02-20 LAB — NM MYOCAR MULTI W/SPECT W/WALL MOTION / EF
Estimated workload: 1 METS
Exercise duration (min): 0 min
Exercise duration (sec): 0 s
LV dias vol: 113 mL (ref 46–106)
LV sys vol: 34 mL
MPHR: 158 {beats}/min
Peak HR: 101 {beats}/min
Percent HR: 63 %
Rest HR: 64 {beats}/min
TID: 1.08

## 2021-02-20 MED ORDER — TECHNETIUM TC 99M TETROFOSMIN IV KIT
30.0000 | PACK | Freq: Once | INTRAVENOUS | Status: AC | PRN
  Administered 2021-02-20: 32.737 via INTRAVENOUS

## 2021-02-20 MED ORDER — TECHNETIUM TC 99M TETROFOSMIN IV KIT
10.0000 | PACK | Freq: Once | INTRAVENOUS | Status: AC | PRN
  Administered 2021-02-20: 10.043 via INTRAVENOUS

## 2021-02-20 MED ORDER — REGADENOSON 0.4 MG/5ML IV SOLN
0.4000 mg | Freq: Once | INTRAVENOUS | Status: AC
  Administered 2021-02-20: 0.4 mg via INTRAVENOUS
  Filled 2021-02-20: qty 5

## 2021-02-21 ENCOUNTER — Telehealth: Payer: Self-pay | Admitting: Internal Medicine

## 2021-02-21 NOTE — Telephone Encounter (Signed)
End, Cristal Deer, MD  P [facility] Triage Please let Ms. Debruyne know that her stress test is normal without evidence of a new blockage. I suggest that she continue her current medications and let us know if her symptoms worsen.

## 2021-02-21 NOTE — Telephone Encounter (Signed)
Called to give the patient stress test results and Dr. Serita Kyle recommendation. [email or web address].

## 2021-02-21 NOTE — Telephone Encounter (Signed)
Patient made aware of stress test results with verbalized understanding. 

## 2021-02-22 ENCOUNTER — Other Ambulatory Visit: Payer: Self-pay | Admitting: Internal Medicine

## 2021-03-19 ENCOUNTER — Other Ambulatory Visit: Payer: Self-pay | Admitting: Internal Medicine

## 2021-03-19 DIAGNOSIS — E785 Hyperlipidemia, unspecified: Secondary | ICD-10-CM

## 2021-03-19 NOTE — Progress Notes (Signed)
Cardiology Office Note    Date:  03/20/2021   ID:  Caitlyn Herrera 05-09-1961, MRN [id number]  PCP:  [name]  Cardiologist:  Yvonne Kendall, MD  Electrophysiologist:  None   Chief Complaint: Follow-up  History of Present Illness:   Caitlyn Herrera is a 60 y.o. female with history of CAD s/p 3-vessel CABG in 10/2017, mild pulmonary hypertension,COPD secondary to tobacco use,DM2, HTN, HLD,intermittent medical adherence, and GERDwho presents for follow up of recent Lexiscan MPI.   She was admitted to the hospital in 10/2017 withanNSTEMI and underwent diagnostic LHC which revealed severe multivessel CAD. Echo showed normal LV systolic function. She subsequently underwent three-vessel CABG with LIMA to LAD, SVG to OM2, and SVG to RPDA. She required repeat LHC in 11/2017 in the setting of subsequent admission with mild troponin elevation and abnormal stress testing which showed 3 of 3 patent grafts with native multivessel disease including 80% stenosis at the anastomosis of the SVG to OM2. This area was not felt to be amenable to PCI and she was medically managed. Since her bypass, she has been evaluated multiple times for complaints of palpitations with cardiac monitoring being unrevealing in 5/2019and 04/2018. On both occasions, duration of monitor compliance was limited. She has also noted an intermittent productive cough that has been present since her CABG. She was placed on Protonix by ENT though did not tolerate this. She was seen virtually in 08/2018 and referred to pulmonology with symptoms felt to be mostly reflux in etiology. She has been evaluated by GI with recommendation to proceed with EGD and colonoscopy though these have not been completed. She has been evaluated by the lipid clinic with recommendation to improve dietary lifestyle and repeat lipid testing with consideration for PCSK9 inhibitor if LDL remained above goal, though was  lost to follow up. She was seen in the office in 02/2020 continuing to note shortness of breath and occasional palpitations. Symptoms were largely unchanged when compared to symptoms noted since her CABG. Imdur 15 mg daily was added. Her cath from 11/2017 was reviewed by interventional cardiology and it was noted large grafts were anastomosed to very small diseased native vessels. Her shortness of breath was felt to be multifactorial including underlying CAD, COPD, and GERD.  She wasseen in the ED on 05/01/2020 for fever of 102.3, cough, and chest discomfort.Blood cultures negative x2. COVID-19 negative. High-sensitivity troponin 11with a delta of 8. Chest x-ray with no active cardiopulmonary disease. EKG showed sinus tachycardia, 102 bpm, diffuse ST-T changes that were largely unchanged when compared to prior.She was diagnosed with bronchitis, treated with prednisone and Tussionex.  Due to her longstanding dyspnea, she underwent echo in 05/2020 which demonstrated an EF of 60 to 65%, no regional wall motion abnormalities, mild LVH, normal LV diastolic function parameters, normal RV systolic function and ventricular cavity size, mildly elevated PASP at 37.3 mmHg, and mild mitral regurgitation.  She was last seen in the office in 01/2021 noting more chest tightness and shortness of breath over the preceding several months.  It was noted her chest discomfort had previously been well controlled following the addition of isosorbide.  EKG showed sinus rhythm with lateral ST-T changes that were not new.  It was noted carvedilol had fallen off her medication list with details being unclear, and this may have contributed to some of her symptoms.  She was restarted on on Coreg at 6.25 mg twice daily.  She underwent Lexiscan MPI on 02/20/2021 which  showed no significant ischemia or scar with an EF of 57% and was overall a low risk study.  She comes in doing well from a cardiac perspective.  Since she was last  seen, following the reinitiation of carvedilol, she has noted an improvement in her chest pain.  She does continue to experience randomly occurring left-sided sharp chest discomfort that will last for several minutes and spontaneously resolve, however these episodes are less frequent when compared to her last visit.  Symptoms do not feel similar to her prior angina.  Symptoms are nonexertional.  At times they are exacerbated with coughing.  She does not have any associated symptoms.  She is currently chest pain-free.  She is tolerating all cardiac medications without issues.  She does continue to smoke half pack of cigarettes per day.  She does not have any issues or concerns at this time.   Labs independently reviewed: 06/2020 - BUN 17, serum creatinine 1.21, potassium 4.7, glucose 341 04/2020-Hgb 13.4, PLT 319 02/2020-TC 113, TG 116, HDL 24, LDL 66, albumin 3.8, AST/ALTnormal  Past Medical History:  Diagnosis Date  . Abdominal aortic ectasia (HCC)    a. 11/2017 CTA chest/abd/pelvis: 2.5 cm abd ao ectasia -->rec f/u u/s in 5 yrs.  [name] CAD (coronary artery disease)    a. 11/04/17 Cath: Native multivessel dzs-->CABG x 3 (LIMA->LAD, VG->OM2, VG->RPDA; b. 11/2017 MV: mid antlat/apical isch; c. 11/2017 Cath: LM 30d, LAD 16m, LCX 80p/m, OM1 90, OM2 80 (@ anastamosis of graft), RCA 80p, 158m, 90d, LIMA->LAD nl, VG->OM2 nl, VG->RPDA nl-->Med Rx.  . Carotid arterial disease (HCC)    a. 10/2017 Carotid U/S: 1-30% bilat ICA stenosis.  [name] History of echocardiogram    a. 11/04/2017 Echo: EF of 65-70%, no RWMA, nl LV diastolic fxn, nl RV size/fxn, mild TR; b. 03/2018 Echo: EF 60-65%, mild LVH, no rwma, mild MR. Mildy reduced RV fxn. Mild TR.  [name] History of Tobacco abuse   . Hyperlipidemia   . Hypertension   . Left renal artery stenosis (HCC)    a. 11/2017 CTA Chest/Abd/Pelvis: 50-70% L RA stenosis.  . Palpitations    a. 02/2018 Event Monitor: wore for 2 days, 21 hrs - rare PAC's/PVC's. Avg HR 66 (42-103); b. 04/2018  Event Monitor: Wore for 14 days. RSR, 67 (49-111). No significant arrhythmias.  . Persistent cough   . Pulmonary nodule, right    a. 10/2017 CT Chest: 30mm pulm nodule in R lung apex - rec f/u w/ non-contrast chest CT in 1 year.  . Type II diabetes mellitus (HCC)    a. 10/2017 A1c 6.6.    Past Surgical History:  Procedure Laterality Date  . BREAST BIOPSY Right 06/23/2018   US guided biopsy - awaiting pathology  . BREAST CYST EXCISION Right   . CARDIAC CATHETERIZATION    . CORONARY ARTERY BYPASS GRAFT N/A 11/12/2017   Procedure: CORONARY ARTERY BYPASS GRAFTING (CABG) x Three , using left internal mammary artery and right leg greater saphenous vein harvested endoscopically;  Surgeon: Kerin Perna, MD;  Location: [facility] OR;  Service: Open Heart Surgery;  Laterality: N/A;  . DILATION AND CURETTAGE OF UTERUS    . LAPAROSCOPIC CHOLECYSTECTOMY    . LEFT HEART CATH AND CORONARY ANGIOGRAPHY N/A 11/04/2017   Procedure: LEFT HEART CATH AND CORONARY ANGIOGRAPHY;  Surgeon: Yvonne Kendall, MD;  Location: [facility] INVASIVE CV LAB;  Service: Cardiovascular;  Laterality: N/A;  . LEFT HEART CATH AND CORS/GRAFTS ANGIOGRAPHY N/A 12/09/2017   Procedure: LEFT HEART CATH AND  CORS/GRAFTS ANGIOGRAPHY;  Surgeon: [name], Peter M, MD;  Location: [facility] INVASIVE CV LAB;  Service: Cardiovascular;  Laterality: N/A;  . TEE WITHOUT CARDIOVERSION N/A 11/12/2017   Procedure: TRANSESOPHAGEAL ECHOCARDIOGRAM (TEE);  Surgeon: Donata Clay, Theron Arista, MD;  Location: [facility] OR;  Service: Open Heart Surgery;  Laterality: N/A;    Current Medications: Current Meds  Medication Sig  . albuterol (PROVENTIL HFA;VENTOLIN HFA) 108 (90 Base) MCG/ACT inhaler Inhale 2 puffs into the lungs every 6 (six) hours as needed for wheezing or shortness of breath.  [name] aspirin EC 81 MG tablet Take 1 tablet (81 mg total) by mouth daily.  [name] atorvastatin (LIPITOR) 80 MG tablet TAKE 1 TABLET(80 MG) BY MOUTH DAILY AT 6 PM  . azelastine (ASTELIN) 0.1 % nasal spray Place 2 sprays  into both nostrils as needed.  . benzonatate (TESSALON) 100 MG capsule Take 100 mg by mouth as needed.   . carvedilol (COREG) 6.25 MG tablet Take 1 tablet (6.25 mg total) by mouth 2 (two) times daily.  . cetirizine (ZYRTEC) 10 MG tablet Take 10 mg by mouth daily.  . chlorpheniramine-HYDROcodone (TUSSIONEX PENNKINETIC ER) 10-8 MG/5ML SUER Take 5 mLs by mouth 2 (two) times daily.  . citalopram (CELEXA) 20 MG tablet Take 20 mg by mouth daily.  . cyclobenzaprine (FLEXERIL) 10 MG tablet as needed.  [name] dextromethorphan-guaiFENesin (ROBITUSSIN-DM) 10-100 MG/5ML liquid as needed.  . doxycycline (MONODOX) 100 MG capsule Take 100 mg by mouth 2 (two) times daily.  [name] ezetimibe (ZETIA) 10 MG tablet TAKE 1 TABLET(10 MG) BY MOUTH DAILY  . fluticasone (FLONASE) 50 MCG/ACT nasal spray as needed.  . fluticasone-salmeterol (ADVAIR) 250-50 MCG/ACT AEPB Inhale 2 puffs into the lungs in the morning and at bedtime.  . Fluticasone-Umeclidin-Vilant (TRELEGY ELLIPTA) 100-62.5-25 MCG/INH AEPB Inhale into the lungs.  . isosorbide mononitrate (IMDUR) 60 MG 24 hr tablet Take 1 tablet (60 mg total) by mouth daily.  [name] lidocaine (XYLOCAINE) 5 % ointment Apply 1 application topically daily as needed.  [name] losartan (COZAAR) 100 MG tablet Take 100 mg by mouth daily.  . metFORMIN (GLUCOPHAGE-XR) 500 MG 24 hr tablet Take 500 mg by mouth daily.  . nitroGLYCERIN (NITROSTAT) 0.4 MG SL tablet Place 1 tablet (0.4 mg total) under the tongue every 5 (five) minutes as needed for chest pain.  [name] omeprazole (PRILOSEC) 40 MG capsule TAKE 1 CAPSULE BY MOUTH EVERY DAY BEFORE BREAKFAST  . ondansetron (ZOFRAN) 4 MG tablet Take 1 tablet (4 mg total) by mouth every 8 (eight) hours as needed for nausea or vomiting.  [name] oxybutynin (DITROPAN-XL) 5 MG 24 hr tablet TK 1T PO QD FOR INCONTINENCE  . traMADol (ULTRAM) 50 MG tablet Take 50 mg by mouth every 6 (six) hours as needed.  . [DISCONTINUED] isosorbide mononitrate (IMDUR) 30 MG 24 hr tablet TAKE 1  TABLET(30 MG) BY MOUTH AT BEDTIME    Allergies:   Patient has no known allergies.   Social History   Socioeconomic History  . Marital status: Widowed    Spouse name: Not on file  . Number of children: Not on file  . Years of education: Not on file  . Highest education level: Not on file  Occupational History    Employer: [employer]  Tobacco Use  . Smoking status: Current Some Day Smoker    Packs/day: 1.00    Years: 42.00    Pack years: 42.00    Types: Cigarettes, Cigars    Last attempt to quit: 11/01/2017    Years since  quitting: 3.3  . Smokeless tobacco: Never Used  . Tobacco comment: 3 cigars daily--12/13/2020  Vaping Use  . Vaping Use: Never used  Substance and Sexual Activity  . Alcohol use: Yes    Comment: occassionally  . Drug use: No  . Sexual activity: Never  Other Topics Concern  . Not on file  Social History Narrative  . Not on file   Social Determinants of Health   Financial Resource Strain: Not on file  Food Insecurity: Not on file  Transportation Needs: Not on file  Physical Activity: Not on file  Stress: Not on file  Social Connections: Not on file     Family History:  The patient's family history includes Heart Problems in her mother; Hypertension in her mother. There is no history of Breast cancer.  ROS:   Review of Systems  Constitutional: Negative for chills, diaphoresis, fever, malaise/fatigue and weight loss.  HENT: Negative for congestion.   Eyes: Negative for discharge and redness.  Respiratory: Negative for cough, sputum production, shortness of breath and wheezing.   Cardiovascular: Positive for chest pain. Negative for palpitations, orthopnea, claudication, leg swelling and PND.  Gastrointestinal: Negative for abdominal pain, heartburn, nausea and vomiting.  Musculoskeletal: Negative for falls and myalgias.  Skin: Negative for rash.  Neurological: Negative for dizziness, tingling, tremors, sensory change, speech change, focal  weakness, loss of consciousness and weakness.  Endo/Heme/Allergies: Does not bruise/bleed easily.  Psychiatric/Behavioral: Negative for substance abuse. The patient is not nervous/anxious.   All other systems reviewed and are negative.    EKGs/Labs/Other Studies Reviewed:    Studies reviewed were summarized above. The additional studies were reviewed today:  Lexiscan MPI 01/2021:  Normal pharmacologic myocardial perfusion stress test without evidence of significant ischemia or scar.  The left ventricular ejection fraction is normal (57%) with normal wall motion.  This is a low risk study.  Attenuation correction CT demonstrates post CABG findings __________  2D echo 05/2020: 1. Left ventricular ejection fraction, by estimation, is 60 to 65%. The  left ventricle has normal function. The left ventricle has no regional  wall motion abnormalities. There is mild left ventricular hypertrophy.  Left ventricular diastolic parameters  were normal.  2. Right ventricular systolic function is normal. The right ventricular  size is normal. There is mildly elevated pulmonary artery systolic  pressure. The estimated right ventricular systolic pressure is 37.3 mmHg.  3. Mild mitral valve regurgitation. __________  Luci BankZio patch 04/2018:  The patient was enrolled for 14 days. 20% of the monitoring period yielded diagnostic tracings.  The predominant rhythm was sinus with an average rate of 67 bpm (range 49-111 bpm).  No significant arrhythmia was observed.  Sinus rhythm without significant arrhythmias. Study significant limited by short monitoring period (only 20% of monitoring period yielded diagnostic tracings). __________  2D echo 03/2018: - Left ventricle: The cavity size was at the upper limits of  normal. Wall thickness was increased in a pattern of mild LVH.  Systolic function was normal. The estimated ejection fraction was  in the range of 60% to 65%. Wall motion was  normal; there were no  regional wall motion abnormalities. Left ventricular diastolic  function parameters were normal.  - Mitral valve: There was mild regurgitation.  - Right ventricle: The cavity size was normal. Systolic function  was mildly reduced.  - Tricuspid valve: There was mild regurgitation. __________  Luci BankZio patch 02/2018:  The patient was monitored for 2 days, 21 hours.  The predominant rhythm was sinus  with an average rate of 66 bpm (range 42-103).  Rare PAC's and PVC's were noted.  There were no sustained arrhythmias or prolonged pauses.  Sinus rhythm with rare PAC's and PVC's. Brief monitoring period (<3 days). __________  LHC 11/2017:  Dist LM lesion is 30% stenosed.  Mid LAD lesion is 95% stenosed.  Lat 1st Mrg lesion is 90% stenosed.  Prox Cx to Mid Cx lesion is 80% stenosed.  2nd Mrg lesion is 80% stenosed.  Prox RCA lesion is 80% stenosed.  Mid RCA lesion is 100% stenosed.  Dist RCA lesion is 90% stenosed.  LIMA graft was visualized by angiography and is normal in caliber.  The graft exhibits no disease.  SVG graft was visualized by angiography and is normal in caliber.  The graft exhibits no disease.  SVG graft was visualized by angiography and is very large.  The graft exhibits no disease.  LV end diastolic pressure is normal.  1. Severe 3 vessel occlusive CAD.  - 95% mid LAD - 90% first OM. This is a small caliber vessel and was not bypassed. - 80-85% anastomotic lesion at the insertion of the SVG into OM 2. - 100% mid RCA 2. Patent LIMA to the LAD 3. Patent SVG to OM 2 4. Patent SVG to PDA 5. Normal LVEDP.  Plan: continue medical therapy. Perhaps the Myoview findings could be explained base on disease in OM1. While there is an anastomotic lesion at the touch down of the SVG to OM2 there is a large graft to native vessel size differential that makes this unfavorable for PCI. I think it is unlikely that this is  the cause of her symptoms.   EKG:  EKG is not ordered today given recent Lexiscan MPI.  Recent Labs: 05/01/2020: Hemoglobin 13.4; Platelets 319 07/24/2020: BUN 17; Creatinine, Ser 1.21; Potassium 4.7; Sodium 134  Recent Lipid Panel    Component Value Date/Time   CHOL 113 03/02/2020 1623   CHOL 103 12/17/2017 1015   TRIG 116 03/02/2020 1623   HDL 24 (L) 03/02/2020 1623   HDL 31 (L) 12/17/2017 1015   CHOLHDL 4.7 03/02/2020 1623   VLDL 23 03/02/2020 1623   LDLCALC 66 03/02/2020 1623   LDLCALC 56 12/17/2017 1015    PHYSICAL EXAM:    VS:  BP 130/74 (BP Location: Left Arm, Patient Position: Sitting, Cuff Size: Large)   Pulse 75   Ht  (1.702 m)   Wt 198 lb (89.8 kg)   SpO2 98%   BMI 31.01 kg/m   BMI: Body mass index is 31.01 kg/m.  Physical Exam Vitals reviewed.  Constitutional:      Appearance: She is well-developed.  HENT:     Head: Normocephalic and atraumatic.  Eyes:     General:        Right eye: No discharge.        Left eye: No discharge.  Neck:     Vascular: No JVD.  Cardiovascular:     Rate and Rhythm: Normal rate and regular rhythm.     Pulses: No midsystolic click and no opening snap.          Posterior tibial pulses are 2+ on the right side and 2+ on the left side.     Heart sounds: Normal heart sounds, S1 normal and S2 normal. Heart sounds not distant. No murmur heard. No friction rub.  Pulmonary:     Effort: Pulmonary effort is normal. No respiratory distress.     Breath sounds:  Normal breath sounds. No decreased breath sounds, wheezing or rales.  Chest:     Chest wall: No tenderness.  Abdominal:     General: There is no distension.     Palpations: Abdomen is soft.     Tenderness: There is no abdominal tenderness.  Musculoskeletal:     Cervical back: Normal range of motion.  Skin:    General: Skin is warm and dry.     Nails: There is no clubbing.  Neurological:     Mental Status: She is alert and oriented to person, place, and time.   Psychiatric:        Speech: Speech normal.        Behavior: Behavior normal.        Thought Content: Thought content normal.        Judgment: Judgment normal.     Wt Readings from Last 3 Encounters:  03/20/21 198 lb (89.8 kg)  02/08/21 195 lb (88.5 kg)  01/09/21 190 lb (86.2 kg)     ASSESSMENT & PLAN:   1. CAD status post CABG with stable angina: Symptoms did improve somewhat following reinitiation of carvedilol.  Currently chest pain-free.  Recent Lexiscan MPI showed no evidence of ischemia and was overall low risk.  Historically, symptoms have improved with addition of isosorbide mononitrate.  Given reassuring Lexiscan we will proceed with escalation of medical therapy with titration of Imdur to 60 mg daily.  She will continue carvedilol 6.25 mg twice daily.  When she is seen in follow-up could consider further titration of Imdur or carvedilol.  She will otherwise continue secondary prevention with aspirin, atorvastatin, and ezetimibe.  No indication for further ischemic testing at this time.  2. HTN: Blood pressure is reasonably controlled today.  Titrate Imdur as outlined above.  Otherwise, she will continue carvedilol and losartan.  Low-sodium diet recommended.  3. HLD: LDL of 66 from 02/2020.  She remains on atorvastatin 80 mg and ezetimibe.  Check lipid panel, direct LDL, and CMP.  4. Pulmonary hypertension: Likely in the setting of underlying COPD with ongoing tobacco use.  She is not requiring a standing diuretic.  5. COPD with ongoing tobacco use: Stable without acute exacerbation.  Complete cessation of tobacco is recommended.  Follow-up with PCP as directed.  Disposition: F/u with Dr. Okey Dupre or an APP in 1 month.   Medication Adjustments/Labs and Tests Ordered: Current medicines are reviewed at length with the patient today.  Concerns regarding medicines are outlined above. Medication changes, Labs and Tests ordered today are summarized above and listed in the Patient  Instructions accessible in Encounters.   Signed, Eula Listen, PA-C 03/20/2021 4:00 PM     [facility] [place] [address] [place], [place] [address] [phone]

## 2021-03-20 ENCOUNTER — Encounter: Payer: Self-pay | Admitting: Physician Assistant

## 2021-03-20 ENCOUNTER — Ambulatory Visit (INDEPENDENT_AMBULATORY_CARE_PROVIDER_SITE_OTHER): Payer: Medicare HMO | Admitting: Physician Assistant

## 2021-03-20 ENCOUNTER — Other Ambulatory Visit: Payer: Self-pay

## 2021-03-20 VITALS — BP 130/74 | HR 75 | Ht 67.0 in | Wt 198.0 lb

## 2021-03-20 DIAGNOSIS — I10 Essential (primary) hypertension: Secondary | ICD-10-CM | POA: Diagnosis not present

## 2021-03-20 DIAGNOSIS — E785 Hyperlipidemia, unspecified: Secondary | ICD-10-CM | POA: Diagnosis not present

## 2021-03-20 DIAGNOSIS — I272 Pulmonary hypertension, unspecified: Secondary | ICD-10-CM

## 2021-03-20 DIAGNOSIS — Z951 Presence of aortocoronary bypass graft: Secondary | ICD-10-CM | POA: Diagnosis not present

## 2021-03-20 DIAGNOSIS — I25119 Atherosclerotic heart disease of native coronary artery with unspecified angina pectoris: Secondary | ICD-10-CM | POA: Diagnosis not present

## 2021-03-20 DIAGNOSIS — I25118 Atherosclerotic heart disease of native coronary artery with other forms of angina pectoris: Secondary | ICD-10-CM | POA: Diagnosis not present

## 2021-03-20 DIAGNOSIS — J449 Chronic obstructive pulmonary disease, unspecified: Secondary | ICD-10-CM

## 2021-03-20 MED ORDER — ISOSORBIDE MONONITRATE ER 60 MG PO TB24: 60.0000 mg | ORAL_TABLET | Freq: Every day | ORAL | 3 refills | Status: DC

## 2021-03-20 NOTE — Patient Instructions (Signed)
Medication Instructions:  Your physician has recommended you make the following change in your medication:   1. INCREASE Isosorbide Mononitrate 60 mg once daily  *If you need a refill on your cardiac medications before your next appointment, please call your pharmacy*   Lab Work: Cmet, Lipid, and Direct LDL  If you have labs (blood work) drawn today and your tests are completely normal, you will receive your results only by: [name] MyChart Message (if you have MyChart) OR . A paper copy in the mail If you have any lab test that is abnormal or we need to change your treatment, we will call you to review the results.   Testing/Procedures: None   Follow-Up: At [facility], you and your health needs are our priority.  As part of our continuing mission to provide you with exceptional heart care, we have created designated Provider Care Teams.  These Care Teams include your primary Cardiologist (physician) and Advanced Practice Providers (APPs -  Physician Assistants and Nurse Practitioners) who all work together to provide you with the care you need, when you need it.   Your next appointment:   1 month(s)  The format for your next appointment:   In Person  Provider:   You may see Yvonne Kendall, MD or one of the following Advanced Practice Providers on your designated Care Team:    Nicolasa Ducking, NP  Eula Listen, PA-C  Marisue Ivan, PA-C  C[place]ence [place], [place]  Gillian Shields, NP

## 2021-03-21 LAB — HEPATIC FUNCTION PANEL
ALT: 9 IU/L (ref 0–32)
AST: 15 IU/L (ref 0–40)
Albumin: 4.1 g/dL (ref 3.8–4.8)
Alkaline Phosphatase: 78 IU/L (ref 44–121)
Bilirubin Total: 0.2 mg/dL (ref 0.0–1.2)
Bilirubin, Direct: <0.1 mg/dL (ref 0.00–0.40)
Total Protein: 6.7 g/dL (ref 6.0–8.5)

## 2021-03-21 LAB — LIPID PANEL
Chol/HDL Ratio: 3.5 ratio (ref 0.0–4.4)
Cholesterol, Total: 119 mg/dL (ref 100–199)
HDL: 34 mg/dL — ABNORMAL LOW (ref >39)
LDL Chol Calc (NIH): 63 mg/dL (ref 0–99)
Triglycerides: 124 mg/dL (ref 0–149)
VLDL Cholesterol Cal: 22 mg/dL (ref 5–40)

## 2021-03-21 LAB — LDL CHOLESTEROL, DIRECT: LDL Direct: 62 mg/dL (ref 0–99)

## 2021-03-29 ENCOUNTER — Other Ambulatory Visit: Payer: Self-pay | Admitting: Physician Assistant

## 2021-03-29 DIAGNOSIS — I25118 Atherosclerotic heart disease of native coronary artery with other forms of angina pectoris: Secondary | ICD-10-CM

## 2021-04-19 NOTE — Progress Notes (Deleted)
Cardiology Office Note    Date:  04/19/2021   ID:  Caitlyn, Herrera 12-04-1960, MRN [id number]  PCP:  [name]  Cardiologist:  Yvonne Kendall, MD  Electrophysiologist:  None   Chief Complaint: Follow-up  History of Present Illness:   Caitlyn Herrera is a 60 y.o. female with history of CAD s/p 3-vessel CABG in 10/2017, mild pulmonary hypertension, COPD secondary to tobacco use, DM2, HTN, HLD, intermittent medical adherence, and GERD who presents for follow up of ***.   She was admitted to the hospital in 10/2017 with an NSTEMI and underwent diagnostic LHC which revealed severe multivessel CAD.  Echo showed normal LV systolic function.  She subsequently underwent three-vessel CABG with LIMA to LAD, SVG to OM2, and SVG to RPDA.  She required repeat LHC in 11/2017 in the setting of subsequent admission with mild troponin elevation and abnormal stress testing which showed 3 of 3 patent grafts with native multivessel disease including 80% stenosis at the anastomosis of the SVG to OM2.  This area was not felt to be amenable to PCI and she was medically managed.  Since her bypass, she has been evaluated multiple times for complaints of palpitations with cardiac monitoring being unrevealing in 02/2018 and 04/2018.  On both occasions, duration of monitor compliance was limited.  She has also noted an intermittent productive cough that has been present since her CABG.  She was placed on Protonix by ENT though did not tolerate this.  She was seen virtually in 08/2018 and referred to pulmonology with symptoms felt to be mostly reflux in etiology.  She has been evaluated by GI with recommendation to proceed with EGD and colonoscopy though these have not been completed.  She has been evaluated by the lipid clinic with recommendation to improve dietary lifestyle and repeat lipid testing with consideration for PCSK9 inhibitor if LDL remained above goal, though was lost to follow  up.  She was seen in the office in 02/2020 continuing to note shortness of breath and occasional palpitations.  Symptoms were largely unchanged when compared to symptoms noted since her CABG.  Imdur 15 mg daily was added.  Her cath from 11/2017 was reviewed by interventional cardiology and it was noted large grafts were anastomosed to very small diseased native vessels.  Her shortness of breath was felt to be multifactorial including underlying CAD, COPD, and GERD.  She was seen in the ED on 05/01/2020 for fever of 102.3, cough, and chest discomfort.  Blood cultures negative x2.  COVID-19 negative.  High-sensitivity troponin 11 with a delta of 8.  Chest x-ray with no active cardiopulmonary disease.  EKG showed sinus tachycardia, 102 bpm, diffuse ST-T changes that were largely unchanged when compared to prior.  She was diagnosed with bronchitis, treated with prednisone and Tussionex.   Due to her longstanding dyspnea, she underwent echo in 05/2020 which demonstrated an EF of 60 to 65%, no regional wall motion abnormalities, mild LVH, normal LV diastolic function parameters, normal RV systolic function and ventricular cavity size, mildly elevated PASP at 37.3 mmHg, and mild mitral regurgitation.   She was seen in the office in 01/2021 noting more chest tightness and shortness of breath over the preceding several months.  It was noted her chest discomfort had previously been well controlled following the addition of isosorbide.  EKG showed sinus rhythm with lateral ST-T changes that were not new.  It was noted carvedilol had fallen off her medication list with details  being unclear, and this may have contributed to some of her symptoms.  She was restarted on on Coreg at 6.25 mg twice daily.  She underwent Lexiscan MPI on 02/20/2021 which showed no significant ischemia or scar with an EF of 57% and was overall a low risk study.  She was last seen in the office in 02/2021 noting that since the reinitiation of carvedilol  and improvement in her chest discomfort.  She did continue to note randomly occurring left-sided sharp chest discomfort that would last for several minutes and spontaneously resolve, though these were less frequent and did not feel similar to her prior angina.  She did continue to smoke 1/2 pack of cigarettes per day.  Imdur was titrated to 60 mg daily.  ***     Labs independently reviewed: 02/2021 - direct LDL 62, TC 119, TG 124, HDL 34, albumin 4.1, AST/ALT normal 06/2020 - BUN 17, serum creatinine 1.21, potassium 4.7, glucose 341 04/2020 - Hgb 13.4, PLT 319    Past Medical History:  Diagnosis Date   Abdominal aortic ectasia (HCC)    a. 11/2017 CTA chest/abd/pelvis: 2.5 cm abd ao ectasia -->rec f/u u/s in 5 yrs.   CAD (coronary artery disease)    a. 11/04/17 Cath: Native multivessel dzs-->CABG x 3 (LIMA->LAD, VG->OM2, VG->RPDA; b. 11/2017 MV: mid antlat/apical isch; c. 11/2017 Cath: LM 30d, LAD 37m, LCX 80p/m, OM1 90, OM2 80 (@ anastamosis of graft), RCA 80p, 136m, 90d, LIMA->LAD nl, VG->OM2 nl, VG->RPDA nl-->Med Rx.   Carotid arterial disease (HCC)    a. 10/2017 Carotid U/S: 1-30% bilat ICA stenosis.   History of echocardiogram    a. 11/04/2017 Echo: EF of 65-70%, no RWMA, nl LV diastolic fxn, nl RV size/fxn, mild TR; b. 03/2018 Echo: EF 60-65%, mild LVH, no rwma, mild MR. Mildy reduced RV fxn. Mild TR.   History of Tobacco abuse    Hyperlipidemia    Hypertension    Left renal artery stenosis (HCC)    a. 11/2017 CTA Chest/Abd/Pelvis: 50-70% L RA stenosis.   Palpitations    a. 02/2018 Event Monitor: wore for 2 days, 21 hrs - rare PAC's/PVC's. Avg HR 66 (42-103); b. 04/2018 Event Monitor: Wore for 14 days. RSR, 67 (49-111). No significant arrhythmias.   Persistent cough    Pulmonary nodule, right    a. 10/2017 CT Chest: 47mm pulm nodule in R lung apex - rec f/u w/ non-contrast chest CT in 1 year.   Type II diabetes mellitus (HCC)    a. 10/2017 A1c 6.6.    Past Surgical History:  Procedure  Laterality Date   BREAST BIOPSY Right 06/23/2018   US guided biopsy - awaiting pathology   BREAST CYST EXCISION Right    CARDIAC CATHETERIZATION     CORONARY ARTERY BYPASS GRAFT N/A 11/12/2017   Procedure: CORONARY ARTERY BYPASS GRAFTING (CABG) x Three , using left internal mammary artery and right leg greater saphenous vein harvested endoscopically;  Surgeon: Kerin Perna, MD;  Location: [facility] OR;  Service: Open Heart Surgery;  Laterality: N/A;   DILATION AND CURETTAGE OF UTERUS     LAPAROSCOPIC CHOLECYSTECTOMY     LEFT HEART CATH AND CORONARY ANGIOGRAPHY N/A 11/04/2017   Procedure: LEFT HEART CATH AND CORONARY ANGIOGRAPHY;  Surgeon: Yvonne Kendall, MD;  Location: [facility] INVASIVE CV LAB;  Service: Cardiovascular;  Laterality: N/A;   LEFT HEART CATH AND CORS/GRAFTS ANGIOGRAPHY N/A 12/09/2017   Procedure: LEFT HEART CATH AND CORS/GRAFTS ANGIOGRAPHY;  Surgeon: [name], Peter M, MD;  Location:  [facility] INVASIVE CV LAB;  Service: Cardiovascular;  Laterality: N/A;   TEE WITHOUT CARDIOVERSION N/A 11/12/2017   Procedure: TRANSESOPHAGEAL ECHOCARDIOGRAM (TEE);  Surgeon: [name], [name], MD;  [facility];  Service: Open Heart Surgery;  Laterality: N/A;    Current Medications: No outpatient medications have been marked as taking for the 04/20/21 encounter (Appointment) with Sondra Bargesunn, Kyle Stansell M, PA-C.    Allergies:   Patient has no known allergies.   Social History   Socioeconomic History   Marital status: Widowed    Spouse name: Not on file   Number of children: Not on file   Years of education: Not on file   Highest education level: Not on file  Occupational History    Employer: [employer]  Tobacco Use   Smoking status: Some Days    Packs/day: 1.00    Years: 42.00    Pack years: 42.00    Types: Cigarettes, Cigars    Last attempt to quit: 11/01/2017    Years since quitting: 3.4   Smokeless tobacco: Never   Tobacco comments:    3 cigars daily--12/13/2020  Vaping Use   Vaping Use: Never used   Substance and Sexual Activity   Alcohol use: Yes    Comment: occassionally   Drug use: No   Sexual activity: Never  Other Topics Concern   Not on file  Social History Narrative   Not on file   Social Determinants of Health   Financial Resource Strain: Not on file  Food Insecurity: Not on file  Transportation Needs: Not on file  Physical Activity: Not on file  Stress: Not on file  Social Connections: Not on file     Family History:  The patient's family history includes Heart Problems in her mother; Hypertension in her mother. There is no history of Breast cancer.  ROS:   ROS   EKGs/Labs/Other Studies Reviewed:    Studies reviewed were summarized above. The additional studies were reviewed today:  Lexiscan MPI 01/2021: Normal pharmacologic myocardial perfusion stress test without evidence of significant ischemia or scar. The left ventricular ejection fraction is normal (57%) with normal wall motion. This is a low risk study. Attenuation correction CT demonstrates post CABG findings __________   2D echo 05/2020: 1. Left ventricular ejection fraction, by estimation, is 60 to 65%. The  left ventricle has normal function. The left ventricle has no regional  wall motion abnormalities. There is mild left ventricular hypertrophy.  Left ventricular diastolic parameters  were normal.   2. Right ventricular systolic function is normal. The right ventricular  size is normal. There is mildly elevated pulmonary artery systolic  pressure. The estimated right ventricular systolic pressure is 37.3 mmHg.   3. Mild mitral valve regurgitation. __________   Luci BankZio patch 04/2018: The patient was enrolled for 14 days. 20% of the monitoring period yielded diagnostic tracings. The predominant rhythm was sinus with an average rate of 67 bpm (range 49-111 bpm). No significant arrhythmia was observed.   Sinus rhythm without significant arrhythmias.  Study significant limited by short  monitoring period (only 20% of monitoring period yielded diagnostic tracings). __________   2D echo 03/2018: - Left ventricle: The cavity size was at the upper limits of    normal. Wall thickness was increased in a pattern of mild LVH.    Systolic function was normal. The estimated ejection fraction was    in the range of 60% to 65%. Wall motion was normal; there were no    regional  wall motion abnormalities. Left ventricular diastolic    function parameters were normal.  - Mitral valve: There was mild regurgitation.  - Right ventricle: The cavity size was normal. Systolic function    was mildly reduced.  - Tricuspid valve: There was mild regurgitation. __________   Luci Bank patch 02/2018: The patient was monitored for 2 days, 21 hours. The predominant rhythm was sinus with an average rate of 66 bpm (range 42-103). Rare PAC's and PVC's were noted. There were no sustained arrhythmias or prolonged pauses.   Sinus rhythm with rare PAC's and PVC's.  Brief monitoring period (<3 days). __________   LHC 11/2017: Dist LM lesion is 30% stenosed. Mid LAD lesion is 95% stenosed. Lat 1st Mrg lesion is 90% stenosed. Prox Cx to Mid Cx lesion is 80% stenosed. 2nd Mrg lesion is 80% stenosed. Prox RCA lesion is 80% stenosed. Mid RCA lesion is 100% stenosed. Dist RCA lesion is 90% stenosed. LIMA graft was visualized by angiography and is normal in caliber. The graft exhibits no disease. SVG graft was visualized by angiography and is normal in caliber. The graft exhibits no disease. SVG graft was visualized by angiography and is very large. The graft exhibits no disease. LV end diastolic pressure is normal.   1. Severe 3 vessel occlusive CAD.    - 95% mid LAD    - 90% first OM. This is a small caliber vessel and was not bypassed.    - 80-85% anastomotic lesion at the insertion of the SVG into OM 2.    - 100% mid RCA 2. Patent LIMA to the LAD 3. Patent SVG to OM 2 4. Patent SVG to PDA 5. Normal  LVEDP.   Plan: continue medical therapy. Perhaps the Myoview findings could be explained base on disease in OM1. While there is an anastomotic lesion at the touch down of the SVG to OM2 there is a large graft to native vessel size differential that makes this unfavorable for PCI. I think it is unlikely that this is the cause of her symptoms.   EKG:  EKG is ordered today.  The EKG ordered today demonstrates ***  Recent Labs: 05/01/2020: Hemoglobin 13.4; Platelets 319 07/24/2020: BUN 17; Creatinine, Ser 1.21; Potassium 4.7; Sodium 134 03/20/2021: ALT 9  Recent Lipid Panel    Component Value Date/Time   CHOL 119 03/20/2021 1556   TRIG 124 03/20/2021 1556   HDL 34 (L) 03/20/2021 1556   CHOLHDL 3.5 03/20/2021 1556   CHOLHDL 4.7 03/02/2020 1623   VLDL 23 03/02/2020 1623   LDLCALC 63 03/20/2021 1556   LDLDIRECT 62 03/20/2021 1556    PHYSICAL EXAM:    VS:  There were no vitals taken for this visit.  BMI: There is no height or weight on file to calculate BMI.  Physical Exam  Wt Readings from Last 3 Encounters:  03/20/21 198 lb (89.8 kg)  02/08/21 195 lb (88.5 kg)  01/09/21 190 lb (86.2 kg)     ASSESSMENT & PLAN:   CAD status post CABG with***angina:  HTN: Blood pressure ***  HLD: LDL 62 from 02/2021.  ***  Pulmonary hypertension:  COPD with ongoing tobacco use:  Disposition: F/u with Dr. Okey Dupre or an APP in ***.   Medication Adjustments/Labs and Tests Ordered: Current medicines are reviewed at length with the patient today.  Concerns regarding medicines are outlined above. Medication changes, Labs and Tests ordered today are summarized above and listed in the Patient Instructions accessible in Encounters.   Signed, Alycia Rossetti  Naileah Karg, PA-C 04/19/2021 7:50 AM     [facility] [place] [address] [place], [place] [address] [phone]

## 2021-04-20 ENCOUNTER — Ambulatory Visit: Payer: Medicare HMO | Admitting: Physician Assistant

## 2021-04-21 ENCOUNTER — Other Ambulatory Visit: Payer: Self-pay | Admitting: Internal Medicine

## 2021-04-23 ENCOUNTER — Encounter: Payer: Self-pay | Admitting: Physician Assistant

## 2021-06-17 ENCOUNTER — Other Ambulatory Visit: Payer: Self-pay | Admitting: Internal Medicine

## 2021-06-17 DIAGNOSIS — E785 Hyperlipidemia, unspecified: Secondary | ICD-10-CM

## 2021-07-14 ENCOUNTER — Other Ambulatory Visit: Payer: Self-pay | Admitting: Internal Medicine

## 2021-07-14 DIAGNOSIS — E785 Hyperlipidemia, unspecified: Secondary | ICD-10-CM

## 2021-07-16 NOTE — Telephone Encounter (Signed)
Please reschedule office visit. Thank you!

## 2021-07-17 NOTE — Telephone Encounter (Signed)
Attempted to call - unable to LVM.

## 2021-08-07 ENCOUNTER — Other Ambulatory Visit: Payer: Self-pay | Admitting: Physician Assistant

## 2021-08-07 ENCOUNTER — Other Ambulatory Visit: Payer: Self-pay

## 2021-08-07 DIAGNOSIS — Z1231 Encounter for screening mammogram for malignant neoplasm of breast: Secondary | ICD-10-CM

## 2021-09-17 ENCOUNTER — Other Ambulatory Visit: Payer: Self-pay | Admitting: Internal Medicine

## 2021-10-12 ENCOUNTER — Other Ambulatory Visit: Payer: Self-pay | Admitting: Internal Medicine

## 2021-10-12 NOTE — Telephone Encounter (Signed)
Please schedule overdue F/U appointment for refills. Thank you! 

## 2021-10-12 NOTE — Telephone Encounter (Signed)
LVM to schedule appt

## 2021-11-07 ENCOUNTER — Other Ambulatory Visit: Payer: Self-pay

## 2021-11-07 ENCOUNTER — Ambulatory Visit (INDEPENDENT_AMBULATORY_CARE_PROVIDER_SITE_OTHER): Payer: Commercial Managed Care - HMO | Admitting: Internal Medicine

## 2021-11-07 ENCOUNTER — Encounter: Payer: Self-pay | Admitting: Internal Medicine

## 2021-11-07 VITALS — BP 92/60 | HR 59 | Ht 66.0 in | Wt 196.0 lb

## 2021-11-07 DIAGNOSIS — E785 Hyperlipidemia, unspecified: Secondary | ICD-10-CM

## 2021-11-07 DIAGNOSIS — I10 Essential (primary) hypertension: Secondary | ICD-10-CM | POA: Diagnosis not present

## 2021-11-07 DIAGNOSIS — I255 Ischemic cardiomyopathy: Secondary | ICD-10-CM | POA: Diagnosis not present

## 2021-11-07 DIAGNOSIS — E1169 Type 2 diabetes mellitus with other specified complication: Secondary | ICD-10-CM

## 2021-11-07 DIAGNOSIS — I25118 Atherosclerotic heart disease of native coronary artery with other forms of angina pectoris: Secondary | ICD-10-CM | POA: Diagnosis not present

## 2021-11-07 DIAGNOSIS — I214 Non-ST elevation (NSTEMI) myocardial infarction: Secondary | ICD-10-CM

## 2021-11-07 DIAGNOSIS — I2510 Atherosclerotic heart disease of native coronary artery without angina pectoris: Secondary | ICD-10-CM

## 2021-11-07 NOTE — Patient Instructions (Signed)
Medication Instructions:  ° °Your physician recommends that you continue on your current medications as directed. Please refer to the Current Medication list given to you today. ° °*If you need a refill on your cardiac medications before your next appointment, please call your pharmacy* ° ° °Lab Work: ° °None ordered ° °Testing/Procedures: ° °None ordered ° ° °Follow-Up: °At [facility], you and your health needs are our priority.  As part of our continuing mission to provide you with exceptional heart care, we have created designated Provider Care Teams.  These Care Teams include your primary Cardiologist (physician) and Advanced Practice Providers (APPs -  Physician Assistants and Nurse Practitioners) who all work together to provide you with the care you need, when you need it. ° °We recommend signing up for the patient portal called "MyChart".  Sign up information is provided on this After Visit Summary.  MyChart is used to connect with patients for Virtual Visits (Telemedicine).  Patients are able to view lab/test results, encounter notes, upcoming appointments, etc.  Non-urgent messages can be sent to your provider as well.   °To learn more about what you can do with MyChart, go to [email or web address].   ° °Your next appointment:   °6 month(s) ° °The format for your next appointment:   °In Person ° °Provider:   °You may see Christopher End, MD or one of the following Advanced Practice Providers on your designated Care Team:   °Christopher Berge, NP °Ryan Dunn, PA-C °Cadence Furth, PA-C °

## 2021-11-07 NOTE — Progress Notes (Signed)
Follow-up Outpatient Visit Date: 11/07/2021  Primary Care Provider[facility] [address] [place] [id number]  Chief Complaint: Follow-up coronary artery disease  HPI:  Caitlyn Herrera is a 61 y.o. 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  She was last seen in our office in 02/2021 by Eula Listenyan Dunn, PA, following myocardial perfusion stress test for assessment of chest tightness noted at visit in April.  Stress test was low risk without evidence of ischemia or scar.  She had been placed back on carvedilol at her previous visit with improvement in chest pain.  No medication changes or additional testing were pursued.  Today, Caitlyn Herrera reports that she is feeling fairly well.  She still notices heaviness in her chest when she first lies down at night.  She does not have any discomfort in her chest when she is upright or active.  This has been present ever since her bypass surgery in 2019.  She typically is able to fall asleep after few minutes and notes that the pain is gone when she wakes up.  She denies shortness of breath, palpitations, lightheadedness, and edema.  Home blood pressures are typically in the 130s to 140s systolic.  --------------------------------------------------------------------------------------------------  Past Medical History:  Diagnosis Date   Abdominal aortic ectasia (HCC)    a. 11/2017 CTA chest/abd/pelvis: 2.5 cm abd ao ectasia -->rec f/u u/s in 5 yrs.   CAD (coronary artery disease)    a. 11/04/17 Cath: Native multivessel dzs-->CABG x 3 (LIMA->LAD, VG->OM2, VG->RPDA; b. 11/2017 MV: mid antlat/apical isch; c. 11/2017 Cath: LM 30d, LAD 3853m, LCX 80p/m, OM1 90, OM2 80 (@ anastamosis of graft), RCA 80p, 15784m, 90d, LIMA->LAD nl, VG->OM2 nl, VG->RPDA nl-->Med Rx.    Carotid arterial disease (HCC)    a. 10/2017 Carotid U/S: 1-30% bilat ICA stenosis.   History of echocardiogram    a. 11/04/2017 Echo: EF of 65-70%, no RWMA, nl LV diastolic fxn, nl RV size/fxn, mild TR; b. 03/2018 Echo: EF 60-65%, mild LVH, no rwma, mild MR. Mildy reduced RV fxn. Mild TR.   History of Tobacco abuse    Hyperlipidemia    Hypertension    Left renal artery stenosis (HCC)    a. 11/2017 CTA Chest/Abd/Pelvis: 50-70% L RA stenosis.   Palpitations    a. 02/2018 Event Monitor: wore for 2 days, 21 hrs - rare PAC's/PVC's. Avg HR 66 (42-103); b. 04/2018 Event Monitor: Wore for 14 days. RSR, 67 (49-111). No significant arrhythmias.   Persistent cough    Pulmonary nodule, right    a. 10/2017 CT Chest: 5mm pulm nodule in R lung apex - rec f/u w/ non-contrast chest CT in 1 year.   Type II diabetes mellitus (HCC)    a. 10/2017 A1c 6.6.   Past Surgical History:  Procedure Laterality Date   BREAST BIOPSY Right 06/23/2018   US guided biopsy - awaiting pathology   BREAST CYST EXCISION Right    CARDIAC CATHETERIZATION     CORONARY ARTERY BYPASS GRAFT N/A 11/12/2017   Procedure: CORONARY ARTERY BYPASS GRAFTING (CABG) x Three , using left internal mammary artery and right leg greater saphenous vein harvested endoscopically;  Surgeon: [name], Peter, MD;  [facility];  Service: Open Heart Surgery;  Laterality: N/A;   DILATION AND CURETTAGE OF UTERUS     LAPAROSCOPIC  CHOLECYSTECTOMY     LEFT HEART CATH AND CORONARY ANGIOGRAPHY N/A 11/04/2017   Procedure: LEFT HEART CATH AND CORONARY ANGIOGRAPHY;  Surgeon: Yvonne Kendall, MD;  Location: [facility] INVASIVE CV LAB;  Service: Cardiovascular;  Laterality: N/A;   LEFT HEART CATH AND CORS/GRAFTS ANGIOGRAPHY N/A 12/09/2017   Procedure: LEFT HEART CATH AND CORS/GRAFTS ANGIOGRAPHY;  Surgeon: [name], Peter M, MD;  Location: [facility] INVASIVE CV LAB;  Service: Cardiovascular;  Laterality: N/A;   TEE WITHOUT CARDIOVERSION N/A 11/12/2017   Procedure: TRANSESOPHAGEAL  ECHOCARDIOGRAM (TEE);  Surgeon: Donata Clay, Theron Arista, MD;  Location: [facility] OR;  Service: Open Heart Surgery;  Laterality: N/A;    Current Meds  Medication Sig   albuterol (PROVENTIL HFA;VENTOLIN HFA) 108 (90 Base) MCG/ACT inhaler Inhale 2 puffs into the lungs every 6 (six) hours as needed for wheezing or shortness of breath.   aspirin EC 81 MG tablet Take 1 tablet (81 mg total) by mouth daily.   atorvastatin (LIPITOR) 80 MG tablet TAKE 1 TABLET(80 MG) BY MOUTH DAILY AT 6 PM   azelastine (ASTELIN) 0.1 % nasal spray Place 2 sprays into both nostrils as needed.   benzonatate (TESSALON) 100 MG capsule Take 100 mg by mouth as needed.    carvedilol (COREG) 6.25 MG tablet Take 1 tablet (6.25 mg total) by mouth 2 (two) times daily with a meal.   cetirizine (ZYRTEC) 10 MG tablet Take 10 mg by mouth daily.   chlorpheniramine-HYDROcodone (TUSSIONEX PENNKINETIC ER) 10-8 MG/5ML SUER Take 5 mLs by mouth 2 (two) times daily.   citalopram (CELEXA) 20 MG tablet Take 20 mg by mouth daily.   cyclobenzaprine (FLEXERIL) 10 MG tablet as needed.   dextromethorphan-guaiFENesin (ROBITUSSIN-DM) 10-100 MG/5ML liquid as needed.   doxycycline (MONODOX) 100 MG capsule Take 100 mg by mouth 2 (two) times daily.   ezetimibe (ZETIA) 10 MG tablet Take 1 tablet (10 mg total) by mouth daily. PLEASE SCHEDULE OFFICE VISIT FOR FURTHER REFILLS. THANK YOU! 3RD ATTEMPT   fluticasone (FLONASE) 50 MCG/ACT nasal spray as needed.   fluticasone-salmeterol (ADVAIR) 250-50 MCG/ACT AEPB Inhale 2 puffs into the lungs in the morning and at bedtime.   Fluticasone-Umeclidin-Vilant (TRELEGY ELLIPTA) 100-62.5-25 MCG/INH AEPB Inhale into the lungs.   lidocaine (XYLOCAINE) 5 % ointment Apply 1 application topically daily as needed.   losartan (COZAAR) 100 MG tablet Take 100 mg by mouth daily.   metFORMIN (GLUCOPHAGE-XR) 500 MG 24 hr tablet Take 500 mg by mouth daily.   nitroGLYCERIN (NITROSTAT) 0.4 MG SL tablet Place 1 tablet (0.4 mg total) under the tongue  every 5 (five) minutes as needed for chest pain.   omeprazole (PRILOSEC) 40 MG capsule TAKE 1 CAPSULE BY MOUTH EVERY DAY BEFORE BREAKFAST   ondansetron (ZOFRAN) 4 MG tablet Take 1 tablet (4 mg total) by mouth every 8 (eight) hours as needed for nausea or vomiting.   oxybutynin (DITROPAN-XL) 5 MG 24 hr tablet TK 1T PO QD FOR INCONTINENCE   traMADol (ULTRAM) 50 MG tablet Take 50 mg by mouth every 6 (six) hours as needed.    Allergies: Patient has no known allergies.  Social History   Tobacco Use   Smoking status: Some Days    Packs/day: 1.00    Years: 42.00    Pack years: 42.00    Types: Cigarettes, Cigars    Last attempt to quit: 11/01/2017    Years since quitting: 4.0   Smokeless tobacco: Never   Tobacco comments:    3 cigars daily--12/13/2020  Vaping Use   Vaping  Use: Never used  Substance Use Topics   Alcohol use: Yes    Comment: occassionally   Drug use: No    Family History  Problem Relation Age of Onset   Hypertension Mother    Heart Problems Mother    Breast cancer Neg Hx     Review of Systems: A 12-system review of systems was performed and was negative except as noted in the HPI.  --------------------------------------------------------------------------------------------------  Physical Exam: BP 92/60 (BP Location: Left Arm, Patient Position: Sitting, Cuff Size: Large)    Pulse (!) 59    Ht 5\' 6"  (1.676 m)    Wt 196 lb (88.9 kg)    SpO2 99%    BMI 31.64 kg/m  Repeat: 122/80  General:  NAD. Neck: No JVD or HJR. Lungs: Clear to auscultation bilaterally without wheezes or crackles. Heart: Regular rate and rhythm without murmurs, rubs, or gallops. Abdomen: Soft, nontender, nondistended. Extremities: No lower extremity edema.  EKG:  Normal sinus rhythm with septal infarct and anterolateral ST depressions and T wave inversions.  Heart rate has decreased since 02/08/2021.  Otherwise, no significant interval change.  Lab Results  Component Value Date   WBC 10.4  05/01/2020   HGB 13.4 05/01/2020   HCT 40.8 05/01/2020   MCV 94.7 05/01/2020   PLT 319 05/01/2020    Lab Results  Component Value Date   NA 134 07/24/2020   K 4.7 07/24/2020   CL 93 (L) 07/24/2020   CO2 25 07/24/2020   BUN 17 07/24/2020   CREATININE 1.21 (H) 07/24/2020   GLUCOSE 341 (H) 07/24/2020   ALT 9 03/20/2021    Lab Results  Component Value Date   CHOL 119 03/20/2021   HDL 34 (L) 03/20/2021   LDLCALC 63 03/20/2021   LDLDIRECT 62 03/20/2021   TRIG 124 03/20/2021   CHOLHDL 3.5 03/20/2021    --------------------------------------------------------------------------------------------------  ASSESSMENT AND PLAN: Coronary artery disease with stable angina: Caitlyn Herrera continues to have heaviness in her chest when she first lies down at night.  This has been present ever since her bypass surgery in 2019.  I suspect this is musculoskeletal in nature given absence of an exertional component as well as reassuring myocardial perfusion stress test last year without any ischemia.  Longstanding anterolateral ST/T changes are unchanged on today's EKG.  We will continue current antianginal regimen consisting of carvedilol and isosorbide mononitrate, as well as secondary prevention with aspirin, atorvastatin, and ezetimibe.  I encouraged Caitlyn Herrera to try to increase her activity.  Ischemic cardiomyopathy: Caitlyn Herrera appears euvolemic with stable NYHA class II symptoms.  Continue current doses of carvedilol and losartan.  No indication for standing diuretic.  Hyperlipidemia associated with type 2 diabetes mellitus: Lipids well controlled on last check in 02/2021.  Continue atorvastatin and ezetimibe for target LDL less than 70.  We will reach out to the [facility] to see if she has had a more recent lipid panel.  Ongoing DM management per the [facility].  Hypertension: Initial blood pressure in the office today was borderline low.  Blood  pressure was more normal at 122/80 on recheck.  We will defer medication changes today.  Follow-up: Return to clinic in 6 months.  [facility], MD 11/07/2021 3:39 PM

## 2021-11-08 ENCOUNTER — Encounter: Payer: Self-pay | Admitting: Internal Medicine

## 2021-11-08 DIAGNOSIS — I255 Ischemic cardiomyopathy: Secondary | ICD-10-CM | POA: Insufficient documentation

## 2021-11-15 NOTE — Telephone Encounter (Signed)
Attempted to schedule.  

## 2021-11-15 NOTE — Telephone Encounter (Signed)
Attempted to schedule no ans no vm  

## 2022-02-05 ENCOUNTER — Other Ambulatory Visit: Payer: Self-pay

## 2022-02-05 DIAGNOSIS — E785 Hyperlipidemia, unspecified: Secondary | ICD-10-CM

## 2022-02-05 MED ORDER — ATORVASTATIN CALCIUM 80 MG PO TABS: ORAL_TABLET | ORAL | 0 refills | Status: DC

## 2022-02-05 MED ORDER — EZETIMIBE 10 MG PO TABS: 10.0000 mg | ORAL_TABLET | Freq: Every day | ORAL | 0 refills | Status: DC

## 2022-02-05 NOTE — Telephone Encounter (Signed)
Scheduled  ? ? ?patient also needs zetia  ? ? ?

## 2022-02-06 ENCOUNTER — Telehealth: Payer: Self-pay | Admitting: *Deleted

## 2022-02-06 NOTE — Telephone Encounter (Signed)
LMTC to schedule Yearly Lung CA CT Scan. 

## 2022-04-05 ENCOUNTER — Other Ambulatory Visit: Payer: Self-pay | Admitting: Physician Assistant

## 2022-04-08 NOTE — Telephone Encounter (Signed)
Rx request sent to pharmacy.  

## 2022-04-29 ENCOUNTER — Encounter: Payer: Self-pay | Admitting: *Deleted

## 2022-05-03 ENCOUNTER — Other Ambulatory Visit: Payer: Self-pay | Admitting: Internal Medicine

## 2022-05-03 DIAGNOSIS — E785 Hyperlipidemia, unspecified: Secondary | ICD-10-CM

## 2022-05-08 ENCOUNTER — Encounter: Payer: Self-pay | Admitting: Internal Medicine

## 2022-05-08 ENCOUNTER — Ambulatory Visit (INDEPENDENT_AMBULATORY_CARE_PROVIDER_SITE_OTHER): Payer: Medicare Other | Admitting: Internal Medicine

## 2022-05-08 ENCOUNTER — Other Ambulatory Visit
Admission: RE | Admit: 2022-05-08 | Discharge: 2022-05-08 | Disposition: A | Discharge status: Home / Self Care | Payer: Medicare Other | Source: Ambulatory Visit | Attending: Internal Medicine | Admitting: Internal Medicine

## 2022-05-08 VITALS — BP 122/70 | HR 68 | Ht 66.0 in | Wt 191.0 lb

## 2022-05-08 DIAGNOSIS — I10 Essential (primary) hypertension: Secondary | ICD-10-CM | POA: Insufficient documentation

## 2022-05-08 DIAGNOSIS — I255 Ischemic cardiomyopathy: Secondary | ICD-10-CM | POA: Diagnosis present

## 2022-05-08 DIAGNOSIS — E1169 Type 2 diabetes mellitus with other specified complication: Secondary | ICD-10-CM

## 2022-05-08 DIAGNOSIS — R0602 Shortness of breath: Secondary | ICD-10-CM | POA: Insufficient documentation

## 2022-05-08 DIAGNOSIS — R5383 Other fatigue: Secondary | ICD-10-CM | POA: Diagnosis not present

## 2022-05-08 DIAGNOSIS — I25118 Atherosclerotic heart disease of native coronary artery with other forms of angina pectoris: Secondary | ICD-10-CM | POA: Diagnosis present

## 2022-05-08 DIAGNOSIS — E785 Hyperlipidemia, unspecified: Secondary | ICD-10-CM | POA: Insufficient documentation

## 2022-05-08 LAB — COMPREHENSIVE METABOLIC PANEL
ALT: 11 U/L (ref 0–44)
AST: 15 U/L (ref 15–41)
Albumin: 4.1 g/dL (ref 3.5–5.0)
Alkaline Phosphatase: 78 U/L (ref 38–126)
Anion gap: 5 (ref 5–15)
BUN: 11 mg/dL (ref 8–23)
CO2: 26 mmol/L (ref 22–32)
Calcium: 9.5 mg/dL (ref 8.9–10.3)
Chloride: 107 mmol/L (ref 98–111)
Creatinine, Ser: 1.09 mg/dL — ABNORMAL HIGH (ref 0.44–1.00)
GFR, Estimated: 57 mL/min — ABNORMAL LOW (ref >60)
Glucose, Bld: 100 mg/dL — ABNORMAL HIGH (ref 70–99)
Potassium: 3.8 mmol/L (ref 3.5–5.1)
Sodium: 138 mmol/L (ref 135–145)
Total Bilirubin: 0.4 mg/dL (ref 0.3–1.2)
Total Protein: 7.6 g/dL (ref 6.5–8.1)

## 2022-05-08 LAB — CBC
HCT: 35.1 % — ABNORMAL LOW (ref 36.0–46.0)
Hemoglobin: 11.5 g/dL — ABNORMAL LOW (ref 12.0–15.0)
MCH: 31.3 pg (ref 26.0–34.0)
MCHC: 32.8 g/dL (ref 30.0–36.0)
MCV: 95.4 fL (ref 80.0–100.0)
Platelets: 321 10*3/uL (ref 150–400)
RBC: 3.68 MIL/uL — ABNORMAL LOW (ref 3.87–5.11)
RDW: 13.6 % (ref 11.5–15.5)
WBC: 10.6 10*3/uL — ABNORMAL HIGH (ref 4.0–10.5)
nRBC: 0 % (ref 0.0–0.2)

## 2022-05-08 LAB — LIPID PANEL
Cholesterol: 114 mg/dL (ref 0–200)
HDL: 33 mg/dL — ABNORMAL LOW (ref >40)
LDL Cholesterol: 56 mg/dL (ref 0–99)
Total CHOL/HDL Ratio: 3.5 RATIO
Triglycerides: 123 mg/dL (ref <150)
VLDL: 25 mg/dL (ref 0–40)

## 2022-05-08 LAB — TSH: TSH: 3.436 u[IU]/mL (ref 0.350–4.500)

## 2022-05-08 NOTE — Progress Notes (Signed)
Follow-up Outpatient Visit Date: 05/08/2022  Primary Care Provider: [name] [address] [place] [address]  Chief Complaint: Follow-up coronary artery disease  HPI:  Caitlyn Herrera is a 61 y.o. 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  I last saw her in January, which time she was feeling fairly well.  She reported some heaviness in her chest when lying down at night but no symptoms when active.  This positional discomfort has been present ever since her CABG in 2019 and has not worsened.  We did not make any medication changes or pursue additional testing.  Today, [name] reports that she feels about the same as at prior visits.  She still notes some heaviness in her chest when she lies down at night or bends over.  She also has occasional sharp chest pain that comes on randomly, often when moving in certain ways.  Her symptoms are not typically exertional.  She has some fatigue and exertional dyspnea that is chronic.  She notes that it has been over a year since she last saw Dr. Jayme Cloud in the pulmonary clinic.  She notes some intermittent congestion in the chest.  She denies palpitations.  Her blood pressures have remained labile at home, with some readings up to 190/80.  However, at other times she reports symptomatical low blood pressures, though she does not provide any specific readings.  She remains compliant with her medications.  --------------------------------------------------------------------------------------------------  Past Medical History:  Diagnosis Date   Abdominal aortic ectasia (HCC)    a. 11/2017 CTA chest/abd/pelvis: 2.5 cm abd ao ectasia -->rec f/u u/s in 5 yrs.   CAD (coronary artery disease)    a. 11/04/17 Cath: Native multivessel dzs-->CABG  x 3 (LIMA->LAD, VG->OM2, VG->RPDA; b. 11/2017 MV: mid antlat/apical isch; c. 11/2017 Cath: LM 30d, LAD 64m, LCX 80p/m, OM1 90, OM2 80 (@ anastamosis of graft), RCA 80p, 11m, 90d, LIMA->LAD nl, VG->OM2 nl, VG->RPDA nl-->Med Rx.   Carotid arterial disease (HCC)    a. 10/2017 Carotid U/S: 1-30% bilat ICA stenosis.   History of echocardiogram    a. 11/04/2017 Echo: EF of 65-70%, no RWMA, nl LV diastolic fxn, nl RV size/fxn, mild TR; b. 03/2018 Echo: EF 60-65%, mild LVH, no rwma, mild MR. Mildy reduced RV fxn. Mild TR.   History of Tobacco abuse    Hyperlipidemia    Hypertension    Left renal artery stenosis (HCC)    a. 11/2017 CTA Chest/Abd/Pelvis: 50-70% L RA stenosis.   Palpitations    a. 02/2018 Event Monitor: wore for 2 days, 21 hrs - rare PAC's/PVC's. Avg HR 66 (42-103); b. 04/2018 Event Monitor: Wore for 14 days. RSR, 67 (49-111). No significant arrhythmias.   Persistent cough    Pulmonary nodule, right    a. 10/2017 CT Chest: 16mm pulm nodule in R lung apex - rec f/u w/ non-contrast chest CT in 1 year.   Type II diabetes mellitus (HCC)    a. 10/2017 A1c 6.6.   Past Surgical History:  Procedure Laterality Date   BREAST BIOPSY Right 06/23/2018   US guided biopsy - awaiting pathology   BREAST CYST EXCISION Right    CARDIAC CATHETERIZATION     CORONARY ARTERY BYPASS GRAFT N/A 11/12/2017   Procedure: CORONARY ARTERY BYPASS GRAFTING (CABG) x Three , using left internal mammary  artery and right leg greater saphenous vein harvested endoscopically;  Surgeon: Kerin Perna, MD;  Location: [facility] OR;  Service: Open Heart Surgery;  Laterality: N/A;   DILATION AND CURETTAGE OF UTERUS     LAPAROSCOPIC CHOLECYSTECTOMY     LEFT HEART CATH AND CORONARY ANGIOGRAPHY N/A 11/04/2017   Procedure: LEFT HEART CATH AND CORONARY ANGIOGRAPHY;  Surgeon: Yvonne Kendall, MD;  Location: [facility] INVASIVE CV LAB;  Service: Cardiovascular;  Laterality: N/A;   LEFT HEART CATH AND CORS/GRAFTS ANGIOGRAPHY N/A 12/09/2017   Procedure:  LEFT HEART CATH AND CORS/GRAFTS ANGIOGRAPHY;  Surgeon: [name], Peter M, MD;  Location: [facility] INVASIVE CV LAB;  Service: Cardiovascular;  Laterality: N/A;   TEE WITHOUT CARDIOVERSION N/A 11/12/2017   Procedure: TRANSESOPHAGEAL ECHOCARDIOGRAM (TEE);  Surgeon: Donata Clay, Theron Arista, MD;  Location: [facility] OR;  Service: Open Heart Surgery;  Laterality: N/A;    Current Meds  Medication Sig   albuterol (PROVENTIL HFA;VENTOLIN HFA) 108 (90 Base) MCG/ACT inhaler Inhale 2 puffs into the lungs every 6 (six) hours as needed for wheezing or shortness of breath.   aspirin EC 81 MG tablet Take 1 tablet (81 mg total) by mouth daily.   atorvastatin (LIPITOR) 80 MG tablet TAKE 1 TABLET(80 MG) BY MOUTH DAILY AT 6 PM   azelastine (ASTELIN) 0.1 % nasal spray Place 2 sprays into both nostrils as needed.   benzonatate (TESSALON) 100 MG capsule Take 100 mg by mouth as needed.    carvedilol (COREG) 6.25 MG tablet Take 1 tablet (6.25 mg total) by mouth 2 (two) times daily with a meal.   cetirizine (ZYRTEC) 10 MG tablet Take 10 mg by mouth daily.   chlorpheniramine-HYDROcodone (TUSSIONEX PENNKINETIC ER) 10-8 MG/5ML SUER Take 5 mLs by mouth 2 (two) times daily.   citalopram (CELEXA) 20 MG tablet Take 20 mg by mouth daily.   cyclobenzaprine (FLEXERIL) 10 MG tablet as needed.   dextromethorphan-guaiFENesin (ROBITUSSIN-DM) 10-100 MG/5ML liquid as needed.   ezetimibe (ZETIA) 10 MG tablet Take 1 tablet (10 mg total) by mouth daily.   fluticasone (FLONASE) 50 MCG/ACT nasal spray as needed.   fluticasone-salmeterol (ADVAIR) 250-50 MCG/ACT AEPB Inhale 2 puffs into the lungs in the morning and at bedtime.   isosorbide mononitrate (IMDUR) 60 MG 24 hr tablet TAKE 1 TABLET(60 MG) BY MOUTH DAILY   lidocaine (XYLOCAINE) 5 % ointment Apply 1 application topically daily as needed.   losartan (COZAAR) 100 MG tablet Take 100 mg by mouth daily.   nitroGLYCERIN (NITROSTAT) 0.4 MG SL tablet Place 1 tablet (0.4 mg total) under the tongue every 5 (five)  minutes as needed for chest pain.   omeprazole (PRILOSEC) 40 MG capsule TAKE 1 CAPSULE BY MOUTH EVERY DAY BEFORE BREAKFAST   ondansetron (ZOFRAN) 4 MG tablet Take 1 tablet (4 mg total) by mouth every 8 (eight) hours as needed for nausea or vomiting.   SitaGLIPtin-MetFORMIN HCl (JANUMET XR) 50-1000 MG TB24 Take 1 tablet by mouth daily.    Allergies: Patient has no known allergies.  Social History   Tobacco Use   Smoking status: Some Days    Packs/day: 1.00    Years: 42.00    Total pack years: 42.00    Types: Cigarettes, Cigars    Last attempt to quit: 11/01/2017    Years since quitting: 4.5   Smokeless tobacco: Never   Tobacco comments:    3 cigars daily--12/13/2020  Vaping Use   Vaping Use: Never used  Substance Use Topics   Alcohol use: Yes  Comment: occassionally   Drug use: No    Family History  Problem Relation Age of Onset   Hypertension Mother    Heart Problems Mother    Breast cancer Neg Hx     Review of Systems: A 12-system review of systems was performed and was negative except as noted in the HPI.  --------------------------------------------------------------------------------------------------  Physical Exam: BP 122/70 (BP Location: Left Arm, Patient Position: Sitting, Cuff Size: Large)   Pulse 68   Ht 5\' 6"  (1.676 m)   Wt 191 lb (86.6 kg)   SpO2 98%   BMI 30.83 kg/m   General:  NAD. Neck: No JVD or HJR. Lungs: Mildly diminished breath sounds throughout with scattered rhonchi anteriorly.  No wheezes or crackles. Heart: Regular rate and rhythm without murmurs, rubs, or gallops. Abdomen: Soft, nontender, nondistended. Extremities: No lower extremity edema.  EKG:  Normal sinus rhythm with left atrial enlargement, septal infarct, and lateral ST/T changes.  No significant change since 11/07/2021.  Lab Results  Component Value Date   WBC 10.4 05/01/2020   HGB 13.4 05/01/2020   HCT 40.8 05/01/2020   MCV 94.7 05/01/2020   PLT 319 05/01/2020     Lab Results  Component Value Date   NA 134 07/24/2020   K 4.7 07/24/2020   CL 93 (L) 07/24/2020   CO2 25 07/24/2020   BUN 17 07/24/2020   CREATININE 1.21 (H) 07/24/2020   GLUCOSE 341 (H) 07/24/2020   ALT 9 03/20/2021    Lab Results  Component Value Date   CHOL 119 03/20/2021   HDL 34 (L) 03/20/2021   LDLCALC 63 03/20/2021   LDLDIRECT 62 03/20/2021   TRIG 124 03/20/2021   CHOLHDL 3.5 03/20/2021    --------------------------------------------------------------------------------------------------  ASSESSMENT AND PLAN: Coronary artery disease with stable angina: Ms. Luddy continues to have atypical chest pain that is predominantly positional and has been present ever since her CABG in 2019.  Myocardial perfusion stress test last year was low risk without ischemia or scar.  I doubt that the pain is related to worsening coronary insufficiency, though she does have persistent lateral ST/T changes seen again on today's EKG.  We have agreed to begin with an echocardiogram.  If there is evidence of any wall motion abnormality or cardiomyopathy, will need to consider repeat catheterization.  We will continue her current antianginal regimen of carvedilol and isosorbide mononitrate.  Continue secondary prevention as well with aspirin, atorvastatin, and ezetimibe.  Ischemic cardiomyopathy, shortness of breath, and fatigue: These have been longstanding symptoms and could be due to a number of.  We have agreed to repeat an echocardiogram to ensure that there is no new cardiomyopathy/structural abnormality.  I have also encouraged Ms. 2020 to follow-up with Dr. Rubye Oaks in the pulmonary clinic, particularly given that she has some rhonchi on exam today and has been dealing with intermittent chest congestion.  I will check a CBC and CMP as well as TSH today for further evaluation of other potential causes of her dyspnea/fatigue.  Hypertension: Ms. Booz reports labile blood pressures  at home, though reading in the office today is normal.  We discussed medication changes but have agreed to defer them for now.  Hyperlipidemia associated with type 2 diabetes mellitus: Lipids reasonable on last check a year ago, though HDL was a bit low.  We will recheck a CMP and lipid panel today with plans to continue atorvastatin and ezetimibe for target LDL less than 70, ideally below 55.  Follow-up: Return to clinic in  3 months.  Yvonne Kendall, MD 05/08/2022 4:47 PM

## 2022-05-08 NOTE — Patient Instructions (Addendum)
Medication Instructions:   Your physician recommends that you continue on your current medications as directed. Please refer to the Current Medication list given to you today.  *If you need a refill on your cardiac medications before your next appointment, please call your pharmacy*   Lab Work:  Today at the medical mall at [facility]: CBC, CMET, TSH, Lipid panel  -  Please go to the Medical Mall Entrance at [facility] -  Check in at the Registration Desk: 1st desk to the right, past the screening table  If you have labs (blood work) drawn today and your tests are completely normal, you will receive your results only by: MyChart Message (if you have MyChart) OR A paper copy in the mail If you have any lab test that is abnormal or we need to change your treatment, we will call you to review the results.   Testing/Procedures:  Your physician has requested that you have an echocardiogram. Echocardiography is a painless test that uses sound waves to create images of your heart. It provides your doctor with information about the size and shape of your heart and how well your heart's chambers and valves are working. This procedure takes approximately one hour. There are no restrictions for this procedure.   Follow-Up: At [facility], you and your health needs are our priority.  As part of our continuing mission to provide you with exceptional heart care, we have created designated Provider Care Teams.  These Care Teams include your primary Cardiologist (physician) and Advanced Practice Providers (APPs -  Physician Assistants and Nurse Practitioners) who all work together to provide you with the care you need, when you need it.  We recommend signing up for the patient portal called "MyChart".  Sign up information is provided on this After Visit Summary.  MyChart is used to connect with patients for Virtual Visits (Telemedicine).  Patients are able to view lab/test results, encounter notes, upcoming  appointments, etc.  Non-urgent messages can be sent to your provider as well.   To learn more about what you can do with MyChart, go to [email or web address].    Your next appointment:   3 month(s)  The format for your next appointment:   In Person  Provider:   You may see Yvonne Kendall, MD or one of the following Advanced Practice Providers on your designated Care Team:   Nicolasa Ducking, NP Eula Listen, PA-C Cadence Fransico Michael, [place]    Other Instructions  Please call Dr. Jayme Cloud to schedule follow up appointment  Important Information About Sugar

## 2022-05-09 ENCOUNTER — Telehealth: Payer: Self-pay | Admitting: Internal Medicine

## 2022-05-09 ENCOUNTER — Telehealth: Payer: Self-pay | Admitting: *Deleted

## 2022-05-09 NOTE — Telephone Encounter (Signed)
-----   Message from Yvonne Kendall, MD sent at 05/08/2022  9:15 PM EDT ----- Please let Caitlyn Herrera know that her lipids are adequately controlled.  Kidney function, liver function, and electrolytes are stable.  She is mildly anemic, which has been seen in the past.  Thyroid function is normal.  I recommend that she continue her current medications and discuss her anemia and need for further testing with her PCP at their next visit.  We will follow-up after completion of echocardiogram.

## 2022-05-09 NOTE — Telephone Encounter (Signed)
Attempted to call pt. No answer. Unable to leave voicemail, vm full.

## 2022-05-09 NOTE — Telephone Encounter (Signed)
Attempted to schedule 3 m fu per checkout 05-08-22 no ans no vm

## 2022-05-13 ENCOUNTER — Other Ambulatory Visit: Payer: Self-pay | Admitting: Internal Medicine

## 2022-05-21 NOTE — Telephone Encounter (Signed)
Attempted to call pt. No answer. Lmtcb.  

## 2022-06-10 ENCOUNTER — Ambulatory Visit (INDEPENDENT_AMBULATORY_CARE_PROVIDER_SITE_OTHER): Payer: Medicare Other

## 2022-06-10 DIAGNOSIS — R0602 Shortness of breath: Secondary | ICD-10-CM

## 2022-06-10 LAB — ECHOCARDIOGRAM COMPLETE
AR max vel: 2.38 cm2
AV Area VTI: 2.4 cm2
AV Area mean vel: 2.38 cm2
AV Mean grad: 3 mmHg
AV Peak grad: 6.4 mmHg
Ao pk vel: 1.26 m/s
Area-P 1/2: 4.17 cm2
Calc EF: 56.9 %
S' Lateral: 3.2 cm
Single Plane A2C EF: 57.1 %
Single Plane A4C EF: 57.5 %

## 2022-06-11 ENCOUNTER — Telehealth: Payer: Self-pay | Admitting: *Deleted

## 2022-06-11 NOTE — Telephone Encounter (Signed)
See result note.  °The patient has been notified of the result and verbalized understanding.  All questions (if any) were answered. ° ° °

## 2022-06-11 NOTE — Telephone Encounter (Signed)
-----   Message from Yvonne Kendall, MD sent at 06/11/2022  7:15 AM EDT ----- Please let Ms. Criado know that her echocardiogram is normal.  If she continues to have significant shortness of breath, I recommend that she reach out to Dr. Jayme Cloud for further evaluation.  She should follow-up with Eula Listen, PA, as scheduled in October unless new questions or concerns develop in the meantime.

## 2022-06-11 NOTE — Telephone Encounter (Signed)
Attempted to call pt. No answer. Lmtcb.  

## 2022-06-12 NOTE — Telephone Encounter (Signed)
Attempted to call pt. No answer. Lmtcb x 2.  

## 2022-06-14 NOTE — Telephone Encounter (Signed)
The patient has been notified of the result and verbalized understanding.  All questions (if any) were answered. Pt states she has already scheduled an appointment with Dr. Jayme Cloud, scheduled 08/22/22.

## 2022-07-06 ENCOUNTER — Other Ambulatory Visit: Payer: Self-pay | Admitting: Internal Medicine

## 2022-08-06 NOTE — Progress Notes (Deleted)
Cardiology Office Note    Date:  08/06/2022   ID:  Suhayla, Chisom [birth year], MRN [id number]  PCP:  [name]  Cardiologist:  Yvonne Kendall, MD  Electrophysiologist:  None   Chief Complaint: ***  History of Present Illness:   Caitlyn Herrera is a 61 y.o. female with history of CAD s/p 3-vessel CABG in 10/2017, mild pulmonary hypertension, COPD secondary to tobacco use, DM2, HTN, HLD, intermittent medical adherence, and GERD who presents for follow up of ***.    She was admitted to the hospital in 10/2017 with an NSTEMI and underwent diagnostic LHC which revealed severe multivessel CAD.  Echo showed normal LV systolic function.  She subsequently underwent three-vessel CABG with LIMA to LAD, SVG to OM2, and SVG to RPDA.  She required repeat LHC in 11/2017 in the setting of subsequent admission with mild troponin elevation and abnormal stress testing which showed 3 of 3 patent grafts with native multivessel disease including 80% stenosis at the anastomosis of the SVG to OM2.  This area was not felt to be amenable to PCI and she was medically managed.  Since her bypass, she has been evaluated multiple times for complaints of palpitations with cardiac monitoring being unrevealing in 02/2018 and 04/2018.  On both occasions, duration of monitor compliance was limited.  She has also noted an intermittent productive cough that has been present since her CABG.  She was placed on Protonix by ENT though did not tolerate this.  She was seen virtually in 08/2018 and referred to pulmonology with symptoms felt to be mostly reflux in etiology.  She has been evaluated by GI with recommendation to proceed with EGD and colonoscopy though these have not been completed.  She has been evaluated by the lipid clinic with recommendation to improve dietary lifestyle and repeat lipid testing with consideration for PCSK9 inhibitor if LDL remained above goal, though was lost to follow up.   She was seen in the office in 02/2020 continuing to note shortness of breath and occasional palpitations.  Symptoms were largely unchanged when compared to symptoms noted since her CABG.  Imdur 15 mg daily was added.  Her cath from 11/2017 was reviewed by interventional cardiology and it was noted large grafts were anastomosed to very small diseased native vessels.  Her shortness of breath was felt to be multifactorial including underlying CAD, COPD, and GERD.  She was seen in the ED on 05/01/2020 for fever of 102.3, cough, and chest discomfort.  Blood cultures negative x2.  COVID-19 negative.  High-sensitivity troponin 11 with a delta of 8.  Chest x-ray with no active cardiopulmonary disease.  EKG showed sinus tachycardia, 102 bpm, diffuse ST-T changes that were largely unchanged when compared to prior.  She was diagnosed with bronchitis, treated with prednisone and Tussionex.   Due to her longstanding dyspnea, she underwent echo in 05/2020 which demonstrated an EF of 60 to 65%, no regional wall motion abnormalities, mild LVH, normal LV diastolic function parameters, normal RV systolic function and ventricular cavity size, mildly elevated PASP at 37.3 mmHg, and mild mitral regurgitation.   She was seen in the office in 01/2021 noting more chest tightness and shortness of breath over the preceding several months.  She underwent Lexiscan MPI on 02/20/2021 which showed no significant ischemia or scar with an EF of 57% and was overall a low risk study.  Following the reinitiation of carvedilol, she noted improvement in her chest discomfort.   She  was last seen in the office in 04/2022 and continued to note heaviness in her chest when she laid down or bend over.  The symptoms had been present since her CABG in 2019.  She also continues to have occasional sharp chest discomfort that occurred randomly or when moving in certain ways.  She continued to have labile blood pressures at home.  She was advised to schedule an  appointment with pulmonology for further evaluation of her dyspnea.  Subsequent echo in 05/2022 demonstrated an EF of 55 to 60%, no regional wall motion abnormalities, normal LV diastolic function parameters, normal RV systolic function and ventricular cavity size, trivial mitral regurgitation, and an estimated right atrial pressure of 3 mmHg.  ***   Labs independently reviewed: 04/2022 - Hgb 11.5, PLT 321, TSH normal, TC 114, TG 123, HDL 33, LDL 56, potassium 3.8, BUN 11, serum creatinine 1.09, albumin 4.1, AST/ALT normal  Past Medical History:  Diagnosis Date   Abdominal aortic ectasia (HCC)    a. 11/2017 CTA chest/abd/pelvis: 2.5 cm abd ao ectasia -->rec f/u u/s in 5 yrs.   CAD (coronary artery disease)    a. 11/04/17 Cath: Native multivessel dzs-->CABG x 3 (LIMA->LAD, VG->OM2, VG->RPDA; b. 11/2017 MV: mid antlat/apical isch; c. 11/2017 Cath: LM 30d, LAD 53m, LCX 80p/m, OM1 90, OM2 80 (@ anastamosis of graft), RCA 80p, 11m, 90d, LIMA->LAD nl, VG->OM2 nl, VG->RPDA nl-->Med Rx.   Carotid arterial disease (HCC)    a. 10/2017 Carotid U/S: 1-30% bilat ICA stenosis.   History of echocardiogram    a. 11/04/2017 Echo: EF of 65-70%, no RWMA, nl LV diastolic fxn, nl RV size/fxn, mild TR; b. 03/2018 Echo: EF 60-65%, mild LVH, no rwma, mild MR. Mildy reduced RV fxn. Mild TR.   History of Tobacco abuse    Hyperlipidemia    Hypertension    Left renal artery stenosis (HCC)    a. 11/2017 CTA Chest/Abd/Pelvis: 50-70% L RA stenosis.   Palpitations    a. 02/2018 Event Monitor: wore for 2 days, 21 hrs - rare PAC's/PVC's. Avg HR 66 (42-103); b. 04/2018 Event Monitor: Wore for 14 days. RSR, 67 (49-111). No significant arrhythmias.   Persistent cough    Pulmonary nodule, right    a. 10/2017 CT Chest: 33mm pulm nodule in R lung apex - rec f/u w/ non-contrast chest CT in 1 year.   Type II diabetes mellitus (HCC)    a. 10/2017 A1c 6.6.    Past Surgical History:  Procedure Laterality Date   BREAST BIOPSY Right 06/23/2018    US guided biopsy - awaiting pathology   BREAST CYST EXCISION Right    CARDIAC CATHETERIZATION     CORONARY ARTERY BYPASS GRAFT N/A 11/12/2017   Procedure: CORONARY ARTERY BYPASS GRAFTING (CABG) x Three , using left internal mammary artery and right leg greater saphenous vein harvested endoscopically;  Surgeon: Kerin Perna, MD;  Location: [facility];  Service: Open Heart Surgery;  Laterality: N/A;   DILATION AND CURETTAGE OF UTERUS     LAPAROSCOPIC CHOLECYSTECTOMY     LEFT HEART CATH AND CORONARY ANGIOGRAPHY N/A 11/04/2017   Procedure: LEFT HEART CATH AND CORONARY ANGIOGRAPHY;  Surgeon: Yvonne Kendall, MD;  Location: [facility] INVASIVE CV LAB;  Service: Cardiovascular;  Laterality: N/A;   LEFT HEART CATH AND CORS/GRAFTS ANGIOGRAPHY N/A 12/09/2017   Procedure: LEFT HEART CATH AND CORS/GRAFTS ANGIOGRAPHY;  Surgeon: [name], Peter M, MD;  Location[facility] CV LAB;  Service: Cardiovascular;  Laterality: N/A;   TEE WITHOUT CARDIOVERSION N/A  11/12/2017   Procedure: TRANSESOPHAGEAL ECHOCARDIOGRAM (TEE);  Surgeon: Prescott Gum, Collier Salina, MD;  Location: [facility];  Service: Open Heart Surgery;  Laterality: N/A;    Current Medications: No outpatient medications have been marked as taking for the 08/09/22 encounter (Appointment) with Rise Mu, PA-C.    Allergies:   Patient has no known allergies.   Social History   Socioeconomic History   Marital status: Widowed    Spouse name: Not on file   Number of children: Not on file   Years of education: Not on file   Highest education level: Not on file  Occupational History    Employer: [employer]  Tobacco Use   Smoking status: Some Days    Packs/day: 1.00    Years: 42.00    Total pack years: 42.00    Types: Cigarettes, Cigars    Last attempt to quit: 11/01/2017    Years since quitting: 4.7   Smokeless tobacco: Never   Tobacco comments:    3 cigars daily--12/13/2020  Vaping Use   Vaping Use: Never used  Substance and Sexual Activity   Alcohol  use: Yes    Comment: occassionally   Drug use: No   Sexual activity: Never  Other Topics Concern   Not on file  Social History Narrative   Not on file   Social Determinants of Health   Financial Resource Strain: Not on file  Food Insecurity: Not on file  Transportation Needs: Not on file  Physical Activity: Not on file  Stress: Not on file  Social Connections: Not on file     Family History:  The patient's family history includes Heart Problems in her mother; Hypertension in her mother. There is no history of Breast cancer.  ROS:   ROS   EKGs/Labs/Other Studies Reviewed:    Studies reviewed were summarized above. The additional studies were reviewed today:  2D echo 06/10/2022: 1. Left ventricular ejection fraction, by estimation, is 55 to 60%. Left  ventricular ejection fraction by 2D MOD biplane is 56.9 %. The left  ventricle has normal function. The left ventricle has no regional wall  motion abnormalities. Left ventricular  diastolic parameters were normal. The average left ventricular global  longitudinal strain is -19.0 %. The global longitudinal strain is normal.   2. Right ventricular systolic function is normal. The right ventricular  size is normal.   3. The mitral valve is normal in structure. Trivial mitral valve  regurgitation.   4. The aortic valve is tricuspid. Aortic valve regurgitation is not  visualized.   5. The inferior vena cava is normal in size with greater than 50%  respiratory variability, suggesting right atrial pressure of 3 mmHg. __________  Carlton Adam MPI 01/2021: Normal pharmacologic myocardial perfusion stress test without evidence of significant ischemia or scar. The left ventricular ejection fraction is normal (57%) with normal wall motion. This is a low risk study. Attenuation correction CT demonstrates post CABG findings __________   2D echo 05/2020: 1. Left ventricular ejection fraction, by estimation, is 60 to 65%. The  left  ventricle has normal function. The left ventricle has no regional  wall motion abnormalities. There is mild left ventricular hypertrophy.  Left ventricular diastolic parameters  were normal.   2. Right ventricular systolic function is normal. The right ventricular  size is normal. There is mildly elevated pulmonary artery systolic  pressure. The estimated right ventricular systolic pressure is 54.6 mmHg.   3. Mild mitral valve regurgitation. __________   Elwyn Reach  patch 04/2018: The patient was enrolled for 14 days. 20% of the monitoring period yielded diagnostic tracings. The predominant rhythm was sinus with an average rate of 67 bpm (range 49-111 bpm). No significant arrhythmia was observed.   Sinus rhythm without significant arrhythmias.  Study significant limited by short monitoring period (only 20% of monitoring period yielded diagnostic tracings). __________   2D echo 03/2018: - Left ventricle: The cavity size was at the upper limits of    normal. Wall thickness was increased in a pattern of mild LVH.    Systolic function was normal. The estimated ejection fraction was    in the range of 60% to 65%. Wall motion was normal; there were no    regional wall motion abnormalities. Left ventricular diastolic    function parameters were normal.  - Mitral valve: There was mild regurgitation.  - Right ventricle: The cavity size was normal. Systolic function    was mildly reduced.  - Tricuspid valve: There was mild regurgitation. __________   Luci Bank patch 02/2018: The patient was monitored for 2 days, 21 hours. The predominant rhythm was sinus with an average rate of 66 bpm (range 42-103). Rare PAC's and PVC's were noted. There were no sustained arrhythmias or prolonged pauses.   Sinus rhythm with rare PAC's and PVC's.  Brief monitoring period (<3 days). __________   LHC 11/2017: Dist LM lesion is 30% stenosed. Mid LAD lesion is 95% stenosed. Lat 1st Mrg lesion is 90% stenosed. Prox Cx to  Mid Cx lesion is 80% stenosed. 2nd Mrg lesion is 80% stenosed. Prox RCA lesion is 80% stenosed. Mid RCA lesion is 100% stenosed. Dist RCA lesion is 90% stenosed. LIMA graft was visualized by angiography and is normal in caliber. The graft exhibits no disease. SVG graft was visualized by angiography and is normal in caliber. The graft exhibits no disease. SVG graft was visualized by angiography and is very large. The graft exhibits no disease. LV end diastolic pressure is normal.   1. Severe 3 vessel occlusive CAD.     - 95% mid LAD    - 90% first OM. This is a small caliber vessel and was not bypassed.    - 80-85% anastomotic lesion at the insertion of the SVG into OM 2.    - 100% mid RCA 2. Patent LIMA to the LAD 3. Patent SVG to OM 2 4. Patent SVG to PDA 5. Normal LVEDP.   Plan: continue medical therapy. Perhaps the Myoview findings could be explained base on disease in OM1. While there is an anastomotic lesion at the touch down of the SVG to OM2 there is a large graft to native vessel size differential that makes this unfavorable for PCI. I think it is unlikely that this is the cause of her symptoms.   EKG:  EKG is ordered today.  The EKG ordered today demonstrates ***  Recent Labs: 05/08/2022: ALT 11; BUN 11; Creatinine, Ser 1.09; Hemoglobin 11.5; Platelets 321; Potassium 3.8; Sodium 138; TSH 3.436  Recent Lipid Panel    Component Value Date/Time   CHOL 114 05/08/2022 1723   CHOL 119 03/20/2021 1556   TRIG 123 05/08/2022 1723   HDL 33 (L) 05/08/2022 1723   HDL 34 (L) 03/20/2021 1556   CHOLHDL 3.5 05/08/2022 1723   VLDL 25 05/08/2022 1723   LDLCALC 56 05/08/2022 1723   LDLCALC 63 03/20/2021 1556   LDLDIRECT 62 03/20/2021 1556    PHYSICAL EXAM:    VS:  There were no vitals  taken for this visit.  BMI: There is no height or weight on file to calculate BMI.  Physical Exam  Wt Readings from Last 3 Encounters:  05/08/22 191 lb (86.6 kg)  11/07/21 196 lb (88.9 kg)   03/20/21 198 lb (89.8 kg)     ASSESSMENT & PLAN:   CAD with stable***angina:  Pulmonary hypertension with COPD and ongoing tobacco use with dyspnea:  HTN: Blood pressure  HLD: LDL 56 in 04/2022.   {Are you ordering a CV Procedure (e.g. stress test, cath, DCCV, TEE, etc)?   Press F2        :[id number]}     Disposition: F/u with Dr. Okey DupreEnd or an APP in ***.   Medication Adjustments/Labs and Tests Ordered: Current medicines are reviewed at length with the patient today.  Concerns regarding medicines are outlined above. Medication changes, Labs and Tests ordered today are summarized above and listed in the Patient Instructions accessible in Encounters.   Signed, Eula Listenyan Kaoir Loree, PA-C 08/06/2022 2:16 PM     [facility] [place] [address] [place], [place] [address] [phone]

## 2022-08-07 ENCOUNTER — Other Ambulatory Visit: Payer: Self-pay

## 2022-08-07 DIAGNOSIS — E785 Hyperlipidemia, unspecified: Secondary | ICD-10-CM

## 2022-08-07 MED ORDER — ATORVASTATIN CALCIUM 80 MG PO TABS: ORAL_TABLET | ORAL | 0 refills | Status: DC

## 2022-08-09 ENCOUNTER — Ambulatory Visit: Payer: Medicare Other | Admitting: Physician Assistant

## 2022-08-22 ENCOUNTER — Ambulatory Visit (INDEPENDENT_AMBULATORY_CARE_PROVIDER_SITE_OTHER): Payer: Medicare Other | Admitting: Pulmonary Disease

## 2022-08-22 ENCOUNTER — Encounter: Payer: Self-pay | Admitting: Pulmonary Disease

## 2022-08-22 VITALS — BP 134/72 | HR 68 | Temp 98.5°F | Ht 66.0 in | Wt 180.8 lb

## 2022-08-22 DIAGNOSIS — K219 Gastro-esophageal reflux disease without esophagitis: Secondary | ICD-10-CM

## 2022-08-22 DIAGNOSIS — F17290 Nicotine dependence, other tobacco product, uncomplicated: Secondary | ICD-10-CM | POA: Diagnosis not present

## 2022-08-22 DIAGNOSIS — R0602 Shortness of breath: Secondary | ICD-10-CM | POA: Diagnosis not present

## 2022-08-22 DIAGNOSIS — R059 Cough, unspecified: Secondary | ICD-10-CM | POA: Diagnosis not present

## 2022-08-22 DIAGNOSIS — J449 Chronic obstructive pulmonary disease, unspecified: Secondary | ICD-10-CM

## 2022-08-22 LAB — NITRIC OXIDE: Nitric Oxide: 11

## 2022-08-22 MED ORDER — TRELEGY ELLIPTA 100-62.5-25 MCG/ACT IN AEPB: 1.0000 | INHALATION_SPRAY | Freq: Every day | RESPIRATORY_TRACT | 11 refills | Status: DC

## 2022-08-22 MED ORDER — TRELEGY ELLIPTA 100-62.5-25 MCG/ACT IN AEPB: 1.0000 | INHALATION_SPRAY | Freq: Every day | RESPIRATORY_TRACT | 0 refills | Status: DC
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
## 2022-08-22 NOTE — Patient Instructions (Addendum)
We will put you back on Trelegy 100, 1 puff daily.  Please quit smoking the cigars.  We will order breathing tests.  We will see you in follow-up in 6 to 8 weeks time call sooner should any new problems arise.

## 2022-09-16 ENCOUNTER — Emergency Department: Payer: Medicare Other

## 2022-09-16 ENCOUNTER — Emergency Department
Admission: EM | Admit: 2022-09-16 | Discharge: 2022-09-16 | Disposition: A | Discharge status: Home / Self Care | Payer: Medicare Other | Attending: Emergency Medicine | Admitting: Emergency Medicine

## 2022-09-16 DIAGNOSIS — R0602 Shortness of breath: Secondary | ICD-10-CM | POA: Diagnosis not present

## 2022-09-16 DIAGNOSIS — Y92410 Unspecified street and highway as the place of occurrence of the external cause: Secondary | ICD-10-CM | POA: Diagnosis not present

## 2022-09-16 DIAGNOSIS — M542 Cervicalgia: Secondary | ICD-10-CM | POA: Insufficient documentation

## 2022-09-16 DIAGNOSIS — S0990XA Unspecified injury of head, initial encounter: Secondary | ICD-10-CM | POA: Diagnosis present

## 2022-09-16 DIAGNOSIS — J449 Chronic obstructive pulmonary disease, unspecified: Secondary | ICD-10-CM | POA: Insufficient documentation

## 2022-09-16 DIAGNOSIS — I2510 Atherosclerotic heart disease of native coronary artery without angina pectoris: Secondary | ICD-10-CM | POA: Insufficient documentation

## 2022-09-16 DIAGNOSIS — I10 Essential (primary) hypertension: Secondary | ICD-10-CM | POA: Insufficient documentation

## 2022-09-16 DIAGNOSIS — R0789 Other chest pain: Secondary | ICD-10-CM | POA: Diagnosis not present

## 2022-09-16 DIAGNOSIS — E119 Type 2 diabetes mellitus without complications: Secondary | ICD-10-CM | POA: Diagnosis not present

## 2022-09-16 LAB — CBC
HCT: 35.9 % — ABNORMAL LOW (ref 36.0–46.0)
Hemoglobin: 11.7 g/dL — ABNORMAL LOW (ref 12.0–15.0)
MCH: 29.9 pg (ref 26.0–34.0)
MCHC: 32.6 g/dL (ref 30.0–36.0)
MCV: 91.8 fL (ref 80.0–100.0)
Platelets: 427 10*3/uL — ABNORMAL HIGH (ref 150–400)
RBC: 3.91 MIL/uL (ref 3.87–5.11)
RDW: 13.7 % (ref 11.5–15.5)
WBC: 12.4 10*3/uL — ABNORMAL HIGH (ref 4.0–10.5)
nRBC: 0 % (ref 0.0–0.2)

## 2022-09-16 LAB — BASIC METABOLIC PANEL
Anion gap: 7 (ref 5–15)
BUN: 12 mg/dL (ref 8–23)
CO2: 27 mmol/L (ref 22–32)
Calcium: 9.3 mg/dL (ref 8.9–10.3)
Chloride: 107 mmol/L (ref 98–111)
Creatinine, Ser: 0.91 mg/dL (ref 0.44–1.00)
GFR, Estimated: >60 mL/min (ref >60)
Glucose, Bld: 98 mg/dL (ref 70–99)
Potassium: 3.8 mmol/L (ref 3.5–5.1)
Sodium: 141 mmol/L (ref 135–145)

## 2022-09-16 LAB — TROPONIN I (HIGH SENSITIVITY)
Troponin I (High Sensitivity): 5 ng/L (ref <18)
Troponin I (High Sensitivity): 7 ng/L (ref <18)

## 2022-09-16 LAB — HEPATIC FUNCTION PANEL
ALT: 9 U/L (ref 0–44)
AST: 18 U/L (ref 15–41)
Albumin: 3.6 g/dL (ref 3.5–5.0)
Alkaline Phosphatase: 81 U/L (ref 38–126)
Bilirubin, Direct: 0.1 mg/dL (ref 0.0–0.2)
Indirect Bilirubin: 0.6 mg/dL (ref 0.3–0.9)
Total Bilirubin: 0.7 mg/dL (ref 0.3–1.2)
Total Protein: 7.5 g/dL (ref 6.5–8.1)

## 2022-09-16 LAB — LIPASE, BLOOD: Lipase: 43 U/L (ref 11–51)

## 2022-09-16 MED ORDER — ONDANSETRON HCL 4 MG/2ML IJ SOLN
4.0000 mg | Freq: Once | INTRAMUSCULAR | Status: AC
  Administered 2022-09-16: 4 mg via INTRAVENOUS
  Filled 2022-09-16: qty 2

## 2022-09-16 MED ORDER — METHYLPREDNISOLONE SODIUM SUCC 125 MG IJ SOLR
125.0000 mg | Freq: Once | INTRAMUSCULAR | Status: AC
  Administered 2022-09-16: 125 mg via INTRAVENOUS
  Filled 2022-09-16: qty 2

## 2022-09-16 MED ORDER — OXYCODONE-ACETAMINOPHEN 5-325 MG PO TABS
2.0000 | ORAL_TABLET | Freq: Once | ORAL | Status: AC
  Administered 2022-09-16: 2 via ORAL
  Filled 2022-09-16: qty 2

## 2022-09-16 MED ORDER — HYDROCODONE-ACETAMINOPHEN 5-325 MG PO TABS: 1.0000 | ORAL_TABLET | Freq: Four times a day (QID) | ORAL | 0 refills | Status: AC | PRN

## 2022-09-16 MED ORDER — IPRATROPIUM-ALBUTEROL 0.5-2.5 (3) MG/3ML IN SOLN
3.0000 mL | Freq: Once | RESPIRATORY_TRACT | Status: AC
  Administered 2022-09-16: 3 mL via RESPIRATORY_TRACT
  Filled 2022-09-16: qty 3

## 2022-09-16 MED ORDER — IBUPROFEN 600 MG PO TABS: 600.0000 mg | ORAL_TABLET | Freq: Four times a day (QID) | ORAL | 0 refills | Status: DC | PRN

## 2022-09-16 MED ORDER — PREDNISONE 20 MG PO TABS: 40.0000 mg | ORAL_TABLET | Freq: Every day | ORAL | 0 refills | Status: AC

## 2022-09-16 MED ORDER — KETOROLAC TROMETHAMINE 30 MG/ML IJ SOLN
15.0000 mg | Freq: Once | INTRAMUSCULAR | Status: AC
  Administered 2022-09-16: 15 mg via INTRAVENOUS
  Filled 2022-09-16: qty 1
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
## 2022-09-16 NOTE — ED Notes (Signed)
Pt was found to be taking nitro again after she was educated on why we did not want her too and the uncertainty of the CP at this time but since reproducible pt asked to not take anymore at this time. MD and PA aware. Dr. Fanny Bien speaking to pt at this time.
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
## 2022-09-30 ENCOUNTER — Other Ambulatory Visit: Payer: Self-pay | Admitting: Physician Assistant

## 2022-09-30 ENCOUNTER — Ambulatory Visit: Payer: Medicare Other | Attending: Physician Assistant | Admitting: Physician Assistant

## 2022-09-30 ENCOUNTER — Other Ambulatory Visit
Admission: RE | Admit: 2022-09-30 | Discharge: 2022-09-30 | Disposition: A | Discharge status: Home / Self Care | Payer: Medicare Other | Source: Ambulatory Visit | Attending: Physician Assistant | Admitting: Physician Assistant

## 2022-09-30 ENCOUNTER — Ambulatory Visit: Payer: Medicare Other | Attending: Physician Assistant

## 2022-09-30 ENCOUNTER — Encounter: Payer: Self-pay | Admitting: Physician Assistant

## 2022-09-30 ENCOUNTER — Telehealth: Payer: Self-pay | Admitting: *Deleted

## 2022-09-30 VITALS — BP 138/78 | HR 65 | Ht 66.0 in | Wt 181.0 lb

## 2022-09-30 DIAGNOSIS — Z951 Presence of aortocoronary bypass graft: Secondary | ICD-10-CM | POA: Diagnosis present

## 2022-09-30 DIAGNOSIS — I200 Unstable angina: Secondary | ICD-10-CM | POA: Diagnosis not present

## 2022-09-30 DIAGNOSIS — I10 Essential (primary) hypertension: Secondary | ICD-10-CM | POA: Insufficient documentation

## 2022-09-30 DIAGNOSIS — E785 Hyperlipidemia, unspecified: Secondary | ICD-10-CM

## 2022-09-30 DIAGNOSIS — R55 Syncope and collapse: Secondary | ICD-10-CM

## 2022-09-30 DIAGNOSIS — I25118 Atherosclerotic heart disease of native coronary artery with other forms of angina pectoris: Secondary | ICD-10-CM | POA: Diagnosis present

## 2022-09-30 LAB — CBC
HCT: 37.4 % (ref 36.0–46.0)
Hemoglobin: 11.6 g/dL — ABNORMAL LOW (ref 12.0–15.0)
MCH: 29.4 pg (ref 26.0–34.0)
MCHC: 31 g/dL (ref 30.0–36.0)
MCV: 94.7 fL (ref 80.0–100.0)
Platelets: 417 10*3/uL — ABNORMAL HIGH (ref 150–400)
RBC: 3.95 MIL/uL (ref 3.87–5.11)
RDW: 14.2 % (ref 11.5–15.5)
WBC: 13.2 10*3/uL — ABNORMAL HIGH (ref 4.0–10.5)
nRBC: 0 % (ref 0.0–0.2)

## 2022-09-30 LAB — BASIC METABOLIC PANEL
Anion gap: 4 — ABNORMAL LOW (ref 5–15)
BUN: 10 mg/dL (ref 8–23)
CO2: 28 mmol/L (ref 22–32)
Calcium: 8.9 mg/dL (ref 8.9–10.3)
Chloride: 107 mmol/L (ref 98–111)
Creatinine, Ser: 1.01 mg/dL — ABNORMAL HIGH (ref 0.44–1.00)
GFR, Estimated: >60 mL/min (ref >60)
Glucose, Bld: 141 mg/dL — ABNORMAL HIGH (ref 70–99)
Potassium: 3.7 mmol/L (ref 3.5–5.1)
Sodium: 139 mmol/L (ref 135–145)

## 2022-09-30 NOTE — Patient Instructions (Addendum)
Medication Instructions:  No changes at this time.   *If you need a refill on your cardiac medications before your next appointment, please call your pharmacy*   Lab Work: CBC & BMET at the [facility] entrance. Stop at registration desk for check in.   If you have labs (blood work) drawn today and your tests are completely normal, you will receive your results only by: MyChart Message (if you have MyChart) OR A paper copy in the mail If you have any lab test that is abnormal or we need to change your treatment, we will call you to review the results.   Testing/Procedures:  [facility] A DEPT OF [place]. [facility] [place] A DEPT OF [place]. [facility] [address], [address] [place] [address] Dept: [phone] Loc: [phone]  Caitlyn Herrera  09/30/2022  You are scheduled for a Cardiac Catheterization on Tuesday, December 14 with Dr. Cristal De[place] End.  1. Please arrive at the [facility] Entrance and check in at registration at 8:30 AM. (This time is two hours before your procedure to ensure your preparation). Free valet parking service is available.   Special note: Every effort is made to have your procedure done on time. Please understand that emergencies sometimes delay scheduled procedures.  2. Diet: Do not eat solid foods after midnight.  The patient may have clear liquids until 5am upon the day of the procedure.  3. Labs: You will need to have blood drawn today over at the [facility] entrance.  4. Medication instructions in preparation for your procedure:   Contrast Allergy: No   Do not take Diabetes Med Janumet (Metformin + Sitagliptin) on the day of the procedure and HOLD 48 HOURS AFTER THE PROCEDURE.  On the morning of your procedure, take your Aspirin 81 mg and any morning medicines NOT listed above.  You may use sips of water.  5. Plan for one night stay--bring personal  belongings. 6. Bring a current list of your medications and current insurance cards. 7. You MUST have a responsible person to drive you home. 8. Someone MUST be with you the first 24 hours after you arrive home or your discharge will be delayed. 9. Please wear clothes that are easy to get on and off and wear slip-on shoes.  Thank you for allowing [place] to care for you!   --  Invasive Cardiovascular services    Your physician has recommended that you wear a Zio AT Live monitor for 14 days.   This monitor is a medical device that records the heart's electrical activity. Doctors most often use these monitors to diagnose arrhythmias. Arrhythmias are problems with the speed or rhythm of the heartbeat. The monitor is a small device applied to your chest. You can wear one while you do your normal daily activities. While wearing this monitor if you have any symptoms to push the button and record what you felt. Once you have worn this monitor for the period of time provider prescribed (Usually 14 days), you will return the monitor device in the postage paid box/bag. Once it is returned they will download the data collected and provide [place] with a report which the provider will then review and we will call you with those results.   Important tips:  Avoid showering during the first 24 hours of wearing the monitor. Avoid excessive sweating to help maximize wear time. Do not submerge the device, no  hot tubs, and no swimming pools. Keep any lotions or oils away from the patch. After 24 hours you may shower with the patch on. Take brief showers with your back facing the shower head.  Do not remove patch once it has been placed because that will interrupt data and decrease adhesive wear time. Push the button when you have any symptoms and write down what you were feeling. Once you have completed wearing your monitor, remove and place into box which has postage paid and place in your outgoing mailbox.   If for some reason you have misplaced your box then call our office and we can provide another box and/or mail it off for you. Keep the transmitter within 10 feet at all times.  Expect a welcome phone call within [id number] hrs of application from [place].  This call will include your copay information, so please answer any unknown phone calls while wearing Zio (it could also be important information about your heart) The envelope to return Zio is in the back of the transmitter. Removal instructions are on the last page of the symptom diary.  Place the patch sticky side up inside the transmitter and the symptom diary inside the envelope to return on your last wear day inside your mailbox or any USPS mailbox.  Follow-Up: At [facility], you and your health needs are our priority.  As part of our continuing mission to provide you with exceptional heart care, we have created designated Provider Care Teams.  These Care Teams include your primary Cardiologist (physician) and Advanced Practice Providers (APPs -  Physician Assistants and Nurse Practitioners) who all work together to provide you with the care you need, when you need it.   Your next appointment:   1 month(s)  The format for your next appointment:   In Person  Provider:   Nelva Bush, MD or Christell Faith, PA-C         Important Information About [place]

## 2022-09-30 NOTE — Progress Notes (Signed)
Precath orders signed and held.

## 2022-09-30 NOTE — Telephone Encounter (Signed)
Spoke with patient and reviewed that provider wanted her to wear heart monitor for 2 weeks. Advised that I will send her instructions on the monitor and she wanted to update her address. This was corrected in her chart. Reviewed that it will have all information but to wait until after her procedure before wearing. She verbalized understanding with no further questions at this time.

## 2022-10-02 ENCOUNTER — Other Ambulatory Visit: Payer: Self-pay | Admitting: Internal Medicine

## 2022-10-04 ENCOUNTER — Ambulatory Visit: Payer: Medicare Other | Admitting: Pulmonary Disease

## 2022-10-08 ENCOUNTER — Encounter: Payer: Self-pay | Admitting: Internal Medicine

## 2022-10-08 ENCOUNTER — Other Ambulatory Visit: Payer: Self-pay

## 2022-10-08 ENCOUNTER — Ambulatory Visit
Admission: RE | Admit: 2022-10-08 | Discharge: 2022-10-08 | Disposition: A | Discharge status: Home / Self Care | Payer: Medicare Other | Attending: Internal Medicine | Admitting: Internal Medicine

## 2022-10-08 ENCOUNTER — Encounter
Admission: RE | Disposition: A | Discharge status: Home / Self Care | Payer: Self-pay | Source: Home / Self Care | Attending: Internal Medicine

## 2022-10-08 DIAGNOSIS — I25708 Atherosclerosis of coronary artery bypass graft(s), unspecified, with other forms of angina pectoris: Secondary | ICD-10-CM | POA: Insufficient documentation

## 2022-10-08 DIAGNOSIS — I10 Essential (primary) hypertension: Secondary | ICD-10-CM | POA: Diagnosis not present

## 2022-10-08 DIAGNOSIS — I272 Pulmonary hypertension, unspecified: Secondary | ICD-10-CM | POA: Insufficient documentation

## 2022-10-08 DIAGNOSIS — Z7982 Long term (current) use of aspirin: Secondary | ICD-10-CM | POA: Insufficient documentation

## 2022-10-08 DIAGNOSIS — J449 Chronic obstructive pulmonary disease, unspecified: Secondary | ICD-10-CM | POA: Insufficient documentation

## 2022-10-08 DIAGNOSIS — F17290 Nicotine dependence, other tobacco product, uncomplicated: Secondary | ICD-10-CM | POA: Insufficient documentation

## 2022-10-08 DIAGNOSIS — Z79899 Other long term (current) drug therapy: Secondary | ICD-10-CM | POA: Diagnosis not present

## 2022-10-08 DIAGNOSIS — I252 Old myocardial infarction: Secondary | ICD-10-CM | POA: Diagnosis not present

## 2022-10-08 DIAGNOSIS — I2510 Atherosclerotic heart disease of native coronary artery without angina pectoris: Secondary | ICD-10-CM

## 2022-10-08 DIAGNOSIS — I25118 Atherosclerotic heart disease of native coronary artery with other forms of angina pectoris: Secondary | ICD-10-CM

## 2022-10-08 DIAGNOSIS — E785 Hyperlipidemia, unspecified: Secondary | ICD-10-CM | POA: Diagnosis not present

## 2022-10-08 DIAGNOSIS — E119 Type 2 diabetes mellitus without complications: Secondary | ICD-10-CM | POA: Insufficient documentation

## 2022-10-08 DIAGNOSIS — R55 Syncope and collapse: Secondary | ICD-10-CM | POA: Insufficient documentation

## 2022-10-08 DIAGNOSIS — K219 Gastro-esophageal reflux disease without esophagitis: Secondary | ICD-10-CM | POA: Diagnosis not present

## 2022-10-08 HISTORY — PX: RIGHT/LEFT HEART CATH AND CORONARY ANGIOGRAPHY: CATH118266

## 2022-10-08 LAB — POCT I-STAT 7, (LYTES, BLD GAS, ICA,H+H)
Acid-base deficit: 3 mmol/L — ABNORMAL HIGH (ref 0.0–2.0)
Bicarbonate: 21.3 mmol/L (ref 20.0–28.0)
Calcium, Ion: 1.2 mmol/L (ref 1.15–1.40)
HCT: 32 % — ABNORMAL LOW (ref 36.0–46.0)
Hemoglobin: 10.9 g/dL — ABNORMAL LOW (ref 12.0–15.0)
O2 Saturation: 96 %
Potassium: 3.9 mmol/L (ref 3.5–5.1)
Sodium: 136 mmol/L (ref 135–145)
TCO2: 22 mmol/L (ref 22–32)
pCO2 arterial: 33.8 mmHg (ref 32–48)
pH, Arterial: 7.407 (ref 7.35–7.45)
pO2, Arterial: 82 mmHg — ABNORMAL LOW (ref 83–108)

## 2022-10-08 LAB — POCT I-STAT EG7
Acid-base deficit: 2 mmol/L (ref 0.0–2.0)
Bicarbonate: 23.1 mmol/L (ref 20.0–28.0)
Calcium, Ion: 1.25 mmol/L (ref 1.15–1.40)
HCT: 33 % — ABNORMAL LOW (ref 36.0–46.0)
Hemoglobin: 11.2 g/dL — ABNORMAL LOW (ref 12.0–15.0)
O2 Saturation: 67 %
Potassium: 4.1 mmol/L (ref 3.5–5.1)
Sodium: 139 mmol/L (ref 135–145)
TCO2: 24 mmol/L (ref 22–32)
pCO2, Ven: 40.8 mmHg — ABNORMAL LOW (ref 44–60)
pH, Ven: 7.362 (ref 7.25–7.43)
pO2, Ven: 36 mmHg (ref 32–45)

## 2022-10-08 LAB — GLUCOSE, CAPILLARY: Glucose-Capillary: 115 mg/dL — ABNORMAL HIGH (ref 70–99)

## 2022-10-08 SURGERY — RIGHT/LEFT HEART CATH AND CORONARY ANGIOGRAPHY
Anesthesia: Moderate Sedation | Laterality: Bilateral

## 2022-10-08 MED ORDER — ACETAMINOPHEN 325 MG PO TABS
650.0000 mg | ORAL_TABLET | ORAL | Status: DC | PRN
  Administered 2022-10-08: 650 mg via ORAL

## 2022-10-08 MED ORDER — HYDRALAZINE HCL 20 MG/ML IJ SOLN: 10.0000 mg | INTRAMUSCULAR | Status: DC | PRN

## 2022-10-08 MED ORDER — SODIUM CHLORIDE 0.9 % IV SOLN: INTRAVENOUS | Status: DC

## 2022-10-08 MED ORDER — LABETALOL HCL 5 MG/ML IV SOLN: 10.0000 mg | INTRAVENOUS | Status: DC | PRN

## 2022-10-08 MED ORDER — ACETAMINOPHEN 325 MG PO TABS
ORAL_TABLET | ORAL | Status: AC
  Filled 2022-10-08: qty 2

## 2022-10-08 MED ORDER — ASPIRIN 81 MG PO CHEW: 81.0000 mg | CHEWABLE_TABLET | Freq: Once | ORAL | Status: AC

## 2022-10-08 MED ORDER — SODIUM CHLORIDE 0.9% FLUSH: 3.0000 mL | Freq: Two times a day (BID) | INTRAVENOUS | Status: DC

## 2022-10-08 MED ORDER — LIDOCAINE HCL (PF) 1 % IJ SOLN
INTRAMUSCULAR | Status: DC | PRN
  Administered 2022-10-08: 2 mL

## 2022-10-08 MED ORDER — VERAPAMIL HCL 2.5 MG/ML IV SOLN
INTRAVENOUS | Status: AC
  Filled 2022-10-08: qty 2

## 2022-10-08 MED ORDER — LIDOCAINE HCL 1 % IJ SOLN
INTRAMUSCULAR | Status: AC
  Filled 2022-10-08: qty 20

## 2022-10-08 MED ORDER — MIDAZOLAM HCL 2 MG/2ML IJ SOLN
INTRAMUSCULAR | Status: AC
  Filled 2022-10-08: qty 2

## 2022-10-08 MED ORDER — FENTANYL CITRATE (PF) 100 MCG/2ML IJ SOLN
INTRAMUSCULAR | Status: DC | PRN
  Administered 2022-10-08 (×2): 25 ug via INTRAVENOUS

## 2022-10-08 MED ORDER — HEPARIN (PORCINE) IN NACL 1000-0.9 UT/500ML-% IV SOLN
INTRAVENOUS | Status: DC | PRN
  Administered 2022-10-08: 1000 mL

## 2022-10-08 MED ORDER — ASPIRIN 81 MG PO CHEW
CHEWABLE_TABLET | ORAL | Status: AC
  Administered 2022-10-08: 81 mg via ORAL
  Filled 2022-10-08: qty 1

## 2022-10-08 MED ORDER — VERAPAMIL HCL 2.5 MG/ML IV SOLN
INTRAVENOUS | Status: DC | PRN
  Administered 2022-10-08 (×2): 2.5 mg via INTRA_ARTERIAL

## 2022-10-08 MED ORDER — HYDRALAZINE HCL 20 MG/ML IJ SOLN
INTRAMUSCULAR | Status: AC
  Administered 2022-10-08: 10 mg via INTRAVENOUS
  Filled 2022-10-08: qty 1

## 2022-10-08 MED ORDER — HEPARIN (PORCINE) IN NACL 1000-0.9 UT/500ML-% IV SOLN
INTRAVENOUS | Status: AC
  Filled 2022-10-08: qty 1000

## 2022-10-08 MED ORDER — ONDANSETRON HCL 4 MG/2ML IJ SOLN: 4.0000 mg | Freq: Four times a day (QID) | INTRAMUSCULAR | Status: DC | PRN

## 2022-10-08 MED ORDER — HEPARIN SODIUM (PORCINE) 1000 UNIT/ML IJ SOLN
INTRAMUSCULAR | Status: AC
  Filled 2022-10-08: qty 10

## 2022-10-08 MED ORDER — FENTANYL CITRATE (PF) 100 MCG/2ML IJ SOLN
INTRAMUSCULAR | Status: AC
  Filled 2022-10-08: qty 2

## 2022-10-08 MED ORDER — ISOSORBIDE MONONITRATE ER 120 MG PO TB24: 120.0000 mg | ORAL_TABLET | Freq: Every morning | ORAL | 3 refills | Status: DC

## 2022-10-08 MED ORDER — IOHEXOL 300 MG/ML  SOLN
INTRAMUSCULAR | Status: DC | PRN
  Administered 2022-10-08: 67 mL

## 2022-10-08 MED ORDER — MIDAZOLAM HCL 2 MG/2ML IJ SOLN
INTRAMUSCULAR | Status: DC | PRN
  Administered 2022-10-08 (×2): 1 mg via INTRAVENOUS

## 2022-10-08 MED ORDER — SODIUM CHLORIDE 0.9% FLUSH: 3.0000 mL | INTRAVENOUS | Status: DC | PRN

## 2022-10-08 MED ORDER — SODIUM CHLORIDE 0.9 % IV SOLN: 250.0000 mL | INTRAVENOUS | Status: DC | PRN

## 2022-10-08 MED ORDER — HEPARIN SODIUM (PORCINE) 1000 UNIT/ML IJ SOLN
INTRAMUSCULAR | Status: DC | PRN
  Administered 2022-10-08: 4000 [IU] via INTRAVENOUS

## 2022-10-08 SURGICAL SUPPLY — 16 items
BAND ZEPHYR COMPRESS 30 LONG (HEMOSTASIS) IMPLANT
CATH INFINITI 5 FR IM (CATHETERS) IMPLANT
CATH INFINITI 5 FR JL3.5 (CATHETERS) IMPLANT
CATH INFINITI 5 FR MPA2 (CATHETERS) IMPLANT
CATH SWAN GANZ 7F STRAIGHT (CATHETERS) IMPLANT
DRAPE BRACHIAL (DRAPES) IMPLANT
GLIDESHEATH SLEND A-KIT 6F 22G (SHEATH) IMPLANT
GLIDESHEATH SLENDER 7FR .021G (SHEATH) IMPLANT
GUIDEWIRE .025 260CM (WIRE) IMPLANT
GUIDEWIRE INQWIRE 1.5J.035X260 (WIRE) IMPLANT
INQWIRE 1.5J .035X260CM (WIRE) ×1
PACK CARDIAC CATH (CUSTOM PROCEDURE TRAY) ×1 IMPLANT
PROTECTION STATION PRESSURIZED (MISCELLANEOUS) ×1
SET ATX SIMPLICITY (MISCELLANEOUS) IMPLANT
STATION PROTECTION PRESSURIZED (MISCELLANEOUS) IMPLANT
WIRE HITORQ VERSACORE ST 145CM (WIRE) IMPLANT

## 2022-10-08 NOTE — Progress Notes (Signed)
[facility]  Date: 10/08/22 Time: 2:22 PM  I was alerted by the patient's RN that she developed a severe HA after going to the bathroom.  No focal neurologic deficits were noted.  She was given APAP with significant improvement in HA within 15 minutes of receiving the medication.  On my evaluation, she complains of a 3/10 frontal HA.  She is able to move all 4 extremities.  CN 3-12 are intact.  We will continue to monitor.  If headache resolves and she does not have any other focal neuro changes, we will plan for d/c as planned later this afternoon.  Yvonne Kendall, MD [facility]

## 2022-10-08 NOTE — Interval H&P Note (Signed)
History and Physical Interval Note:  10/08/2022 9:54 AM  Caitlyn Herrera  has presented today for surgery, with the diagnosis of coronary artery disease with stable angina.  The various methods of treatment have been discussed with the patient and family. After consideration of risks, benefits and other options for treatment, the patient has consented to  Procedure(s): RIGHT/LEFT HEART CATH AND CORONARY ANGIOGRAPHY (Bilateral) as a surgical intervention.  The patient's history has been reviewed, patient examined, no change in status, stable for surgery.  I have reviewed the patient's chart and labs.  Questions were answered to the patient's satisfaction.    Cath Lab Visit (complete for each Cath Lab visit)  Clinical Evaluation Leading to the Procedure:   ACS: No.  Non-ACS:    Anginal Classification: CCS IV  Anti-ischemic medical therapy: Maximal Therapy (2 or more classes of medications)  Non-Invasive Test Results: Low-risk stress test findings: cardiac mortality <1%/year  Prior CABG: Previous CABG  Micaiah Litle

## 2022-10-09 ENCOUNTER — Encounter: Payer: Self-pay | Admitting: Internal Medicine

## 2022-10-11 ENCOUNTER — Other Ambulatory Visit: Payer: Self-pay | Admitting: Physician Assistant

## 2022-10-11 DIAGNOSIS — Z1231 Encounter for screening mammogram for malignant neoplasm of breast: Secondary | ICD-10-CM

## 2022-10-24 ENCOUNTER — Ambulatory Visit: Payer: Medicare Other | Attending: Pulmonary Disease

## 2022-10-24 DIAGNOSIS — J449 Chronic obstructive pulmonary disease, unspecified: Secondary | ICD-10-CM

## 2022-10-24 LAB — PULMONARY FUNCTION TEST ARMC ONLY
DL/VA % pred: 87 %
DL/VA: 3.57 ml/min/mmHg/L
DLCO unc % pred: 47 %
DLCO unc: 10.45 ml/min/mmHg
FEF 25-75 Post: 2.2 L/sec
FEF 25-75 Pre: 1.6 L/sec
FEF2575-%Change-Post: 37 %
FEF2575-%Pred-Post: 91 %
FEF2575-%Pred-Pre: 66 %
FEV1-%Change-Post: 11 %
FEV1-%Pred-Post: 58 %
FEV1-%Pred-Pre: 52 %
FEV1-Post: 1.6 L
FEV1-Pre: 1.44 L
FEV1FVC-%Change-Post: 0 %
FEV1FVC-%Pred-Pre: 106 %
FEV6-%Change-Post: 11 %
FEV6-%Pred-Post: 56 %
FEV6-%Pred-Pre: 50 %
FEV6-Post: 1.95 L
FEV6-Pre: 1.76 L
FEV6FVC-%Pred-Post: 103 %
FEV6FVC-%Pred-Pre: 103 %
FVC-%Change-Post: 11 %
FVC-%Pred-Post: 54 %
FVC-%Pred-Pre: 48 %
FVC-Post: 1.95 L
FVC-Pre: 1.76 L
Post FEV1/FVC ratio: 82 %
Post FEV6/FVC ratio: 100 %
Pre FEV1/FVC ratio: 82 %
Pre FEV6/FVC Ratio: 100 %
RV % pred: 74 %
RV: 1.64 L
TLC % pred: 64 %
TLC: 3.56 L
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
## 2022-10-31 ENCOUNTER — Encounter: Payer: Self-pay | Admitting: Physician Assistant

## 2022-10-31 ENCOUNTER — Telehealth: Payer: Self-pay | Admitting: Pulmonary Disease

## 2022-10-31 ENCOUNTER — Ambulatory Visit: Payer: Medicare Other | Attending: Physician Assistant | Admitting: Physician Assistant

## 2022-10-31 VITALS — BP 130/68 | HR 67 | Ht 66.0 in | Wt 179.4 lb

## 2022-10-31 DIAGNOSIS — E785 Hyperlipidemia, unspecified: Secondary | ICD-10-CM | POA: Diagnosis not present

## 2022-10-31 DIAGNOSIS — R55 Syncope and collapse: Secondary | ICD-10-CM

## 2022-10-31 DIAGNOSIS — I25118 Atherosclerotic heart disease of native coronary artery with other forms of angina pectoris: Secondary | ICD-10-CM

## 2022-10-31 DIAGNOSIS — Z951 Presence of aortocoronary bypass graft: Secondary | ICD-10-CM

## 2022-10-31 DIAGNOSIS — J449 Chronic obstructive pulmonary disease, unspecified: Secondary | ICD-10-CM

## 2022-10-31 DIAGNOSIS — Z720 Tobacco use: Secondary | ICD-10-CM

## 2022-10-31 DIAGNOSIS — I10 Essential (primary) hypertension: Secondary | ICD-10-CM | POA: Diagnosis not present

## 2022-10-31 MED ORDER — NICOTINE 14 MG/24HR TD PT24: 14.0000 mg | MEDICATED_PATCH | Freq: Every day | TRANSDERMAL | 0 refills | Status: DC

## 2022-10-31 NOTE — Telephone Encounter (Signed)
PFT was done on 10/24/2022.

## 2022-10-31 NOTE — Telephone Encounter (Signed)
I notified the patient. Nothing further needed. 

## 2022-10-31 NOTE — Patient Instructions (Signed)
Medication Instructions:  Your physician has recommended you make the following change in your medication:   Use Nicotine 14 mg patch once daily.   *If you need a refill on your cardiac medications before your next appointment, please call your pharmacy*   Lab Work: None  If you have labs (blood work) drawn today and your tests are completely normal, you will receive your results only by: [place] (if you have MyChart) OR A paper copy in the mail If you have any lab test that is abnormal or we need to change your treatment, we will call you to review the results.   Testing/Procedures: None   Follow-Up: At [facility], you and your health needs are our priority.  As part of our continuing mission to provide you with exceptional heart care, we have created designated Provider Care Teams.  These Care Teams include your primary Cardiologist (physician) and Advanced Practice Providers (APPs -  Physician Assistants and Nurse Practitioners) who all work together to provide you with the care you need, when you need it.   Your next appointment:   4 month(s)  The format for your next appointment:   In Person  Provider:   Nelva Bush, MD or Christell Faith, PA-C        Important Information About Sugar

## 2022-10-31 NOTE — Telephone Encounter (Signed)
The results have been released to [place].  We usually discuss breathing tests on follow-up appointment.  Briefly she has COPD with a asthma component that is moderate.  She also has evidence that increased weight does play a part so losing weight would help her tremendously.  In addition she needs to stop smoking completely.  We will see her follow-up in February and can discuss further.

## 2022-11-05 ENCOUNTER — Other Ambulatory Visit: Payer: Self-pay | Admitting: Internal Medicine

## 2022-11-05 DIAGNOSIS — E785 Hyperlipidemia, unspecified: Secondary | ICD-10-CM

## 2022-12-09 ENCOUNTER — Ambulatory Visit: Payer: 59 | Admitting: Pulmonary Disease

## 2022-12-31 ENCOUNTER — Other Ambulatory Visit: Payer: Self-pay | Admitting: Internal Medicine

## 2023-02-03 ENCOUNTER — Other Ambulatory Visit: Payer: Self-pay | Admitting: Internal Medicine

## 2023-02-03 DIAGNOSIS — E785 Hyperlipidemia, unspecified: Secondary | ICD-10-CM

## 2023-03-04 ENCOUNTER — Other Ambulatory Visit: Payer: Self-pay | Admitting: Physician Assistant

## 2023-03-04 DIAGNOSIS — Z1231 Encounter for screening mammogram for malignant neoplasm of breast: Secondary | ICD-10-CM

## 2023-03-06 NOTE — Progress Notes (Signed)
Cardiology Office Note    Date:  03/07/2023   ID:  Daysy, Bingle [birth year], MRN [id number]  PCP:  [name]  Cardiologist:  Yvonne Kendall, MD  Electrophysiologist:  None   Chief Complaint: Follow-up  History of Present Illness:   Caitlyn Herrera is a 62 y.o. female with history of CAD s/p 3-vessel CABG in 10/2017, mild pulmonary hypertension, COPD secondary to tobacco use, DM2, HTN, HLD, intermittent medical adherence, and GERD who presents for follow up of CAD.    She was admitted to the hospital in 10/2017 with an NSTEMI and underwent diagnostic LHC which revealed severe multivessel CAD.  Echo showed normal LV systolic function.  She subsequently underwent three-vessel CABG with LIMA to LAD, SVG to OM2, and SVG to RPDA.  She required repeat LHC in 11/2017 in the setting of subsequent admission with mild troponin elevation and abnormal stress testing which showed 3 of 3 patent grafts with native multivessel disease including 80% stenosis at the anastomosis of the SVG to OM2.  This area was not felt to be amenable to PCI and she was medically managed.  Since her bypass, she has been evaluated multiple times for complaints of palpitations with cardiac monitoring being unrevealing in 02/2018 and 04/2018.  On both occasions, duration of monitor compliance was limited.  She has also noted an intermittent productive cough that has been present since her CABG.  She was placed on Protonix by ENT though did not tolerate this.  She was seen virtually in 08/2018 and referred to pulmonology with symptoms felt to be mostly reflux in etiology.  She has been evaluated by GI with recommendation to proceed with EGD and colonoscopy though these have not been completed.  She was seen in the office in 02/2020 continuing to note shortness of breath and occasional palpitations.  Symptoms were largely unchanged when compared to symptoms noted since her CABG.  Imdur 15 mg daily was  added.  Her cath from 11/2017 was reviewed by interventional cardiology and it was noted large grafts were anastomosed to very small diseased native vessels.  Her shortness of breath was felt to be multifactorial including underlying CAD, COPD, and GERD.     She underwent echo in 05/2020 which demonstrated an EF of 60 to 65%, no regional wall motion abnormalities, mild LVH, normal LV diastolic function parameters, normal RV systolic function and ventricular cavity size, mildly elevated PASP at 37.3 mmHg, and mild mitral regurgitation.  She underwent Lexiscan MPI on 02/20/2021 which showed no significant ischemia or scar with an EF of 57% and was overall a low risk study.   She was seen in the office in 04/2022 and felt the same as she had at prior visits with intermittent chest heaviness while laying down at night or bending over as well as occasional sharp chest pain that would occur randomly, often when moving in certain ways.  She continued to have chronic fatigue and exertional dyspnea.  She did note some labile blood pressure readings at home.  Echo in 05/2022 demonstrated an EF of 55 to 60%, no regional wall motion abnormalities, normal LV diastolic function parameters, normal RV systolic function and ventricular cavity size, trivial mitral regurgitation, and an estimated right atrial pressure of 3 mmHg.  In the setting of progressive, persistent randomly occurring chest heaviness and dyspnea she underwent R/LHC on 10/08/2022 that showed severe native three-vessel CAD similar to prior cath in 2019 with a widely patent LIMA to LAD,  patent SVG to OM2 with 80% stenosis in OM2 just distal to the anastomosis as well as moderate ectasia of the distal portion of the SVG, occluded SVG to rPDA which did not appear to be acute though was new when compared to cath from 2019, normal left heart, right heart, and pulmonary artery pressures, normal cardiac output and index.  Medical therapy and risk factor modification were  recommended.  She was last seen in the office in 10/2022 and was without symptoms of angina or cardiac decompensation.  She reported her chest discomfort and dyspnea had improved.  She comes in today and is doing well from a cardiac perspective, without symptoms of angina or cardiac decompensation.  She reports she was seen by her PCP earlier this week for elevated BP with readings in the 180s systolic at home.  She indicates that she was started on what sounds like a thiazide diuretic, though this is unclear.  She reports her PCP obtained labs at that time with patient reporting labs to be normal.  She indicates her PCP will be following up a kidney function in 1 week.  Since starting her new BP medication, her BP has been well-controlled in the 130s systolic.  She remains adherent to remaining cardiac medications.  No dizziness, presyncope, or syncope.  No palpitations or dyspnea.  No lower extremity swelling or progressive orthopnea.   Labs independently reviewed: 09/2022 - Hgb 11.6, PLT 417, potassium 3.7, BUN 10, serum creatinine 1.01 08/2022 - albumin 3.6, AST/ALT normal 04/2022 - TSH normal, TC 114, TG 123, HDL 33, LDL 56  Past Medical History:  Diagnosis Date   Abdominal aortic ectasia (HCC)    a. 11/2017 CTA chest/abd/pelvis: 2.5 cm abd ao ectasia -->rec f/u u/s in 5 yrs.   CAD (coronary artery disease)    a. 11/04/17 Cath: Native multivessel dzs-->CABG x 3 (LIMA->LAD, VG->OM2, VG->RPDA; b. 11/2017 MV: mid antlat/apical isch; c. 11/2017 Cath: LM 30d, LAD 1m, LCX 80p/m, OM1 90, OM2 80 (@ anastamosis of graft), RCA 80p, 141m, 90d, LIMA->LAD nl, VG->OM2 nl, VG->RPDA nl-->Med Rx.   Carotid arterial disease (HCC)    a. 10/2017 Carotid U/S: 1-30% bilat ICA stenosis.   History of echocardiogram    a. 11/04/2017 Echo: EF of 65-70%, no RWMA, nl LV diastolic fxn, nl RV size/fxn, mild TR; b. 03/2018 Echo: EF 60-65%, mild LVH, no rwma, mild MR. Mildy reduced RV fxn. Mild TR.   History of Tobacco abuse     Hyperlipidemia    Hypertension    Left renal artery stenosis (HCC)    a. 11/2017 CTA Chest/Abd/Pelvis: 50-70% L RA stenosis.   Palpitations    a. 02/2018 Event Monitor: wore for 2 days, 21 hrs - rare PAC's/PVC's. Avg HR 66 (42-103); b. 04/2018 Event Monitor: Wore for 14 days. RSR, 67 (49-111). No significant arrhythmias.   Persistent cough    Pulmonary nodule, right    a. 10/2017 CT Chest: 5mm pulm nodule in R lung apex - rec f/u w/ non-contrast chest CT in 1 year.   Type II diabetes mellitus (HCC)    a. 10/2017 A1c 6.6.    Past Surgical History:  Procedure Laterality Date   BREAST BIOPSY Right 06/23/2018   US guided biopsy - awaiting pathology   BREAST CYST EXCISION Right    CARDIAC CATHETERIZATION     CORONARY ARTERY BYPASS GRAFT N/A 11/12/2017   Procedure: CORONARY ARTERY BYPASS GRAFTING (CABG) x Three , using left internal mammary artery and right leg greater saphenous  vein harvested endoscopically;  Surgeon: Kerin Perna, MD;  Location: [facility] OR;  Service: Open Heart Surgery;  Laterality: N/A;   DILATION AND CURETTAGE OF UTERUS     LAPAROSCOPIC CHOLECYSTECTOMY     LEFT HEART CATH AND CORONARY ANGIOGRAPHY N/A 11/04/2017   Procedure: LEFT HEART CATH AND CORONARY ANGIOGRAPHY;  Surgeon: Yvonne Kendall, MD;  Location: [facility] INVASIVE CV LAB;  Service: Cardiovascular;  Laterality: N/A;   LEFT HEART CATH AND CORS/GRAFTS ANGIOGRAPHY N/A 12/09/2017   Procedure: LEFT HEART CATH AND CORS/GRAFTS ANGIOGRAPHY;  Surgeon: [name], Peter M, MD;  Location: [facility] INVASIVE CV LAB;  Service: Cardiovascular;  Laterality: N/A;   RIGHT/LEFT HEART CATH AND CORONARY ANGIOGRAPHY Bilateral 10/08/2022   Procedure: RIGHT/LEFT HEART CATH AND CORONARY ANGIOGRAPHY;  Surgeon: Yvonne Kendall, MD;  Location: [facility] INVASIVE CV LAB;  Service: Cardiovascular;  Laterality: Bilateral;   TEE WITHOUT CARDIOVERSION N/A 11/12/2017   Procedure: TRANSESOPHAGEAL ECHOCARDIOGRAM (TEE);  Surgeon: Donata Clay, Theron Arista, MD;  Location: [facility] OR;   Service: Open Heart Surgery;  Laterality: N/A;    Current Medications: Current Meds  Medication Sig   albuterol (PROVENTIL HFA;VENTOLIN HFA) 108 (90 Base) MCG/ACT inhaler Inhale 2 puffs into the lungs every 6 (six) hours as needed for wheezing or shortness of breath.   aspirin EC 81 MG tablet Take 1 tablet (81 mg total) by mouth daily.   atorvastatin (LIPITOR) 80 MG tablet TAKE 1 TABLET(80 MG) BY MOUTH DAILY AT 6 PM   azelastine (ASTELIN) 0.1 % nasal spray Place 2 sprays into both nostrils as needed.   benzonatate (TESSALON) 100 MG capsule Take 100 mg by mouth as needed.    carvedilol (COREG) 12.5 MG tablet Take 1 tablet (12.5 mg total) by mouth 2 (two) times daily.   cetirizine (ZYRTEC) 10 MG tablet Take 10 mg by mouth daily.   chlorpheniramine-HYDROcodone (TUSSIONEX PENNKINETIC ER) 10-8 MG/5ML SUER Take 5 mLs by mouth 2 (two) times daily.   citalopram (CELEXA) 20 MG tablet Take 20 mg by mouth daily.   cyclobenzaprine (FLEXERIL) 10 MG tablet as needed.   dextromethorphan-guaiFENesin (ROBITUSSIN-DM) 10-100 MG/5ML liquid as needed.   ezetimibe (ZETIA) 10 MG tablet TAKE 1 TABLET(10 MG) BY MOUTH DAILY   fluticasone (FLONASE) 50 MCG/ACT nasal spray as needed.   Fluticasone-Umeclidin-Vilant (TRELEGY ELLIPTA) 100-62.5-25 MCG/ACT AEPB Inhale 1 puff into the lungs daily.   Fluticasone-Umeclidin-Vilant (TRELEGY ELLIPTA) 100-62.5-25 MCG/ACT AEPB Inhale 1 puff into the lungs daily.   HYDROcodone-acetaminophen (NORCO/VICODIN) 5-325 MG tablet Take 1-2 tablets by mouth every 6 (six) hours as needed for moderate pain or severe pain.   ibuprofen (ADVIL) 600 MG tablet Take 1 tablet (600 mg total) by mouth every 6 (six) hours as needed for moderate pain.   isosorbide mononitrate (IMDUR) 120 MG 24 hr tablet Take 1 tablet (120 mg total) by mouth every morning.   lidocaine (XYLOCAINE) 5 % ointment Apply 1 application topically daily as needed.   losartan (COZAAR) 100 MG tablet Take 100 mg by mouth daily.    meloxicam (MOBIC) 15 MG tablet Take 15 mg by mouth daily.   nicotine (NICODERM CQ - DOSED IN MG/24 HOURS) 14 mg/24hr patch Place 1 patch (14 mg total) onto the skin daily.   nitroGLYCERIN (NITROSTAT) 0.4 MG SL tablet Place 1 tablet (0.4 mg total) under the tongue every 5 (five) minutes as needed for chest pain.   omeprazole (PRILOSEC) 40 MG capsule TAKE 1 CAPSULE BY MOUTH EVERY DAY BEFORE BREAKFAST   ondansetron (ZOFRAN) 4 MG tablet Take 1 tablet (4  mg total) by mouth every 8 (eight) hours as needed for nausea or vomiting.   SitaGLIPtin-MetFORMIN HCl (JANUMET XR) 50-1000 MG TB24 Take 1 tablet by mouth daily.   tiZANidine (ZANAFLEX) 2 MG tablet Take 2 mg by mouth 3 (three) times daily.   [DISCONTINUED] carvedilol (COREG) 6.25 MG tablet TAKE 1 TABLET(6.25 MG) BY MOUTH TWICE DAILY WITH A MEAL    Allergies:   Patient has no known allergies.   Social History   Socioeconomic History   Marital status: Widowed    Spouse name: Not on file   Number of children: Not on file   Years of education: Not on file   Highest education level: Not on file  Occupational History    Employer: [employer]  Tobacco Use   Smoking status: Some Days    Packs/day: 1.00    Years: 42.00    Additional pack years: 0.00    Total pack years: 42.00    Types: Cigarettes, Cigars    Last attempt to quit: 11/01/2017    Years since quitting: 5.3   Smokeless tobacco: Never   Tobacco comments:    3 cigars daily no cigarettes--08/22/2022 khj  Vaping Use   Vaping Use: Never used  Substance and Sexual Activity   Alcohol use: Yes    Comment: occassionally   Drug use: No   Sexual activity: Never  Other Topics Concern   Not on file  Social History Narrative   Not on file   Social Determinants of Health   Financial Resource Strain: Not on file  Food Insecurity: Not on file  Transportation Needs: Not on file  Physical Activity: Not on file  Stress: Not on file  Social Connections: Not on file     Family  History:  The patient's family history includes Heart Problems in her mother; Hypertension in her mother. There is no history of Breast cancer.  ROS:   12-point review of systems is negative unless otherwise noted in the HPI.   EKGs/Labs/Other Studies Reviewed:    Studies reviewed were summarized above. The additional studies were reviewed today:  [facility] 10/08/2022: Conclusions: Severe native three-vessel coronary artery disease, similar to prior catheterization on 12/09/2017. Widely patent LIMA-LAD. Patent SVG-OM2 with 80% stenosis in OM2 just distal to anastomosis.  There is also moderate ectasia of the distal portion of the SVG. Occluded SVG-rPDA, which does not appear acute but is new since last catheterization on 12/09/2017. Normal left heart, right heart, and pulmonary artery pressures. Normal Fick cardiac output/index. Small left radial artery, not well-suited for catheterization; successful left heart catheterization via the left ulnar artery.   Recommendations: Escalate antianginal therapy as tolerated.  We will increase isosorbide mononitrate to 120 mg daily and continue current dose of carvedilol. Aggressive secondary prevention of coronary artery disease; smoking cessation reinforced. __________   Luci Bank patch 09/2022: Pending __________   2D echo 06/10/2022: 1. Left ventricular ejection fraction, by estimation, is 55 to 60%. Left  ventricular ejection fraction by 2D MOD biplane is 56.9 %. The left  ventricle has normal function. The left ventricle has no regional wall  motion abnormalities. Left ventricular  diastolic parameters were normal. The average left ventricular global  longitudinal strain is -19.0 %. The global longitudinal strain is normal.   2. Right ventricular systolic function is normal. The right ventricular  size is normal.   3. The mitral valve is normal in structure. Trivial mitral valve  regurgitation.   4. The aortic valve is tricuspid. Aortic  valve  regurgitation is not  visualized.   5. The inferior vena cava is normal in size with greater than 50%  respiratory variability, suggesting right atrial pressure of 3 mmHg.  __________   Eugenie Birks MPI 01/2021: Normal pharmacologic myocardial perfusion stress test without evidence of significant ischemia or scar. The left ventricular ejection fraction is normal (57%) with normal wall motion. This is a low risk study. Attenuation correction CT demonstrates post CABG findings __________   2D echo 05/2020: 1. Left ventricular ejection fraction, by estimation, is 60 to 65%. The  left ventricle has normal function. The left ventricle has no regional  wall motion abnormalities. There is mild left ventricular hypertrophy.  Left ventricular diastolic parameters  were normal.   2. Right ventricular systolic function is normal. The right ventricular  size is normal. There is mildly elevated pulmonary artery systolic  pressure. The estimated right ventricular systolic pressure is 37.3 mmHg.   3. Mild mitral valve regurgitation. __________   Luci Bank patch 04/2018: The patient was enrolled for 14 days. 20% of the monitoring period yielded diagnostic tracings. The predominant rhythm was sinus with an average rate of 67 bpm (range 49-111 bpm). No significant arrhythmia was observed.   Sinus rhythm without significant arrhythmias.  Study significant limited by short monitoring period (only 20% of monitoring period yielded diagnostic tracings). __________   2D echo 03/2018: - Left ventricle: The cavity size was at the upper limits of    normal. Wall thickness was increased in a pattern of mild LVH.    Systolic function was normal. The estimated ejection fraction was    in the range of 60% to 65%. Wall motion was normal; there were no    regional wall motion abnormalities. Left ventricular diastolic    function parameters were normal.  - Mitral valve: There was mild regurgitation.  - Right ventricle: The  cavity size was normal. Systolic function    was mildly reduced.  - Tricuspid valve: There was mild regurgitation. __________   Luci Bank patch 02/2018: The patient was monitored for 2 days, 21 hours. The predominant rhythm was sinus with an average rate of 66 bpm (range 42-103). Rare PAC's and PVC's were noted. There were no sustained arrhythmias or prolonged pauses.   Sinus rhythm with rare PAC's and PVC's.  Brief monitoring period (<3 days). __________   LHC 11/2017: Dist LM lesion is 30% stenosed. Mid LAD lesion is 95% stenosed. Lat 1st Mrg lesion is 90% stenosed. Prox Cx to Mid Cx lesion is 80% stenosed. 2nd Mrg lesion is 80% stenosed. Prox RCA lesion is 80% stenosed. Mid RCA lesion is 100% stenosed. Dist RCA lesion is 90% stenosed. LIMA graft was visualized by angiography and is normal in caliber. The graft exhibits no disease. SVG graft was visualized by angiography and is normal in caliber. The graft exhibits no disease. SVG graft was visualized by angiography and is very large. The graft exhibits no disease. LV end diastolic pressure is normal.   1. Severe 3 vessel occlusive CAD.     - 95% mid LAD    - 90% first OM. This is a small caliber vessel and was not bypassed.    - 80-85% anastomotic lesion at the insertion of the SVG into OM 2.    - 100% mid RCA 2. Patent LIMA to the LAD 3. Patent SVG to OM 2 4. Patent SVG to PDA 5. Normal LVEDP.   Plan: continue medical therapy. Perhaps the Myoview findings could be explained base  on disease in OM1. While there is an anastomotic lesion at the touch down of the SVG to OM2 there is a large graft to native vessel size differential that makes this unfavorable for PCI. I think it is unlikely that this is the cause of her symptoms.   EKG:  EKG is ordered today.  The EKG ordered today demonstrates NSR, 81 bpm, left atrial enlargement, prior septal infarct, diffuse ST-T changes consistent with prior tracing  Recent Labs: 05/08/2022:  TSH 3.436 09/16/2022: ALT 9 09/30/2022: BUN 10; Creatinine, Ser 1.01; Platelets 417 10/08/2022: Hemoglobin 10.9; Potassium 3.9; Sodium 136  Recent Lipid Panel    Component Value Date/Time   CHOL 114 05/08/2022 1723   CHOL 119 03/20/2021 1556   TRIG 123 05/08/2022 1723   HDL 33 (L) 05/08/2022 1723   HDL 34 (L) 03/20/2021 1556   CHOLHDL 3.5 05/08/2022 1723   VLDL 25 05/08/2022 1723   LDLCALC 56 05/08/2022 1723   LDLCALC 63 03/20/2021 1556   LDLDIRECT 62 03/20/2021 1556    PHYSICAL EXAM:    VS:  BP 138/78   Pulse 84   Ht 5\' 6"  (1.676 m)   Wt 179 lb 6.4 oz (81.4 kg)   SpO2 99%   BMI 28.96 kg/m   BMI: Body mass index is 28.96 kg/m.  Physical Exam Vitals reviewed.  Constitutional:      Appearance: She is well-developed.  HENT:     Head: Normocephalic and atraumatic.  Eyes:     General:        Right eye: No discharge.        Left eye: No discharge.  Neck:     Vascular: No JVD.  Cardiovascular:     Rate and Rhythm: Normal rate and regular rhythm.     Pulses:          Posterior tibial pulses are 2+ on the right side and 2+ on the left side.     Heart sounds: Normal heart sounds, S1 normal and S2 normal. Heart sounds not distant. No midsystolic click and no opening snap. No murmur heard.    No friction rub.  Pulmonary:     Effort: Pulmonary effort is normal. No respiratory distress.     Breath sounds: Decreased breath sounds and wheezing present. No rales.     Comments: Mildly diminished with mild expiratory wheezing bilaterally. Chest:     Chest wall: No tenderness.  Abdominal:     General: There is no distension.  Musculoskeletal:     Cervical back: Normal range of motion.     Right lower leg: No edema.     Left lower leg: No edema.  Skin:    General: Skin is warm and dry.     Nails: There is no clubbing.  Neurological:     Mental Status: She is alert and oriented to person, place, and time.  Psychiatric:        Speech: Speech normal.        Behavior:  Behavior normal.        Thought Content: Thought content normal.        Judgment: Judgment normal.     Wt Readings from Last 3 Encounters:  03/07/23 179 lb 6.4 oz (81.4 kg)  10/31/22 179 lb 6 oz (81.4 kg)  10/08/22 180 lb (81.6 kg)     ASSESSMENT & PLAN:   CAD status post CABG without angina: She is doing well and is without symptoms of angina or cardiac decompensation.  Recent cardiac cath in 09/2022 showed severe native vessel CAD similar to prior cath with widely patent LIMA to LAD, patent SVG to OM 2 with 80% stenosis in OM 2 just distal to the anastomosis moderate ectasia in the distal portion of the SVG as well as an occluded SVG to RPDA, which did not appear to be acute, though was new when compared to cath from 2019.  RHC showed normal left heart, right heart, pulmonary pressures, and cardiac output/index.  Continue aggressive risk factor modification and secondary prevention including complete smoking cessation along with aspirin, atorvastatin, carvedilol, ezetimibe, Imdur, and losartan.  No indication for further ischemic testing at this time.  HTN: Blood pressure is mildly elevated in the office today.  She reports having been recently evaluated by her PCP for systolic blood pressures in the 180s.  It sounds like she was started on a thiazide diuretic, though I am not 100% certain of this.  We will contact her PCPs office for updated medication list.  Recommend she follow-up with PCP as directed for laboratory evaluation following escalation of antihypertensive therapy.  I will titrate carvedilol to 12.5 mg twice daily with recommendation to continue Imdur and losartan.  Low-sodium diet encouraged.  HLD: LDL 56 in 04/2022 with normal AST/ALT in 08/2022.  She remains on atorvastatin 80 mg.  History of syncope: No further episodes.  Outpatient cardiac monitoring has been unrevealing.  Echo with preserved LV systolic function and normal wall motion.  Recent LHC with medical therapy as  outlined above.  Episode of prior syncope felt to be likely in the setting of pain medication.  COPD with ongoing tobacco use and possible interstitial process: Recommend she follow-up with pulmonology.  She indicates that she will schedule an appointment with them today.  Complete smoking cessation is encouraged.   Disposition: F/u with Dr. Okey Dupre or an APP in 6 months.   Medication Adjustments/Labs and Tests Ordered: Current medicines are reviewed at length with the patient today.  Concerns regarding medicines are outlined above. Medication changes, Labs and Tests ordered today are summarized above and listed in the Patient Instructions accessible in Encounters.   Signed, Eula Listen, PA-C 03/07/2023 11:46 AM     [facility] [address] [place], [place] [address] [phone]

## 2023-03-07 ENCOUNTER — Encounter: Payer: Self-pay | Admitting: Physician Assistant

## 2023-03-07 ENCOUNTER — Ambulatory Visit: Payer: 59 | Attending: Physician Assistant | Admitting: Physician Assistant

## 2023-03-07 VITALS — BP 138/78 | HR 84 | Ht 66.0 in | Wt 179.4 lb

## 2023-03-07 DIAGNOSIS — R55 Syncope and collapse: Secondary | ICD-10-CM | POA: Diagnosis not present

## 2023-03-07 DIAGNOSIS — I10 Essential (primary) hypertension: Secondary | ICD-10-CM | POA: Diagnosis not present

## 2023-03-07 DIAGNOSIS — I25118 Atherosclerotic heart disease of native coronary artery with other forms of angina pectoris: Secondary | ICD-10-CM | POA: Diagnosis not present

## 2023-03-07 DIAGNOSIS — E785 Hyperlipidemia, unspecified: Secondary | ICD-10-CM | POA: Diagnosis not present

## 2023-03-07 DIAGNOSIS — Z720 Tobacco use: Secondary | ICD-10-CM

## 2023-03-07 DIAGNOSIS — J449 Chronic obstructive pulmonary disease, unspecified: Secondary | ICD-10-CM

## 2023-03-07 MED ORDER — CARVEDILOL 12.5 MG PO TABS: 12.5000 mg | ORAL_TABLET | Freq: Two times a day (BID) | ORAL | 3 refills | Status: DC

## 2023-03-07 NOTE — Patient Instructions (Signed)
Medication Instructions:  Your physician has recommended you make the following change in your medication:   INCREASE Carvedilol to 12.5 mg twice a day  *If you need a refill on your cardiac medications before your next appointment, please call your pharmacy*   Lab Work: None  If you have labs (blood work) drawn today and your tests are completely normal, you will receive your results only by: MyChart Message (if you have MyChart) OR A paper copy in the mail If you have any lab test that is abnormal or we need to change your treatment, we will call you to review the results.   Testing/Procedures: None   Follow-Up: At [facility], you and your health needs are our priority.  As part of our continuing mission to provide you with exceptional heart care, we have created designated Provider Care Teams.  These Care Teams include your primary Cardiologist (physician) and Advanced Practice Providers (APPs -  Physician Assistants and Nurse Practitioners) who all work together to provide you with the care you need, when you need it.   Your next appointment:   6 month(s)  Provider:   Yvonne Kendall, MD or Eula Listen, PA-C

## 2023-03-18 ENCOUNTER — Ambulatory Visit (INDEPENDENT_AMBULATORY_CARE_PROVIDER_SITE_OTHER): Payer: 59 | Admitting: Pulmonary Disease

## 2023-03-18 ENCOUNTER — Encounter: Payer: Self-pay | Admitting: Pulmonary Disease

## 2023-03-18 ENCOUNTER — Ambulatory Visit
Admission: RE | Admit: 2023-03-18 | Discharge: 2023-03-18 | Disposition: A | Discharge status: Home / Self Care | Payer: 59 | Source: Ambulatory Visit | Attending: Pulmonary Disease | Admitting: Pulmonary Disease

## 2023-03-18 VITALS — BP 122/80 | HR 64 | Temp 97.6°F | Ht 66.0 in | Wt 175.0 lb

## 2023-03-18 DIAGNOSIS — J9801 Acute bronchospasm: Secondary | ICD-10-CM | POA: Diagnosis present

## 2023-03-18 DIAGNOSIS — R0609 Other forms of dyspnea: Secondary | ICD-10-CM

## 2023-03-18 DIAGNOSIS — J4489 Other specified chronic obstructive pulmonary disease: Secondary | ICD-10-CM

## 2023-03-18 DIAGNOSIS — F17200 Nicotine dependence, unspecified, uncomplicated: Secondary | ICD-10-CM

## 2023-03-18 DIAGNOSIS — K219 Gastro-esophageal reflux disease without esophagitis: Secondary | ICD-10-CM | POA: Diagnosis not present

## 2023-03-18 LAB — NITRIC OXIDE: Nitric Oxide: 11

## 2023-03-18 MED ORDER — BENZONATATE 100 MG PO CAPS: 100.0000 mg | ORAL_CAPSULE | Freq: Two times a day (BID) | ORAL | 2 refills | Status: AC | PRN

## 2023-03-18 MED ORDER — TRELEGY ELLIPTA 200-62.5-25 MCG/ACT IN AEPB: 1.0000 | INHALATION_SPRAY | Freq: Every day | RESPIRATORY_TRACT | 0 refills | Status: DC

## 2023-03-18 NOTE — Progress Notes (Signed)
Subjective:    Patient ID: Caitlyn Herrera, female    DOB: [birth year], 62 y.o.   MRN: [id number] Patient Care Team: [facility], [facility] as PCP - General (General Practice) End, Cristal Deer, MD as PCP - Cardiology (Cardiology) Salena Saner, MD as Consulting Physician (Pulmonary Disease)  Chief Complaint  Patient presents with   Follow-up    SOB with exertion. A little wheezing. Dry cough.    HPI Marchesi is a 62 year old current smoker of cigars, past cigarette smoking with a total pack year history who presents for follow-up on the issue of COPD with asthma overlap.  I last saw the patient in October 2023.  At that time we had to resume Trelegy as she had been out of the medication.  She was also having issues with nocturnal awakenings these are related to reflux.  She has previously been referred to GI but did not follow through with this.  She is not consistent with following up as instructed.  At her last visit she was instructed to follow-up in 6 to 8 weeks time and she failed to do so.  She also was following up with lung cancer screening program and has not had a study done since 09 January 2021.  Also failed to show for follow-ups on this.  She states today she is following up because she was seen by cardiology on 10 May and was noted to have some wheezing.  She is still on Trelegy Ellipta 100.  Notes that this medication helps her with her shortness of breath but she still has some shortness of breath on exertion.  She also has significant gastroesophageal reflux symptoms.  When she reflux is doing today she will have bouts of cough afterwards.  Sometimes she refluxes at night and this wakes her up.  She states that she is sleeping with the head of the bed elevated.  She did not as noted above, have evaluation by GI as requested.  Recently she has not had any fevers, chills or sweats.  No nasal congestion or postnasal drip.  He has significant coronary artery  disease status post CABG in the past.  Her most recent visit with cardiology found her to be doing well from her cardiac standpoint and her cardiac symptoms where at baseline.  She has not had any fevers, chills or sweats.  No cough or sputum production.  No hemoptysis.  No lower extremity edema or calf tenderness.  She does not endorse any significant symptomatology today with the exception of her gastroesophageal reflux symptoms.  DATA 01/09/2021 LDCT: Small scattered lung nodules largest in the right lung apex with an equivalent diameter of 3.6 mm.  No suspicious nodules identified.  Lung RADS 2, benign appearance or behavior.  Emphysema and aortic atherosclerosis. 06/10/2022 echocardiogram: LVEF 55 to 60%, normal LV function, normal diastolic parameters.  RV function normal.  RV size normal.  Trivial mitral valve regurgitation. 09/16/2022 chest x-ray PA and lateral: No active cardiopulmonary disease. 10/08/2022 right and left heart cath: Severe native three-vessel coronary disease similar to 2019.  Widely patent LIMA to LAD.  Patent SVG-OM 2 with 80% stenosis in OM 2 distal to the anastomosis.  Moderate ectasia of the distal portion of the SVG.  Noted SVG-rPDA new since 2019 but does not appear acute.  Normal left heart, right heart and pulmonary artery pressures.  Normal Fick cardiac output/index. 10/24/2022 PFTs: FEV1 1.44 L or 52% predicted, FVC 1.76 L or 48% predicted, FEV1/FVC 82%, there  was significant bronchodilator response.  Lung volumes were moderately reduced.  Diffusion capacity moderately reduced but corrects to normal by alveolar volume.  Consistent with obstructive airways disease with reversibility and moderate restriction either due to chest wall or interstitial changes.   Review of Systems A 10 point review of systems was performed and it is as noted above otherwise negative.  Patient Active Problem List   Diagnosis Date Noted   Pulmonary hypertension, unspecified (HCC)  10/08/2022   Fatigue 05/08/2022   Ischemic cardiomyopathy 11/08/2021   Coronary artery disease of native artery of native heart with stable angina pectoris (HCC) 02/22/2019   Chronic cough 05/28/2018   Shortness of breath 03/26/2018   Essential hypertension 03/26/2018   Palpitations 03/26/2018   Chest pressure 12/08/2017   Unstable angina (HCC) 12/08/2017   Angina at rest    Hyperlipidemia LDL goal <70    S/P CABG (coronary artery bypass graft) 11/12/2017   Non-insulin dependent type 2 diabetes mellitus (HCC) 11/10/2017   3-vessel CAD 11/10/2017   HCVD (hypertensive cardiovascular disease) 11/10/2017   UTI (urinary tract infection) 11/10/2017   Pulmonary nodule, right 11/10/2017   Hyperlipidemia associated with type 2 diabetes mellitus (HCC) 11/07/2017   Tobacco abuse    NSTEMI (non-ST elevated myocardial infarction) (HCC) 11/05/2017   Social History   Tobacco Use   Smoking status: Every Day    Packs/day: 1.00    Years: 42.00    Additional pack years: 0.00    Total pack years: 42.00    Types: Cigars, Cigarettes   Smokeless tobacco: Never   Tobacco comments:    2 cigars daily no cigarettes--03/18/2023 khj    Quit smoking cigarettes in 2020  Substance Use Topics   Alcohol use: Yes    Comment: occassionally   No Known Allergies  Current Meds  Medication Sig   albuterol (PROVENTIL HFA;VENTOLIN HFA) 108 (90 Base) MCG/ACT inhaler Inhale 2 puffs into the lungs every 6 (six) hours as needed for wheezing or shortness of breath.   aspirin EC 81 MG tablet Take 1 tablet (81 mg total) by mouth daily.   atorvastatin (LIPITOR) 80 MG tablet TAKE 1 TABLET(80 MG) BY MOUTH DAILY AT 6 PM   azelastine (ASTELIN) 0.1 % nasal spray Place 2 sprays into both nostrils as needed.   benzonatate (TESSALON) 100 MG capsule Take 100 mg by mouth as needed.    carvedilol (COREG) 12.5 MG tablet Take 1 tablet (12.5 mg total) by mouth 2 (two) times daily.   cetirizine (ZYRTEC) 10 MG tablet Take 10 mg by  mouth daily.   citalopram (CELEXA) 40 MG tablet Take 60 mg by mouth daily.   cyclobenzaprine (FLEXERIL) 10 MG tablet as needed.   ezetimibe (ZETIA) 10 MG tablet TAKE 1 TABLET(10 MG) BY MOUTH DAILY   fluticasone (FLONASE) 50 MCG/ACT nasal spray as needed.   Fluticasone-Umeclidin-Vilant (TRELEGY ELLIPTA) 100-62.5-25 MCG/ACT AEPB Inhale 1 puff into the lungs daily.   hydrochlorothiazide (MICROZIDE) 12.5 MG capsule Take 12.5 mg by mouth daily.   HYDROcodone-acetaminophen (NORCO/VICODIN) 5-325 MG tablet Take 1-2 tablets by mouth every 6 (six) hours as needed for moderate pain or severe pain.   ibuprofen (ADVIL) 600 MG tablet Take 1 tablet (600 mg total) by mouth every 6 (six) hours as needed for moderate pain.   isosorbide mononitrate (IMDUR) 60 MG 24 hr tablet Take 60 mg by mouth daily.   lidocaine (XYLOCAINE) 5 % ointment Apply 1 application topically daily as needed.   losartan (COZAAR) 100 MG tablet Take  100 mg by mouth daily.   meloxicam (MOBIC) 15 MG tablet Take 15 mg by mouth daily.   nicotine (NICODERM CQ - DOSED IN MG/24 HOURS) 14 mg/24hr patch Place 1 patch (14 mg total) onto the skin daily.   nitroGLYCERIN (NITROSTAT) 0.4 MG SL tablet Place 1 tablet (0.4 mg total) under the tongue every 5 (five) minutes as needed for chest pain.   omeprazole (PRILOSEC) 40 MG capsule TAKE 1 CAPSULE BY MOUTH EVERY DAY BEFORE BREAKFAST   ondansetron (ZOFRAN) 4 MG tablet Take 1 tablet (4 mg total) by mouth every 8 (eight) hours as needed for nausea or vomiting.   SitaGLIPtin-MetFORMIN HCl (JANUMET XR) 50-1000 MG TB24 Take 1 tablet by mouth daily.   tiZANidine (ZANAFLEX) 2 MG tablet Take 2 mg by mouth 3 (three) times daily.   Immunization History  Administered Date(s) Administered   Influenza-Unspecified 09/30/2019, 12/11/2020, 08/28/2022   Moderna SARS-COV2 Booster Vaccination 12/11/2020   Moderna Sars-Covid-2 Vaccination 04/19/2020, 05/17/2020        Objective:   Physical Exam BP 122/80 (BP  Location: Left Arm, Cuff Size: Normal)   Pulse 64   Temp 97.6 F (36.4 C)   Ht 5\' 6"  (1.676 m)   Wt 175 lb (79.4 kg)   SpO2 100%   BMI 28.25 kg/m   SpO2: 100 % O2 Device: None (Room air)  GENERAL: This woman, no acute distress, fully ambulatory.  No conversational dyspnea HEAD: Normocephalic, atraumatic.  EYES: Pupils equal, round, reactive to light.  No scleral icterus.  MOUTH: Edentulous, wears dentures.  Oral mucosa moist.  No thrush. NECK: Supple. No thyromegaly. Trachea midline. No JVD.  No adenopathy. PULMONARY: Good air entry bilaterally.  Wheezing noted particularly on the left lower lung field. CARDIOVASCULAR: S1 and S2. Regular rate and rhythm.  No rubs, murmurs or gallops heard. ABDOMEN: Obese otherwise benign. MUSCULOSKELETAL: No joint deformity, no clubbing, no edema.  NEUROLOGIC: No focal deficit, no gait disturbance, speech is fluent. SKIN: Intact,warm,dry. PSYCH: Mood and behavior normal.  Lab Results  Component Value Date   NITRICOXIDE 11 08/22/2022      Assessment & Plan:     ICD-10-CM   1. COPD with asthma  J44.89 DG Chest 2 View   Will increase Trelegy to 200 as a trial Patient to let us know how she does with the increased dose May continue using albuterol as needed    2. Bronchospasm  J98.01 DG Chest 2 View    Nitric oxide   No evidence of type II inflammation in the airway Trial of Trelegy 200 as above    3. Dyspnea on exertion  R06.09    Will obtain chest x-ray PA and lateral If study unremarkable reenroll in lung cancer screening    4. Chronic GERD  K21.9    Continue antireflux measures Continue PPI Consider referral to GI    5. Tobacco dependence syndrome  F17.200    Patient counseled with regards to tobacco use cessation Reenroll in lung cancer screening pending chest x-ray     Orders Placed This Encounter  Procedures   DG Chest 2 View   Nitric oxide   Meds ordered this encounter  Medications   Fluticasone-Umeclidin-Vilant  (TRELEGY ELLIPTA) 200-62.5-25 MCG/ACT AEPB    Sig: Inhale 1 puff into the lungs daily.    Dispense:  14 each    Refill:  0   benzonatate (TESSALON) 100 MG capsule    Sig: Take 1 capsule (100 mg total) by mouth 2 (two) times  daily as needed for cough.    Dispense:  20 capsule    Refill:  2   Will see the patient in follow-up in 6 to 8 weeks time she is to contact us prior to that time should any new difficulties arise.  Gailen Shelter, MD Advanced Bronchoscopy PCCM [facility]    *This note was dictated using voice recognition software/Dragon.  Despite best efforts to proofread, errors can occur which can change the meaning. Any transcriptional errors that result from this process are unintentional and may not be fully corrected at the time of dictation.

## 2023-03-18 NOTE — Patient Instructions (Signed)
I am increasing the strength of your Trelegy to 200.  You have received a sample that will last 2 approximately 2 weeks.  Let us know if this works better than the Trelegy 100.  We are sending a prescription for your Caitlyn Herrera (for the cough) to the pharmacy.  Stop smoking cigars.  You were noted to have some wheezing today.  We are going to get a chest x-ray to check on that.  If your chest x-ray looks okay we will reenroll you in the lung cancer screening program.  We will see you in follow-up in 6 to 8 weeks time call sooner should any new problems arise.

## 2023-04-09 ENCOUNTER — Other Ambulatory Visit: Payer: Self-pay | Admitting: Internal Medicine

## 2023-04-29 ENCOUNTER — Ambulatory Visit: Payer: 59 | Admitting: Nurse Practitioner

## 2023-05-06 ENCOUNTER — Other Ambulatory Visit: Payer: Self-pay

## 2023-05-06 ENCOUNTER — Encounter: Payer: Self-pay | Admitting: Nurse Practitioner

## 2023-05-06 DIAGNOSIS — E785 Hyperlipidemia, unspecified: Secondary | ICD-10-CM

## 2023-05-06 MED ORDER — ATORVASTATIN CALCIUM 80 MG PO TABS
80.00 mg | ORAL_TABLET | Freq: Every day | ORAL | 0 refills | Status: AC
Start: 2023-05-06 — End: ?

## 2023-05-07 ENCOUNTER — Telehealth: Payer: Self-pay | Admitting: Pulmonary Disease

## 2023-05-07 ENCOUNTER — Telehealth: Payer: Self-pay | Admitting: Internal Medicine

## 2023-05-07 DIAGNOSIS — E785 Hyperlipidemia, unspecified: Secondary | ICD-10-CM

## 2023-05-07 MED ORDER — EZETIMIBE 10 MG PO TABS: ORAL_TABLET | ORAL | 0 refills | Status: DC

## 2023-05-07 MED ORDER — NITROGLYCERIN 0.4 MG SL SUBL: 0.4000 mg | SUBLINGUAL_TABLET | SUBLINGUAL | 1 refills | Status: DC | PRN

## 2023-05-07 MED ORDER — ATORVASTATIN CALCIUM 80 MG PO TABS
80.0000 mg | ORAL_TABLET | Freq: Every day | ORAL | 0 refills | Status: DC
Start: 2023-05-07 — End: 2023-08-07

## 2023-05-07 NOTE — Telephone Encounter (Signed)
*  STAT* If patient is at the pharmacy, call can be transferred to refill team.   1. Which medications need to be refilled? (please list name of each medication and dose if known)   nitroGLYCERIN (NITROSTAT) 0.4 MG SL tablet  atorvastatin (LIPITOR) 80 MG tablet  ezetimibe (ZETIA) 10 MG tablet   2. Which pharmacy/location (including street and city if local pharmacy) is medication to be sent to?  [facility] - [address], [place] - [address]   3. Do they need a 30 day or 90 day supply?   Caller stated patient still has some of these medications.

## 2023-05-07 NOTE — Telephone Encounter (Signed)
I have notified Dub Mikes at [facility] pharmacy. She will reach out to the patient's PCP.  Nothing further needed.

## 2023-05-07 NOTE — Telephone Encounter (Signed)
You have not prescribed it for her in the past. Are you ok sending in a prescription?

## 2023-05-07 NOTE — Telephone Encounter (Signed)
I think this was prescribed by primary care.  Also it is now available over-the-counter under the name Astepro.

## 2023-05-29 ENCOUNTER — Other Ambulatory Visit: Payer: Self-pay | Admitting: Internal Medicine

## 2023-06-06 ENCOUNTER — Encounter: Payer: Self-pay | Admitting: Pulmonary Disease

## 2023-07-08 ENCOUNTER — Other Ambulatory Visit: Payer: Self-pay | Admitting: Internal Medicine

## 2023-07-08 NOTE — Telephone Encounter (Signed)
Hi,  Could you advise on dosing of Isosorbide mononitrate? The request received is for 60 mg daily, but within Ryan's note on 03/07/2023, I seen for the patient to take 120 mg daily. 120 mg daily is listed on the patient's AVS for the day as well. Thank you so much.

## 2023-07-09 NOTE — Telephone Encounter (Signed)
Received refill request for Imdur 60 mg  Attempted to contact pt to clarify current dose.  Received message stating phone number currently not in service.  Per last office visit on 03/07/23, Caitlyn Herrera noted pt to continue taking current dose Imdur (120 mg) daily.   Will forward to PA

## 2023-07-30 ENCOUNTER — Other Ambulatory Visit: Payer: Self-pay

## 2023-07-30 MED ORDER — NITROGLYCERIN 0.4 MG SL SUBL: SUBLINGUAL_TABLET | SUBLINGUAL | 1 refills | Status: AC

## 2023-08-07 ENCOUNTER — Other Ambulatory Visit: Payer: Self-pay | Admitting: Internal Medicine

## 2023-08-07 DIAGNOSIS — E785 Hyperlipidemia, unspecified: Secondary | ICD-10-CM

## 2023-08-25 ENCOUNTER — Other Ambulatory Visit: Payer: Self-pay | Admitting: Pulmonary Disease

## 2023-08-28 ENCOUNTER — Other Ambulatory Visit: Payer: Self-pay | Admitting: Internal Medicine

## 2023-08-28 DIAGNOSIS — E785 Hyperlipidemia, unspecified: Secondary | ICD-10-CM

## 2023-09-12 ENCOUNTER — Ambulatory Visit: Payer: 59 | Attending: Internal Medicine | Admitting: Internal Medicine

## 2023-10-09 ENCOUNTER — Other Ambulatory Visit: Payer: Self-pay | Admitting: Internal Medicine

## 2023-10-28 ENCOUNTER — Other Ambulatory Visit: Payer: Self-pay | Admitting: Nurse Practitioner

## 2023-10-30 NOTE — Telephone Encounter (Signed)
 This is a Estate agent.

## 2023-10-30 NOTE — Telephone Encounter (Signed)
 Please contact pt for future appointment. Pt overdue for 6 month f/u. Pt requesting refill.

## 2023-11-07 NOTE — Telephone Encounter (Signed)
 Phone number listed is no longer the patient's number

## 2023-12-19 ENCOUNTER — Other Ambulatory Visit: Payer: Self-pay

## 2023-12-19 ENCOUNTER — Emergency Department: Payer: 59

## 2023-12-19 ENCOUNTER — Inpatient Hospital Stay
Admission: EM | Admit: 2023-12-19 | Discharge: 2023-12-21 | DRG: 871 | Disposition: A | Discharge status: Home / Self Care | Payer: 59 | Attending: Internal Medicine | Admitting: Internal Medicine

## 2023-12-19 ENCOUNTER — Encounter: Payer: Self-pay | Admitting: Intensive Care

## 2023-12-19 DIAGNOSIS — Z720 Tobacco use: Secondary | ICD-10-CM | POA: Diagnosis not present

## 2023-12-19 DIAGNOSIS — I10 Essential (primary) hypertension: Secondary | ICD-10-CM | POA: Diagnosis not present

## 2023-12-19 DIAGNOSIS — F17210 Nicotine dependence, cigarettes, uncomplicated: Secondary | ICD-10-CM | POA: Diagnosis present

## 2023-12-19 DIAGNOSIS — Z7982 Long term (current) use of aspirin: Secondary | ICD-10-CM | POA: Diagnosis not present

## 2023-12-19 DIAGNOSIS — Z79899 Other long term (current) drug therapy: Secondary | ICD-10-CM

## 2023-12-19 DIAGNOSIS — R9431 Abnormal electrocardiogram [ECG] [EKG]: Secondary | ICD-10-CM | POA: Diagnosis present

## 2023-12-19 DIAGNOSIS — K529 Noninfective gastroenteritis and colitis, unspecified: Secondary | ICD-10-CM | POA: Diagnosis present

## 2023-12-19 DIAGNOSIS — J189 Pneumonia, unspecified organism: Secondary | ICD-10-CM | POA: Diagnosis present

## 2023-12-19 DIAGNOSIS — Z951 Presence of aortocoronary bypass graft: Secondary | ICD-10-CM | POA: Diagnosis not present

## 2023-12-19 DIAGNOSIS — E876 Hypokalemia: Secondary | ICD-10-CM | POA: Diagnosis present

## 2023-12-19 DIAGNOSIS — E785 Hyperlipidemia, unspecified: Secondary | ICD-10-CM | POA: Diagnosis present

## 2023-12-19 DIAGNOSIS — Z7984 Long term (current) use of oral hypoglycemic drugs: Secondary | ICD-10-CM | POA: Diagnosis not present

## 2023-12-19 DIAGNOSIS — D649 Anemia, unspecified: Secondary | ICD-10-CM | POA: Diagnosis present

## 2023-12-19 DIAGNOSIS — R112 Nausea with vomiting, unspecified: Secondary | ICD-10-CM | POA: Diagnosis present

## 2023-12-19 DIAGNOSIS — I110 Hypertensive heart disease with heart failure: Secondary | ICD-10-CM | POA: Diagnosis present

## 2023-12-19 DIAGNOSIS — A419 Sepsis, unspecified organism: Principal | ICD-10-CM | POA: Diagnosis present

## 2023-12-19 DIAGNOSIS — I2510 Atherosclerotic heart disease of native coronary artery without angina pectoris: Secondary | ICD-10-CM | POA: Diagnosis present

## 2023-12-19 DIAGNOSIS — Z23 Encounter for immunization: Secondary | ICD-10-CM

## 2023-12-19 DIAGNOSIS — I5032 Chronic diastolic (congestive) heart failure: Secondary | ICD-10-CM | POA: Diagnosis present

## 2023-12-19 DIAGNOSIS — F32A Depression, unspecified: Secondary | ICD-10-CM | POA: Diagnosis present

## 2023-12-19 DIAGNOSIS — Z791 Long term (current) use of non-steroidal anti-inflammatories (NSAID): Secondary | ICD-10-CM | POA: Diagnosis not present

## 2023-12-19 DIAGNOSIS — J154 Pneumonia due to other streptococci: Secondary | ICD-10-CM

## 2023-12-19 DIAGNOSIS — Z8249 Family history of ischemic heart disease and other diseases of the circulatory system: Secondary | ICD-10-CM

## 2023-12-19 DIAGNOSIS — Y95 Nosocomial condition: Secondary | ICD-10-CM | POA: Diagnosis present

## 2023-12-19 DIAGNOSIS — E119 Type 2 diabetes mellitus without complications: Secondary | ICD-10-CM | POA: Diagnosis present

## 2023-12-19 DIAGNOSIS — R918 Other nonspecific abnormal finding of lung field: Secondary | ICD-10-CM | POA: Diagnosis present

## 2023-12-19 DIAGNOSIS — Z716 Tobacco abuse counseling: Secondary | ICD-10-CM | POA: Diagnosis not present

## 2023-12-19 DIAGNOSIS — F17290 Nicotine dependence, other tobacco product, uncomplicated: Secondary | ICD-10-CM | POA: Diagnosis present

## 2023-12-19 HISTORY — DX: Heart failure, unspecified: I50.9

## 2023-12-19 HISTORY — DX: Pneumonia due to other streptococci: J15.4

## 2023-12-19 LAB — CBC
HCT: 32.1 % — ABNORMAL LOW (ref 36.0–46.0)
Hemoglobin: 10.7 g/dL — ABNORMAL LOW (ref 12.0–15.0)
MCH: 30.2 pg (ref 26.0–34.0)
MCHC: 33.3 g/dL (ref 30.0–36.0)
MCV: 90.7 fL (ref 80.0–100.0)
Platelets: 405 10*3/uL — ABNORMAL HIGH (ref 150–400)
RBC: 3.54 MIL/uL — ABNORMAL LOW (ref 3.87–5.11)
RDW: 13.8 % (ref 11.5–15.5)
WBC: 21.6 10*3/uL — ABNORMAL HIGH (ref 4.0–10.5)
nRBC: 0 % (ref 0.0–0.2)

## 2023-12-19 LAB — RESP PANEL BY RT-PCR (RSV, FLU A&B, COVID)  RVPGX2
Influenza A by PCR: NEGATIVE
Influenza B by PCR: NEGATIVE
Resp Syncytial Virus by PCR: NEGATIVE
SARS Coronavirus 2 by RT PCR: NEGATIVE

## 2023-12-19 LAB — MAGNESIUM: Magnesium: 1.7 mg/dL (ref 1.7–2.4)

## 2023-12-19 LAB — BASIC METABOLIC PANEL
Anion gap: 12 (ref 5–15)
BUN: 11 mg/dL (ref 8–23)
CO2: 22 mmol/L (ref 22–32)
Calcium: 9.4 mg/dL (ref 8.9–10.3)
Chloride: 100 mmol/L (ref 98–111)
Creatinine, Ser: 0.95 mg/dL (ref 0.44–1.00)
GFR, Estimated: >60 mL/min (ref >60)
Glucose, Bld: 148 mg/dL — ABNORMAL HIGH (ref 70–99)
Potassium: 3.4 mmol/L — ABNORMAL LOW (ref 3.5–5.1)
Sodium: 134 mmol/L — ABNORMAL LOW (ref 135–145)

## 2023-12-19 LAB — TROPONIN I (HIGH SENSITIVITY): Troponin I (High Sensitivity): 8 ng/L (ref <18)

## 2023-12-19 LAB — HEPATIC FUNCTION PANEL
ALT: 11 U/L (ref 0–44)
AST: 16 U/L (ref 15–41)
Albumin: 3.3 g/dL — ABNORMAL LOW (ref 3.5–5.0)
Alkaline Phosphatase: 62 U/L (ref 38–126)
Bilirubin, Direct: 0.3 mg/dL — ABNORMAL HIGH (ref 0.0–0.2)
Indirect Bilirubin: 1.3 mg/dL — ABNORMAL HIGH (ref 0.3–0.9)
Total Bilirubin: 1.6 mg/dL — ABNORMAL HIGH (ref 0.0–1.2)
Total Protein: 7.5 g/dL (ref 6.5–8.1)

## 2023-12-19 LAB — PHOSPHORUS: Phosphorus: 1.4 mg/dL — ABNORMAL LOW (ref 2.5–4.6)

## 2023-12-19 LAB — GLUCOSE, CAPILLARY: Glucose-Capillary: 110 mg/dL — ABNORMAL HIGH (ref 70–99)

## 2023-12-19 LAB — BRAIN NATRIURETIC PEPTIDE: B Natriuretic Peptide: 132.1 pg/mL — ABNORMAL HIGH (ref 0.0–100.0)

## 2023-12-19 LAB — LIPASE, BLOOD: Lipase: 29 U/L (ref 11–51)

## 2023-12-19 LAB — LACTIC ACID, PLASMA: Lactic Acid, Venous: 1.6 mmol/L (ref 0.5–1.9)

## 2023-12-19 MED ORDER — SODIUM CHLORIDE 0.9 % IV SOLN
2.0000 g | Freq: Once | INTRAVENOUS | Status: AC
  Administered 2023-12-19: 2 g via INTRAVENOUS
  Filled 2023-12-19: qty 20

## 2023-12-19 MED ORDER — ALBUTEROL SULFATE (2.5 MG/3ML) 0.083% IN NEBU: 2.5000 mg | INHALATION_SOLUTION | RESPIRATORY_TRACT | Status: DC | PRN

## 2023-12-19 MED ORDER — MAGNESIUM SULFATE IN D5W 1-5 GM/100ML-% IV SOLN
1.0000 g | Freq: Once | INTRAVENOUS | Status: AC
  Administered 2023-12-20: 1 g via INTRAVENOUS
  Filled 2023-12-19 (×2): qty 100

## 2023-12-19 MED ORDER — HYDRALAZINE HCL 20 MG/ML IJ SOLN: 5.0000 mg | INTRAMUSCULAR | Status: DC | PRN

## 2023-12-19 MED ORDER — ACETAMINOPHEN 325 MG PO TABS
650.0000 mg | ORAL_TABLET | Freq: Four times a day (QID) | ORAL | Status: DC | PRN
  Administered 2023-12-20: 650 mg via ORAL
  Filled 2023-12-19: qty 2

## 2023-12-19 MED ORDER — ALBUTEROL SULFATE HFA 108 (90 BASE) MCG/ACT IN AERS: 2.0000 | INHALATION_SPRAY | RESPIRATORY_TRACT | Status: DC | PRN

## 2023-12-19 MED ORDER — NICOTINE 21 MG/24HR TD PT24
21.0000 mg | MEDICATED_PATCH | Freq: Every day | TRANSDERMAL | Status: DC
  Administered 2023-12-20 – 2023-12-21 (×2): 21 mg via TRANSDERMAL
  Filled 2023-12-19 (×2): qty 1

## 2023-12-19 MED ORDER — CEFTRIAXONE SODIUM 2 G IJ SOLR
2.0000 g | INTRAMUSCULAR | Status: DC
  Administered 2023-12-20: 2 g via INTRAVENOUS
  Filled 2023-12-19 (×2): qty 20

## 2023-12-19 MED ORDER — IOHEXOL 350 MG/ML SOLN
100.0000 mL | Freq: Once | INTRAVENOUS | Status: AC | PRN
  Administered 2023-12-19: 100 mL via INTRAVENOUS

## 2023-12-19 MED ORDER — DM-GUAIFENESIN ER 30-600 MG PO TB12: 1.0000 | ORAL_TABLET | Freq: Two times a day (BID) | ORAL | Status: DC | PRN

## 2023-12-19 MED ORDER — SODIUM CHLORIDE 0.9 % IV SOLN
500.0000 mg | Freq: Once | INTRAVENOUS | Status: AC
  Administered 2023-12-19: 500 mg via INTRAVENOUS
  Filled 2023-12-19: qty 5

## 2023-12-19 MED ORDER — SODIUM CHLORIDE 0.9 % IV BOLUS
1000.0000 mL | Freq: Once | INTRAVENOUS | Status: AC
  Administered 2023-12-19: 1000 mL via INTRAVENOUS

## 2023-12-19 MED ORDER — KETOROLAC TROMETHAMINE 15 MG/ML IJ SOLN
15.0000 mg | Freq: Once | INTRAMUSCULAR | Status: AC
  Administered 2023-12-19: 15 mg via INTRAVENOUS
  Filled 2023-12-19: qty 1

## 2023-12-19 MED ORDER — SODIUM CHLORIDE 0.9 % IV SOLN
100.0000 mg | Freq: Two times a day (BID) | INTRAVENOUS | Status: DC
  Administered 2023-12-20: 100 mg via INTRAVENOUS
  Filled 2023-12-19 (×3): qty 100

## 2023-12-19 MED ORDER — POTASSIUM CHLORIDE CRYS ER 20 MEQ PO TBCR
40.0000 meq | EXTENDED_RELEASE_TABLET | Freq: Once | ORAL | Status: AC
  Administered 2023-12-20: 40 meq via ORAL
  Filled 2023-12-19: qty 2

## 2023-12-19 MED ORDER — ENSURE ENLIVE PO LIQD
237.0000 mL | Freq: Two times a day (BID) | ORAL | Status: DC
  Administered 2023-12-20 – 2023-12-21 (×3): 237 mL via ORAL

## 2023-12-19 MED ORDER — ACETAMINOPHEN 325 MG PO TABS
650.0000 mg | ORAL_TABLET | Freq: Once | ORAL | Status: AC | PRN
  Administered 2023-12-19: 650 mg via ORAL
  Filled 2023-12-19: qty 2

## 2023-12-19 MED ORDER — IPRATROPIUM-ALBUTEROL 0.5-2.5 (3) MG/3ML IN SOLN
3.0000 mL | Freq: Four times a day (QID) | RESPIRATORY_TRACT | Status: DC
Start: 2023-12-20 — End: 2023-12-20
  Administered 2023-12-20 (×2): 3 mL via RESPIRATORY_TRACT
  Filled 2023-12-19 (×2): qty 3

## 2023-12-19 MED ORDER — INFLUENZA VAC A&B SURF ANT ADJ 0.5 ML IM SUSY
0.5000 mL | PREFILLED_SYRINGE | INTRAMUSCULAR | Status: AC
  Administered 2023-12-21: 0.5 mL via INTRAMUSCULAR
  Filled 2023-12-19: qty 0.5

## 2023-12-19 MED ORDER — DIPHENHYDRAMINE HCL 50 MG/ML IJ SOLN
12.5000 mg | Freq: Three times a day (TID) | INTRAMUSCULAR | Status: DC | PRN
  Administered 2023-12-20: 12.5 mg via INTRAVENOUS
  Filled 2023-12-19: qty 1

## 2023-12-19 MED ORDER — INSULIN ASPART 100 UNIT/ML IJ SOLN
0.0000 [IU] | Freq: Three times a day (TID) | INTRAMUSCULAR | Status: DC
  Administered 2023-12-20 – 2023-12-21 (×2): 1 [IU] via SUBCUTANEOUS
  Filled 2023-12-19: qty 1

## 2023-12-19 MED ORDER — HEPARIN SODIUM (PORCINE) 5000 UNIT/ML IJ SOLN
5000.0000 [IU] | Freq: Three times a day (TID) | INTRAMUSCULAR | Status: DC
  Administered 2023-12-20 – 2023-12-21 (×4): 5000 [IU] via SUBCUTANEOUS
  Filled 2023-12-19 (×4): qty 1

## 2023-12-19 MED ORDER — INSULIN ASPART 100 UNIT/ML IJ SOLN: 0.0000 [IU] | Freq: Every day | INTRAMUSCULAR | Status: DC

## 2023-12-19 NOTE — H&P (Addendum)
 History and Physical    Caitlyn Herrera:[id number] DOB: 09-04-1961 DOA: 12/19/2023  Referring MD/NP/PA:   PCP: [name]   Patient coming from:  The patient is coming from home.     Chief Complaint: Cough, SOB, chest pain  HPI: Caitlyn Herrera is a 63 y.o. female with medical history significant of hypertension, hyperlipidemia, diabetes mellitus, CAD, CABG, diastolic CHF, GERD, depression, carotid artery stenosis, renal artery stenosis, smoker, who presents with cough, SOB, chest pain.  Patient states that she has SOB for almost 3 weeks, which has worsened in the past several days.  Patient has cough with brownish colored sputum production.  She also has chest pain, which is located in front chest, burning-like and also pressure-like pain, constant, moderate, nonradiating.  Her chest pain is pleuritic, aggravated by deep breath and coughing.  She states that since last night she developed fever, chills, nausea, few episodes of nonbilious nonbloody vomiting and several episodes of watery diarrhea.  No abdominal pain.  No symptoms of UTI.  Data reviewed independently and ED Course: pt was found to have WBC 21.6, lactic acid 1.6, troponin 8, negative PCR for COVID, flu and RSV, potassium 3.4, magnesium 1.7, phosphorus 1.4, GFR> 60.  Temperature 102, soft blood pressure 96/52, heart rate of 102, RR 21, oxygen saturation 99% on room air.  Chest x-ray showed left lower lobe infiltration.  CTA and CT abdomen/pelvis: CHEST:  1. No evidence acute pulmonary embolism. 2. LEFT infrahilar masslike fullness surrounding the LEFT lower lobe bronchus. Consolidation in the medial LEFT lower lobe. Differential includes lobar pneumonia versus bronchogenic carcinoma. Elevated white blood cell count favors pneumonia however narrowing of the LEFT lower lobe bronchus is concerning for malignancy. Potential superimposed pneumonia on bronchogenic carcinoma. 3. Scattered small  nodules and consolidation in the LEFT upper lobe are favored pulmonary infection. 4. Post CABG.  PELVIS: 1. No evidence of metastatic disease in the abdomen pelvis. 2. No acute findings in the abdomen pelvis.    EKG: I have personally reviewed.  Sinus rhythm rhythm, QTc 562, mild T wave inversion in V1-V2, V4-V6   Review of Systems:   General: no fevers, chills, no body weight gain, has poor appetite, has fatigue HEENT: no blurry vision, hearing changes or sore throat Respiratory: has dyspnea, coughing, no wheezing CV: no chest pain, no palpitations GI: has nausea, vomiting,  diarrhea, no constipation, abdominal pain, GU: no dysuria, burning on urination, increased urinary frequency, hematuria  Ext: no leg edema Neuro: no unilateral weakness, numbness, or tingling, no vision change or hearing loss Skin: no rash, no skin tear. MSK: No muscle spasm, no deformity, no limitation of range of movement in spin Heme: No easy bruising.  Travel history: No recent long distant travel.   Allergy: No Known Allergies  Past Medical History:  Diagnosis Date   Abdominal aortic ectasia (HCC)    a. 11/2017 CTA chest/abd/pelvis: 2.5 cm abd ao ectasia -->rec f/u u/s in 5 yrs.   CAD (coronary artery disease)    a. 11/04/17 Cath: Native multivessel dzs-->CABG x 3 (LIMA->LAD, VG->OM2, VG->RPDA; b. 11/2017 MV: mid antlat/apical isch; c. 11/2017 Cath: LM 30d, LAD 66m, LCX 80p/m, OM1 90, OM2 80 (@ anastamosis of graft), RCA 80p, 130m, 90d, LIMA->LAD nl, VG->OM2 nl, VG->RPDA nl-->Med Rx.   Carotid arterial disease (HCC)    a. 10/2017 Carotid U/S: 1-30% bilat ICA stenosis.   CHF (congestive heart failure) (HCC)    History of echocardiogram    a. 11/04/2017  Echo: EF of 65-70%, no RWMA, nl LV diastolic fxn, nl RV size/fxn, mild TR; b. 03/2018 Echo: EF 60-65%, mild LVH, no rwma, mild MR. Mildy reduced RV fxn. Mild TR.   History of Tobacco abuse    Hyperlipidemia    Hypertension    Left renal artery stenosis (HCC)     a. 11/2017 CTA Chest/Abd/Pelvis: 50-70% L RA stenosis.   Palpitations    a. 02/2018 Event Monitor: wore for 2 days, 21 hrs - rare PAC's/PVC's. Avg HR 66 (42-103); b. 04/2018 Event Monitor: Wore for 14 days. RSR, 67 (49-111). No significant arrhythmias.   Persistent cough    Pulmonary nodule, right    a. 10/2017 CT Chest: 5mm pulm nodule in R lung apex - rec f/u w/ non-contrast chest CT in 1 year.   Type II diabetes mellitus (HCC)    a. 10/2017 A1c 6.6.    Past Surgical History:  Procedure Laterality Date   BREAST BIOPSY Right 06/23/2018   US guided biopsy - awaiting pathology   BREAST CYST EXCISION Right    CARDIAC CATHETERIZATION     CORONARY ARTERY BYPASS GRAFT N/A 11/12/2017   Procedure: CORONARY ARTERY BYPASS GRAFTING (CABG) x Three , using left internal mammary artery and right leg greater saphenous vein harvested endoscopically;  Surgeon: Kerin Perna, MD;  Location: [facility] OR;  Service: Open Heart Surgery;  Laterality: N/A;   DILATION AND CURETTAGE OF UTERUS     LAPAROSCOPIC CHOLECYSTECTOMY     LEFT HEART CATH AND CORONARY ANGIOGRAPHY N/A 11/04/2017   Procedure: LEFT HEART CATH AND CORONARY ANGIOGRAPHY;  Surgeon: Yvonne Kendall, MD;  Location: [facility] INVASIVE CV LAB;  Service: Cardiovascular;  Laterality: N/A;   LEFT HEART CATH AND CORS/GRAFTS ANGIOGRAPHY N/A 12/09/2017   Procedure: LEFT HEART CATH AND CORS/GRAFTS ANGIOGRAPHY;  Surgeon: [name], Peter M, MD;  Location: [facility] INVASIVE CV LAB;  Service: Cardiovascular;  Laterality: N/A;   RIGHT/LEFT HEART CATH AND CORONARY ANGIOGRAPHY Bilateral 10/08/2022   Procedure: RIGHT/LEFT HEART CATH AND CORONARY ANGIOGRAPHY;  Surgeon: Yvonne Kendall, MD;  Location: [facility] INVASIVE CV LAB;  Service: Cardiovascular;  Laterality: Bilateral;   TEE WITHOUT CARDIOVERSION N/A 11/12/2017   Procedure: TRANSESOPHAGEAL ECHOCARDIOGRAM (TEE);  Surgeon: Donata Clay, Theron Arista, MD;  Location: [facility] OR;  Service: Open Heart Surgery;  Laterality: N/A;    Social History:   reports that she has been smoking cigars and cigarettes. She has a 42 pack-year smoking history. She has never used smokeless tobacco. She reports that she does not currently use alcohol. She reports that she does not use drugs.  Family History:  Family History  Problem Relation Age of Onset   Hypertension Mother    Heart Problems Mother    Breast cancer Neg Hx      Prior to Admission medications   Medication Sig Start Date End Date Taking? Authorizing Provider  albuterol (PROVENTIL HFA;VENTOLIN HFA) 108 (90 Base) MCG/ACT inhaler Inhale 2 puffs into the lungs every 6 (six) hours as needed for wheezing or shortness of breath. 03/12/18   Lovett Sox, MD  aspirin EC 81 MG tablet Take 1 tablet (81 mg total) by mouth daily. 03/26/18   End, Cristal Deer, MD  atorvastatin (LIPITOR) 80 MG tablet TAKE 1 TABLET (80 MG TOTAL) BY MOUTH DAILY. 08/29/23   End, Cristal Deer, MD  azelastine (ASTELIN) 0.1 % nasal spray Place 2 sprays into both nostrils as needed. 11/05/18   [provider]  benzonatate (TESSALON) 100 MG capsule Take 1 capsule (100 mg total) by mouth 2 (  two) times daily as needed for cough. 03/18/23   Salena Saner, MD  carvedilol (COREG) 12.5 MG tablet Take 1 tablet (12.5 mg total) by mouth 2 (two) times daily. 03/07/23 06/05/23  Sondra Barges, PA-C  cetirizine (ZYRTEC) 10 MG tablet Take 10 mg by mouth daily.    [provider]  chlorpheniramine-HYDROcodone (TUSSIONEX PENNKINETIC ER) 10-8 MG/5ML SUER Take 5 mLs by mouth 2 (two) times daily. Patient not taking: Reported on 03/18/2023 05/02/20   Irean Hong, MD  citalopram (CELEXA) 40 MG tablet Take 60 mg by mouth daily. 12/16/22   [provider]  cyclobenzaprine (FLEXERIL) 10 MG tablet as needed. 11/03/18   [provider]  dextromethorphan-guaiFENesin (ROBITUSSIN-DM) 10-100 MG/5ML liquid as needed. Patient not taking: Reported on 03/18/2023    [provider]  ezetimibe (ZETIA) 10 MG tablet TAKE 1  TABLET(10 MG) BY MOUTH DAILY 08/29/23   End, Cristal Deer, MD  fluticasone (FLONASE) 50 MCG/ACT nasal spray as needed. 01/06/19   [provider]  Fluticasone-Umeclidin-Vilant (TRELEGY ELLIPTA) 200-62.5-25 MCG/ACT AEPB Inhale 1 puff into the lungs daily. 03/18/23   Salena Saner, MD  gabapentin (NEURONTIN) 300 MG capsule Take 300 mg by mouth daily. Has not started. Patient not taking: Reported on 03/07/2023 03/03/23   [provider]  hydrochlorothiazide (MICROZIDE) 12.5 MG capsule Take 12.5 mg by mouth daily. 03/04/23   [provider]  ibuprofen (ADVIL) 600 MG tablet Take 1 tablet (600 mg total) by mouth every 6 (six) hours as needed for moderate pain. 09/16/22   Shaune Pollack, MD  isosorbide mononitrate (IMDUR) 120 MG 24 hr tablet TAKE 1 TABLET BY MOUTH EVERY MORNING 10/09/23   End, Cristal Deer, MD  isosorbide mononitrate (IMDUR) 60 MG 24 hr tablet TAKE 1 TABLET(60 MG) BY MOUTH DAILY 07/10/23   Sondra Barges, PA-C  lidocaine (XYLOCAINE) 5 % ointment Apply 1 application topically daily as needed.    [provider]  losartan (COZAAR) 100 MG tablet Take 100 mg by mouth daily.    [provider]  meloxicam (MOBIC) 15 MG tablet Take 15 mg by mouth daily. 10/02/22   [provider]  nicotine (NICODERM CQ - DOSED IN MG/24 HOURS) 14 mg/24hr patch Place 1 patch (14 mg total) onto the skin daily. 10/31/22   Dunn, Raymon Mutton, PA-C  nitroGLYCERIN (NITROSTAT) 0.4 MG SL tablet DISSOLVE 1 TABLET UNDER THE TONGUE AS NEEDED FOR CHEST PAIN EVERY 5 MINUTES UP TO 3 TIMES. IF NO RELIEF CALL 911. 07/30/23   Creig Hines, NP  omeprazole (PRILOSEC) 40 MG capsule TAKE 1 CAPSULE BY MOUTH EVERY DAY BEFORE BREAKFAST 03/29/19   Vanga, Loel Dubonnet, MD  ondansetron (ZOFRAN) 4 MG tablet Take 1 tablet (4 mg total) by mouth every 8 (eight) hours as needed for nausea or vomiting. 11/28/17   Lovett Sox, MD  SitaGLIPtin-MetFORMIN HCl (JANUMET XR) 50-1000 MG TB24 Take 1 tablet  by mouth daily.    [provider]  tiZANidine (ZANAFLEX) 2 MG tablet Take 2 mg by mouth 3 (three) times daily. 10/02/22   [provider]    Physical Exam: Vitals:   12/19/23 2130 12/19/23 2230 12/19/23 2300 12/19/23 2343  BP: 105/60 (!) 123/55 125/69 (!) 139/57  Pulse: 64 62 (!) 57 63  Resp: 20 17 17 20   Temp:    97.7 F (36.5 C)  TempSrc:      SpO2: 99% 98% 99% 100%  Weight:      Height:  General: Not in acute distress HEENT:       Eyes: PERRL, EOMI, no jaundice       ENT: No discharge from the ears and nose, no pharynx injection, no tonsillar enlargement.        Neck: No JVD, no bruit, no mass felt. Heme: No neck lymph node enlargement. Cardiac: S1/S2, RRR, No murmurs, No gallops or rubs. Respiratory: No rales, wheezing, rhonchi or rubs. GI: Soft, nondistended, nontender, no rebound pain, no organomegaly, BS present. GU: No hematuria Ext: No pitting leg edema bilaterally. 1+DP/PT pulse bilaterally. Musculoskeletal: No joint deformities, No joint redness or warmth, no limitation of ROM in spin. Skin: No rashes.  Neuro: Alert, oriented X3, cranial nerves II-XII grossly intact, moves all extremities normally.  Psych: Patient is not psychotic, no suicidal or hemocidal ideation.  Labs on Admission: I have personally reviewed following labs and imaging studies  CBC: Recent Labs  Lab 12/19/23 1710  WBC 21.6*  HGB 10.7*  HCT 32.1*  MCV 90.7  PLT 405*   Basic Metabolic Panel: Recent Labs  Lab 12/19/23 1710  NA 134*  K 3.4*  CL 100  CO2 22  GLUCOSE 148*  BUN 11  CREATININE 0.95  CALCIUM 9.4  MG 1.7  PHOS 1.4*   GFR: Estimated Creatinine Clearance: 57.4 mL/min (by C-G formula based on SCr of 0.95 mg/dL). Liver Function Tests: Recent Labs  Lab 12/19/23 1710  AST 16  ALT 11  ALKPHOS 62  BILITOT 1.6*  PROT 7.5  ALBUMIN 3.3*   Recent Labs  Lab 12/19/23 1710  LIPASE 29   No results for input(s): "AMMONIA" in the last 168  hours. Coagulation Profile: Recent Labs  Lab 12/19/23 2348  INR 1.4*   Cardiac Enzymes: No results for input(s): "CKTOTAL", "CKMB", "CKMBINDEX", "TROPONINI" in the last 168 hours. BNP (last 3 results) No results for input(s): "PROBNP" in the last 8760 hours. HbA1C: No results for input(s): "HGBA1C" in the last 72 hours. CBG: Recent Labs  Lab 12/19/23 2340  GLUCAP 110*   Lipid Profile: No results for input(s): "CHOL", "HDL", "LDLCALC", "TRIG", "CHOLHDL", "LDLDIRECT" in the last 72 hours. Thyroid Function Tests: No results for input(s): "TSH", "T4TOTAL", "FREET4", "T3FREE", "THYROIDAB" in the last 72 hours. Anemia Panel: No results for input(s): "VITAMINB12", "FOLATE", "FERRITIN", "TIBC", "IRON", "RETICCTPCT" in the last 72 hours. Urine analysis:    Component Value Date/Time   COLORURINE YELLOW 11/07/2017 1129   APPEARANCEUR HAZY (A) 11/07/2017 1129   LABSPEC 1.038 (H) 11/07/2017 1129   PHURINE 5.0 11/07/2017 1129   GLUCOSEU NEGATIVE 11/07/2017 1129   HGBUR SMALL (A) 11/07/2017 1129   BILIRUBINUR NEGATIVE 11/07/2017 1129   KETONESUR NEGATIVE 11/07/2017 1129   PROTEINUR NEGATIVE 11/07/2017 1129   NITRITE POSITIVE (A) 11/07/2017 1129   LEUKOCYTESUR SMALL (A) 11/07/2017 1129   Sepsis Labs: @LABRCNTIP (procalcitonin:4,lacticidven:4) ) Recent Results (from the past 240 hours)  Resp panel by RT-PCR (RSV, Flu A&B, Covid) Anterior Nasal Swab     Status: None   Collection Time: 12/19/23  5:10 PM   Specimen: Anterior Nasal Swab  Result Value Ref Range Status   SARS Coronavirus 2 by RT PCR NEGATIVE NEGATIVE Final    Comment: (NOTE) SARS-CoV-2 target nucleic acids are NOT DETECTED.  The SARS-CoV-2 RNA is generally detectable in upper respiratory specimens during the acute phase of infection. The lowest concentration of SARS-CoV-2 viral copies this assay can detect is 138 copies/mL. A negative result does not preclude SARS-Cov-2 infection and should not be used as the  sole  basis for treatment or other patient management decisions. A negative result may occur with  improper specimen collection/handling, submission of specimen other than nasopharyngeal swab, presence of viral mutation(s) within the areas targeted by this assay, and inadequate number of viral copies(<138 copies/mL). A negative result must be combined with clinical observations, patient history, and epidemiological information. The expected result is Negative.  Fact Sheet for Patients:  BloggerCourse.com  Fact Sheet for Healthcare Providers:  SeriousBroker.it  This test is no t yet approved or cleared by the [place] FDA and  has been authorized for detection and/or diagnosis of SARS-CoV-2 by FDA under an Emergency Use Authorization (EUA). This EUA will remain  in effect (meaning this test can be used) for the duration of the COVID-19 declaration under Section 564(b)(1) of the Act, 21 U.S.C.section 360bbb-3(b)(1), unless the authorization is terminated  or revoked sooner.       Influenza A by PCR NEGATIVE NEGATIVE Final   Influenza B by PCR NEGATIVE NEGATIVE Final    Comment: (NOTE) The Xpert Xpress SARS-CoV-2/FLU/RSV plus assay is intended as an aid in the diagnosis of influenza from Nasopharyngeal swab specimens and should not be used as a sole basis for treatment. Nasal washings and aspirates are unacceptable for Xpert Xpress SARS-CoV-2/FLU/RSV testing.  Fact Sheet for Patients: BloggerCourse.com  Fact Sheet for Healthcare Providers: SeriousBroker.it  This test is not yet approved or cleared by the [place] FDA and has been authorized for detection and/or diagnosis of SARS-CoV-2 by FDA under an Emergency Use Authorization (EUA). This EUA will remain in effect (meaning this test can be used) for the duration of the COVID-19 declaration under Section 564(b)(1) of the Act,  21 U.S.C. section 360bbb-3(b)(1), unless the authorization is terminated or revoked.     Resp Syncytial Virus by PCR NEGATIVE NEGATIVE Final    Comment: (NOTE) Fact Sheet for Patients: BloggerCourse.com  Fact Sheet for Healthcare Providers: SeriousBroker.it  This test is not yet approved or cleared by the [place] FDA and has been authorized for detection and/or diagnosis of SARS-CoV-2 by FDA under an Emergency Use Authorization (EUA). This EUA will remain in effect (meaning this test can be used) for the duration of the COVID-19 declaration under Section 564(b)(1) of the Act, 21 U.S.C. section 360bbb-3(b)(1), unless the authorization is terminated or revoked.  Performed at [facility], [address], [place], [place] [address]      Radiological Exams on Admission:   Assessment/Plan Principal Problem:   CAP (community acquired pneumonia) Active Problems:   Sepsis (HCC)   Lung mass_mass like   CAD (coronary artery disease)   Essential hypertension   Hyperlipidemia LDL goal <70   Chronic diastolic CHF (congestive heart failure) (HCC)   Hypokalemia   Hypophosphatemia   Nausea vomiting and diarrhea   Tobacco abuse   Diabetes mellitus without complication (HCC)   Assessment and Plan:  Sepsis due to CAP (community acquired pneumonia): Patient meets criteria for sepsis with WBC 25.6, fever of 102, heart rate 102, RR 21.  Lactic acid is normal 1.6.  No oxygen desaturation. CTA negative for PE, but showed left infrahilar masslike fullness surrounding the left  lower lobe bronchus and consolidation in the medial left  lower lobe. We can not completely rule out the possibility of malignancy and postobstructive pneumonia.  - Will admit to tele bed as inpt - IV Rocephin and doxycycline (patient received 1 dose of azithromycin which was not continued due to prolonged QTc interval) - Incentive  spirometry - Mucinex  for cough  - Bronchodilators - Urine legionella and S. pneumococcal antigen - Follow up blood culture x2, sputum culture - IVF: 1.5 L of NS bolus  Lung mass_mass like findings by CTA: Cannot completely rule out the possibility of malignancy.  Patient is a smoker. I discussed with the patient and emphasized importance of following up with PCP and pulmonologist after pneumonia is treated.  I told patient that after pneumonia is treated, she will need to repeat CT scan of her chest.  If the mass is persistently present, she will need to see pulmonologist and have biopsy to rule out lung cancer.  Patient verbally understood and promised to do so.  Hx of CAD (coronary artery disease): s/p of CABG. patient has a pleuritic chest pain, which is likely due to pneumonia.  Troponin negative. -Aspirin, Lipitor  Essential hypertension: Blood pressure is soft -IV hydralazine as needed -Hold all blood pressure medications: HCTZ, Coreg, Imdur, Cozaar due to softer blood pressure and sepsis.  Hyperlipidemia LDL goal <70 -Lipitor  Chronic diastolic CHF (congestive heart failure) (HCC): 2D echo on 06/10/2022 showed EF of 55-60%.  Patient does not have leg edema JVD.  CHF seem to be compensated. -Check BNP  Hypokalemia and Hypophosphatemia: Potassium 3.4, phosphorus 7.4, magnesium 1.7. -Repleted potassium and phosphorus -Also give 1 g of magnesium sulfate  Nausea vomiting and diarrhea: -IV fluid as above -As needed Zofran -Follow-up C. difficile and GI pathogen panel  Tobacco abuse -Did counseling about importance of quitting smoking -Nicotine patch  Diabetes mellitus without complication: Recent A1c 6.8, well-controlled.  Patient  is taking Janumet -SSI    DVT ppx: SQ Heparin   Code Status: Full code    Family Communication:     not done, no family member is at bed side.      Disposition Plan:  Anticipate discharge back to previous environment  Consults called:  none  Admission status  and Level of care: Telemetry Medical: as inpt        Dispo: The patient is from: Home              Anticipated d/c is to: Home              Anticipated d/c date is: 2 days              Patient currently is not medically stable to d/c.    Severity of Illness:  The appropriate patient status for this patient is INPATIENT. Inpatient status is judged to be reasonable and necessary in order to provide the required intensity of service to ensure the patient's safety. The patient's presenting symptoms, physical exam findings, and initial radiographic and laboratory data in the context of their chronic comorbidities is felt to place them at high risk for further clinical deterioration. Furthermore, it is not anticipated that the patient will be medically stable for discharge from the hospital within 2 midnights of admission.   * I certify that at the point of admission it is my clinical judgment that the patient will require inpatient hospital care spanning beyond 2 midnights from the point of admission due to high intensity of service, high risk for further deterioration and high frequency of surveillance required.*       Date of Service 12/20/2023    Lorretta Harp [facility]   If 7PM-7AM, please contact night-coverage [email or web address] 12/20/2023, 1:05 AM

## 2023-12-19 NOTE — Sepsis Progress Note (Signed)
 Sepsis protocol monitored by eLink ?

## 2023-12-19 NOTE — Progress Notes (Signed)
CODE SEPSIS - PHARMACY COMMUNICATION  **Broad Spectrum Antibiotics should be administered within 1 hour of Sepsis diagnosis**  Time Code Sepsis Called/Page Received: 1814  Antibiotics Ordered: Azithro, ceftriaxone  Time of 1st antibiotic administration: 1856  Additional action taken by pharmacy: N/A  If necessary, Name of Provider/Nurse Contacted: N/A    Merryl Hacker ,PharmD Clinical Pharmacist  12/19/2023  6:26 PM

## 2023-12-19 NOTE — ED Provider Notes (Signed)
[facility] Provider Note    Event Date/Time   First MD Initiated Contact with Patient 12/19/23 1751     (approximate)   History   Chest Pain   HPI  Caitlyn Herrera is a 63 year old female presenting to the emergency department for evaluation of chest pain, fever, cough.  Patient reports she has had ongoing chest discomfort for several weeks.  Last night had onset of fever with cough as well as vomiting and diarrhea.  Also reports shortness of breath that is worsened leading her to present to the ER.     Physical Exam   Triage Vital Signs: ED Triage Vitals  Encounter Vitals Group     BP 12/19/23 1709 135/76     Systolic BP Percentile --      Diastolic BP Percentile --      Pulse Rate 12/19/23 1709 (!) 102     Resp 12/19/23 1709 18     Temp 12/19/23 1709 (!) 102 F (38.9 C)     Temp Source 12/19/23 1709 Oral     SpO2 12/19/23 1709 95 %     Weight 12/19/23 1706 157 lb (71.2 kg)     Height 12/19/23 1706 5\' 7"  (1.702 m)     Head Circumference --      Peak Flow --      Pain Score 12/19/23 1706 8     Pain Loc --      Pain Education --      Exclude from Growth Chart --     Most recent vital signs: Vitals:   12/19/23 2230 12/19/23 2300  BP: (!) 123/55 125/69  Pulse: 62 (!) 57  Resp: 17 17  Temp:    SpO2: 98% 99%     General: Awake, interactive  CV:  Regular rate, good peripheral perfusion.  Resp:  Unlabored respirations, lungs clear to auscultation Abd:  Nondistended. Neuro:  Symmetric facial movement, fluid speech   ED Results / Procedures / Treatments   Labs (all labs ordered are listed, but only abnormal results are displayed) Labs Reviewed  BASIC METABOLIC PANEL - Abnormal; Notable for the following components:      Result Value   Sodium 134 (*)    Potassium 3.4 (*)    Glucose, Bld 148 (*)    All other components within normal limits  CBC - Abnormal; Notable for the following components:   WBC 21.6 (*)    RBC 3.54 (*)     Hemoglobin 10.7 (*)    HCT 32.1 (*)    Platelets 405 (*)    All other components within normal limits  HEPATIC FUNCTION PANEL - Abnormal; Notable for the following components:   Albumin 3.3 (*)    Total Bilirubin 1.6 (*)    Bilirubin, Direct 0.3 (*)    Indirect Bilirubin 1.3 (*)    All other components within normal limits  PHOSPHORUS - Abnormal; Notable for the following components:   Phosphorus 1.4 (*)    All other components within normal limits  RESP PANEL BY RT-PCR (RSV, FLU A&B, COVID)  RVPGX2  CULTURE, BLOOD (ROUTINE X 2)  CULTURE, BLOOD (ROUTINE X 2)  EXPECTORATED SPUTUM ASSESSMENT W GRAM STAIN, RFLX TO RESP C  LIPASE, BLOOD  LACTIC ACID, PLASMA  MAGNESIUM  URINALYSIS, W/ REFLEX TO CULTURE (INFECTION SUSPECTED)  BRAIN NATRIURETIC PEPTIDE  PROTIME-INR  APTT  HIV ANTIBODY (ROUTINE TESTING W REFLEX)  LEGIONELLA PNEUMOPHILA SEROGP 1 UR AG  STREP PNEUMONIAE URINARY ANTIGEN  BASIC METABOLIC PANEL  CBC  TROPONIN I (HIGH SENSITIVITY)     EKG EKG independently reviewed interpreted by myself (ER attending) demonstrates:  Initial EKG demonstrates normal sinus rhythm at a rate of 99, PR 140, QRS 70, QTc 562 Repeat EKG at 1847 demonstrates normal sinus rhythm, PR 130, QRS 80, normalized QTc at 459  RADIOLOGY Imaging independently reviewed and interpreted by myself demonstrates:  CXR with left lower lobe airspace disease concerning for pneumonia CTA chest without evidence of PE, does demonstrate left masslike area concerning for pneumonia though malignancy a consideration CT abdomen pelvis without acute findings  PROCEDURES:  Critical Care performed: Yes, see critical care procedure note(s)  CRITICAL CARE Performed by: Trinna Post   Total critical care time: 32 minutes  Critical care time was exclusive of separately billable procedures and treating other patients.  Critical care was necessary to treat or prevent imminent or life-threatening deterioration.  Critical  care was time spent personally by me on the following activities: development of treatment plan with patient and/or surrogate as well as nursing, discussions with consultants, evaluation of patient's response to treatment, examination of patient, obtaining history from patient or surrogate, ordering and performing treatments and interventions, ordering and review of laboratory studies, ordering and review of radiographic studies, pulse oximetry and re-evaluation of patient's condition.   Procedures   MEDICATIONS ORDERED IN ED: Medications  ipratropium-albuterol (DUONEB) 0.5-2.5 (3) MG/3ML nebulizer solution 3 mL (has no administration in time range)  dextromethorphan-guaiFENesin (MUCINEX DM) 30-600 MG per 12 hr tablet 1 tablet (has no administration in time range)  potassium chloride SA (KLOR-CON M) CR tablet 40 mEq (has no administration in time range)  magnesium sulfate IVPB 1 g 100 mL (has no administration in time range)  nicotine (NICODERM CQ - dosed in mg/24 hours) patch 21 mg (has no administration in time range)  diphenhydrAMINE (BENADRYL) injection 12.5 mg (has no administration in time range)  hydrALAZINE (APRESOLINE) injection 5 mg (has no administration in time range)  acetaminophen (TYLENOL) tablet 650 mg (has no administration in time range)  insulin aspart (novoLOG) injection 0-9 Units (has no administration in time range)  insulin aspart (novoLOG) injection 0-5 Units (has no administration in time range)  cefTRIAXone (ROCEPHIN) 2 g in sodium chloride 0.9 % 100 mL IVPB (has no administration in time range)  doxycycline (VIBRAMYCIN) 100 mg in sodium chloride 0.9 % 250 mL IVPB (has no administration in time range)  heparin injection 5,000 Units (has no administration in time range)  albuterol (PROVENTIL) (2.5 MG/3ML) 0.083% nebulizer solution 2.5 mg (has no administration in time range)  acetaminophen (TYLENOL) tablet 650 mg (650 mg Oral Given 12/19/23 1714)  cefTRIAXone (ROCEPHIN)  2 g in sodium chloride 0.9 % 100 mL IVPB (0 g Intravenous Stopped 12/19/23 1916)  azithromycin (ZITHROMAX) 500 mg in sodium chloride 0.9 % 250 mL IVPB (0 mg Intravenous Stopped 12/19/23 2103)  iohexol (OMNIPAQUE) 350 MG/ML injection 100 mL (100 mLs Intravenous Contrast Given 12/19/23 1918)  ketorolac (TORADOL) 15 MG/ML injection 15 mg (15 mg Intravenous Given 12/19/23 1939)  sodium chloride 0.9 % bolus 1,000 mL (1,000 mLs Intravenous New Bag/Given 12/19/23 2302)     IMPRESSION / MDM / ASSESSMENT AND PLAN / ED COURSE  I reviewed the triage vital signs and the nursing notes.  Differential diagnosis includes, but is not limited to, pneumonia, pneumothorax, ACS, viral illness  Patient's presentation is most consistent with acute presentation with potential threat to life or bodily function.  63 year old female presenting  to the emergency department for evaluation of fever and chest pain.  Febrile and tachycardic on presentation.  Sepsis orders initiated with empiric Rocephin and azithromycin.  Labs with significant leukocytosis with WBC of 21.6.  With multiple symptoms, CTA of the chest and CT abdomen pelvis obtained which did demonstrate masslike area.  With clinical history concerning for pneumonia, the radiology notes that malignancy cannot be excluded.  Patient reevaluated and updated on the results of her workup.  Did discuss admission for suspected pneumonia meeting sepsis criteria.  Patient comfortable with this plan.  Will reach out to hospitalist team.  Case reviewed with Dr. Clyde Lundborg.  He will evaluate the patient for anticipated admission.      FINAL CLINICAL IMPRESSION(S) / ED DIAGNOSES   Final diagnoses:  Pneumonia due to infectious organism, unspecified laterality, unspecified part of lung     Rx / DC Orders   ED Discharge Orders     None        Note:  This document was prepared using Dragon voice recognition software and may include unintentional dictation errors.   Trinna Post, MD 12/19/23 [phone]

## 2023-12-19 NOTE — ED Triage Notes (Signed)
Patient c/o burning/chest discomfort X3 weeks. Also reports the last few days having emesis, fever, diarrhea, and congestion

## 2023-12-20 DIAGNOSIS — E119 Type 2 diabetes mellitus without complications: Secondary | ICD-10-CM

## 2023-12-20 DIAGNOSIS — J189 Pneumonia, unspecified organism: Secondary | ICD-10-CM | POA: Diagnosis not present

## 2023-12-20 LAB — GASTROINTESTINAL PANEL BY PCR, STOOL (REPLACES STOOL CULTURE)

## 2023-12-20 LAB — URINALYSIS, W/ REFLEX TO CULTURE (INFECTION SUSPECTED)
Bacteria, UA: NONE SEEN
Bilirubin Urine: NEGATIVE
Glucose, UA: NEGATIVE mg/dL
Hgb urine dipstick: NEGATIVE
Ketones, ur: NEGATIVE mg/dL
Leukocytes,Ua: NEGATIVE
Nitrite: NEGATIVE
Protein, ur: NEGATIVE mg/dL
Specific Gravity, Urine: 1.04 — ABNORMAL HIGH (ref 1.005–1.030)
pH: 5 (ref 5.0–8.0)

## 2023-12-20 LAB — GLUCOSE, CAPILLARY
Glucose-Capillary: 111 mg/dL — ABNORMAL HIGH (ref 70–99)
Glucose-Capillary: 131 mg/dL — ABNORMAL HIGH (ref 70–99)
Glucose-Capillary: 82 mg/dL (ref 70–99)
Glucose-Capillary: 90 mg/dL (ref 70–99)

## 2023-12-20 LAB — C DIFFICILE QUICK SCREEN W PCR REFLEX
C Diff antigen: NEGATIVE
C Diff interpretation: NOT DETECTED
C Diff toxin: NEGATIVE

## 2023-12-20 LAB — BASIC METABOLIC PANEL
Anion gap: 5 (ref 5–15)
BUN: 11 mg/dL (ref 8–23)
CO2: 25 mmol/L (ref 22–32)
Calcium: 8.4 mg/dL — ABNORMAL LOW (ref 8.9–10.3)
Chloride: 109 mmol/L (ref 98–111)
Creatinine, Ser: 0.76 mg/dL (ref 0.44–1.00)
GFR, Estimated: >60 mL/min (ref >60)
Glucose, Bld: 106 mg/dL — ABNORMAL HIGH (ref 70–99)
Potassium: 4 mmol/L (ref 3.5–5.1)
Sodium: 139 mmol/L (ref 135–145)

## 2023-12-20 LAB — CBC
HCT: 26.7 % — ABNORMAL LOW (ref 36.0–46.0)
Hemoglobin: 8.8 g/dL — ABNORMAL LOW (ref 12.0–15.0)
MCH: 30.1 pg (ref 26.0–34.0)
MCHC: 33 g/dL (ref 30.0–36.0)
MCV: 91.4 fL (ref 80.0–100.0)
Platelets: 365 10*3/uL (ref 150–400)
RBC: 2.92 MIL/uL — ABNORMAL LOW (ref 3.87–5.11)
RDW: 14.1 % (ref 11.5–15.5)
WBC: 22.8 10*3/uL — ABNORMAL HIGH (ref 4.0–10.5)
nRBC: 0 % (ref 0.0–0.2)

## 2023-12-20 LAB — HIV ANTIBODY (ROUTINE TESTING W REFLEX): HIV Screen 4th Generation wRfx: NONREACTIVE

## 2023-12-20 LAB — PROTIME-INR
INR: 1.4 — ABNORMAL HIGH (ref 0.8–1.2)
Prothrombin Time: 16.9 s — ABNORMAL HIGH (ref 11.4–15.2)

## 2023-12-20 LAB — PHOSPHORUS: Phosphorus: 3.1 mg/dL (ref 2.5–4.6)

## 2023-12-20 LAB — STREP PNEUMONIAE URINARY ANTIGEN: Strep Pneumo Urinary Antigen: POSITIVE — AB

## 2023-12-20 LAB — APTT: aPTT: 37 s — ABNORMAL HIGH (ref 24–36)

## 2023-12-20 MED ORDER — OXYCODONE-ACETAMINOPHEN 5-325 MG PO TABS
1.0000 | ORAL_TABLET | ORAL | Status: DC | PRN
  Administered 2023-12-20 (×2): 1 via ORAL
  Filled 2023-12-20 (×2): qty 1

## 2023-12-20 MED ORDER — SODIUM CHLORIDE 0.9 % IV SOLN
500.0000 mg | INTRAVENOUS | Status: AC
  Administered 2023-12-20 – 2023-12-21 (×2): 500 mg via INTRAVENOUS
  Filled 2023-12-20 (×2): qty 5

## 2023-12-20 MED ORDER — GABAPENTIN 300 MG PO CAPS
300.0000 mg | ORAL_CAPSULE | Freq: Every day | ORAL | Status: DC
  Administered 2023-12-20: 300 mg via ORAL
  Filled 2023-12-20: qty 1

## 2023-12-20 MED ORDER — SODIUM CHLORIDE 0.9 % IV BOLUS
500.0000 mL | Freq: Once | INTRAVENOUS | Status: AC
  Administered 2023-12-20: 500 mL via INTRAVENOUS

## 2023-12-20 MED ORDER — ATORVASTATIN CALCIUM 20 MG PO TABS
80.0000 mg | ORAL_TABLET | Freq: Every day | ORAL | Status: DC
  Administered 2023-12-20 – 2023-12-21 (×2): 80 mg via ORAL
  Filled 2023-12-20 (×2): qty 4

## 2023-12-20 MED ORDER — IPRATROPIUM-ALBUTEROL 0.5-2.5 (3) MG/3ML IN SOLN: 3.0000 mL | Freq: Two times a day (BID) | RESPIRATORY_TRACT | Status: DC

## 2023-12-20 MED ORDER — POTASSIUM PHOSPHATES 15 MMOLE/5ML IV SOLN
30.0000 mmol | Freq: Once | INTRAVENOUS | Status: AC
  Administered 2023-12-20: 30 mmol via INTRAVENOUS
  Filled 2023-12-20: qty 10

## 2023-12-20 MED ORDER — PANTOPRAZOLE SODIUM 40 MG PO TBEC
40.0000 mg | DELAYED_RELEASE_TABLET | Freq: Every day | ORAL | Status: DC
  Administered 2023-12-20 – 2023-12-21 (×2): 40 mg via ORAL
  Filled 2023-12-20 (×2): qty 1

## 2023-12-20 MED ORDER — ASPIRIN 81 MG PO TBEC
81.0000 mg | DELAYED_RELEASE_TABLET | Freq: Every day | ORAL | Status: DC
  Administered 2023-12-20 – 2023-12-21 (×2): 81 mg via ORAL
  Filled 2023-12-20 (×2): qty 1

## 2023-12-20 MED ORDER — EZETIMIBE 10 MG PO TABS
10.0000 mg | ORAL_TABLET | Freq: Every day | ORAL | Status: DC
  Administered 2023-12-20 – 2023-12-21 (×2): 10 mg via ORAL
  Filled 2023-12-20 (×2): qty 1

## 2023-12-20 MED ORDER — CITALOPRAM HYDROBROMIDE 20 MG PO TABS
60.0000 mg | ORAL_TABLET | Freq: Every day | ORAL | Status: DC
  Administered 2023-12-20 – 2023-12-21 (×2): 60 mg via ORAL
  Filled 2023-12-20 (×2): qty 3

## 2023-12-20 MED ORDER — IPRATROPIUM-ALBUTEROL 0.5-2.5 (3) MG/3ML IN SOLN
RESPIRATORY_TRACT | Status: AC
Start: 2023-12-20 — End: 2023-12-20
  Administered 2023-12-20: 3 mL via RESPIRATORY_TRACT
  Filled 2023-12-20: qty 3

## 2023-12-20 NOTE — Plan of Care (Signed)

## 2023-12-20 NOTE — Progress Notes (Signed)
 PROGRESS NOTE    Caitlyn Herrera  ZOX:[id number] DOB: 1961/06/27 DOA: 12/19/2023 PCP: [name]   Assessment & Plan:   Principal Problem:   CAP (community acquired pneumonia) Active Problems:   Sepsis (HCC)   Lung mass_mass like   CAD (coronary artery disease)   Essential hypertension   Hyperlipidemia LDL goal <70   Chronic diastolic CHF (congestive heart failure) (HCC)   Hypokalemia   Hypophosphatemia   Nausea vomiting and diarrhea   Tobacco abuse   Diabetes mellitus without complication (HCC)  Assessment and Plan: Sepsis: met criteria w/ leukocytosis, fever, tachycardia, tachypnea & CAP. Continue on IV rocephin, azithromycin, bronchodilators & encourage incentive spirometry. S/p 1 L NS bolus.   CAP: continue on IV rocephin, azithromycin, bronchodilators & encourage incentive spirometry. Legionella, strep ordered but not collected yet, will discuss w/ nurse   Lung mass: & cannot completely rule out the possibility of malignancy as per CTA chest.  Need to have repeat CT chest outpatient to assess potential lung mass. Pt verbalized her understanding    Hx of CAD: s/p of CABG. Continue on statin, aspirin. Holding coreg    HTN: holding coreg, imdur, losartan, hydrochlorothiazide secondary to low normal BP   HLD: continue on statin   Chronic diastolic CHF:  echo on 06/10/2022 showed EF of 55-60%.  Patient does not have leg edema JVD.  CHF appears compensated. Monitor I/Os   Hypokalemia: WNL today   Hypophosphatemia: WNL today    Likely gastroenteritis: w/ nausea, vomiting and diarrhea. GI PCR panel & c. diff ordered but not collected yet.    Tobacco abuse: received smoking cessation counseling already. Nicotine patch to prevent w/drawl   DM2: pretty well controlled, HbA1c 6.8. Continue on SSI w/ accuchecks   Normocytic anemia: no need for a transfusion currently    DVT prophylaxis: heparin  Code Status: full  Family  Communication: Disposition Plan: likely d/c back home   Level of care: Telemetry Medical  Status is: Inpatient Remains inpatient appropriate because: severity of illness   Consultants:    Procedures:   Antimicrobials:  Subjective: Pt c/o fatigue   Objective: Vitals:   12/20/23 0256 12/20/23 0431 12/20/23 0728 12/20/23 0753  BP:  (!) 112/57  (!) 118/58  Pulse:  (!) 54  (!) 58  Resp:  16  18  Temp:  97.9 F (36.6 C)  98 F (36.7 C)  TempSrc:      SpO2: 98% 100% 98% 100%  Weight:      Height:        Intake/Output Summary (Last 24 hours) at 12/20/2023 0819 Last data filed at 12/19/2023 2103 Gross per 24 hour  Intake 350 ml  Output --  Net 350 ml   Filed Weights   12/19/23 1706  Weight: 71.2 kg    Examination:  General exam: Appears calm and comfortable  Respiratory system: decreased breath sounds b/l Cardiovascular system: S1 & S2+. No  rubs, gallops or clicks.  Gastrointestinal system: Abdomen is nondistended, soft and nontender. Normal bowel sounds heard. Central nervous system: Alert and oriented.Moves all extremities  Psychiatry: Judgement and insight appear normal. Mood & affect appropriate.     Data Reviewed: I have personally reviewed following labs and imaging studies  CBC: Recent Labs  Lab 12/19/23 1710 12/20/23 0515  WBC 21.6* 22.8*  HGB 10.7* 8.8*  HCT 32.1* 26.7*  MCV 90.7 91.4  PLT 405* 365   Basic Metabolic Panel: Recent Labs  Lab 12/19/23 1710 12/20/23 0515  NA 134* 139  K 3.4* 4.0  CL 100 109  CO2 22 25  GLUCOSE 148* 106*  BUN 11 11  CREATININE 0.95 0.76  CALCIUM 9.4 8.4*  MG 1.7  --   PHOS 1.4* 3.1   GFR: Estimated Creatinine Clearance: 68.2 mL/min (by C-G formula based on SCr of 0.76 mg/dL). Liver Function Tests: Recent Labs  Lab 12/19/23 1710  AST 16  ALT 11  ALKPHOS 62  BILITOT 1.6*  PROT 7.5  ALBUMIN 3.3*   Recent Labs  Lab 12/19/23 1710  LIPASE 29   No results for input(s): "AMMONIA" in the last  168 hours. Coagulation Profile: Recent Labs  Lab 12/19/23 2348  INR 1.4*   Cardiac Enzymes: No results for input(s): "CKTOTAL", "CKMB", "CKMBINDEX", "TROPONINI" in the last 168 hours. BNP (last 3 results) No results for input(s): "PROBNP" in the last 8760 hours. HbA1C: No results for input(s): "HGBA1C" in the last 72 hours. CBG: Recent Labs  Lab 12/19/23 2340 12/20/23 0754  GLUCAP 110* 111*   Lipid Profile: No results for input(s): "CHOL", "HDL", "LDLCALC", "TRIG", "CHOLHDL", "LDLDIRECT" in the last 72 hours. Thyroid Function Tests: No results for input(s): "TSH", "T4TOTAL", "FREET4", "T3FREE", "THYROIDAB" in the last 72 hours. Anemia Panel: No results for input(s): "VITAMINB12", "FOLATE", "FERRITIN", "TIBC", "IRON", "RETICCTPCT" in the last 72 hours. Sepsis Labs: Recent Labs  Lab 12/19/23 1847  LATICACIDVEN 1.6    Recent Results (from the past 240 hours)  Resp panel by RT-PCR (RSV, Flu A&B, Covid) Anterior Nasal Swab     Status: None   Collection Time: 12/19/23  5:10 PM   Specimen: Anterior Nasal Swab  Result Value Ref Range Status   SARS Coronavirus 2 by RT PCR NEGATIVE NEGATIVE Final    Comment: (NOTE) SARS-CoV-2 target nucleic acids are NOT DETECTED.  The SARS-CoV-2 RNA is generally detectable in upper respiratory specimens during the acute phase of infection. The lowest concentration of SARS-CoV-2 viral copies this assay can detect is 138 copies/mL. A negative result does not preclude SARS-Cov-2 infection and should not be used as the sole basis for treatment or other patient management decisions. A negative result may occur with  improper specimen collection/handling, submission of specimen other than nasopharyngeal swab, presence of viral mutation(s) within the areas targeted by this assay, and inadequate number of viral copies(<138 copies/mL). A negative result must be combined with clinical observations, patient history, and epidemiological information.  The expected result is Negative.  Fact Sheet for Patients:  BloggerCourse.com  Fact Sheet for Healthcare Providers:  SeriousBroker.it  This test is no t yet approved or cleared by the [place] FDA and  has been authorized for detection and/or diagnosis of SARS-CoV-2 by FDA under an Emergency Use Authorization (EUA). This EUA will remain  in effect (meaning this test can be used) for the duration of the COVID-19 declaration under Section 564(b)(1) of the Act, 21 U.S.C.section 360bbb-3(b)(1), unless the authorization is terminated  or revoked sooner.       Influenza A by PCR NEGATIVE NEGATIVE Final   Influenza B by PCR NEGATIVE NEGATIVE Final    Comment: (NOTE) The Xpert Xpress SARS-CoV-2/FLU/RSV plus assay is intended as an aid in the diagnosis of influenza from Nasopharyngeal swab specimens and should not be used as a sole basis for treatment. Nasal washings and aspirates are unacceptable for Xpert Xpress SARS-CoV-2/FLU/RSV testing.  Fact Sheet for Patients: BloggerCourse.com  Fact Sheet for Healthcare Providers: SeriousBroker.it  This test is not yet approved or cleared by the  [place] [employer] and has been authorized for detection and/or diagnosis of SARS-CoV-2 by FDA under an TEFL teacher (EUA). This EUA will remain in effect (meaning this test can be used) for the duration of the COVID-19 declaration under Section 564(b)(1) of the Act, 21 U.S.C. section 360bbb-3(b)(1), unless the authorization is terminated or revoked.     Resp Syncytial Virus by PCR NEGATIVE NEGATIVE Final    Comment: (NOTE) Fact Sheet for Patients: BloggerCourse.com  Fact Sheet for Healthcare Providers: SeriousBroker.it  This test is not yet approved or cleared by the [place] FDA and has been authorized for detection and/or  diagnosis of SARS-CoV-2 by FDA under an Emergency Use Authorization (EUA). This EUA will remain in effect (meaning this test can be used) for the duration of the COVID-19 declaration under Section 564(b)(1) of the Act, 21 U.S.C. section 360bbb-3(b)(1), unless the authorization is terminated or revoked.  Performed at [facility], [address] [place], [place] [address]   Blood Culture (routine x 2)     Status: None (Preliminary result)   Collection Time: 12/19/23  6:47 PM   Specimen: BLOOD LEFT ARM  Result Value Ref Range Status   Specimen Description BLOOD LEFT ARM  Final   Special Requests   Final    BOTTLES DRAWN AEROBIC AND ANAEROBIC Blood Culture adequate volume   Culture   Final    NO GROWTH < 12 HOURS Performed at [facility], [address], [place], [place] [address]    Report Status PENDING  Incomplete  Blood Culture (routine x 2)     Status: None (Preliminary result)   Collection Time: 12/19/23  6:47 PM   Specimen: BLOOD RIGHT ARM  Result Value Ref Range Status   Specimen Description BLOOD RIGHT ARM  Final   Special Requests   Final    BOTTLES DRAWN AEROBIC AND ANAEROBIC Blood Culture results may not be optimal due to an inadequate volume of blood received in culture bottles   Culture   Final    NO GROWTH < 12 HOURS Performed at [facility], [address], [place], [place] [address]    Report Status PENDING  Incomplete         Radiology Studies: CT Angio Chest PE W and/or Wo Contrast Result Date: 12/19/2023 CLINICAL DATA:  Burning chest pain. Emesis. Fever. * Tracking Code: BO * EXAM: CT ANGIOGRAPHY CHEST CT ABDOMEN AND PELVIS WITH CONTRAST TECHNIQUE: Multidetector CT imaging of the chest was performed using the standard protocol during bolus administration of intravenous contrast. Multiplanar CT image reconstructions and MIPs were obtained to evaluate the vascular anatomy. Multidetector CT imaging of the abdomen and pelvis was  performed using the standard protocol during bolus administration of intravenous contrast. RADIATION DOSE REDUCTION: This exam was performed according to the departmental dose-optimization program which includes automated exposure control, adjustment of the mA and/or kV according to patient size and/or use of iterative reconstruction technique. CONTRAST:  OMNIPAQUE IOHEXOL 350 MG/ML SOLN COMPARISON:  Lung cancer screening CT 01/09/2021 FINDINGS: CTA CHEST FINDINGS Cardiovascular: No filling defects within the pulmonary arteries to suggest acute pulmonary embolism. Post CABG Mediastinum/Nodes: LEFT infrahilar masslike fullness measures 3.8 x 3.2 cm (image 72/series 2). This masslike infrahilar fullness surrounds the LEFT lower lobe lobe bronchus. There is collapse of the LEFT lobe bronchus and postobstructive pneumonitis in the LEFT lower lobe. Lungs/Pleura: Scattered small nodule LEFT upper lobe airspace pattern. Small focus consolidation medial LEFT upper lobe measures 10 mm image  71. RIGHT lung is clear. Musculoskeletal: No aggressive osseous lesion. Review of the MIP images confirms the above findings. CT ABDOMEN and PELVIS FINDINGS Hepatobiliary: No focal hepatic lesion. Postcholecystectomy. No biliary dilatation. Pancreas: Pancreas is normal. No ductal dilatation. No pancreatic inflammation. Spleen: Normal spleen Adrenals/urinary tract: Adrenal glands and kidneys are normal. The ureters and bladder normal. Stomach/Bowel: Stomach, small bowel, appendix, and cecum are normal. The colon and rectosigmoid colon are normal. Vascular/Lymphatic: Abdominal aorta is normal caliber. No periportal or retroperitoneal adenopathy. No pelvic adenopathy. Reproductive: Uterus and adnexa unremarkable. Other: No free fluid. Musculoskeletal: No aggressive osseous lesion. Review of the MIP images confirms the above findings. IMPRESSION: CHEST: 1. No evidence acute pulmonary embolism. 2. LEFT infrahilar masslike fullness  surrounding the LEFT lower lobe bronchus. Consolidation in the medial LEFT lower lobe. Differential includes lobar pneumonia versus bronchogenic carcinoma. Elevated white blood cell count favors pneumonia however narrowing of the LEFT lower lobe bronchus is concerning for malignancy. Potential superimposed pneumonia on bronchogenic carcinoma. 3. Scattered small nodules and consolidation in the LEFT upper lobe are favored pulmonary infection. 4. Post CABG. PELVIS: 1. No evidence of metastatic disease in the abdomen pelvis. 2. No acute findings in the abdomen pelvis. Electronically Signed   By: Genevive Bi M.D.   On: 12/19/2023 20:14   CT ABDOMEN PELVIS W CONTRAST Result Date: 12/19/2023 CLINICAL DATA:  Burning chest pain. Emesis. Fever. * Tracking Code: BO * EXAM: CT ANGIOGRAPHY CHEST CT ABDOMEN AND PELVIS WITH CONTRAST TECHNIQUE: Multidetector CT imaging of the chest was performed using the standard protocol during bolus administration of intravenous contrast. Multiplanar CT image reconstructions and MIPs were obtained to evaluate the vascular anatomy. Multidetector CT imaging of the abdomen and pelvis was performed using the standard protocol during bolus administration of intravenous contrast. RADIATION DOSE REDUCTION: This exam was performed according to the departmental dose-optimization program which includes automated exposure control, adjustment of the mA and/or kV according to patient size and/or use of iterative reconstruction technique. CONTRAST:  OMNIPAQUE IOHEXOL 350 MG/ML SOLN COMPARISON:  Lung cancer screening CT 01/09/2021 FINDINGS: CTA CHEST FINDINGS Cardiovascular: No filling defects within the pulmonary arteries to suggest acute pulmonary embolism. Post CABG Mediastinum/Nodes: LEFT infrahilar masslike fullness measures 3.8 x 3.2 cm (image 72/series 2). This masslike infrahilar fullness surrounds the LEFT lower lobe lobe bronchus. There is collapse of the LEFT lobe bronchus and  postobstructive pneumonitis in the LEFT lower lobe. Lungs/Pleura: Scattered small nodule LEFT upper lobe airspace pattern. Small focus consolidation medial LEFT upper lobe measures 10 mm image 71. RIGHT lung is clear. Musculoskeletal: No aggressive osseous lesion. Review of the MIP images confirms the above findings. CT ABDOMEN and PELVIS FINDINGS Hepatobiliary: No focal hepatic lesion. Postcholecystectomy. No biliary dilatation. Pancreas: Pancreas is normal. No ductal dilatation. No pancreatic inflammation. Spleen: Normal spleen Adrenals/urinary tract: Adrenal glands and kidneys are normal. The ureters and bladder normal. Stomach/Bowel: Stomach, small bowel, appendix, and cecum are normal. The colon and rectosigmoid colon are normal. Vascular/Lymphatic: Abdominal aorta is normal caliber. No periportal or retroperitoneal adenopathy. No pelvic adenopathy. Reproductive: Uterus and adnexa unremarkable. Other: No free fluid. Musculoskeletal: No aggressive osseous lesion. Review of the MIP images confirms the above findings. IMPRESSION: CHEST: 1. No evidence acute pulmonary embolism. 2. LEFT infrahilar masslike fullness surrounding the LEFT lower lobe bronchus. Consolidation in the medial LEFT lower lobe. Differential includes lobar pneumonia versus bronchogenic carcinoma. Elevated white blood cell count favors pneumonia however narrowing of the LEFT lower lobe bronchus is concerning for malignancy.  Potential superimposed pneumonia on bronchogenic carcinoma. 3. Scattered small nodules and consolidation in the LEFT upper lobe are favored pulmonary infection. 4. Post CABG. PELVIS: 1. No evidence of metastatic disease in the abdomen pelvis. 2. No acute findings in the abdomen pelvis. Electronically Signed   By: Genevive Bi M.D.   On: 12/19/2023 20:14   DG Chest 2 View Result Date: 12/19/2023 CLINICAL DATA:  Fever and chest pain. EXAM: CHEST - 2 VIEW COMPARISON:  03/18/2023 FINDINGS: The heart size and mediastinal  contours are within normal limits. Prior CABG again noted. New left lower lobe airspace disease is seen, consistent with pneumonia. No evidence of pleural effusion. IMPRESSION: New left lower lobe airspace disease, consistent with pneumonia. Recommend continued chest radiographic follow-up to confirm resolution. Electronically Signed   By: Danae Orleans M.D.   On: 12/19/2023 18:15        Scheduled Meds:  aspirin EC  81 mg Oral Daily   atorvastatin  80 mg Oral Daily   citalopram  60 mg Oral Daily   ezetimibe  10 mg Oral Daily   feeding supplement  237 mL Oral BID BM   gabapentin  300 mg Oral QHS   heparin  5,000 Units Subcutaneous Q8H   influenza vaccine adjuvanted  0.5 mL Intramuscular Tomorrow-1000   insulin aspart  0-5 Units Subcutaneous QHS   insulin aspart  0-9 Units Subcutaneous TID WC   ipratropium-albuterol  3 mL Nebulization Q6H   nicotine  21 mg Transdermal Daily   pantoprazole  40 mg Oral Daily   Continuous Infusions:  cefTRIAXone (ROCEPHIN)  IV     doxycycline (VIBRAMYCIN) IV 100 mg (12/20/23 0111)   potassium PHOSPHATE IVPB (in mmol) 30 mmol (12/20/23 0353)     LOS: 1 day      Charise Killian, MD [facility] Pager [phone]  If 7PM-7AM, please contact night-coverage [email or web address] 12/20/2023, 8:19 AM

## 2023-12-21 DIAGNOSIS — J189 Pneumonia, unspecified organism: Secondary | ICD-10-CM | POA: Diagnosis not present

## 2023-12-21 LAB — BASIC METABOLIC PANEL
Anion gap: 6 (ref 5–15)
BUN: 12 mg/dL (ref 8–23)
CO2: 23 mmol/L (ref 22–32)
Calcium: 8.4 mg/dL — ABNORMAL LOW (ref 8.9–10.3)
Chloride: 109 mmol/L (ref 98–111)
Creatinine, Ser: 0.8 mg/dL (ref 0.44–1.00)
GFR, Estimated: >60 mL/min (ref >60)
Glucose, Bld: 74 mg/dL (ref 70–99)
Potassium: 3.7 mmol/L (ref 3.5–5.1)
Sodium: 138 mmol/L (ref 135–145)

## 2023-12-21 LAB — GLUCOSE, CAPILLARY
Glucose-Capillary: 95 mg/dL (ref 70–99)
Glucose-Capillary: 131 mg/dL — ABNORMAL HIGH (ref 70–99)

## 2023-12-21 LAB — CBC
HCT: 26.6 % — ABNORMAL LOW (ref 36.0–46.0)
Hemoglobin: 8.8 g/dL — ABNORMAL LOW (ref 12.0–15.0)
MCH: 30.3 pg (ref 26.0–34.0)
MCHC: 33.1 g/dL (ref 30.0–36.0)
MCV: 91.7 fL (ref 80.0–100.0)
Platelets: 370 10*3/uL (ref 150–400)
RBC: 2.9 MIL/uL — ABNORMAL LOW (ref 3.87–5.11)
RDW: 14.2 % (ref 11.5–15.5)
WBC: 14.9 10*3/uL — ABNORMAL HIGH (ref 4.0–10.5)
nRBC: 0 % (ref 0.0–0.2)

## 2023-12-21 MED ORDER — IPRATROPIUM-ALBUTEROL 0.5-2.5 (3) MG/3ML IN SOLN: 3.0000 mL | Freq: Four times a day (QID) | RESPIRATORY_TRACT | Status: DC | PRN

## 2023-12-21 MED ORDER — CEFUROXIME AXETIL 500 MG PO TABS: 500.0000 mg | ORAL_TABLET | Freq: Two times a day (BID) | ORAL | 0 refills | Status: AC

## 2023-12-21 MED ORDER — ONDANSETRON 4 MG PO TBDP
4.0000 mg | ORAL_TABLET | Freq: Three times a day (TID) | ORAL | Status: DC | PRN
  Administered 2023-12-21: 4 mg via ORAL
  Filled 2023-12-21: qty 1

## 2023-12-21 MED ORDER — TUSSIONEX PENNKINETIC ER 10-8 MG/5ML PO SUER
5.0000 mL | Freq: Two times a day (BID) | ORAL | 0 refills | Status: AC
Start: 2023-12-21 — End: 2023-12-28

## 2023-12-21 MED ORDER — NICOTINE 14 MG/24HR TD PT24: 14.0000 mg | MEDICATED_PATCH | TRANSDERMAL | 0 refills | Status: AC

## 2023-12-21 NOTE — Discharge Summary (Signed)
 Physician Discharge Summary  Caitlyn Herrera GNF:[id number] DOB: Jan 09, 1961 DOA: 12/19/2023  PCP: [name]  Admit date: 12/19/2023 Discharge date: 12/21/2023  Admitted From: home Disposition:  home   Recommendations for Outpatient Follow-up:  Follow up with PCP in 1-2 weeks   Home Health: no  Equipment/Devices:  Discharge Condition: stable  CODE STATUS: full  Diet recommendation: Heart Healthy / Carb Modified  Brief/Interim Summary: HPI was taken from Dr. Clyde Lundborg:  Caitlyn Herrera is a 63 y.o. female with medical history significant of hypertension, hyperlipidemia, diabetes mellitus, CAD, CABG, diastolic CHF, GERD, depression, carotid artery stenosis, renal artery stenosis, smoker, who presents with cough, SOB, chest pain.   Patient states that she has SOB for almost 3 weeks, which has worsened in the past several days.  Patient has cough with brownish colored sputum production.  She also has chest pain, which is located in front chest, burning-like and also pressure-like pain, constant, moderate, nonradiating.  Her chest pain is pleuritic, aggravated by deep breath and coughing.  She states that since last night she developed fever, chills, nausea, few episodes of nonbilious nonbloody vomiting and several episodes of watery diarrhea.  No abdominal pain.  No symptoms of UTI.   Data reviewed independently and ED Course: pt was found to have WBC 21.6, lactic acid 1.6, troponin 8, negative PCR for COVID, flu and RSV, potassium 3.4, magnesium 1.7, phosphorus 1.4, GFR> 60.  Temperature 102, soft blood pressure 96/52, heart rate of 102, RR 21, oxygen saturation 99% on room air.  Chest x-ray showed left lower lobe infiltration.   CTA and CT abdomen/pelvis: CHEST:  1. No evidence acute pulmonary embolism. 2. LEFT infrahilar masslike fullness surrounding the LEFT lower lobe bronchus. Consolidation in the medial LEFT lower lobe. Differential includes lobar pneumonia  versus bronchogenic carcinoma. Elevated white blood cell count favors pneumonia however narrowing of the LEFT lower lobe bronchus is concerning for malignancy. Potential superimposed pneumonia on bronchogenic carcinoma. 3. Scattered small nodules and consolidation in the LEFT upper lobe are favored pulmonary infection. 4. Post CABG.   PELVIS: 1. No evidence of metastatic disease in the abdomen pelvis. 2. No acute findings in the abdomen pelvis.  Discharge Diagnoses:  Principal Problem:   CAP (community acquired pneumonia) Active Problems:   Sepsis (HCC)   Lung mass_mass like   CAD (coronary artery disease)   Essential hypertension   Hyperlipidemia LDL goal <70   Chronic diastolic CHF (congestive heart failure) (HCC)   Hypokalemia   Hypophosphatemia   Nausea vomiting and diarrhea   Tobacco abuse   Diabetes mellitus without complication (HCC)  Sepsis: met criteria w/ leukocytosis, fever, tachycardia, tachypnea & CAP. Continue on IV rocephin, azithromycin, bronchodilators & encourage incentive spirometry. S/p 1 L NS bolus. Sepsis resolved  CAP: secondary to strept pneumon.Continue on IV rocephin & d/c rocephin, bronchodilators & encourage incentive spirometry. Strept is positive. Legionella is pending   Lung mass: & cannot completely rule out the possibility of malignancy as per CTA chest.  Need to have repeat CT chest outpatient to assess potential lung mass. Pt verbalized her understanding    Hx of CAD: s/p of CABG. Continue on statin, aspirin & restart coreg at d/c   HTN: restart coreg, imdur, losartan, hydrochlorothiazide   HLD: continue on statin   Chronic diastolic CHF:  echo on 06/10/2022 showed EF of 55-60%.  Patient does not have leg edema JVD.  CHF appears compensated. Monitor I/Os   Hypokalemia: WNL today   Hypophosphatemia:  WNL today    Likely gastroenteritis: w/ nausea, vomiting and diarrhea. GI PCR panel & c. diff are both neg. Nausea & vomiting have resolved    Tobacco abuse: received smoking cessation counseling already. Nicotine patch to prevent w/drawl   DM2: pretty well controlled, HbA1c 6.8. Continue on SSI w/ accuchecks   Normocytic anemia: no need for a transfusion currently    Discharge Instructions  Discharge Instructions     Diet - low sodium heart healthy   Complete by: As directed    Diet Carb Modified   Complete by: As directed    Discharge instructions   Complete by: As directed    F/u w/ PCP in 1-2 weeks. Lung mass: and cannot completely rule out the possibility of malignancy as per CTA chest.  Need to have repeat CT chest outpatient to assess potential lung mass.   Increase activity slowly   Complete by: As directed       Allergies as of 12/21/2023   No Known Allergies      Medication List     STOP taking these medications    dextromethorphan-guaiFENesin 10-100 MG/5ML liquid Commonly known as: ROBITUSSIN-DM   ibuprofen 600 MG tablet Commonly known as: ADVIL       TAKE these medications    albuterol 108 (90 Base) MCG/ACT inhaler Commonly known as: VENTOLIN HFA Inhale 2 puffs into the lungs every 6 (six) hours as needed for wheezing or shortness of breath.   aspirin EC 81 MG tablet Take 1 tablet (81 mg total) by mouth daily.   atorvastatin 80 MG tablet Commonly known as: LIPITOR TAKE 1 TABLET (80 MG TOTAL) BY MOUTH DAILY.   azelastine 0.1 % nasal spray Commonly known as: ASTELIN Place 2 sprays into both nostrils as needed.   benzonatate 100 MG capsule Commonly known as: TESSALON Take 1 capsule (100 mg total) by mouth 2 (two) times daily as needed for cough.   carvedilol 12.5 MG tablet Commonly known as: COREG Take 1 tablet (12.5 mg total) by mouth 2 (two) times daily.   cefUROXime 500 MG tablet Commonly known as: CEFTIN Take 1 tablet (500 mg total) by mouth 2 (two) times daily with a meal for 5 days.   cetirizine 10 MG tablet Commonly known as: ZYRTEC Take 10 mg by mouth daily.    citalopram 40 MG tablet Commonly known as: CELEXA Take 60 mg by mouth daily.   cyclobenzaprine 10 MG tablet Commonly known as: FLEXERIL as needed.   ezetimibe 10 MG tablet Commonly known as: ZETIA TAKE 1 TABLET(10 MG) BY MOUTH DAILY   fluticasone 50 MCG/ACT nasal spray Commonly known as: FLONASE as needed.   gabapentin 300 MG capsule Commonly known as: NEURONTIN Take 300 mg by mouth daily. Has not started.   hydrochlorothiazide 12.5 MG capsule Commonly known as: MICROZIDE Take 12.5 mg by mouth daily.   isosorbide mononitrate 60 MG 24 hr tablet Commonly known as: IMDUR TAKE 1 TABLET(60 MG) BY MOUTH DAILY What changed: Another medication with the same name was removed. Continue taking this medication, and follow the directions you see here.   Janumet XR 50-1000 MG Tb24 Generic drug: SitaGLIPtin-MetFORMIN HCl Take 1 tablet by mouth daily.   lidocaine 5 % ointment Commonly known as: XYLOCAINE Apply 1 application topically daily as needed.   losartan 100 MG tablet Commonly known as: COZAAR Take 100 mg by mouth daily.   meloxicam 15 MG tablet Commonly known as: MOBIC Take 15 mg by mouth daily.  nicotine 14 mg/24hr patch Commonly known as: NICODERM CQ - dosed in mg/24 hours Place 1 patch (14 mg total) onto the skin daily. What changed: Another medication with the same name was added. Make sure you understand how and when to take each.   nicotine 14 mg/24hr patch Commonly known as: NICODERM CQ - dosed in mg/24 hours Place 1 patch (14 mg total) onto the skin daily. What changed: You were already taking a medication with the same name, and this prescription was added. Make sure you understand how and when to take each.   nitroGLYCERIN 0.4 MG SL tablet Commonly known as: NITROSTAT DISSOLVE 1 TABLET UNDER THE TONGUE AS NEEDED FOR CHEST PAIN EVERY 5 MINUTES UP TO 3 TIMES. IF NO RELIEF CALL 911.   omeprazole 40 MG capsule Commonly known as: PRILOSEC TAKE 1 CAPSULE BY  MOUTH EVERY DAY BEFORE BREAKFAST   ondansetron 4 MG tablet Commonly known as: Zofran Take 1 tablet (4 mg total) by mouth every 8 (eight) hours as needed for nausea or vomiting.   tiZANidine 2 MG tablet Commonly known as: ZANAFLEX Take 2 mg by mouth 3 (three) times daily.   Trelegy Ellipta 200-62.5-25 MCG/ACT Aepb Generic drug: Fluticasone-Umeclidin-Vilant Inhale 1 puff into the lungs daily.   Tussionex Pennkinetic ER 10-8 MG/5ML Suer Generic drug: chlorpheniramine-HYDROcodone Take 5 mLs by mouth 2 (two) times daily for 7 days.        No Known Allergies  Consultations:    Procedures/Studies: CT Angio Chest PE W and/or Wo Contrast Result Date: 12/19/2023 CLINICAL DATA:  Burning chest pain. Emesis. Fever. * Tracking Code: BO * EXAM: CT ANGIOGRAPHY CHEST CT ABDOMEN AND PELVIS WITH CONTRAST TECHNIQUE: Multidetector CT imaging of the chest was performed using the standard protocol during bolus administration of intravenous contrast. Multiplanar CT image reconstructions and MIPs were obtained to evaluate the vascular anatomy. Multidetector CT imaging of the abdomen and pelvis was performed using the standard protocol during bolus administration of intravenous contrast. RADIATION DOSE REDUCTION: This exam was performed according to the departmental dose-optimization program which includes automated exposure control, adjustment of the mA and/or kV according to patient size and/or use of iterative reconstruction technique. CONTRAST:  OMNIPAQUE IOHEXOL 350 MG/ML SOLN COMPARISON:  Lung cancer screening CT 01/09/2021 FINDINGS: CTA CHEST FINDINGS Cardiovascular: No filling defects within the pulmonary arteries to suggest acute pulmonary embolism. Post CABG Mediastinum/Nodes: LEFT infrahilar masslike fullness measures 3.8 x 3.2 cm (image 72/series 2). This masslike infrahilar fullness surrounds the LEFT lower lobe lobe bronchus. There is collapse of the LEFT lobe bronchus and postobstructive  pneumonitis in the LEFT lower lobe. Lungs/Pleura: Scattered small nodule LEFT upper lobe airspace pattern. Small focus consolidation medial LEFT upper lobe measures 10 mm image 71. RIGHT lung is clear. Musculoskeletal: No aggressive osseous lesion. Review of the MIP images confirms the above findings. CT ABDOMEN and PELVIS FINDINGS Hepatobiliary: No focal hepatic lesion. Postcholecystectomy. No biliary dilatation. Pancreas: Pancreas is normal. No ductal dilatation. No pancreatic inflammation. Spleen: Normal spleen Adrenals/urinary tract: Adrenal glands and kidneys are normal. The ureters and bladder normal. Stomach/Bowel: Stomach, small bowel, appendix, and cecum are normal. The colon and rectosigmoid colon are normal. Vascular/Lymphatic: Abdominal aorta is normal caliber. No periportal or retroperitoneal adenopathy. No pelvic adenopathy. Reproductive: Uterus and adnexa unremarkable. Other: No free fluid. Musculoskeletal: No aggressive osseous lesion. Review of the MIP images confirms the above findings. IMPRESSION: CHEST: 1. No evidence acute pulmonary embolism. 2. LEFT infrahilar masslike fullness surrounding the LEFT lower lobe bronchus.  Consolidation in the medial LEFT lower lobe. Differential includes lobar pneumonia versus bronchogenic carcinoma. Elevated white blood cell count favors pneumonia however narrowing of the LEFT lower lobe bronchus is concerning for malignancy. Potential superimposed pneumonia on bronchogenic carcinoma. 3. Scattered small nodules and consolidation in the LEFT upper lobe are favored pulmonary infection. 4. Post CABG. PELVIS: 1. No evidence of metastatic disease in the abdomen pelvis. 2. No acute findings in the abdomen pelvis. Electronically Signed   By: Genevive Bi M.D.   On: 12/19/2023 20:14   CT ABDOMEN PELVIS W CONTRAST Result Date: 12/19/2023 CLINICAL DATA:  Burning chest pain. Emesis. Fever. * Tracking Code: BO * EXAM: CT ANGIOGRAPHY CHEST CT ABDOMEN AND PELVIS WITH  CONTRAST TECHNIQUE: Multidetector CT imaging of the chest was performed using the standard protocol during bolus administration of intravenous contrast. Multiplanar CT image reconstructions and MIPs were obtained to evaluate the vascular anatomy. Multidetector CT imaging of the abdomen and pelvis was performed using the standard protocol during bolus administration of intravenous contrast. RADIATION DOSE REDUCTION: This exam was performed according to the departmental dose-optimization program which includes automated exposure control, adjustment of the mA and/or kV according to patient size and/or use of iterative reconstruction technique. CONTRAST:  OMNIPAQUE IOHEXOL 350 MG/ML SOLN COMPARISON:  Lung cancer screening CT 01/09/2021 FINDINGS: CTA CHEST FINDINGS Cardiovascular: No filling defects within the pulmonary arteries to suggest acute pulmonary embolism. Post CABG Mediastinum/Nodes: LEFT infrahilar masslike fullness measures 3.8 x 3.2 cm (image 72/series 2). This masslike infrahilar fullness surrounds the LEFT lower lobe lobe bronchus. There is collapse of the LEFT lobe bronchus and postobstructive pneumonitis in the LEFT lower lobe. Lungs/Pleura: Scattered small nodule LEFT upper lobe airspace pattern. Small focus consolidation medial LEFT upper lobe measures 10 mm image 71. RIGHT lung is clear. Musculoskeletal: No aggressive osseous lesion. Review of the MIP images confirms the above findings. CT ABDOMEN and PELVIS FINDINGS Hepatobiliary: No focal hepatic lesion. Postcholecystectomy. No biliary dilatation. Pancreas: Pancreas is normal. No ductal dilatation. No pancreatic inflammation. Spleen: Normal spleen Adrenals/urinary tract: Adrenal glands and kidneys are normal. The ureters and bladder normal. Stomach/Bowel: Stomach, small bowel, appendix, and cecum are normal. The colon and rectosigmoid colon are normal. Vascular/Lymphatic: Abdominal aorta is normal caliber. No periportal or retroperitoneal  adenopathy. No pelvic adenopathy. Reproductive: Uterus and adnexa unremarkable. Other: No free fluid. Musculoskeletal: No aggressive osseous lesion. Review of the MIP images confirms the above findings. IMPRESSION: CHEST: 1. No evidence acute pulmonary embolism. 2. LEFT infrahilar masslike fullness surrounding the LEFT lower lobe bronchus. Consolidation in the medial LEFT lower lobe. Differential includes lobar pneumonia versus bronchogenic carcinoma. Elevated white blood cell count favors pneumonia however narrowing of the LEFT lower lobe bronchus is concerning for malignancy. Potential superimposed pneumonia on bronchogenic carcinoma. 3. Scattered small nodules and consolidation in the LEFT upper lobe are favored pulmonary infection. 4. Post CABG. PELVIS: 1. No evidence of metastatic disease in the abdomen pelvis. 2. No acute findings in the abdomen pelvis. Electronically Signed   By: Genevive Bi M.D.   On: 12/19/2023 20:14   DG Chest 2 View Result Date: 12/19/2023 CLINICAL DATA:  Fever and chest pain. EXAM: CHEST - 2 VIEW COMPARISON:  03/18/2023 FINDINGS: The heart size and mediastinal contours are within normal limits. Prior CABG again noted. New left lower lobe airspace disease is seen, consistent with pneumonia. No evidence of pleural effusion. IMPRESSION: New left lower lobe airspace disease, consistent with pneumonia. Recommend continued chest radiographic follow-up to confirm resolution. Electronically  Signed   By: Danae Orleans M.D.   On: 12/19/2023 18:15   (Echo, Carotid, EGD, Colonoscopy, ERCP)    Subjective: Pt c/o cough   Discharge Exam: Vitals:   12/21/23 0342 12/21/23 0819  BP: 122/63 119/73  Pulse: (!) 59 66  Resp: 18 16  Temp: 98.8 F (37.1 C) 98.3 F (36.8 C)  SpO2: 100% 100%   Vitals:   12/20/23 1937 12/21/23 0340 12/21/23 0342 12/21/23 0819  BP: (!) 107/56  122/63 119/73  Pulse: 75  (!) 59 66  Resp: 16  18 16   Temp: 98.8 F (37.1 C)  98.8 F (37.1 C) 98.3 F  (36.8 C)  TempSrc: Oral     SpO2: 100%  100% 100%  Weight:  74.9 kg    Height:        General: Pt is alert, awake, not in acute distress Cardiovascular: S1/S2 +, no rubs, no gallops Respiratory: diminished breath sounds b/l  Abdominal: Soft, NT, ND, bowel sounds + Extremities: no edema, no cyanosis    The results of significant diagnostics from this hospitalization (including imaging, microbiology, ancillary and laboratory) are listed below for reference.     Microbiology: Recent Results (from the past 240 hours)  Resp panel by RT-PCR (RSV, Flu A&B, Covid) Anterior Nasal Swab     Status: None   Collection Time: 12/19/23  5:10 PM   Specimen: Anterior Nasal Swab  Result Value Ref Range Status   SARS Coronavirus 2 by RT PCR NEGATIVE NEGATIVE Final    Comment: (NOTE) SARS-CoV-2 target nucleic acids are NOT DETECTED.  The SARS-CoV-2 RNA is generally detectable in upper respiratory specimens during the acute phase of infection. The lowest concentration of SARS-CoV-2 viral copies this assay can detect is 138 copies/mL. A negative result does not preclude SARS-Cov-2 infection and should not be used as the sole basis for treatment or other patient management decisions. A negative result may occur with  improper specimen collection/handling, submission of specimen other than nasopharyngeal swab, presence of viral mutation(s) within the areas targeted by this assay, and inadequate number of viral copies(<138 copies/mL). A negative result must be combined with clinical observations, patient history, and epidemiological information. The expected result is Negative.  Fact Sheet for Patients:  BloggerCourse.com  Fact Sheet for Healthcare Providers:  SeriousBroker.it  This test is no t yet approved or cleared by the [place] FDA and  has been authorized for detection and/or diagnosis of SARS-CoV-2 by FDA under an Emergency Use  Authorization (EUA). This EUA will remain  in effect (meaning this test can be used) for the duration of the COVID-19 declaration under Section 564(b)(1) of the Act, 21 U.S.C.section 360bbb-3(b)(1), unless the authorization is terminated  or revoked sooner.       Influenza A by PCR NEGATIVE NEGATIVE Final   Influenza B by PCR NEGATIVE NEGATIVE Final    Comment: (NOTE) The Xpert Xpress SARS-CoV-2/FLU/RSV plus assay is intended as an aid in the diagnosis of influenza from Nasopharyngeal swab specimens and should not be used as a sole basis for treatment. Nasal washings and aspirates are unacceptable for Xpert Xpress SARS-CoV-2/FLU/RSV testing.  Fact Sheet for Patients: BloggerCourse.com  Fact Sheet for Healthcare Providers: SeriousBroker.it  This test is not yet approved or cleared by the [place] FDA and has been authorized for detection and/or diagnosis of SARS-CoV-2 by FDA under an Emergency Use Authorization (EUA). This EUA will remain in effect (meaning this test can be used) for the duration of the COVID-19  declaration under Section 564(b)(1) of the Act, 21 U.S.C. section 360bbb-3(b)(1), unless the authorization is terminated or revoked.     Resp Syncytial Virus by PCR NEGATIVE NEGATIVE Final    Comment: (NOTE) Fact Sheet for Patients: BloggerCourse.com  Fact Sheet for Healthcare Providers: SeriousBroker.it  This test is not yet approved or cleared by the [place] FDA and has been authorized for detection and/or diagnosis of SARS-CoV-2 by FDA under an Emergency Use Authorization (EUA). This EUA will remain in effect (meaning this test can be used) for the duration of the COVID-19 declaration under Section 564(b)(1) of the Act, 21 U.S.C. section 360bbb-3(b)(1), unless the authorization is terminated or revoked.  Performed at [facility], [address] [place], [place] [address]   Blood Culture (routine x 2)     Status: None (Preliminary result)   Collection Time: 12/19/23  6:47 PM   Specimen: BLOOD LEFT ARM  Result Value Ref Range Status   Specimen Description BLOOD LEFT ARM  Final   Special Requests   Final    BOTTLES DRAWN AEROBIC AND ANAEROBIC Blood Culture adequate volume   Culture   Final    NO GROWTH 2 DAYS Performed at [facility], [address] [place], [place] [address]    Report Status PENDING  Incomplete  Blood Culture (routine x 2)     Status: None (Preliminary result)   Collection Time: 12/19/23  6:47 PM   Specimen: BLOOD RIGHT ARM  Result Value Ref Range Status   Specimen Description BLOOD RIGHT ARM  Final   Special Requests   Final    BOTTLES DRAWN AEROBIC AND ANAEROBIC Blood Culture results may not be optimal due to an inadequate volume of blood received in culture bottles   Culture   Final    NO GROWTH 2 DAYS Performed at [facility], [address], [place], [place] [address]    Report Status PENDING  Incomplete  C Difficile Quick Screen w PCR reflex     Status: None   Collection Time: 12/20/23  2:15 PM   Specimen: STOOL  Result Value Ref Range Status   C Diff antigen NEGATIVE NEGATIVE Final   C Diff toxin NEGATIVE NEGATIVE Final   C Diff interpretation No C. difficile detected.  Final    Comment: Performed at [facility], [address] [place], [place] [address]  Gastrointestinal Panel by PCR , Stool     Status: None   Collection Time: 12/20/23  2:15 PM   Specimen: STOOL  Result Value Ref Range Status   Campylobacter species NOT DETECTED NOT DETECTED Final   Plesimonas shigelloides NOT DETECTED NOT DETECTED Final   Salmonella species NOT DETECTED NOT DETECTED Final   Yersinia enterocolitica NOT DETECTED NOT DETECTED Final   Vibrio species NOT DETECTED NOT DETECTED Final   Vibrio cholerae NOT DETECTED NOT DETECTED Final   Enteroaggregative E coli (EAEC) NOT  DETECTED NOT DETECTED Final   Enteropathogenic E coli (EPEC) NOT DETECTED NOT DETECTED Final   Enterotoxigenic E coli (ETEC) NOT DETECTED NOT DETECTED Final   Shiga like toxin producing E coli (STEC) NOT DETECTED NOT DETECTED Final   Shigella/Enteroinvasive E coli (EIEC) NOT DETECTED NOT DETECTED Final   Cryptosporidium NOT DETECTED NOT DETECTED Final   Cyclospora cayetanensis NOT DETECTED NOT DETECTED Final   Entamoeba histolytica NOT DETECTED NOT DETECTED Final   Giardia lamblia NOT DETECTED NOT DETECTED Final   Adenovirus F40/41 NOT DETECTED NOT DETECTED Final   Astrovirus  NOT DETECTED NOT DETECTED Final   Norovirus GI/GII NOT DETECTED NOT DETECTED Final   Rotavirus A NOT DETECTED NOT DETECTED Final   Sapovirus (I, II, IV, and V) NOT DETECTED NOT DETECTED Final    Comment: Performed at [facility], [address] [place], [place] [address]     Labs: BNP (last 3 results) Recent Labs    12/19/23 1710  BNP 132.1*   Basic Metabolic Panel: Recent Labs  Lab 12/19/23 1710 12/20/23 0515 12/21/23 0413  NA 134* 139 138  K 3.4* 4.0 3.7  CL 100 109 109  CO2 22 25 23   GLUCOSE 148* 106* 74  BUN 11 11 12   CREATININE 0.95 0.76 0.80  CALCIUM 9.4 8.4* 8.4*  MG 1.7  --   --   PHOS 1.4* 3.1  --    Liver Function Tests: Recent Labs  Lab 12/19/23 1710  AST 16  ALT 11  ALKPHOS 62  BILITOT 1.6*  PROT 7.5  ALBUMIN 3.3*   Recent Labs  Lab 12/19/23 1710  LIPASE 29   No results for input(s): "AMMONIA" in the last 168 hours. CBC: Recent Labs  Lab 12/19/23 1710 12/20/23 0515 12/21/23 0413  WBC 21.6* 22.8* 14.9*  HGB 10.7* 8.8* 8.8*  HCT 32.1* 26.7* 26.6*  MCV 90.7 91.4 91.7  PLT 405* 365 370   Cardiac Enzymes: No results for input(s): "CKTOTAL", "CKMB", "CKMBINDEX", "TROPONINI" in the last 168 hours. BNP: Invalid input(s): "POCBNP" CBG: Recent Labs  Lab 12/20/23 1133 12/20/23 1610 12/20/23 2113 12/21/23 0818 12/21/23 1110  GLUCAP 131* 82 90 95 131*    D-Dimer No results for input(s): "DDIMER" in the last 72 hours. Hgb A1c No results for input(s): "HGBA1C" in the last 72 hours. Lipid Profile No results for input(s): "CHOL", "HDL", "LDLCALC", "TRIG", "CHOLHDL", "LDLDIRECT" in the last 72 hours. Thyroid function studies No results for input(s): "TSH", "T4TOTAL", "T3FREE", "THYROIDAB" in the last 72 hours.  Invalid input(s): "FREET3" Anemia work up No results for input(s): "VITAMINB12", "FOLATE", "FERRITIN", "TIBC", "IRON", "RETICCTPCT" in the last 72 hours. Urinalysis    Component Value Date/Time   COLORURINE YELLOW (A) 12/20/2023 1415   APPEARANCEUR CLEAR (A) 12/20/2023 1415   LABSPEC 1.040 (H) 12/20/2023 1415   PHURINE 5.0 12/20/2023 1415   GLUCOSEU NEGATIVE 12/20/2023 1415   HGBUR NEGATIVE 12/20/2023 1415   BILIRUBINUR NEGATIVE 12/20/2023 1415   KETONESUR NEGATIVE 12/20/2023 1415   PROTEINUR NEGATIVE 12/20/2023 1415   NITRITE NEGATIVE 12/20/2023 1415   LEUKOCYTESUR NEGATIVE 12/20/2023 1415   Sepsis Labs Recent Labs  Lab 12/19/23 1710 12/20/23 0515 12/21/23 0413  WBC 21.6* 22.8* 14.9*   Microbiology Recent Results (from the past 240 hours)  Resp panel by RT-PCR (RSV, Flu A&B, Covid) Anterior Nasal Swab     Status: None   Collection Time: 12/19/23  5:10 PM   Specimen: Anterior Nasal Swab  Result Value Ref Range Status   SARS Coronavirus 2 by RT PCR NEGATIVE NEGATIVE Final    Comment: (NOTE) SARS-CoV-2 target nucleic acids are NOT DETECTED.  The SARS-CoV-2 RNA is generally detectable in upper respiratory specimens during the acute phase of infection. The lowest concentration of SARS-CoV-2 viral copies this assay can detect is 138 copies/mL. A negative result does not preclude SARS-Cov-2 infection and should not be used as the sole basis for treatment or other patient management decisions. A negative result may occur with  improper specimen collection/handling, submission of specimen other than nasopharyngeal  swab, presence of viral mutation(s) within the areas  targeted by this assay, and inadequate number of viral copies(<138 copies/mL). A negative result must be combined with clinical observations, patient history, and epidemiological information. The expected result is Negative.  Fact Sheet for Patients:  BloggerCourse.com  Fact Sheet for Healthcare Providers:  SeriousBroker.it  This test is no t yet approved or cleared by the [place] FDA and  has been authorized for detection and/or diagnosis of SARS-CoV-2 by FDA under an Emergency Use Authorization (EUA). This EUA will remain  in effect (meaning this test can be used) for the duration of the COVID-19 declaration under Section 564(b)(1) of the Act, 21 U.S.C.section 360bbb-3(b)(1), unless the authorization is terminated  or revoked sooner.       Influenza A by PCR NEGATIVE NEGATIVE Final   Influenza B by PCR NEGATIVE NEGATIVE Final    Comment: (NOTE) The Xpert Xpress SARS-CoV-2/FLU/RSV plus assay is intended as an aid in the diagnosis of influenza from Nasopharyngeal swab specimens and should not be used as a sole basis for treatment. Nasal washings and aspirates are unacceptable for Xpert Xpress SARS-CoV-2/FLU/RSV testing.  Fact Sheet for Patients: BloggerCourse.com  Fact Sheet for Healthcare Providers: SeriousBroker.it  This test is not yet approved or cleared by the [place] FDA and has been authorized for detection and/or diagnosis of SARS-CoV-2 by FDA under an Emergency Use Authorization (EUA). This EUA will remain in effect (meaning this test can be used) for the duration of the COVID-19 declaration under Section 564(b)(1) of the Act, 21 U.S.C. section 360bbb-3(b)(1), unless the authorization is terminated or revoked.     Resp Syncytial Virus by PCR NEGATIVE NEGATIVE Final    Comment: (NOTE) Fact Sheet for  Patients: BloggerCourse.com  Fact Sheet for Healthcare Providers: SeriousBroker.it  This test is not yet approved or cleared by the [place] FDA and has been authorized for detection and/or diagnosis of SARS-CoV-2 by FDA under an Emergency Use Authorization (EUA). This EUA will remain in effect (meaning this test can be used) for the duration of the COVID-19 declaration under Section 564(b)(1) of the Act, 21 U.S.C. section 360bbb-3(b)(1), unless the authorization is terminated or revoked.  Performed at [facility], [address] [place], [place] [address]   Blood Culture (routine x 2)     Status: None (Preliminary result)   Collection Time: 12/19/23  6:47 PM   Specimen: BLOOD LEFT ARM  Result Value Ref Range Status   Specimen Description BLOOD LEFT ARM  Final   Special Requests   Final    BOTTLES DRAWN AEROBIC AND ANAEROBIC Blood Culture adequate volume   Culture   Final    NO GROWTH 2 DAYS Performed at [facility], [address], [place], [place] [address]    Report Status PENDING  Incomplete  Blood Culture (routine x 2)     Status: None (Preliminary result)   Collection Time: 12/19/23  6:47 PM   Specimen: BLOOD RIGHT ARM  Result Value Ref Range Status   Specimen Description BLOOD RIGHT ARM  Final   Special Requests   Final    BOTTLES DRAWN AEROBIC AND ANAEROBIC Blood Culture results may not be optimal due to an inadequate volume of blood received in culture bottles   Culture   Final    NO GROWTH 2 DAYS Performed at [facility], [address], [place], [place] [address]    Report Status PENDING  Incomplete  C Difficile Quick Screen w PCR reflex     Status: None   Collection  Time: 12/20/23  2:15 PM   Specimen: STOOL  Result Value Ref Range Status   C Diff antigen NEGATIVE NEGATIVE Final   C Diff toxin NEGATIVE NEGATIVE Final   C Diff interpretation No C. difficile detected.  Final     Comment: Performed at [facility], [address] [place], [place] [address]  Gastrointestinal Panel by PCR , Stool     Status: None   Collection Time: 12/20/23  2:15 PM   Specimen: STOOL  Result Value Ref Range Status   Campylobacter species NOT DETECTED NOT DETECTED Final   Plesimonas shigelloides NOT DETECTED NOT DETECTED Final   Salmonella species NOT DETECTED NOT DETECTED Final   Yersinia enterocolitica NOT DETECTED NOT DETECTED Final   Vibrio species NOT DETECTED NOT DETECTED Final   Vibrio cholerae NOT DETECTED NOT DETECTED Final   Enteroaggregative E coli (EAEC) NOT DETECTED NOT DETECTED Final   Enteropathogenic E coli (EPEC) NOT DETECTED NOT DETECTED Final   Enterotoxigenic E coli (ETEC) NOT DETECTED NOT DETECTED Final   Shiga like toxin producing E coli (STEC) NOT DETECTED NOT DETECTED Final   Shigella/Enteroinvasive E coli (EIEC) NOT DETECTED NOT DETECTED Final   Cryptosporidium NOT DETECTED NOT DETECTED Final   Cyclospora cayetanensis NOT DETECTED NOT DETECTED Final   Entamoeba histolytica NOT DETECTED NOT DETECTED Final   Giardia lamblia NOT DETECTED NOT DETECTED Final   Adenovirus F40/41 NOT DETECTED NOT DETECTED Final   Astrovirus NOT DETECTED NOT DETECTED Final   Norovirus GI/GII NOT DETECTED NOT DETECTED Final   Rotavirus A NOT DETECTED NOT DETECTED Final   Sapovirus (I, II, IV, and V) NOT DETECTED NOT DETECTED Final    Comment: Performed at [facility], [address] [place], [place] [address]     Time coordinating discharge: Over 30 minutes  SIGNED:   Charise Killian, MD  [facility] 12/21/2023, 11:54 AM Pager   If 7PM-7AM, please contact night-coverage [email or web address]

## 2023-12-21 NOTE — Plan of Care (Signed)
  Problem: Education: Goal: Ability to describe self-care measures that may prevent or decrease complications (Diabetes Survival Skills Education) will improve Outcome: Adequate for Discharge Goal: Individualized Educational Video(s) Outcome: Adequate for Discharge   Problem: Coping: Goal: Ability to adjust to condition or change in health will improve Outcome: Adequate for Discharge   Problem: Fluid Volume: Goal: Ability to maintain a balanced intake and output will improve Outcome: Adequate for Discharge   Problem: Health Behavior/Discharge Planning: Goal: Ability to identify and utilize available resources and services will improve Outcome: Adequate for Discharge Goal: Ability to manage health-related needs will improve Outcome: Adequate for Discharge   Problem: Metabolic: Goal: Ability to maintain appropriate glucose levels will improve Outcome: Adequate for Discharge   Problem: Nutritional: Goal: Maintenance of adequate nutrition will improve Outcome: Adequate for Discharge Goal: Progress toward achieving an optimal weight will improve Outcome: Adequate for Discharge   Problem: Skin Integrity: Goal: Risk for impaired skin integrity will decrease Outcome: Adequate for Discharge   Problem: Tissue Perfusion: Goal: Adequacy of tissue perfusion will improve Outcome: Adequate for Discharge   Problem: Education: Goal: Knowledge of General Education information will improve Description: Including pain rating scale, medication(s)/side effects and non-pharmacologic comfort measures Outcome: Adequate for Discharge   Problem: Health Behavior/Discharge Planning: Goal: Ability to manage health-related needs will improve Outcome: Adequate for Discharge   Problem: Clinical Measurements: Goal: Ability to maintain clinical measurements within normal limits will improve Outcome: Adequate for Discharge Goal: Will remain free from infection Outcome: Adequate for Discharge Goal:  Diagnostic test results will improve Outcome: Adequate for Discharge Goal: Respiratory complications will improve Outcome: Adequate for Discharge Goal: Cardiovascular complication will be avoided Outcome: Adequate for Discharge   Problem: Activity: Goal: Risk for activity intolerance will decrease Outcome: Adequate for Discharge   Problem: Nutrition: Goal: Adequate nutrition will be maintained Outcome: Adequate for Discharge   Problem: Coping: Goal: Level of anxiety will decrease Outcome: Adequate for Discharge   Problem: Elimination: Goal: Will not experience complications related to bowel motility Outcome: Adequate for Discharge Goal: Will not experience complications related to urinary retention Outcome: Adequate for Discharge   Problem: Pain Managment: Goal: General experience of comfort will improve and/or be controlled Outcome: Adequate for Discharge   Problem: Safety: Goal: Ability to remain free from injury will improve Outcome: Adequate for Discharge   Problem: Skin Integrity: Goal: Risk for impaired skin integrity will decrease Outcome: Adequate for Discharge   Problem: Activity: Goal: Ability to tolerate increased activity will improve Outcome: Adequate for Discharge   Problem: Clinical Measurements: Goal: Ability to maintain a body temperature in the normal range will improve Outcome: Adequate for Discharge   Problem: Respiratory: Goal: Ability to maintain adequate ventilation will improve Outcome: Adequate for Discharge Goal: Ability to maintain a clear airway will improve Outcome: Adequate for Discharge

## 2023-12-23 LAB — LEGIONELLA PNEUMOPHILA SEROGP 1 UR AG: L. pneumophila Serogp 1 Ur Ag: NEGATIVE

## 2023-12-24 LAB — CULTURE, BLOOD (ROUTINE X 2)
Culture: NO GROWTH
Culture: NO GROWTH
Special Requests: ADEQUATE

## 2024-01-19 ENCOUNTER — Encounter: Payer: Self-pay | Admitting: Pulmonary Disease

## 2024-01-19 ENCOUNTER — Ambulatory Visit: Admitting: Pulmonary Disease

## 2024-01-19 VITALS — BP 132/80 | HR 66 | Temp 96.9°F | Ht 67.0 in | Wt 157.0 lb

## 2024-01-19 DIAGNOSIS — J189 Pneumonia, unspecified organism: Secondary | ICD-10-CM

## 2024-01-19 NOTE — Progress Notes (Signed)
 Synopsis: Referred in by [name]   Subjective:   PATIENT ID: Caitlyn Herrera GENDER: female DOB: [birth year], MRN: [id number]  Chief Complaint  Patient presents with   Follow-up    ED on 12/19/2023 for Pneumonia. DOE. Little wheezing. Cough with clear sputum.    HPI Caitlyn Herrera is a pleasant 63 years old female patient with a past medical history of COPD with asthma overlap follows with Dr. Jayme Cloud, tobacco use and GERD presenting today to the pulmonary office for am inpatient follow up visit.   She was admitted at [facility] from 02/21 to 02/25 with shortness of breath, cough with sputum production, pleuritic chest pain and fevers. CTA of the chest was done that showed a left lower lobe consolidation and left endobronchial lesion. She was found to have strep pneumo + urine ag and was treated for strep pneumonia.   Today reports feeling significantly better but still has cough with sputum production. Denies any fevers, chills, chest pain or shortness of breath.    ROS All systems were reviewed and are negative except for the above.  Objective:   Vitals:   01/19/24 1315  BP: 132/80  Pulse: 66  Temp: (!) 96.9 F (36.1 C)  SpO2: 100%  Weight: 157 lb (71.2 kg)  Height: 5\' 7"  (1.702 m)   100% on RA BMI Readings from Last 3 Encounters:  01/19/24 24.59 kg/m  12/21/23 25.86 kg/m  03/18/23 28.25 kg/m   Wt Readings from Last 3 Encounters:  01/19/24 157 lb (71.2 kg)  12/21/23 165 lb 2 oz (74.9 kg)  03/18/23 175 lb (79.4 kg)    Physical Exam GEN: NAD, Healthy Appearing HEENT: Supple Neck, Reactive Pupils, EOMI  CVS: Normal S1, Normal S2, RRR, No murmurs or ES appreciated  Lungs: Ronchi heard diffusely with faint expiratory wheezing.  Abdomen: Soft, non tender, non distended, + BS  Extremities: Warm and well perfused, No edema  Skin: No suspicious lesions appreciated  Psych: Normal Affect  Ancillary Information   CBC    Component Value Date/Time    WBC 14.9 (H) 12/21/2023 0413   RBC 2.90 (L) 12/21/2023 0413   HGB 8.8 (L) 12/21/2023 0413   HGB 11.2 03/26/2018 1042   HCT 26.6 (L) 12/21/2023 0413   HCT 34.1 03/26/2018 1042   PLT 370 12/21/2023 0413   PLT 454 (H) 03/26/2018 1042   MCV 91.7 12/21/2023 0413   MCV 86 03/26/2018 1042   MCH 30.3 12/21/2023 0413   MCHC 33.1 12/21/2023 0413   RDW 14.2 12/21/2023 0413   RDW 16.9 (H) 03/26/2018 1042   LYMPHSABS 3.9 05/25/2019 1109   LYMPHSABS 3.7 (H) 03/26/2018 1042   MONOABS 0.7 05/25/2019 1109   EOSABS 0.1 05/25/2019 1109   EOSABS 0.2 03/26/2018 1042   BASOSABS 0.0 05/25/2019 1109   BASOSABS 0.0 03/26/2018 1042       Latest Ref Rng & Units 10/24/2022   11:37 AM 11/06/2017    7:59 AM  PFT Results  FVC-Pre L 1.76  2.88   FVC-Predicted Pre % 48  95   FVC-Post L 1.95  2.67   FVC-Predicted Post % 54  88   Pre FEV1/FVC % % 82  80   Post FEV1/FCV % % 82  85   FEV1-Pre L 1.44  2.30   FEV1-Predicted Pre % 52  96   FEV1-Post L 1.60  2.26   DLCO uncorrected ml/min/mmHg 10.45    DLCO UNC% % 47    DLVA Predicted %  87    TLC L 3.56    TLC % Predicted % 64    RV % Predicted % 74       Assessment & Plan:  Caitlyn Herrera is a pleasant 63 years old female patient with a past medical history of COPD with asthma overlap follows with Dr. Jayme Cloud, tobacco use and GERD presenting today to the pulmonary office for am inpatient follow up visit.   #Strep Pneumo pneumonia 02/21 - 02/25  # CTA chest with left main endobronchial lesion and left lower lobe consolidation.   Now this could represent a mucous plug in the setting of her underlying pneumonia vs an endobronchial lesion with post obstructive pneumonia given her history of tobacco use. I am planning on repeating a CT chest wo contrast in 4 weeks and if present a bronchoscopy would be warranted.   She will continue with Trelegy as prescribed and follow up with Dr. Jayme Cloud in 4 weeks.   Return in about 4 weeks (around 02/16/2024) for with  ct chest. Follow up with Dr. Jayme Cloud.  I spent 60 minutes caring for this patient today, including preparing to see the patient, obtaining a medical history , reviewing a separately obtained history, performing a medically appropriate examination and/or evaluation, counseling and educating the patient/family/caregiver, documenting clinical information in the electronic health record, and independently interpreting results (not separately reported/billed) and communicating results to the patient/family/caregiver  Janann Colonel, MD [facility] 01/19/2024 1:39 PM

## 2024-01-27 ENCOUNTER — Other Ambulatory Visit: Payer: Self-pay | Admitting: Physician Assistant

## 2024-02-04 ENCOUNTER — Ambulatory Visit: Attending: Medical | Admitting: Medical

## 2024-02-04 ENCOUNTER — Encounter: Payer: Self-pay | Admitting: Medical

## 2024-02-04 VITALS — BP 124/76 | HR 63 | Ht 67.0 in | Wt 157.0 lb

## 2024-02-04 DIAGNOSIS — Z720 Tobacco use: Secondary | ICD-10-CM

## 2024-02-04 DIAGNOSIS — I10 Essential (primary) hypertension: Secondary | ICD-10-CM | POA: Diagnosis not present

## 2024-02-04 DIAGNOSIS — Z79899 Other long term (current) drug therapy: Secondary | ICD-10-CM | POA: Diagnosis not present

## 2024-02-04 DIAGNOSIS — I2510 Atherosclerotic heart disease of native coronary artery without angina pectoris: Secondary | ICD-10-CM | POA: Diagnosis not present

## 2024-02-04 DIAGNOSIS — E782 Mixed hyperlipidemia: Secondary | ICD-10-CM

## 2024-02-04 DIAGNOSIS — J449 Chronic obstructive pulmonary disease, unspecified: Secondary | ICD-10-CM

## 2024-02-04 DIAGNOSIS — D649 Anemia, unspecified: Secondary | ICD-10-CM

## 2024-02-04 MED ORDER — LOSARTAN POTASSIUM 25 MG PO TABS: 12.5000 mg | ORAL_TABLET | Freq: Every day | ORAL | 3 refills | Status: AC

## 2024-02-04 MED ORDER — ISOSORBIDE MONONITRATE ER 30 MG PO TB24: 15.0000 mg | ORAL_TABLET | Freq: Every day | ORAL | 3 refills | Status: DC

## 2024-02-04 MED ORDER — CARVEDILOL 12.5 MG PO TABS: 12.5000 mg | ORAL_TABLET | Freq: Two times a day (BID) | ORAL | 3 refills | Status: DC

## 2024-02-04 NOTE — Progress Notes (Unsigned)
 Cardiology Office Note:  .   Date:  02/04/2024  ID:  Caitlyn Herrera, DOB 01/19/1961, MRN [id number] PCP: [name]  [facility] Providers Cardiologist:  Yvonne Kendall, MD {  History of Present Illness: .   Caitlyn Herrera is a 63 y.o. female with a h/o CAD s/p 3V CABG 10/2017, mild pulmonary hypertension, COPD secondary to tobacco use, diabetes type 2, hypertension, hyperlipidemia, intermittent medical adherence, GERD who presents for follow-up of CAD.  Patient was admitted to the hospital in January 2019 with a non-STEMI and underwent diagnostic left heart cath which showed severe multivessel CAD.  Echo showed normal LVSF.  She subsequently underwent three-vessel CABG with LIMA to LAD, SVG to OM 2, and SVG to RPDA.  She required repeat left heart cath in February 2019 in the setting of subsequent admission with mild troponin elevation and abnormal stress testing which showed 3 out of 3 patent grafts with a native multivessel disease including 80% stenosis at the anastomosis of the SVG to OM 2.  This area was not felt to be amenable to PCI and she was medically managed.  Since her bypass, she has been evaluated multiple times for complaints of palpitations with cardiac monitoring being unrevealing in May 2019 and July 2019.  On both occasions, duration of monitor compliance was limited.  Patient reported chest pain that was felt to be acid reflux.  She also had residual shortness of breath that was felt to be multifactorial including underlying CAD, COPD and GERD.  Echo in 2021 showed EF of 60 to 65%, no wall motion abnormalities, mild LVH, normal LV diastolic function parameters.  Echo in August 2023 showed EF of 55 to 60%, no wall motion abnormality, normal LV diastolic function, trivial MR.  Patient had persistent chest heaviness and dyspnea in 2023 and underwent right left heart cath December 2023 that showed severe native three-vessel CAD similar to prior  cath in 2019 with widely patent LIMA to LAD, patent SVG to OM 2 with 80% stenosis in the OM 2 just distal to the anastomosis as well as moderate ectasia of the distal portion of the SVG, occluded SVG to RPDA which did not appear to be acute though was new when compared to catheter in 2019, normal left heart, right heart, pulmonary artery pressure, normal cardiac output and index.  Medical therapy and risk factor modification was recommended.  She was last seen in May 2024 was overall doing well from a cardiac perspective.  Blood pressure was elevated and Coreg was increased.  The patient was admitted late February 2025 with chest pain.  She was febrile of 102 Fahrenheit.  EKG showed normal sinus rhythm.  Chest x-ray showed possible pneumonia.  Today, the patient is overall doing well. She is needing refills. She denies chest pain, she has DOE. No lower leg edema, orthopnea or pnd.    Studies Reviewed: [name]   EKG Interpretation Date/Time:  Wednesday February 04 2024 14:58:14 EDT Ventricular Rate:  61 PR Interval:  146 QRS Duration:  74 QT Interval:  466 QTC Calculation: 469 R Axis:   8  Text Interpretation: Normal sinus rhythm Possible Left atrial enlargement Septal infarct (cited on or before 19-Dec-2023) ST & T wave abnormality, consider inferior ischemia ST & T wave abnormality, consider anterolateral ischemia When compared with ECG of 19-Dec-2023 18:47, No significant change was found Confirmed by Fransico Michael, Ellasyn Swilling ([id number]) on 02/04/2024 4:15:17 PM    R/L heart cath 09/2022 Conclusions: Severe native  three-vessel coronary artery disease, similar to prior catheterization on 12/09/2017. Widely patent LIMA-LAD. Patent SVG-OM2 with 80% stenosis in OM2 just distal to anastomosis.  There is also moderate ectasia of the distal portion of the SVG. Occluded SVG-rPDA, which does not appear acute but is new since last catheterization on 12/09/2017. Normal left heart, right heart, and pulmonary artery  pressures. Normal Fick cardiac output/index. Small left radial artery, not well-suited for catheterization; successful left heart catheterization via the left ulnar artery.   Recommendations: Escalate antianginal therapy as tolerated.  We will increase isosorbide mononitrate to 120 mg daily and continue current dose of carvedilol. Aggressive secondary prevention of coronary artery disease; smoking cessation reinforced.   Yvonne Kendall, MD [facility]  Coronary Diagrams  Diagnostic Dominance: Right  Intervention  Echo 05/2022 1. Left ventricular ejection fraction, by estimation, is 55 to 60%. Left  ventricular ejection fraction by 2D MOD biplane is 56.9 %. The left  ventricle has normal function. The left ventricle has no regional wall  motion abnormalities. Left ventricular  diastolic parameters were normal. The average left ventricular global  longitudinal strain is -19.0 %. The global longitudinal strain is normal.   2. Right ventricular systolic function is normal. The right ventricular  size is normal.   3. The mitral valve is normal in structure. Trivial mitral valve  regurgitation.   4. The aortic valve is tricuspid. Aortic valve regurgitation is not  visualized.   5. The inferior vena cava is normal in size with greater than 50%  respiratory variability, suggesting right atrial pressure of 3 mmHg.        Physical Exam:   VS:  BP 124/76   Pulse 63   Ht 5\' 7"  (1.702 m)   Wt 157 lb (71.2 kg)   SpO2 96%   BMI 24.59 kg/m    Wt Readings from Last 3 Encounters:  02/04/24 157 lb (71.2 kg)  01/19/24 157 lb (71.2 kg)  12/21/23 165 lb 2 oz (74.9 kg)    GEN: Well nourished, well developed in no acute distress NECK: No JVD; No carotid bruits CARDIAC: RRR, no murmurs, rubs, gallops RESPIRATORY: + rhonchi  ABDOMEN: Soft, non-tender, non-distended EXTREMITIES:  No edema; No deformity   ASSESSMENT AND PLAN: .    CAD s/p CABG She denies anginal symptoms. She was  treated for PNA in February. Last heart cath was 09/2022 with no intervention performed (reports above). RHC showed normal pressures. No further ischemic work-up at this time. Continue ASA, Lipitor, Coreg, Zetia, Imdur and Losartan.   Acute anemia Hgb 10.7>8.8 she denies bleeding. She is on ASA. I will re-check a CBC. She has follow-up with PCP next week.  HTN She says she has only been taking hydrochlorothiazide and Coreg. I will sotp hydrochlorothiazide. I will restart Losartan 12.5mg  daily and Imdur 15mg  daily.   HLD I will re-check lipids today. Continue Lipitor 80mg  daily and Zetia 10mg  daily.    COPD Tobacco use She is still smoking. She follow with pulmonology.       Dispo: Follow-up in 6 months  Signed, [place]nee Higham David Stall, PA-C

## 2024-02-04 NOTE — Patient Instructions (Signed)
 Medication Instructions:   Stop Hydrochlorothiazide  Start Losartan 12.5 MG daily  Start Imdur 15 MG daily  *If you need a refill on your cardiac medications before your next appointment, please call your pharmacy*  Lab Work: Your provider would like for you to have following labs drawn today CBC, Lipid and Direct LDL.   If you have labs (blood work) drawn today and your tests are completely normal, you will receive your results only by: MyChart Message (if you have MyChart) OR A paper copy in the mail If you have any lab test that is abnormal or we need to change your treatment, we will call you to review the results.  Testing/Procedures: None ordered.   Follow-Up: At [facility], you and your health needs are our priority.  As part of our continuing mission to provide you with exceptional heart care, our providers are all part of one team.  This team includes your primary Cardiologist (physician) and Advanced Practice Providers or APPs (Physician Assistants and Nurse Practitioners) who all work together to provide you with the care you need, when you need it.  Your next appointment:   6 month(s)  Provider:   You may see Yvonne Kendall, MD or one of the following Advanced Practice Providers on your designated Care Team:   Nicolasa Ducking, NP Ames Dura, PA-C Eula Listen, PA-C Cadence Wales, PA-C Charlsie Quest, NP Carlos Levering, NP    We recommend signing up for the patient portal called "MyChart".  Sign up information is provided on this After Visit Summary.  MyChart is used to connect with patients for Virtual Visits (Telemedicine).  Patients are able to view lab/test results, encounter notes, upcoming appointments, etc.  Non-urgent messages can be sent to your provider as well.   To learn more about what you can do with MyChart, go to [email or web address].

## 2024-02-05 LAB — LIPID PANEL
Chol/HDL Ratio: 2.7 ratio (ref 0.0–4.4)
Cholesterol, Total: 107 mg/dL (ref 100–199)
HDL: 39 mg/dL — ABNORMAL LOW (ref >39)
LDL Chol Calc (NIH): 51 mg/dL (ref 0–99)
Triglycerides: 83 mg/dL (ref 0–149)
VLDL Cholesterol Cal: 17 mg/dL (ref 5–40)

## 2024-02-05 LAB — CBC
Hematocrit: 35.1 % (ref 34.0–46.6)
Hemoglobin: 11 g/dL — ABNORMAL LOW (ref 11.1–15.9)
MCH: 27.8 pg (ref 26.6–33.0)
MCHC: 31.3 g/dL — ABNORMAL LOW (ref 31.5–35.7)
MCV: 89 fL (ref 79–97)
Platelets: 501 10*3/uL — ABNORMAL HIGH (ref 150–450)
RBC: 3.96 x10E6/uL (ref 3.77–5.28)
RDW: 13.3 % (ref 11.7–15.4)
WBC: 8.1 10*3/uL (ref 3.4–10.8)

## 2024-02-05 LAB — LDL CHOLESTEROL, DIRECT: LDL Direct: 52 mg/dL (ref 0–99)

## 2024-02-17 ENCOUNTER — Ambulatory Visit

## 2024-02-27 ENCOUNTER — Ambulatory Visit
Admission: RE | Admit: 2024-02-27 | Discharge: 2024-02-27 | Disposition: A | Discharge status: Home / Self Care | Source: Ambulatory Visit | Attending: Pulmonary Disease

## 2024-02-27 DIAGNOSIS — J189 Pneumonia, unspecified organism: Secondary | ICD-10-CM | POA: Insufficient documentation

## 2024-02-29 ENCOUNTER — Other Ambulatory Visit: Payer: Self-pay

## 2024-02-29 ENCOUNTER — Encounter: Payer: Self-pay | Admitting: Emergency Medicine

## 2024-02-29 ENCOUNTER — Emergency Department
Admission: EM | Admit: 2024-02-29 | Discharge: 2024-02-29 | Disposition: A | Discharge status: Home / Self Care | Attending: Emergency Medicine | Admitting: Emergency Medicine

## 2024-02-29 ENCOUNTER — Emergency Department

## 2024-02-29 DIAGNOSIS — M7712 Lateral epicondylitis, left elbow: Secondary | ICD-10-CM | POA: Diagnosis not present

## 2024-02-29 DIAGNOSIS — I2510 Atherosclerotic heart disease of native coronary artery without angina pectoris: Secondary | ICD-10-CM | POA: Diagnosis not present

## 2024-02-29 DIAGNOSIS — Z7982 Long term (current) use of aspirin: Secondary | ICD-10-CM | POA: Insufficient documentation

## 2024-02-29 DIAGNOSIS — M25522 Pain in left elbow: Secondary | ICD-10-CM | POA: Diagnosis present

## 2024-02-29 MED ORDER — KETOROLAC TROMETHAMINE 30 MG/ML IJ SOLN
30.0000 mg | Freq: Once | INTRAMUSCULAR | Status: AC
  Administered 2024-02-29: 30 mg via INTRAMUSCULAR
  Filled 2024-02-29: qty 1

## 2024-02-29 MED ORDER — ACETAMINOPHEN 500 MG PO TABS
1000.0000 mg | ORAL_TABLET | Freq: Once | ORAL | Status: AC
  Administered 2024-02-29: 1000 mg via ORAL
  Filled 2024-02-29: qty 2

## 2024-02-29 MED ORDER — OXYCODONE HCL 5 MG PO TABS
5.0000 mg | ORAL_TABLET | Freq: Once | ORAL | Status: AC
  Administered 2024-02-29: 5 mg via ORAL
  Filled 2024-02-29: qty 1

## 2024-02-29 MED ORDER — OXYCODONE HCL 5 MG PO TABS: 5.0000 mg | ORAL_TABLET | Freq: Four times a day (QID) | ORAL | 0 refills | Status: AC | PRN

## 2024-02-29 MED ORDER — IBUPROFEN 400 MG PO TABS: 400.0000 mg | ORAL_TABLET | Freq: Three times a day (TID) | ORAL | 0 refills | Status: AC | PRN

## 2024-02-29 NOTE — ED Provider Notes (Signed)
 [facility] Provider Note    Event Date/Time   First MD Initiated Contact with Patient 02/29/24 [phone]     (approximate)   History   Elbow Pain   HPI   Caitlyn Herrera is a 63 y.o. female who has known coronary disease who is on aspirin  only who comes in with left elbow pain.  Patient reports intermittent history of left elbow pain for years.  States that sometimes the pain comes on when the weather changes or she uses it too much.  However over the past 2 weeks she has had increasing pain in the lateral aspect of the left elbow.  She reports increasing pain with certain movements and with palpation over the area.  She has not taken anything today for the pain but does report trying some Tylenol  and ibuprofen  occasionally to try to help with the pain.  She denies any falls and hitting it.  She denies any pain in her chest or shortness of breath.   Patient underwent catheterization on 10/08/2022 where patient has known cardiac disease but no stents were placed given it appears similar to 2019     Physical Exam   Triage Vital Signs: ED Triage Vitals  Encounter Vitals Group     BP 02/29/24 1613 (!) 153/70     Systolic BP Percentile --      Diastolic BP Percentile --      Pulse Rate 02/29/24 1613 66     Resp 02/29/24 1613 17     Temp 02/29/24 1613 97.8 F (36.6 C)     Temp Source 02/29/24 1613 Oral     SpO2 02/29/24 1613 100 %     Weight 02/29/24 1612 156 lb 8.4 oz (71 kg)     Height 02/29/24 1612 5\' 7"  (1.702 m)     Head Circumference --      Peak Flow --      Pain Score 02/29/24 1612 10     Pain Loc --      Pain Education --      Exclude from Growth Chart --     Most recent vital signs: Vitals:   02/29/24 1613  BP: (!) 153/70  Pulse: 66  Resp: 17  Temp: 97.8 F (36.6 C)  SpO2: 100%     General: Awake, no distress.  CV:  Good peripheral perfusion.  Resp:  Normal effort.  Abd:  No distention.  Other:  Patient has no redness, no  warmth of the joints and a good distal pulse.  Her nails are painted to difficult to see cap refill but hand feels warm and well-perfused.  She is got significant tenderness over the lateral epicondyle.  She reports worsening pain with certain movements.  Pain is worse with extension and supination.   ED Results / Procedures / Treatments   Labs (all labs ordered are listed, but only abnormal results are displayed) Labs Reviewed - No data to display   RADIOLOGY I have reviewed the xray personally and interpreted    PROCEDURES:  Critical Care performed: No  Procedures   MEDICATIONS ORDERED IN ED: Medications  oxyCODONE  (Oxy IR/ROXICODONE ) immediate release tablet 5 mg (has no administration in time range)  ketorolac  (TORADOL ) 30 MG/ML injection 30 mg (has no administration in time range)     IMPRESSION / MDM / ASSESSMENT AND PLAN / ED COURSE  I reviewed the triage vital signs and the nursing notes.   Patient's presentation is most consistent with acute,  uncomplicated illness.   Suspect this is most likely a lateral epicondylitis or other tendinitis.  No significant swelling of the elbow no redness no warmth I do not feel that this is a septic joint no evidence of obvious elbow bursitis.  X-ray ordered to make sure no bony lesion, fracture.  I considered the possibility of cardiac pathology given she has a significant cardiac history but this been going on for 2 weeks and she denies any chest pain, shortness of breath.  She has point tenderness and significant pain with palpation of the elbow joint so this seems consistent with elbow pathology.  Patient is feels better after medications.  X-rays negative.  Discussed with her that I suspect this is most likely lateral epicondylitis.  We discussed RICE, ice, NSAIDs.  Will prescribe a few oxycodone  for breakthrough pain but she understands the risks of these and understands not to drive on them.  She did want a sling for comfort but  understands that there are risks of this and that she needs to take her arm out of it and do not allow the elbow to get frozen.  She is going to call the orthopedic doctor to make follow-up appointment.  Given pt on aspirin  and SSRI will do short course of reduced dose NSAID of 400mg  q8. Discussed PPI use which she is already on and returning for black stools bleeding issues.    FINAL CLINICAL IMPRESSION(S) / ED DIAGNOSES   Final diagnoses:  Lateral epicondylitis of left elbow     Rx / DC Orders   ED Discharge Orders     None        Note:  This document was prepared using Dragon voice recognition software and may include unintentional dictation errors.   Lubertha Rush, MD 02/29/24 1728

## 2024-02-29 NOTE — Discharge Instructions (Addendum)
 The sling is only temporary to use for the next 1 to 2 days while the ibuprofen  starts to work.  We do not want to develop a frozen elbow and support that you take it out of the sling and move it.  You can take Tylenol  1 g every 8 hours with the ibuprofen .  There is a small risk for bleeding given you are on aspirin  so make sure that you are taking it with food.  If you develop any black tarry stools then discontinue the medication and return to the ER immediately.  We will prescribe a few oxycodone  to use for breakthrough pain but these do have risk to them.  Please call the orthopedic doctor to make a follow-up appointment  Take oxycodone  as prescribed. Do not drink alcohol, drive or participate in any other potentially dangerous activities while taking this medication as it may make you sleepy. Do not take this medication with any other sedating medications, either prescription or over-the-counter. If you were prescribed Percocet or Vicodin, do not take these with acetaminophen  (Tylenol ) as it is already contained within these medications.  This medication is an opiate (or narcotic) pain medication and can be habit forming. Use it as little as possible to achieve adequate pain control. Do not use or use it with extreme caution if you have a history of opiate abuse or dependence. If you are on a pain contract with your primary care doctor or a pain specialist, be sure to let them know you were prescribed this medication today from the Emergency Department. This medication is intended for your use only - do not give any to anyone else and keep it in a secure place where nobody else, especially children, have access to it.

## 2024-02-29 NOTE — ED Triage Notes (Signed)
 Patient to ED via POV for left elbow pain. States pain ongoing x2 weeks. Denies injury.

## 2024-02-29 NOTE — ED Notes (Signed)
 Shoulder immobilizer applied to L arm

## 2024-03-02 ENCOUNTER — Ambulatory Visit: Admitting: Pulmonary Disease

## 2024-03-12 ENCOUNTER — Telehealth: Payer: Self-pay

## 2024-03-12 NOTE — Telephone Encounter (Signed)
 Dr. Viva Grise can see the patient Tuesday (5/20).  ATC the patient but my call could not be completed. I will try again later.    E2C2 please advise when patient calls back.

## 2024-03-12 NOTE — Telephone Encounter (Signed)
 Please get patient in next week with me so we can discuss findings and next steps.

## 2024-03-12 NOTE — Telephone Encounter (Signed)
 Copied from CRM [id number]. Topic: Clinical - Lab/Test Results >> Mar 12, 2024  9:36 AM Isabell A wrote: Reason for CRM: Connell Degree from [facility] calling in regard to relaying results to MA or PA.   Attempt to call CAL, no answer.   Callback number: [phone]

## 2024-03-12 NOTE — Telephone Encounter (Addendum)
Dr. Gonzalez please advise. 

## 2024-03-15 NOTE — Telephone Encounter (Signed)
 I Spoke with the patient and scheduled her an appt for tomorrow at 1:15pm.  Nothing further needed.

## 2024-03-16 ENCOUNTER — Telehealth: Payer: Self-pay

## 2024-03-16 ENCOUNTER — Ambulatory Visit (INDEPENDENT_AMBULATORY_CARE_PROVIDER_SITE_OTHER): Admitting: Pulmonary Disease

## 2024-03-16 ENCOUNTER — Encounter: Payer: Self-pay | Admitting: Pulmonary Disease

## 2024-03-16 VITALS — BP 130/74 | HR 66 | Temp 97.6°F | Ht 67.0 in | Wt 161.2 lb

## 2024-03-16 DIAGNOSIS — J4489 Other specified chronic obstructive pulmonary disease: Secondary | ICD-10-CM

## 2024-03-16 DIAGNOSIS — F17210 Nicotine dependence, cigarettes, uncomplicated: Secondary | ICD-10-CM | POA: Diagnosis not present

## 2024-03-16 DIAGNOSIS — J9809 Other diseases of bronchus, not elsewhere classified: Secondary | ICD-10-CM | POA: Diagnosis not present

## 2024-03-16 MED ORDER — TRELEGY ELLIPTA 200-62.5-25 MCG/ACT IN AEPB: 1.0000 | INHALATION_SPRAY | Freq: Every day | RESPIRATORY_TRACT | Status: DC

## 2024-03-16 MED ORDER — ALBUTEROL SULFATE HFA 108 (90 BASE) MCG/ACT IN AERS: 2.0000 | INHALATION_SPRAY | Freq: Four times a day (QID) | RESPIRATORY_TRACT | 2 refills | Status: AC | PRN

## 2024-03-16 MED ORDER — TRELEGY ELLIPTA 200-62.5-25 MCG/ACT IN AEPB: 1.0000 | INHALATION_SPRAY | Freq: Every day | RESPIRATORY_TRACT | 11 refills | Status: AC

## 2024-03-16 NOTE — Progress Notes (Unsigned)
 Subjective:    Patient ID: Caitlyn Herrera, female    DOB: 09/20/1961, 63 y.o.   MRN: [id number]  Patient Care Team: [facility] as PCP - General (General Practice) End, Veryl Gottron, MD as PCP - Cardiology (Cardiology) Marc Senior, MD as Consulting Physician (Pulmonary Disease)  Chief Complaint  Patient presents with   Follow-up    Cough, shortness of breath and wheezing.     BACKGROUND/INTERVAL:  HPI  DATA 01/09/2021 LDCT: Small scattered lung nodules largest in the right lung apex with an equivalent diameter of 3.6 mm.  No suspicious nodules identified.  Lung RADS 2, benign appearance or behavior.  Emphysema and aortic atherosclerosis. 06/10/2022 echocardiogram: LVEF 55 to 60%, normal LV function, normal diastolic parameters.  RV function normal.  RV size normal.  Trivial mitral valve regurgitation. 09/16/2022 chest x-ray PA and lateral: No active cardiopulmonary disease. 10/08/2022 right and left heart cath: Severe native three-vessel coronary disease similar to 2019.  Widely patent LIMA to LAD.  Patent SVG-OM 2 with 80% stenosis in OM 2 distal to the anastomosis.  Moderate ectasia of the distal portion of the SVG.  Noted SVG-rPDA new since 2019 but does not appear acute.  Normal left heart, right heart and pulmonary artery pressures.  Normal Fick cardiac output/index. 10/24/2022 PFTs: FEV1 1.44 L or 52% predicted, FVC 1.76 L or 48% predicted, FEV1/FVC 82%, there was significant bronchodilator response.  Lung volumes were moderately reduced.  Diffusion capacity moderately reduced but corrects to normal by alveolar volume.  Consistent with obstructive airways disease with reversibility and moderate restriction either due to chest wall or interstitial changes.  Review of Systems A 10 point review of systems was performed and it is as noted above otherwise negative.   Patient Active Problem List   Diagnosis Date Noted   Hypophosphatemia 12/20/2023    Diabetes mellitus without complication (HCC) 12/20/2023   CAP (community acquired pneumonia) 12/19/2023   Sepsis (HCC) 12/19/2023   CAD (coronary artery disease) 12/19/2023   Chronic diastolic CHF (congestive heart failure) (HCC) 12/19/2023   Hypokalemia 12/19/2023   Nausea vomiting and diarrhea 12/19/2023   Lung mass_mass like 12/19/2023   Pulmonary hypertension, unspecified (HCC) 10/08/2022   Fatigue 05/08/2022   Ischemic cardiomyopathy 11/08/2021   Coronary artery disease of native artery of native heart with stable angina pectoris (HCC) 02/22/2019   Chronic cough 05/28/2018   Shortness of breath 03/26/2018   Essential hypertension 03/26/2018   Palpitations 03/26/2018   Chest pressure 12/08/2017   Unstable angina (HCC) 12/08/2017   Angina at rest [facility])    Hyperlipidemia LDL goal <70    S/P CABG (coronary artery bypass graft) 11/12/2017   Non-insulin  dependent type 2 diabetes mellitus (HCC) 11/10/2017   3-vessel CAD 11/10/2017   HCVD (hypertensive cardiovascular disease) 11/10/2017   UTI (urinary tract infection) 11/10/2017   Pulmonary nodule, right 11/10/2017   Hyperlipidemia associated with type 2 diabetes mellitus (HCC) 11/07/2017   Tobacco abuse    NSTEMI (non-ST elevated myocardial infarction) (HCC) 11/05/2017    Social History   Tobacco Use   Smoking status: Every Day    Current packs/day: 0.50    Average packs/day: 0.5 packs/day for 45.4 years (22.7 ttl pk-yrs)    Types: Cigars, Cigarettes    Start date: 1980   Smokeless tobacco: Never   Tobacco comments:    5 cigarettes a day- khj 01/19/2024. Smoking some days 4 cigarettes a day. 03/16/2024. sd  Substance Use Topics   Alcohol use:  Not Currently    Comment: occassionally    No Known Allergies  Current Meds  Medication Sig   aspirin  EC 81 MG tablet Take 1 tablet (81 mg total) by mouth daily.   atorvastatin  (LIPITOR ) 80 MG tablet TAKE 1 TABLET (80 MG TOTAL) BY MOUTH DAILY.   azelastine (ASTELIN) 0.1 %  nasal spray Place 2 sprays into both nostrils as needed.   benzonatate  (TESSALON ) 100 MG capsule Take 1 capsule (100 mg total) by mouth 2 (two) times daily as needed for cough.   carvedilol  (COREG ) 12.5 MG tablet Take 1 tablet (12.5 mg total) by mouth 2 (two) times daily.   cetirizine (ZYRTEC) 10 MG tablet Take 10 mg by mouth daily.   citalopram  (CELEXA ) 40 MG tablet Take 60 mg by mouth daily.   cyclobenzaprine (FLEXERIL) 10 MG tablet as needed.   ezetimibe  (ZETIA ) 10 MG tablet TAKE 1 TABLET(10 MG) BY MOUTH DAILY   fluticasone  (FLONASE) 50 MCG/ACT nasal spray as needed.   gabapentin  (NEURONTIN ) 300 MG capsule Take 300 mg by mouth daily. Has not started.   isosorbide  mononitrate (IMDUR ) 30 MG 24 hr tablet Take 0.5 tablets (15 mg total) by mouth daily.   lidocaine  (XYLOCAINE ) 5 % ointment Apply 1 application topically daily as needed.   losartan  (COZAAR ) 25 MG tablet Take 0.5 tablets (12.5 mg total) by mouth daily.   meloxicam (MOBIC) 15 MG tablet Take 15 mg by mouth daily.   nicotine  (NICODERM CQ  - DOSED IN MG/24 HOURS) 14 mg/24hr patch Place 1 patch (14 mg total) onto the skin daily.   nitroGLYCERIN  (NITROSTAT ) 0.4 MG SL tablet DISSOLVE 1 TABLET UNDER THE TONGUE AS NEEDED FOR CHEST PAIN EVERY 5 MINUTES UP TO 3 TIMES. IF NO RELIEF CALL 911.   omeprazole  (PRILOSEC) 40 MG capsule TAKE 1 CAPSULE BY MOUTH EVERY DAY BEFORE BREAKFAST   ondansetron  (ZOFRAN ) 4 MG tablet Take 1 tablet (4 mg total) by mouth every 8 (eight) hours as needed for nausea or vomiting.   SitaGLIPtin-MetFORMIN  HCl (JANUMET XR) 50-1000 MG TB24 Take 1 tablet by mouth daily.   tiZANidine (ZANAFLEX) 2 MG tablet Take 2 mg by mouth 3 (three) times daily.   [DISCONTINUED] Fluticasone -Umeclidin-Vilant (TRELEGY ELLIPTA ) 200-62.5-25 MCG/ACT AEPB Inhale 1 puff into the lungs daily.    Immunization History  Administered Date(s) Administered   Fluad Trivalent(High Dose 65+) 12/21/2023   Influenza-Unspecified 09/30/2019, 12/11/2020,  08/28/2022   Moderna SARS-COV2 Booster Vaccination 12/11/2020   Moderna Sars-Covid-2 Vaccination 04/19/2020, 05/17/2020        Objective:     BP 130/74 (BP Location: Left Arm, Patient Position: Sitting, Cuff Size: Normal)   Pulse 66   Temp 97.6 F (36.4 C) (Temporal)   Ht 5\' 7"  (1.702 m)   Wt 161 lb 3.2 oz (73.1 kg)   SpO2 100%   BMI 25.25 kg/m   SpO2: 100 %  GENERAL: Well-developed, well-nourished woman, no acute distress, fully ambulatory.  No conversational dyspnea HEAD: Normocephalic, atraumatic.  EYES: Pupils equal, round, reactive to light.  No scleral icterus.  MOUTH: Edentulous, wears dentures.  Oral mucosa moist.  No thrush. NECK: Supple. No thyromegaly. Trachea midline. No JVD.  No adenopathy. PULMONARY: Good air entry bilaterally.  Generalized wheezing noted particularly on the left lower lung field. CARDIOVASCULAR: S1 and S2. Regular rate and rhythm.  No rubs, murmurs or gallops heard. ABDOMEN: Obese otherwise benign. MUSCULOSKELETAL: No joint deformity, no clubbing, no edema.  NEUROLOGIC: No focal deficit, no gait disturbance, speech is fluent. SKIN: Intact,warm,dry.  PSYCH: Mood and behavior normal.        Assessment & Plan:     ICD-10-CM   1. Bronchial obstruction  J98.09     2. COPD with asthma (HCC)  J44.89     3. Tobacco dependence due to cigarettes  F17.210       Meds ordered this encounter  Medications   Fluticasone -Umeclidin-Vilant (TRELEGY ELLIPTA ) 200-62.5-25 MCG/ACT AEPB    Sig: Inhale 1 puff into the lungs daily.    Dispense:  60 each    Refill:  11    Lot Number?:   [id number]    Expiration Date?:   07/28/2024    Quantity:   1   albuterol  (VENTOLIN  HFA) 108 (90 Base) MCG/ACT inhaler    Sig: Inhale 2 puffs into the lungs every 6 (six) hours as needed for wheezing or shortness of breath.    Dispense:  8 g    Refill:  2       Advised if symptoms do not improve or worsen, to please contact office for sooner follow up or seek emergency  care.    I spent xxx minutes of dedicated to the care of this patient on the date of this encounter to include pre-visit review of records, face-to-face time with the patient discussing conditions above, post visit ordering of testing, clinical documentation with the electronic health record, making appropriate referrals as documented, and communicating necessary findings to members of the patients care team.     C. Chloe Counter, MD Advanced Bronchoscopy [facility]    *This note was generated using voice recognition software/Dragon and/or AI transcription program.  Despite best efforts to proofread, errors can occur which can change the meaning. Any transcriptional errors that result from this process are unintentional and may not be fully corrected at the time of dictation.

## 2024-03-16 NOTE — Telephone Encounter (Signed)
 For the codes 82956, [id number], [phone] Prior Auth Not Required Refer # [id number]

## 2024-03-16 NOTE — Telephone Encounter (Signed)
 The patient is aware of date and time. Bronch email has been sent.

## 2024-03-16 NOTE — H&P (View-Only) (Signed)
 Subjective:    Patient ID: Caitlyn Herrera, female    DOB: [birth year], 62 y.o.   MRN: [id number]  Patient Care Team: Center, [facility] as PCP - General (General Practice) End, Veryl Gottron, MD as PCP - Cardiology (Cardiology) Marc Senior, MD as Consulting Physician (Pulmonary Disease)  Chief Complaint  Patient presents with   Follow-up    Cough, shortness of breath and wheezing.     BACKGROUND/INTERVAL: Patient is a 63 year old current smoker last seen on 18 Mar 2023 following for the issue of COPD with asthma overlap.  The patient had previously been enrolled in lung cancer screening program but then dropped out of the program on her own.  Last CT chest was 19 December 2023 (CT angio chest) at that time showing infrahilar masslike fullness and narrowing of the left lower lobe bronchus concerning for malignancy. Subsequently was admitted to [facility] and admitted for streptococcal pneumonia at that time she was then seen by my colleague Dr. Lucina Sabal who ordered a repeat CT after treatment for pneumonia.  She presents now for next steps after review of the CT.  HPI Discussed the use of AI scribe software for clinical note transcription with the patient, who gave verbal consent to proceed.  History of Present Illness   Caitlyn Herrera is a 63 year old female with asthma who presents for persistent collapse of the left lower lobe to rule out mass.  She has a history of persistent collapse of the left lower lobe, initially identified during a follow-up CT scan after a bout of pneumonia. The CT scan showed a concerning area in the left lower lobe, prompting further investigation to rule out a mass.  She experiences shortness of breath and significant wheezing, particularly when climbing stairs or rushing around the house. These symptoms have been present since November, following a presumed viral illness. She is currently using Trelegy 100 mcg inhaler but does not have  the emergency inhaler.   Her medication regimen includes Trelegy 100 mcg, which she uses regularly. She experiences generalized wheezing.   She was last seen by me on 18 Mar 2023 at that time without acute exacerbation and asked to follow-up in 6 to 8 weeks which she did not do.    DATA 01/09/2021 LDCT: Small scattered lung nodules largest in the right lung apex with an equivalent diameter of 3.6 mm.  No suspicious nodules identified.  Lung RADS 2, benign appearance or behavior.  Emphysema and aortic atherosclerosis. 06/10/2022 echocardiogram: LVEF 55 to 60%, normal LV function, normal diastolic parameters.  RV function normal.  RV size normal.  Trivial mitral valve regurgitation. 09/16/2022 chest x-ray PA and lateral: No active cardiopulmonary disease. 10/08/2022 right and left heart cath: Severe native three-vessel coronary disease similar to 2019.  Widely patent LIMA to LAD.  Patent SVG-OM 2 with 80% stenosis in OM 2 distal to the anastomosis.  Moderate ectasia of the distal portion of the SVG.  Noted SVG-rPDA new since 2019 but does not appear acute.  Normal left heart, right heart and pulmonary artery pressures.  Normal Fick cardiac output/index. 10/24/2022 PFTs: FEV1 1.44 L or 52% predicted, FVC 1.76 L or 48% predicted, FEV1/FVC 82%, there was significant bronchodilator response.  Lung volumes were moderately reduced.  Diffusion capacity moderately reduced but corrects to normal by alveolar volume.  Consistent with obstructive airways disease with reversibility and moderate restriction either due to chest wall or interstitial changes.  Review of Systems A 10 point review of  systems was performed and it is as noted above otherwise negative.   Patient Active Problem List   Diagnosis Date Noted   Hypophosphatemia 12/20/2023   Diabetes mellitus without complication (HCC) 12/20/2023   CAP (community acquired pneumonia) 12/19/2023   Sepsis (HCC) 12/19/2023   CAD (coronary artery disease)  12/19/2023   Chronic diastolic CHF (congestive heart failure) (HCC) 12/19/2023   Hypokalemia 12/19/2023   Nausea vomiting and diarrhea 12/19/2023   Lung mass_mass like 12/19/2023   Pulmonary hypertension, unspecified (HCC) 10/08/2022   Fatigue 05/08/2022   Ischemic cardiomyopathy 11/08/2021   Coronary artery disease of native artery of native heart with stable angina pectoris (HCC) 02/22/2019   Chronic cough 05/28/2018   Shortness of breath 03/26/2018   Essential hypertension 03/26/2018   Palpitations 03/26/2018   Chest pressure 12/08/2017   Unstable angina (HCC) 12/08/2017   Angina at rest [facility])    Hyperlipidemia LDL goal <70    S/P CABG (coronary artery bypass graft) 11/12/2017   Non-insulin  dependent type 2 diabetes mellitus (HCC) 11/10/2017   3-vessel CAD 11/10/2017   HCVD (hypertensive cardiovascular disease) 11/10/2017   UTI (urinary tract infection) 11/10/2017   Pulmonary nodule, right 11/10/2017   Hyperlipidemia associated with type 2 diabetes mellitus (HCC) 11/07/2017   Tobacco abuse    NSTEMI (non-ST elevated myocardial infarction) (HCC) 11/05/2017    Social History   Tobacco Use   Smoking status: Every Day    Current packs/day: 0.50    Average packs/day: 0.5 packs/day for 45.4 years (22.7 ttl pk-yrs)    Types: Cigars, Cigarettes    Start date: 1980   Smokeless tobacco: Never   Tobacco comments:    5 cigarettes a day- khj 01/19/2024. Smoking some days 4 cigarettes a day. 03/16/2024. sd  Substance Use Topics   Alcohol use: Not Currently    Comment: occassionally    No Known Allergies  Current Meds  Medication Sig   aspirin  EC 81 MG tablet Take 1 tablet (81 mg total) by mouth daily.   atorvastatin  (LIPITOR ) 80 MG tablet TAKE 1 TABLET (80 MG TOTAL) BY MOUTH DAILY.   azelastine (ASTELIN) 0.1 % nasal spray Place 2 sprays into both nostrils as needed.   benzonatate  (TESSALON ) 100 MG capsule Take 1 capsule (100 mg total) by mouth 2 (two) times daily as needed for  cough.   carvedilol  (COREG ) 12.5 MG tablet Take 1 tablet (12.5 mg total) by mouth 2 (two) times daily.   cetirizine (ZYRTEC) 10 MG tablet Take 10 mg by mouth daily.   citalopram  (CELEXA ) 40 MG tablet Take 60 mg by mouth daily.   cyclobenzaprine (FLEXERIL) 10 MG tablet as needed.   ezetimibe  (ZETIA ) 10 MG tablet TAKE 1 TABLET(10 MG) BY MOUTH DAILY   fluticasone  (FLONASE) 50 MCG/ACT nasal spray as needed.   gabapentin  (NEURONTIN ) 300 MG capsule Take 300 mg by mouth daily. Has not started.   isosorbide  mononitrate (IMDUR ) 30 MG 24 hr tablet Take 0.5 tablets (15 mg total) by mouth daily.   lidocaine  (XYLOCAINE ) 5 % ointment Apply 1 application topically daily as needed.   losartan  (COZAAR ) 25 MG tablet Take 0.5 tablets (12.5 mg total) by mouth daily.   meloxicam (MOBIC) 15 MG tablet Take 15 mg by mouth daily.   nicotine  (NICODERM CQ  - DOSED IN MG/24 HOURS) 14 mg/24hr patch Place 1 patch (14 mg total) onto the skin daily.   nitroGLYCERIN  (NITROSTAT ) 0.4 MG SL tablet DISSOLVE 1 TABLET UNDER THE TONGUE AS NEEDED FOR CHEST PAIN EVERY 5  MINUTES UP TO 3 TIMES. IF NO RELIEF CALL 911.   omeprazole  (PRILOSEC) 40 MG capsule TAKE 1 CAPSULE BY MOUTH EVERY DAY BEFORE BREAKFAST   ondansetron  (ZOFRAN ) 4 MG tablet Take 1 tablet (4 mg total) by mouth every 8 (eight) hours as needed for nausea or vomiting.   SitaGLIPtin-MetFORMIN  HCl (JANUMET XR) 50-1000 MG TB24 Take 1 tablet by mouth daily.   tiZANidine (ZANAFLEX) 2 MG tablet Take 2 mg by mouth 3 (three) times daily.   [DISCONTINUED] Fluticasone -Umeclidin-Vilant (TRELEGY ELLIPTA ) 200-62.5-25 MCG/ACT AEPB Inhale 1 puff into the lungs daily.    Immunization History  Administered Date(s) Administered   Fluad Trivalent(High Dose 65+) 12/21/2023   Influenza-Unspecified 09/30/2019, 12/11/2020, 08/28/2022   Moderna SARS-COV2 Booster Vaccination 12/11/2020   Moderna Sars-Covid-2 Vaccination 04/19/2020, 05/17/2020        Objective:     BP 130/74 (BP Location:  Left Arm, Patient Position: Sitting, Cuff Size: Normal)   Pulse 66   Temp 97.6 F (36.4 C) (Temporal)   Ht 5\' 7"  (1.702 m)   Wt 161 lb 3.2 oz (73.1 kg)   SpO2 100%   BMI 25.25 kg/m   SpO2: 100 %  GENERAL: Well-developed, well-nourished woman, no acute distress, fully ambulatory.  No conversational dyspnea HEAD: Normocephalic, atraumatic.  EYES: Pupils equal, round, reactive to light.  No scleral icterus.  MOUTH: Edentulous, wears dentures.  Oral mucosa moist.  No thrush. NECK: Supple. No thyromegaly. Trachea midline. No JVD.  No adenopathy. PULMONARY: Good air entry bilaterally.  Generalized wheezing noted particularly on the left lower lung field. CARDIOVASCULAR: S1 and S2. Regular rate and rhythm.  No rubs, murmurs or gallops heard. ABDOMEN: Obese otherwise benign. MUSCULOSKELETAL: No joint deformity, no clubbing, no edema.  NEUROLOGIC: No focal deficit, no gait disturbance, speech is fluent. SKIN: Intact,warm,dry. PSYCH: Mood and behavior normal.  Representative image from CT performed 19 December 2023 showing the left infrahilar mass effect this also unchanged on 27 Feb 2024 CT without contrast:    Assessment & Plan:     ICD-10-CM   1. Bronchial obstruction  J98.09     2. COPD with asthma (HCC)  J44.89     3. Tobacco dependence due to cigarettes  F17.210       Meds ordered this encounter  Medications   Fluticasone -Umeclidin-Vilant (TRELEGY ELLIPTA ) 200-62.5-25 MCG/ACT AEPB    Sig: Inhale 1 puff into the lungs daily.    Dispense:  60 each    Refill:  11    Lot Number?:   [id number]    Expiration Date?:   07/28/2024    Quantity:   1   albuterol  (VENTOLIN  HFA) 108 (90 Base) MCG/ACT inhaler    Sig: Inhale 2 puffs into the lungs every 6 (six) hours as needed for wheezing or shortness of breath.    Dispense:  8 g    Refill:  2    Discussion:    Persistent left lower lobe collapse Persistent collapse of the left lower lobe on follow-up CT scan. Differential diagnosis  includes residual pneumonia versus a mass. Further investigation is required to determine the cause. - Schedule bronchoscopy with EBUS on June 2nd at 12:15 PM to evaluate the left lower lobe lesion. - Consider PET CT scan to assess areas of involvement prior to bronchoscopy.  Asthma with wheezing Asthma with wheezing exacerbated by physical activity. Current inhaler therapy may need optimization due to persistent symptoms. She is currently on Trelegy 100, which will be increased to 200 to better manage symptoms. -  Refill rescue inhaler prescription. - Increase strength of current inhaler from 100 to 200. - Ensure she understands the use of the emergency inhaler for wheezing episodes.      Advised if symptoms do not improve or worsen, to please contact office for sooner follow up or seek emergency care.    I spent 40 minutes of dedicated to the care of this patient on the date of this encounter to include pre-visit review of records, face-to-face time with the patient discussing conditions above, post visit ordering of testing, clinical documentation with the electronic health record, making appropriate referrals as documented, and communicating necessary findings to members of the patients care team.     C. Chloe Counter, MD Advanced Bronchoscopy PCCM [facility]    *This note was generated using voice recognition software/Dragon and/or AI transcription program.  Despite best efforts to proofread, errors can occur which can change the meaning. Any transcriptional errors that result from this process are unintentional and may not be fully corrected at the time of dictation.

## 2024-03-16 NOTE — Telephone Encounter (Signed)
 Bronchoscopy with EBUS 03/29/2024 at 12:15pm Lung Nodule 31622, 31652, 31653  Caitlyn Herrera please see bronch info.

## 2024-03-16 NOTE — Patient Instructions (Addendum)
 VISIT SUMMARY:  You came in today to address the persistent collapse of your left lower lung lobe and your asthma symptoms. We discussed your recent CT scan findings and your ongoing symptoms of shortness of breath and wheezing, especially during physical activities.  YOUR PLAN:  -PERSISTENT LEFT LOWER LOBE COLLAPSE: This condition means that a part of your left lung is not inflating properly. We need to investigate further to determine if this is due to leftover pneumonia or a possible mass. You are scheduled for a bronchoscopy with EBUS on June 2nd at 12:15 PM to examine the area more closely. We may also consider a PET CT scan to get a better look at the involved areas after the bronchoscopy.  -ASTHMA WITH WHEEZING: Asthma is a condition where your airways become inflamed and narrow, causing wheezing and shortness of breath. Your symptoms seem to be worse with physical activity. We will increase the strength of your current inhaler from 100 to 200 to help manage your symptoms better. Additionally, we will refill your prescription for a rescue inhaler and ensure you understand how to use it during wheezing episodes.  INSTRUCTIONS:  Please make sure to attend your bronchoscopy with EBUS appointment on June 2nd at 12:15 PM. Continue using your Trelegy inhaler at the increased dose of 200 mcg and keep your rescue inhaler handy for wheezing episodes. If you have any questions or concerns, please contact our office.

## 2024-03-17 NOTE — Telephone Encounter (Signed)
 Noted. Nothing further needed.

## 2024-03-23 ENCOUNTER — Other Ambulatory Visit: Payer: Self-pay

## 2024-03-23 ENCOUNTER — Telehealth: Payer: Self-pay

## 2024-03-23 ENCOUNTER — Encounter
Admission: RE | Admit: 2024-03-23 | Discharge: 2024-03-23 | Disposition: A | Discharge status: Home / Self Care | Source: Ambulatory Visit | Attending: Pulmonary Disease | Admitting: Pulmonary Disease

## 2024-03-23 DIAGNOSIS — Z01818 Encounter for other preprocedural examination: Secondary | ICD-10-CM

## 2024-03-23 HISTORY — DX: Sepsis, unspecified organism: A41.9

## 2024-03-23 HISTORY — DX: Pulmonary hypertension, unspecified: I27.20

## 2024-03-23 HISTORY — DX: Thrombocytosis, unspecified: D75.839

## 2024-03-23 HISTORY — DX: Other forms of angina pectoris: I20.89

## 2024-03-23 HISTORY — DX: Anemia, unspecified: D64.9

## 2024-03-23 HISTORY — DX: Atherosclerosis of aorta: I70.0

## 2024-03-23 HISTORY — DX: Dyspnea, unspecified: R06.00

## 2024-03-23 HISTORY — DX: Other diseases of bronchus, not elsewhere classified: J98.09

## 2024-03-23 HISTORY — DX: Emphysema, unspecified: J43.9

## 2024-03-23 HISTORY — DX: Presence of aortocoronary bypass graft: Z95.1

## 2024-03-23 NOTE — Telephone Encounter (Signed)
-----   Message from Shirline Dover sent at 03/23/2024 12:21 PM EDT ----- Regarding: Request for pre-operative cardiac clearance Request for pre-operative cardiac clearance:  1. What type of surgery is being performed?  ENB/EBUS  2. When is this surgery scheduled?  03/29/2024  3. Type of clearance being requested (medical, pharmacy, both)? MEDICAL   4. Are there any medications that need to be held prior to surgery? N/A - patient to continue daily LOW DOSE ASPIRIN  throughout the perioperative course  5. Practice name and name of physician performing surgery?  Performing surgeon: Dr. Coralie Derrick, MD Requesting clearance: Renate Caroline, FNP-C    6. Anesthesia type (none, local, MAC, general)? GENERAL  7. What is the office phone and fax number?   Fax: [phone]  ATTENTION: Unable to create telephone message as per your standard workflow. Directed by [facility] providers to send requests for cardiac clearance to this pool for appropriate distribution to provider covering pre-operative clearances.   Renate Caroline, MSN, APRN, FNP-C, CEN [facility]  Peri-operative Services Nurse Practitioner Phone: [phone] 03/23/24 12:21 PM

## 2024-03-23 NOTE — Patient Instructions (Signed)
 Your procedure is scheduled on:03-29-24 Monday Report to the Registration Desk on the 1st floor of the Medical Mall.Then proceed to the 2nd floor Surgery Desk To find out your arrival time, please call [phone] between 1PM - 3PM on:03-26-24 Friday If your arrival time is 6:00 am, do not arrive before that time as the Medical Mall entrance doors do not open until 6:00 am.  REMEMBER: Instructions that are not followed completely may result in serious medical risk, up to and including death; or upon the discretion of your surgeon and anesthesiologist your surgery may need to be rescheduled.  Do not eat food OR drink any liquids after midnight the night before surgery.  No gum chewing or hard candies.  One week prior to surgery:Stop NOW (03-23-24) Stop Anti-inflammatories (NSAIDS) such as meloxicam (MOBIC), Advil , Aleve, Ibuprofen , Motrin , Naproxen, Naprosyn and Aspirin  based products such as Excedrin, Goody's Powder, BC Powder. Stop ANY OVER THE COUNTER supplements until after surgery.  You may however, continue to take Tylenol  if needed for pain up until the day of surgery.  Stop SitaGLIPtin-MetFORMIN  HCl (JANUMET XR) 2 days prior to surgery-Last dose will be on 03-26-34 Friday  Continue taking all of your other prescription medications up until the day of surgery.  ON THE DAY OF SURGERY ONLY TAKE THESE MEDICATIONS WITH SIPS OF WATER: -atorvastatin  (LIPITOR )  -carvedilol  (COREG )  -cetirizine (ZYRTEC)  -citalopram  (CELEXA ) -ezetimibe  (ZETIA )  -gabapentin  (NEURONTIN )  -isosorbide  mononitrate (IMDUR )  -omeprazole  (PRILOSEC)   Continue your 81 mg Aspirin  up until the day prior to surgery-Do NOT take the morning of surgery  Use your Albuterol  and Trelegy Ellipta  Inhaler the morning of surgery and bring your Albuterol  Inhaler to the hospital  No Alcohol for 24 hours before or after surgery.  No Smoking including e-cigarettes for 24 hours before surgery.  No chewable tobacco products  for at least 6 hours before surgery.  No nicotine  patches on the day of surgery.  Do not use any "recreational" drugs for at least a week (preferably 2 weeks) before your surgery.  Please be advised that the combination of cocaine and anesthesia may have negative outcomes, up to and including death. If you test positive for cocaine, your surgery will be cancelled.  On the morning of surgery brush your teeth with toothpaste and water, you may rinse your mouth with mouthwash if you wish. Do not swallow any toothpaste or mouthwash.  Do not wear jewelry, make-up, hairpins, clips or nail polish.  For welded (permanent) jewelry: bracelets, anklets, waist bands, etc.  Please have this removed prior to surgery.  If it is not removed, there is a chance that hospital personnel will need to cut it off on the day of surgery.  Do not wear lotions, powders, or perfumes.   Do not shave body hair from the neck down 48 hours before surgery.  Contact lenses, hearing aids and dentures may not be worn into surgery.  Do not bring valuables to the hospital. [facility] is not responsible for any missing/lost belongings or valuables.   Notify your doctor if there is any change in your medical condition (cold, fever, infection).  Wear comfortable clothing (specific to your surgery type) to the hospital.  After surgery, you can help prevent lung complications by doing breathing exercises.  Take deep breaths and cough every 1-2 hours. Your doctor may order a device called an Incentive Spirometer to help you take deep breaths. When coughing or sneezing, hold a pillow firmly against your incision with  both hands. This is called "splinting." Doing this helps protect your incision. It also decreases belly discomfort.  If you are being admitted to the hospital overnight, leave your suitcase in the car. After surgery it may be brought to your room.  In case of increased patient census, it may be necessary for you,  the patient, to continue your postoperative care in the Same Day Surgery department.  If you are being discharged the day of surgery, you will not be allowed to drive home. You will need a responsible individual to drive you home and stay with you for 24 hours after surgery.   If you are taking public transportation, you will need to have a responsible individual with you.  Please call the Pre-admissions Testing Dept. at [phone] if you have any questions about these instructions.  Surgery Visitation Policy:  Patients having surgery or a procedure may have two visitors.  Children under the age of 107 must have an adult with them who is not the patient.  Aaron Aas

## 2024-03-23 NOTE — Telephone Encounter (Signed)
 Hi Cadence,  We have received a surgical clearance request for Caitlyn Herrera for ENB/EBUS procedure. They were seen recently in clinic on/9/25. Can you please comment on surgical clearance for upcoming procedure. Please forward you guidance and recommendations to P CV DIV PREOP   Thanks,  Charles Connor, NP

## 2024-03-23 NOTE — Telephone Encounter (Signed)
   Pre-operative Risk Assessment    Patient Name: Caitlyn Herrera  DOB: Sep 24, 1961 MRN: [id number]   Date of last office visit: 02/04/24 Toribio Frees, PA Date of next office visit: NONE   Request for Surgical Clearance    Procedure:  ENB/EBUS  Date of Surgery:  Clearance 03/29/24                                Surgeon:  DR Coralie Derrick, MD Surgeon's Group or Practice Name:  [facility] Phone number:  [phone] Fax number:  [phone]   Type of Clearance Requested:   - Medical  - Pharmacy:  Hold Aspirin  N/A- PATIENT TO CONTINUE DAILY LOW DOSE ASPIRIN  THROUGHOUT THE PREOPERATIVE COURSE PER REQUEST   Type of Anesthesia:  General    Additional requests/questions:    SignedCollin Deal   03/23/2024, 1:57 PM

## 2024-03-23 NOTE — Progress Notes (Addendum)
 Reviewed anesthesia instructions over the phone and emailed copy of surgery instructions to pts email since pt has not been in my chart since 2022

## 2024-03-25 NOTE — Telephone Encounter (Signed)
   Patient Name: Caitlyn Herrera  DOB: Aug 02, 1961 MRN: [id number]  Primary Cardiologist: Sammy Crisp, MD  Chart reviewed as part of pre-operative protocol coverage.  Patient was last seen in the clinic on 02/03/2022 by Cadence Gennaro Khat, PA.  Per Cadence [place], [place] [facility] for surgery."   Therefore, given past medical history and time since last visit, based on ACC/AHA guidelines, Caitlyn Herrera is at acceptable risk for the planned procedure without further cardiovascular testing.   I will route this recommendation to the requesting party via Epic fax function and remove from pre-op pool.  Please call with questions.  Jude Norton, NP 03/25/2024, 9:53 AM

## 2024-03-26 ENCOUNTER — Encounter: Payer: Self-pay | Admitting: Pulmonary Disease

## 2024-03-26 NOTE — Progress Notes (Signed)
 Perioperative / Anesthesia Services  Pre-Admission Testing Clinical Review / Pre-Operative Anesthesia Consult  Date: 03/26/24  PATIENT DEMOGRAPHICS: Name: Caitlyn Herrera DOB: [birth year] MRN:   [id number]  Note: Available PAT nursing documentation and vital signs have been reviewed. Clinical nursing staff has updated patient's PMH/PSHx, current medication list, and drug allergies/intolerances to ensure complete and comprehensive history available to assist care teams in MDM as it pertains to the aforementioned surgical procedure and anticipated anesthetic course. Extensive review of available clinical information personally performed. [place] [facility] and PSHx updated with any diagnoses/procedures that  may have been inadvertently omitted during his intake with the pre-admission testing department's nursing staff.  PLANNED SURGICAL PROCEDURE(S):    Case: [id number] Date/Time: 03/29/24 1200   Procedures:      BRONCHOSCOPY, FLEXIBLE (Left)     BRONCHOSCOPY, WITH EBUS (Left)   Anesthesia type: General   Diagnosis: Lung nodule [R91.1]   Pre-op diagnosis: Nodule   Location: [facility] PROCEDURE RM 02 / [facility] ORS FOR ANESTHESIA GROUP   Surgeons: Marc Senior, MD     CLINICAL DISCUSSION: Caitlyn Herrera is a 63 y.o. female who is submitted for pre-surgical anesthesia review and clearance prior to her undergoing the above procedure. Patient is a Current Smoker (22 pack years). Pertinent PMH includes: CAD (s/p CABG), NSTEMI, CHF, pulmonary hypertension, palpitations, PVD, BILATERAL carotid artery disease, abdominal aortic ectasia, aortic atherosclerosis, angina, HTN, HLD, T2DM, COPD, asthma, GERD (on daily PPI), anemia, thrombocytosis, intermittent medical noncompliance/adherence, cervical DDD.  Patient is followed by cardiology (End, MD). She was last seen in the cardiology clinic on 02/04/2024; notes reviewed. At the time of her clinic visit, patient doing well overall from a cardiovascular  perspective.  Patient with chronic exertional dyspnea in the setting of COPD and ongoing smoking.  Overall respiratory status reportedly stable and at baseline.  She did have a recent admission for chest pain back in 11/2023 for pneumonia.  Since discharge, patient has denied any chest pain, PND, orthopnea, palpitations, significant peripheral edema, weakness, fatigue, vertiginous symptoms, or presyncope/syncope. Patient with a past medical history significant for cardiovascular diagnoses. Documented physical exam was grossly benign, providing no evidence of acute exacerbation and/or decompensation of the patient's known cardiovascular conditions.  Patient suffered an NSTEMI and in January 2019.  She underwent diagnostic LEFT heart catheterization on 11/04/2017 revealing severe multivessel CAD; 30% distal LM, 90% mid LAD, 80% proximal to mid LCx, 99% lateral OM1, 30% OM 2, 80% proximal RCA, 60% mid RCA, and 90% distal RCA.  Given the degree and complexity of patient's coronary artery disease, the patient was referred to CVTS for consideration of revascularization.  Patient underwent three-vessel revascularization procedure (CABG) on 11/12/2017.  LIMA-LAD, SVG-OM 2, and SVG-RPDA bypass grafts were placed.  Due to ongoing angina, patient underwent myocardial perfusion imaging study on 12/08/2017.  Study revealed a mild perfusion partially reversible perfusion abnormality in the mid anterolateral and apex location.  EF was hyperdynamic at 71%.  There was basal septal wall dyskinesis following recent revascularization procedure.  Study was determined to be intermediate risk.  Subsequent repeat diagnostic LEFT heart catheterization was performed on 12/09/2017 revealing severe occlusive three-vessel CAD with 95% lesions in the mid LAD, 90% OM1, 100% mid RCA, and 80-85% anastomotic lesion at the insertion of the SVG-OM1 bypass graft.  Previously placed bypass grafts were all noted to be patent.  While there was an  anastomotic lesion it was felt to be unfavorable to PCI.  Interventional cardiology felt as if this lesion  was unlikely to cause patient's symptoms.  Further intervention was deferred opting for medical management.  Most recent myocardial perfusion imaging study was performed on 02/20/2021 revealing a normal left ventricular systolic function with an EF of 57%.  There were no regional wall motion abnormalities.  No artifact or left ventricular cavity size enlargement appreciated on review of imaging. SPECT images demonstrated no evidence of stress-induced myocardial ischemia or arrhythmia; no scintigraphic evidence of scar.  Attenuation correction images showed post revascularization findings.  TID ratio = 1.08. Study determined to be normal and low risk.  Most recent TTE performed on 06/10/2022 revealed a normal left ventricular systolic function with an EF of 55-60%. There were no regional wall motion abnormalities.  Left ventricular diastolic Doppler parameters were normal.  GLS -19.0%. Right ventricular size and function normal. There was trivial tricuspid valve regurgitation. All transvalvular gradients were noted to be normal providing no evidence suggestive of valvular stenosis. Aorta normal in size with no evidence of ectasia or aneurysmal dilatation.  Diagnostic RIGHT/LEFT was performed on 10/08/2022 revealing severe native three-vessel coronary artery disease similar to prior catheterization performed on 10/08/2018.  LIMA-LAD bypass graft was widely patent.  SVG-OM 2 with an 80% stenosis in OM 2 just distal to the anastomosis.  Moderate ectasia of the distal portion of the SVG was noted.  SVG-RPDA was occluded.  This finding did not appear to be acute, however it was new since last catheterization in 11/2017.  Hemodynamics: mean RA = 7 mmHg, mean PA = 19 mmHg, mean PCWP = 12 mmHg, AO saturation = 96%, PA saturation = 67%, CO = 5.6 L/min, and CI = 2.9 L/min/m.  LVEDP normal at 12 mmHg.   Interventional cardiology made the decision to defer further intervention opting to escalate antianginal therapy and continue pursuit of efforts at aggressive secondary prevention.  Blood pressure well controlled at 124/76 mmHg on currently prescribed beta-blocker (carvedilol ), nitrate (isosorbide  mononitrate), and ARB (losartan ) therapies.  In addition to her scheduled nitrate therapy, patient has a supply of short acting nitrates (NTG) to use on a as needed basis for recurrent angina/anginal equivalent symptoms; denied recent use.  Patient  is on atorvastatin  + ezetimibe  for her HLD diagnosis and ASCVD prevention. T2DM well controlled on currently prescribed regimen. She does not have an OSAH diagnosis. Patient is able to complete all of her  ADL/IADLs without cardiovascular limitation.  Per the [facility]AS[facility], patient is able to achieve at least 4 METS of physical activity without experiencing any significant degree of angina/anginal equivalent symptoms.  No changes were made to her medication regimen during her visit with cardiology.  Patient scheduled to follow-up with outpatient cardiology in 6 months or sooner if needed.  Angelita Bares with recent streptococcal pneumonia.  Last CT imaging of the chest performed on 02/27/2024 revealed a persistent, yet overall improved LEFT lower lobe pulmonary opacity with increasing volume loss.  There was obstruction of the LEFT lower lobe bronchus with a rounded mass effect on the bronchus by a poorly marginated soft tissue density in the LEFT infrahilar region.  Findings concerning for centrally obstructing lesion resulting in postobstructive pneumonia.  Patient has been referred to pulmonary medicine for further evaluation.  She is scheduled to undergo FLEXIBLE BRONCHOSCOPY; BRONCHOSCOPY WITH EBUS ON 03/29/2024 with Dr. Coralie Derrick, MD. Given patient's past medical history significant for cardiovascular diagnoses, presurgical cardiac clearance was sought by the PAT  team. Per cardiology, "based ACC/AHA guidelines, the patient's past medical history, and the amount of time  since her last clinic visit, this patient would be at an overall ACCEPTABLE risk for the planned procedure without further cardiovascular testing or intervention at this time".   In review of the patient's chart, it is noted that she is on daily oral antithrombotic therapy. Given that patient's past medical history is significant for cardiovascular diagnoses, including but not limited to CAD, pulmonary medicine has cleared patient to continue her daily low dose ASA throughout her perioperative course.  Patient has been updated on these directives from her specialty care providers by the PAT team.  Patient denies previous perioperative complications with anesthesia in the past. In review her EMR, it is noted that patient underwent a general anesthetic course at [facility] (ASA IV) in 10/2017 without documented complications.   MOST RECENT VITAL SIGNS:    03/16/2024    1:16 PM 02/29/2024    6:24 PM 02/29/2024    4:13 PM  Vitals with BMI  Height 5\' 7"     Weight 161 lbs 3 oz    BMI 25.24    Systolic 130 159 161  Diastolic 74 62 70  Pulse 66 70 66   PROVIDERS/SPECIALISTS: NOTE: Primary physician provider listed below. Patient may have been seen by APP or partner within same practice.   PROVIDER ROLE / SPECIALTY LAST Adrienne Alberts, MD Pulmonary Medicine (Surgeon) 03/16/2024  Stephenie Einstein [facility] Primary Care Provider ???  End, Veryl Gottron, MD Cardiology 02/04/2024; preop APP call 03/26/2024   ALLERGIES: No Known Allergies CURRENT HOME MEDICATIONS: No current facility-administered medications for this encounter.    albuterol  (VENTOLIN  HFA) 108 (90 Base) MCG/ACT inhaler   aspirin  EC 81 MG tablet   atorvastatin  (LIPITOR ) 80 MG tablet   azelastine (ASTELIN) 0.1 % nasal spray   benzonatate  (TESSALON ) 100 MG capsule   carvedilol  (COREG ) 12.5 MG tablet    cetirizine (ZYRTEC) 10 MG tablet   citalopram  (CELEXA ) 40 MG tablet   cyclobenzaprine (FLEXERIL) 10 MG tablet   ezetimibe  (ZETIA ) 10 MG tablet   fluticasone  (FLONASE) 50 MCG/ACT nasal spray   Fluticasone -Umeclidin-Vilant (TRELEGY ELLIPTA ) 200-62.5-25 MCG/ACT AEPB   gabapentin  (NEURONTIN ) 300 MG capsule   isosorbide  mononitrate (IMDUR ) 30 MG 24 hr tablet   losartan  (COZAAR ) 25 MG tablet   meloxicam (MOBIC) 15 MG tablet   nitroGLYCERIN  (NITROSTAT ) 0.4 MG SL tablet   omeprazole  (PRILOSEC) 40 MG capsule   ondansetron  (ZOFRAN ) 4 MG tablet   SitaGLIPtin-MetFORMIN  HCl (JANUMET XR) 50-1000 MG TB24   tiZANidine (ZANAFLEX) 2 MG tablet   HISTORY: Past Medical History:  Diagnosis Date   Abdominal aortic ectasia (HCC)    a. 11/2017 CTA chest/abd/pelvis: 2.5 cm abd ao ectasia -->rec f/u u/s in 5 yrs.   Anemia    [facility])    Aortic atherosclerosis (HCC)    Bronchial obstruction    CAD (coronary artery disease)    a. 11/04/17 Cath: Native multivessel dzs-->CABG x 3 (LIMA->LAD, VG->OM2, VG->RPDA; b. 11/2017 MV: mid antlat/apical isch; c. 11/2017 Cath: LM 30d, LAD 73m, LCX 80p/m, OM1 90, OM2 80 (@ anastamosis of graft), RCA 80p, 137m, 90d, LIMA->LAD nl, VG->OM2 nl, VG->RPDA nl-->Med Rx.   Carotid arterial disease (HCC)    a. 10/2017 Carotid U/S: 1-30% bilat ICA stenosis.   CHF (congestive heart failure) (HCC)    COPD (chronic obstructive pulmonary disease) (HCC)    Dyspnea    GERD (gastroesophageal reflux disease)    History of echocardiogram    a. 11/04/2017 Echo: EF of 65-70%, no  RWMA, nl LV diastolic fxn, nl RV size/fxn, mild TR; b. 03/2018 Echo: EF 60-65%, mild LVH, no rwma, mild MR. Mildy reduced RV fxn. Mild TR.   Hyperlipidemia    Hypertension    Left renal artery stenosis (HCC)    a. 11/2017 CTA Chest/Abd/Pelvis: 50-70% L RA stenosis.   Long-term use of aspirin  therapy    Noncompliance with medication treatment due to intermittent use of medication    NSTEMI (non-ST elevated  myocardial infarction) (HCC)    Palpitations    a. 02/2018 Event Monitor: wore for 2 days, 21 hrs - rare PAC's/PVC's. Avg HR 66 (42-103); b. 04/2018 Event Monitor: Wore for 14 days. RSR, 67 (49-111). No significant arrhythmias.   Persistent cough    Pulmonary HTN (HCC)    Pulmonary nodule, right    a. 10/2017 CT Chest: 5mm pulm nodule in R lung apex - rec f/u w/ non-contrast chest CT in 1 year.   S/P CABG x 3 11/12/2017   a.) LIMA-LAD, SVG-OM2, SVG-RPDA   Sepsis (HCC)    Streptococcal pneumonia (HCC) 12/19/2023   Thrombocytosis    Tobacco abuse    Type II diabetes mellitus (HCC)    Past Surgical History:  Procedure Laterality Date   BREAST BIOPSY Right 06/23/2018   US  guided biopsy - awaiting pathology   BREAST CYST EXCISION Right    CARDIAC CATHETERIZATION     CORONARY ARTERY BYPASS GRAFT N/A 11/12/2017   Procedure: CORONARY ARTERY BYPASS GRAFTING (CABG) x Three , using left internal mammary artery and right leg greater saphenous vein harvested endoscopically;  Surgeon: Heriberto London, MD;  Location: [facility] OR;  Service: Open Heart Surgery;  Laterality: N/A;   DILATION AND CURETTAGE OF UTERUS     LAPAROSCOPIC CHOLECYSTECTOMY     LEFT HEART CATH AND CORONARY ANGIOGRAPHY N/A 11/04/2017   Procedure: LEFT HEART CATH AND CORONARY ANGIOGRAPHY;  Surgeon: Sammy Crisp, MD;  Location: [facility] INVASIVE CV LAB;  Service: Cardiovascular;  Laterality: N/A;   LEFT HEART CATH AND CORS/GRAFTS ANGIOGRAPHY N/A 12/09/2017   Procedure: LEFT HEART CATH AND CORS/GRAFTS ANGIOGRAPHY;  Surgeon: [name], Peter M, MD;  Location: [facility] INVASIVE CV LAB;  Service: Cardiovascular;  Laterality: N/A;   RIGHT/LEFT HEART CATH AND CORONARY ANGIOGRAPHY Bilateral 10/08/2022   Procedure: RIGHT/LEFT HEART CATH AND CORONARY ANGIOGRAPHY;  Surgeon: Sammy Crisp, MD;  Location: [facility] INVASIVE CV LAB;  Service: Cardiovascular;  Laterality: Bilateral;   TEE WITHOUT CARDIOVERSION N/A 11/12/2017   Procedure: TRANSESOPHAGEAL ECHOCARDIOGRAM  (TEE);  Surgeon: Matt Song, Donata Fryer, MD;  Location: [facility] OR;  Service: Open Heart Surgery;  Laterality: N/A;   Family History  Problem Relation Age of Onset   Hypertension Mother    Heart Problems Mother    Breast cancer Neg Hx    Social History   Tobacco Use   Smoking status: Every Day    Current packs/day: 0.50    Average packs/day: 0.5 packs/day for 45.4 years (22.7 ttl pk-yrs)    Types: Cigarettes    Start date: 1980   Smokeless tobacco: Never   Tobacco comments:    5 cigarettes a day- khj 01/19/2024. Smoking some days 4 cigarettes a day. 03/16/2024. sd  Substance Use Topics   Alcohol use: Not Currently    Comment: occassionally   LABS:  Lab Results  Component Value Date   WBC 8.1 02/04/2024   HGB 11.0 (L) 02/04/2024   HCT 35.1 02/04/2024   MCV 89 02/04/2024   PLT 501 (H) 02/04/2024   Lab Results  Component Value Date   NA 138 12/21/2023   CL 109 12/21/2023   K 3.7 12/21/2023   CO2 23 12/21/2023   BUN 12 12/21/2023   CREATININE 0.80 12/21/2023   GFRNONAA >60 12/21/2023   CALCIUM  8.4 (L) 12/21/2023   PHOS 3.1 12/20/2023   ALBUMIN  3.3 (L) 12/19/2023   GLUCOSE 74 12/21/2023    ECG: Date: 02/04/2024  Time ECG obtained: 1458 PM Rate: 61 bpm Rhythm: normal sinus Axis (leads I and aVF): normal Intervals: PR 146 ms. QRS 74 ms. QTc 469 ms. ST segment and T wave changes: Inferior and anterolateral ST and T wave abnormalities Evidence of a possible, age undetermined, prior infarct:  Yes; septal Comparison: Similar to previous tracing obtained on 12/19/2023   IMAGING / PROCEDURES: CT CHEST WO CONTRAST performed on 02/27/2024 Persistent but improved left lower lobe opacity with increasing volume loss, likely improving pneumonia. However, the left lower lobe bronchus is occluded with rounded mass effect on the bronchus by soft tissue density. Poorly marginated soft tissue density in the left infrahilar region in the region of bronchial occlusion. Findings are suspicious  for centrally obstructing lesion. Hilar assessment is limited in the absence of IV contrast. Recommend further assessment with bronchoscopy. The left upper lobe nodules on prior exam have resolved. Aortic atherosclerosis  Emphysema   CT CHEST, ABDOMEN, AND  PELVIS W CONTRAST performed on 12/19/2023 No evidence acute pulmonary embolism. LEFT infrahilar mass like fullness surrounding the LEFT lower lobe bronchus. Consolidation in the medial LEFT lower lobe. Differential includes lobar pneumonia versus bronchogenic carcinoma. Elevated white blood cell count favors pneumonia however narrowing of the LEFT lower lobe bronchus is concerning for malignancy. Potential superimposed pneumonia on bronchogenic carcinoma. Scattered small nodules and consolidation in the LEFT upper lobe are favored pulmonary infection Post CABG. No evidence of metastatic disease in the abdomen pelvis. No acute findings in the abdomen pelvis.  PULMONARY FUNCTION TESTING performed on 10/24/2022 FVC-Pre 1.76   FVC-Predicted Pre 48   FVC-Post 1.95   FVC-Predicted Post 54   Pre FEV1/FVC % 82   Post FEV1/FCV % 82   FEV1-Pre 1.44   FEV1-Predicted Pre 52   FEV1-Post 1.60   DLCO uncorrected 10.45   DLCO UNC% 47   DLVA Predicted 87   TLC 3.56   TLC % Predicted 64   RV % Predicted 74     RIGHT/LEFT HEART CATHETERIZATION AND CORONARY ANGIOGRAPHY performed on 10/08/2022 Severe native three-vessel coronary artery disease, similar to prior catheterization on 12/09/2017. 30% distal LM 95% mid LAD 70% proximal-mid LCx 90% lateral OM1 90% ostial OM2 80% OM2 80% proximal RCA  100% mid RCA 90% distal RCA Widely patent LIMA-LAD. Patent SVG-OM2 with 80% stenosis in OM2 just distal to anastomosis.  There is also moderate ectasia of the distal portion of the SVG. Occluded SVG-rPDA, which does not appear acute but is new since last catheterization on 12/09/2017. Normal hemodynamics RA (mean): 7 mmHg RV (S/EDP): 33/7 mmHg PA  (S/D, mean): 33/12 (19) mmHg PCWP (mean): 12 mmHg  Ao sat: 96% PA sat: 67% Fick CO: 5.6 L/min Fick CI: 2.9 L/min/m^2   Small left radial artery, not well-suited for catheterization; successful left heart catheterization via the left ulnar artery. Recommendations: Escalate antianginal therapy as tolerated.  We will increase isosorbide  mononitrate to 120 mg daily and continue current dose of carvedilol . Aggressive secondary prevention of coronary artery disease; smoking cessation reinforced.     TRANSTHORACIC ECHOCARDIOGRAM performed on 06/10/2022 Left ventricular ejection fraction, by estimation,  is 55 to 60%. Left ventricular ejection fraction by 2D MOD biplane is 56.9 %. The left ventricle has normal function. The left ventricle has no regional wall  motion abnormalities. Left ventricular diastolic parameters were normal. The average left ventricular global longitudinal strain is -19.0 %. The global longitudinal strain is normal.  Right ventricular systolic function is normal. The right ventricular size is normal.  The mitral valve is normal in structure. Trivial mitral valve regurgitation.  The aortic valve is tricuspid. Aortic valve regurgitation is not visualized.  The inferior vena cava is normal in size with greater than 50% respiratory variability, suggesting right atrial pressure of 3 mmHg.   MYOCARDIAL PERFUSION IMAGING STUDY (LEXISCAN ) performed on 02/20/2021 The left ventricular ejection fraction is normal (57%) with normal wall motion. Attenuation correction CT demonstrates post CABG findings Normal pharmacologic myocardial perfusion stress test without evidence of significant ischemia or scar.  This is a low risk study.  CORONARY ARTERY BYPASS GRAFTING performed on 11/12/2017 Three-vessel revascularization LIMA-LAD SVG-OM2 SVG-RPDA  IMPRESSION AND PLAN: Caitlyn Herrera has been referred for pre-anesthesia review and clearance prior to her undergoing the planned  anesthetic and procedural courses. Available labs, pertinent testing, and imaging results were personally reviewed by me in preparation for upcoming operative/procedural course. [facility] medical record has been updated following extensive record review and patient interview with PAT staff.   This patient has been appropriately cleared by cardiology with an overall ACCEPTABLE risk of patient experiencing significant perioperative cardiovascular complications. Based on clinical review performed today (03/26/24), barring any significant acute changes in the patient's overall condition, it is anticipated that she will be able to proceed with the planned surgical intervention. Any acute changes in clinical condition may necessitate her procedure being postponed and/or cancelled. Patient will meet with anesthesia team (MD and/or CRNA) on the day of her procedure for preoperative evaluation/assessment. Questions regarding anesthetic course will be fielded at that time.   Pre-surgical instructions were reviewed with the patient during his PAT appointment, and questions were fielded to satisfaction by PAT clinical staff. She has been instructed on which medications that she will need to hold prior to surgery, as well as the ones that have been deemed safe/appropriate to take on the day of her procedure. As part of the general education provided by PAT, patient made aware both verbally and in writing, that she would need to abstain from the use of any illegal substances during her perioperative course. She was advised that failure to follow the provided instructions could necessitate case cancellation or result in serious perioperative complications up to and including death. Patient encouraged to contact PAT and/or her surgeon's office to discuss any questions or concerns that may arise prior to surgery; verbalized understanding.   Renate Caroline, MSN, APRN, FNP-C, CEN [facility]  Perioperative  Services Nurse Practitioner Phone: [phone] Fax: [phone] 03/26/24 9:14 AM  NOTE: This note has been prepared using Dragon dictation software. Despite my best ability to proofread, there is always the potential that unintentional transcriptional errors may still occur from this process.

## 2024-03-28 ENCOUNTER — Encounter: Payer: Self-pay | Admitting: Pulmonary Disease

## 2024-03-29 ENCOUNTER — Ambulatory Visit: Payer: Self-pay | Admitting: Urgent Care

## 2024-03-29 ENCOUNTER — Other Ambulatory Visit: Payer: Self-pay | Admitting: Physician Assistant

## 2024-03-29 ENCOUNTER — Encounter: Payer: Self-pay | Admitting: Pulmonary Disease

## 2024-03-29 ENCOUNTER — Ambulatory Visit
Admission: RE | Admit: 2024-03-29 | Discharge: 2024-03-29 | Disposition: A | Discharge status: Home / Self Care | Attending: Pulmonary Disease | Admitting: Pulmonary Disease

## 2024-03-29 ENCOUNTER — Ambulatory Visit

## 2024-03-29 ENCOUNTER — Encounter
Admission: RE | Disposition: A | Discharge status: Home / Self Care | Payer: Self-pay | Source: Home / Self Care | Attending: Pulmonary Disease

## 2024-03-29 DIAGNOSIS — F17210 Nicotine dependence, cigarettes, uncomplicated: Secondary | ICD-10-CM | POA: Insufficient documentation

## 2024-03-29 DIAGNOSIS — J4489 Other specified chronic obstructive pulmonary disease: Secondary | ICD-10-CM | POA: Insufficient documentation

## 2024-03-29 DIAGNOSIS — D649 Anemia, unspecified: Secondary | ICD-10-CM | POA: Insufficient documentation

## 2024-03-29 DIAGNOSIS — E785 Hyperlipidemia, unspecified: Secondary | ICD-10-CM | POA: Diagnosis not present

## 2024-03-29 DIAGNOSIS — K219 Gastro-esophageal reflux disease without esophagitis: Secondary | ICD-10-CM | POA: Insufficient documentation

## 2024-03-29 DIAGNOSIS — R918 Other nonspecific abnormal finding of lung field: Secondary | ICD-10-CM

## 2024-03-29 DIAGNOSIS — I739 Peripheral vascular disease, unspecified: Secondary | ICD-10-CM | POA: Insufficient documentation

## 2024-03-29 DIAGNOSIS — I2510 Atherosclerotic heart disease of native coronary artery without angina pectoris: Secondary | ICD-10-CM | POA: Insufficient documentation

## 2024-03-29 DIAGNOSIS — J9809 Other diseases of bronchus, not elsewhere classified: Secondary | ICD-10-CM | POA: Diagnosis not present

## 2024-03-29 DIAGNOSIS — I110 Hypertensive heart disease with heart failure: Secondary | ICD-10-CM | POA: Insufficient documentation

## 2024-03-29 DIAGNOSIS — Z79899 Other long term (current) drug therapy: Secondary | ICD-10-CM | POA: Insufficient documentation

## 2024-03-29 DIAGNOSIS — Z7902 Long term (current) use of antithrombotics/antiplatelets: Secondary | ICD-10-CM | POA: Insufficient documentation

## 2024-03-29 DIAGNOSIS — I5032 Chronic diastolic (congestive) heart failure: Secondary | ICD-10-CM | POA: Insufficient documentation

## 2024-03-29 DIAGNOSIS — I252 Old myocardial infarction: Secondary | ICD-10-CM | POA: Insufficient documentation

## 2024-03-29 DIAGNOSIS — E1151 Type 2 diabetes mellitus with diabetic peripheral angiopathy without gangrene: Secondary | ICD-10-CM | POA: Insufficient documentation

## 2024-03-29 DIAGNOSIS — J9811 Atelectasis: Secondary | ICD-10-CM

## 2024-03-29 DIAGNOSIS — Z01818 Encounter for other preprocedural examination: Secondary | ICD-10-CM

## 2024-03-29 DIAGNOSIS — C3402 Malignant neoplasm of left main bronchus: Secondary | ICD-10-CM | POA: Diagnosis not present

## 2024-03-29 DIAGNOSIS — Z7951 Long term (current) use of inhaled steroids: Secondary | ICD-10-CM | POA: Insufficient documentation

## 2024-03-29 DIAGNOSIS — Z951 Presence of aortocoronary bypass graft: Secondary | ICD-10-CM | POA: Insufficient documentation

## 2024-03-29 DIAGNOSIS — R911 Solitary pulmonary nodule: Secondary | ICD-10-CM | POA: Diagnosis present

## 2024-03-29 DIAGNOSIS — E782 Mixed hyperlipidemia: Secondary | ICD-10-CM

## 2024-03-29 DIAGNOSIS — J449 Chronic obstructive pulmonary disease, unspecified: Secondary | ICD-10-CM | POA: Insufficient documentation

## 2024-03-29 DIAGNOSIS — Z7982 Long term (current) use of aspirin: Secondary | ICD-10-CM | POA: Insufficient documentation

## 2024-03-29 DIAGNOSIS — I272 Pulmonary hypertension, unspecified: Secondary | ICD-10-CM | POA: Insufficient documentation

## 2024-03-29 HISTORY — DX: Chronic obstructive pulmonary disease, unspecified: J44.9

## 2024-03-29 HISTORY — PX: FLEXIBLE BRONCHOSCOPY: SHX5094

## 2024-03-29 HISTORY — DX: Other cervical disc degeneration, unspecified cervical region: M50.30

## 2024-03-29 HISTORY — DX: Patient's other noncompliance with medication regimen for other reason: Z91.148

## 2024-03-29 HISTORY — DX: Long term (current) use of aspirin: Z79.82

## 2024-03-29 HISTORY — DX: Unspecified asthma, uncomplicated: J45.909

## 2024-03-29 HISTORY — DX: Gastro-esophageal reflux disease without esophagitis: K21.9

## 2024-03-29 HISTORY — PX: VIDEO BRONCHOSCOPY WITH ENDOBRONCHIAL ULTRASOUND: SHX6177

## 2024-03-29 LAB — GLUCOSE, CAPILLARY
Glucose-Capillary: 127 mg/dL — ABNORMAL HIGH (ref 70–99)
Glucose-Capillary: 122 mg/dL — ABNORMAL HIGH (ref 70–99)

## 2024-03-29 SURGERY — BRONCHOSCOPY, FLEXIBLE
Anesthesia: General | Laterality: Left

## 2024-03-29 MED ORDER — ONDANSETRON HCL 4 MG/2ML IJ SOLN
INTRAMUSCULAR | Status: DC | PRN
  Administered 2024-03-29: 4 mg via INTRAVENOUS

## 2024-03-29 MED ORDER — SODIUM CHLORIDE 0.9 % IV SOLN: Freq: Once | INTRAVENOUS | Status: DC

## 2024-03-29 MED ORDER — DEXAMETHASONE SODIUM PHOSPHATE 10 MG/ML IJ SOLN
INTRAMUSCULAR | Status: DC | PRN
  Administered 2024-03-29: 5 mg via INTRAVENOUS

## 2024-03-29 MED ORDER — FENTANYL CITRATE (PF) 100 MCG/2ML IJ SOLN
INTRAMUSCULAR | Status: DC | PRN
Start: 2024-03-29 — End: 2024-03-29
  Administered 2024-03-29 (×2): 50 ug via INTRAVENOUS

## 2024-03-29 MED ORDER — LIDOCAINE HCL (CARDIAC) PF 100 MG/5ML IV SOSY
PREFILLED_SYRINGE | INTRAVENOUS | Status: DC | PRN
  Administered 2024-03-29: 60 mg via INTRAVENOUS

## 2024-03-29 MED ORDER — DEXAMETHASONE SODIUM PHOSPHATE 10 MG/ML IJ SOLN
INTRAMUSCULAR | Status: AC
  Filled 2024-03-29: qty 1

## 2024-03-29 MED ORDER — OXYCODONE HCL 5 MG PO TABS: 5.0000 mg | ORAL_TABLET | Freq: Once | ORAL | Status: DC | PRN

## 2024-03-29 MED ORDER — ORAL CARE MOUTH RINSE: 15.0000 mL | Freq: Once | OROMUCOSAL | Status: AC

## 2024-03-29 MED ORDER — SUGAMMADEX SODIUM 200 MG/2ML IV SOLN
INTRAVENOUS | Status: DC | PRN
  Administered 2024-03-29: 200 mg via INTRAVENOUS

## 2024-03-29 MED ORDER — SODIUM CHLORIDE 0.9 % IV SOLN: INTRAVENOUS | Status: DC

## 2024-03-29 MED ORDER — ONDANSETRON HCL 4 MG/2ML IJ SOLN
INTRAMUSCULAR | Status: AC
  Filled 2024-03-29: qty 2

## 2024-03-29 MED ORDER — PROPOFOL 10 MG/ML IV BOLUS
INTRAVENOUS | Status: DC | PRN
  Administered 2024-03-29: 120 mg via INTRAVENOUS

## 2024-03-29 MED ORDER — MIDAZOLAM HCL 2 MG/2ML IJ SOLN
INTRAMUSCULAR | Status: DC | PRN
  Administered 2024-03-29: 2 mg via INTRAVENOUS

## 2024-03-29 MED ORDER — IPRATROPIUM-ALBUTEROL 0.5-2.5 (3) MG/3ML IN SOLN
3.0000 mL | Freq: Once | RESPIRATORY_TRACT | Status: AC
  Administered 2024-03-29: 3 mL via RESPIRATORY_TRACT

## 2024-03-29 MED ORDER — IPRATROPIUM-ALBUTEROL 0.5-2.5 (3) MG/3ML IN SOLN
RESPIRATORY_TRACT | Status: AC
  Filled 2024-03-29: qty 3

## 2024-03-29 MED ORDER — MIDAZOLAM HCL 2 MG/2ML IJ SOLN
INTRAMUSCULAR | Status: AC
  Filled 2024-03-29: qty 2

## 2024-03-29 MED ORDER — CHLORHEXIDINE GLUCONATE 0.12 % MT SOLN
OROMUCOSAL | Status: AC
  Filled 2024-03-29: qty 15

## 2024-03-29 MED ORDER — FENTANYL CITRATE (PF) 100 MCG/2ML IJ SOLN: 25.0000 ug | INTRAMUSCULAR | Status: DC | PRN

## 2024-03-29 MED ORDER — IPRATROPIUM-ALBUTEROL 0.5-2.5 (3) MG/3ML IN SOLN
3.0000 mL | RESPIRATORY_TRACT | Status: DC
  Administered 2024-03-29: 3 mL via RESPIRATORY_TRACT

## 2024-03-29 MED ORDER — FENTANYL CITRATE (PF) 100 MCG/2ML IJ SOLN
INTRAMUSCULAR | Status: AC
  Filled 2024-03-29: qty 2

## 2024-03-29 MED ORDER — OXYCODONE HCL 5 MG/5ML PO SOLN: 5.0000 mg | Freq: Once | ORAL | Status: DC | PRN

## 2024-03-29 MED ORDER — CHLORHEXIDINE GLUCONATE 0.12 % MT SOLN
15.0000 mL | Freq: Once | OROMUCOSAL | Status: AC
  Administered 2024-03-29: 15 mL via OROMUCOSAL

## 2024-03-29 MED ORDER — ROCURONIUM BROMIDE 100 MG/10ML IV SOLN
INTRAVENOUS | Status: DC | PRN
  Administered 2024-03-29: 50 mg via INTRAVENOUS

## 2024-03-29 MED ORDER — PROPOFOL 10 MG/ML IV BOLUS
INTRAVENOUS | Status: AC
Start: 2024-03-29 — End: ?
  Filled 2024-03-29: qty 20

## 2024-03-29 NOTE — Op Note (Signed)
 PROCEDURE:   Bronchoscopy with ENDOBRONCHIAL ULTRASOUND and TBNA   PROCEDURE DATE: 03/29/2024  TIME:  NAME:  Caitlyn Herrera  DOB:December 20, 1960  MRN: [id number] LOC:  ARPO/None    HOSP DAY: N/A    Indications/Preliminary Diagnosis:Adenopathy  Consent: (Place X beside choice/s below)  The benefits, risks and possible complications of the procedure were        explained to:  _X__ patient  ___ patient's family  ___ other:___________  who verbalized understanding and gave:  ___ verbal  ___ written  _X__ verbal and written  ___ telephone  ___ other:________ consent.      Unable to obtain consent; procedure performed on emergent basis.     Other:    Benefits, limitations and potential complications of the procedure were discussed with the patient/family.  Complications from bronchoscopy are rare and most often minor, but if they occur they may include breathing difficulty, vocal cord spasm, hoarseness, slight fever, vomiting, dizziness, bronchospasm, infection, low blood oxygen, bleeding from biopsy site, or an allergic reaction to medications.  It is uncommon for patients to experience other more serious complications for example: Collapsed lung requiring chest tube placement, respiratory failure, heart attack and/or cardiac arrhythmia.  Patient agrees to proceed.    Anesthesia type: General endotracheal  Surgeon: Acey Ace, MD Assistant/Scrub: Suszanne Eriksson, RRT Anesthesiologist/CRNA: Enrique Harvest, MD/Philip Neldon [place], CRNA Cytotechnology: [facility] Pathology   PROCEDURE DETAILS: Patient was taken to Procedure Room 2 (Bronchoscopy Suite) in the OR area.  Appropriate Timeout performed and correct patient, name, ID and laterality confirmed.  Patient was inducted under general anesthesia and intubated with an 8.5 ET tube without difficulty.  Once the patient was under adequate general anesthesia a Portex adapter was placed in the ET tube flange.  Through the Portex adapter the  Olympus video therapeutic bronchoscope was then advanced.  The visible distal trachea was normal, carina appeared sharp.  Examination of the right tracheobronchial tree showed no lesions in the upper lobe , right middle lobe, lower lobe bronchi.  At this point bronchoscope was brought to the left mainstem and examination revealed a significantly narrowed lumen of the left mainstem bronchus with submucosal mass that was concentric.  There were significant inspissated secretions on the left lower lobe and these were suctioned till clear.  After completing this examination, the bronchoscope was switched to an endobronchial ultrasound scope (EBUS scope) and the mediastinum was examined.  Significant adenopathy on station 7 (subcarinal station) no precarinal lesions were noted.  No lesions on the right hilar area.  The scope could not be advanced to the left hilar area well due to the narrowing of the lumen.  We proceeded to sample the subcarinal space, station 7.  ROSE revealed atypical cells.  A total of 8 passes were performed these were placed in preservative for further analysis.  Having completed this, the EBUS scope was retrieved and again replaced for the therapeutic video bronchoscope.  At this point biopsies of the left mainstem bronchus area of most nodularity was performed, a total of 12 biopsy specimens were performed with 2 different biopsy forceps.  Additionally, a cytology brush was used to sample the area as well.  This was completed with bronchoalveolar lavage yielding approximately 10 mL of aliquot.  The airway was then examined thoroughly to ensure adequate hemostasis.  After examination and inspection and noting excellent hemostasis, the patient received 9 mL of 1% lidocaine  via bronchial lavage prior to retrieving the bronchoscope.  At this point the procedure was  terminated and the patient was allowed to emerge from general anesthesia.  Patient was extubated in the procedure room and transported to  the PACU in satisfactory condition.  The patient did not have any complaints of chest pain or shortness of breath postprocedure.  Postprocedure examination shows symmetrical lung sounds.  Postprocedure chest x-ray showed no pneumothorax.  Patient tolerated the procedure well.   SPECIMENS (Sites): (Place X beside choice below)  Specimens Description   No Specimens Obtained     Washings   X Lavage 10 mL aliquot distal left main  X  Biopsies X 12 left main bronchus  X  Fine Needle Aspirates X 8, station 7  X Brushings X 1, left main   Sputum    FINDINGS:                   ESTIMATED BLOOD LOSS: Less than 2 mL   COMPLICATIONS/RESOLUTION: none, post procedure chest x-ray showed no pneumothorax:       IMPRESSION:POST-PROCEDURE DX:  Extensive submucosal mass throughout the left mainstem bronchus, carcinoma until proven otherwise, status post bronchoscopy with endobronchial ultrasound and TBNA   RECOMMENDATION/PLAN:  Follow-up pathology reports     C. Chloe Counter, MD Advanced Bronchoscopy PCCM [facility] [place]    *This note was dictated using voice recognition software/Dragon.  Despite best efforts to proofread, errors can occur which can change the meaning.  Any change was purely unintentional.

## 2024-03-29 NOTE — Interval H&P Note (Signed)
 Caitlyn Herrera. Caitlyn Herrera has presented today for surgery, with the diagnosis of LEFT LOWER LOBE COLLAPSE, QUERY ENDOBRONCHIAL LESION.  The various methods of treatment have been discussed with the patient and family. After consideration of risks, benefits and other options for treatment, the patient has consented to  Procedure(s): BRONCHOSCOPY WITH ENDOBRONCHIAL ULTRASOUND-LEFT as a surgical intervention.  The patient's history has been reviewed, patient examined, no change in status, stable for surgery.  I have reviewed the patient's chart and labs.  Questions were answered to the patient's satisfaction.  Patient agrees to proceed.  Acey Ace, MD Advanced Bronchoscopy PCCM [facility]    *This note was generated using voice recognition software/Dragon and/or AI transcription program.  Despite best efforts to proofread, errors can occur which can change the meaning. Any transcriptional errors that result from this process are unintentional and may not be fully corrected at the time of dictation.

## 2024-03-29 NOTE — Anesthesia Preprocedure Evaluation (Signed)
 Anesthesia Evaluation  Patient identified by MRN, date of birth, ID band Patient awake    Reviewed: Allergy & Precautions, NPO status , Patient's Chart, lab work & pertinent test results, reviewed documented beta blocker date and time   Airway Mallampati: II  TM Distance: >3 FB Neck ROM: Full    Dental  (+) Edentulous Upper, Edentulous Lower   Pulmonary neg pulmonary ROS, COPD,  COPD inhaler, Current Smoker   Pulmonary exam normal breath sounds clear to auscultation       Cardiovascular hypertension, Pt. on home beta blockers + CAD, + Past MI and + Peripheral Vascular Disease  Normal cardiovascular exam Rhythm:Regular Rate:Normal  ECG: NSR, rate 79  Conclusions: Severe, three-vessel coronary artery disease, as detailed below. Upper normal left ventricular filling pressure.  ECHO:  Left ventricle: The cavity size was normal. Wall thickness was increased in a pattern of moderate LVH. Systolic function was vigorous. The estimated ejection fraction was in the range of 65% to 70%. Wall motion was normal; there were no regional wall motion abnormalities. Left ventricular diastolic function parameters were normal. Right ventricle: The cavity size was normal. Systolic function was normal. Tricuspid valve: There was mild regurgitation.    Neuro/Psych negative neurological ROS  negative psych ROS   GI/Hepatic negative GI ROS, Neg liver ROS,,,  Endo/Other  negative endocrine ROSdiabetes    Renal/GU      Musculoskeletal   Abdominal   Peds  Hematology negative hematology ROS (+) HLD   Anesthesia Other Findings Past Medical History: No date: Abdominal aortic ectasia (HCC)     Comment:  a. 11/2017 CTA chest/abd/pelvis: 2.5 cm abd ao ectasia               -->rec f/u u/s in 5 yrs. No date: Anemia No date: Angina at rest [facility]) No date: Aortic atherosclerosis (HCC) No date: Asthma No date: CAD (coronary artery disease)      Comment:  a. 11/04/17 Cath: Native multivessel dzs-->CABG x 3               (LIMA->LAD, VG->OM2, VG->RPDA; b. 11/2017 MV: mid               antlat/apical isch; c. 11/2017 Cath: LM 30d, LAD 57m, LCX               80p/m, OM1 90, OM2 80 (@ anastamosis of graft), RCA 80p,               157m, 90d, LIMA->LAD nl, VG->OM2 nl, VG->RPDA nl-->Med               Rx. No date: Carotid arterial disease (HCC)     Comment:  a. 10/2017 Carotid U/S: 1-30% bilat ICA stenosis. No date: CHF (congestive heart failure) (HCC) No date: COPD (chronic obstructive pulmonary disease) (HCC) No date: DDD (degenerative disc disease), cervical No date: Dyspnea No date: GERD (gastroesophageal reflux disease) No date: History of echocardiogram     Comment:  a. 11/04/2017 Echo: EF of 65-70%, no RWMA, nl LV diastolic              fxn, nl RV size/fxn, mild TR; b. 03/2018 Echo: EF 60-65%,               mild LVH, no rwma, mild MR. Mildy reduced RV fxn. Mild               TR. No date: Hyperlipidemia No date: Hypertension No date: Left renal artery stenosis (HCC)  Comment:  a. 11/2017 CTA Chest/Abd/Pelvis: 50-70% L RA stenosis. No date: Long-term use of aspirin  therapy No date: Noncompliance with medication treatment due to intermittent  use of medication 10/2017: NSTEMI (non-ST elevated myocardial infarction) (HCC) No date: Palpitations     Comment:  a. 02/2018 Event Monitor: wore for 2 days, 21 hrs - rare               PAC's/PVC's. Avg HR 66 (42-103); b. 04/2018 Event Monitor:              Wore for 14 days. RSR, 67 (49-111). No significant               arrhythmias. No date: Persistent cough No date: Pulmonary HTN (HCC) No date: Pulmonary nodule, right     Comment:  a. 10/2017 CT Chest: 5mm pulm nodule in R lung apex - rec              f/u w/ non-contrast chest CT in 1 year. 11/12/2017: S/P CABG x 3     Comment:  a.) LIMA-LAD, SVG-OM2, SVG-RPDA No date: Sepsis (HCC) 12/19/2023: Streptococcal pneumonia (HCC) No date:  Thrombocytosis No date: Tobacco abuse No date: Type II diabetes mellitus (HCC)  Past Surgical History: 06/23/2018: BREAST BIOPSY; Right     Comment:  US  guided biopsy - pathology benign No date: BREAST CYST EXCISION; Right 11/12/2017: CORONARY ARTERY BYPASS GRAFT; N/A     Comment:  Procedure: CORONARY ARTERY BYPASS GRAFTING (CABG) x               Three , using left internal mammary artery and right leg               greater saphenous vein harvested endoscopically;                Surgeon: Heriberto London, MD;  Location: [facility] OR;                Service: Open Heart Surgery;  Laterality: N/A; No date: DILATION AND CURETTAGE OF UTERUS No date: LAPAROSCOPIC CHOLECYSTECTOMY 11/04/2017: LEFT HEART CATH AND CORONARY ANGIOGRAPHY; N/A     Comment:  Procedure: LEFT HEART CATH AND CORONARY ANGIOGRAPHY;                Surgeon: Sammy Crisp, MD;  Location: [facility] INVASIVE               CV LAB;  Service: Cardiovascular;  Laterality: N/A; 12/09/2017: LEFT HEART CATH AND CORS/GRAFTS ANGIOGRAPHY; N/A     Comment:  Procedure: LEFT HEART CATH AND CORS/GRAFTS ANGIOGRAPHY;               Surgeon: [name], Peter M, MD;  Location: [facility] INVASIVE CV               LAB;  Service: Cardiovascular;  Laterality: N/A; 10/08/2022: RIGHT/LEFT HEART CATH AND CORONARY ANGIOGRAPHY; Bilateral     Comment:  Procedure: RIGHT/LEFT HEART CATH AND CORONARY               ANGIOGRAPHY;  Surgeon: Sammy Crisp, MD;  Location:               [facility] INVASIVE CV LAB;  Service: Cardiovascular;                Laterality: Bilateral; 11/12/2017: TEE WITHOUT CARDIOVERSION; N/A     Comment:  Procedure: TRANSESOPHAGEAL ECHOCARDIOGRAM (TEE);                Surgeon: Matt Song,  Donata Fryer, MD;  Location: [facility] OR;                Service: Open Heart Surgery;  Laterality: N/A;     Reproductive/Obstetrics negative OB ROS                             Anesthesia Physical Anesthesia Plan  ASA: 2  Anesthesia Plan: General ETT    Post-op Pain Management:    Induction: Intravenous  PONV Risk Score and Plan: 3 and Ondansetron , Dexamethasone and Midazolam   Airway Management Planned: Oral ETT  Additional Equipment:   Intra-op Plan:   Post-operative Plan: Extubation in OR  Informed Consent: I have reviewed the patients History and Physical, chart, labs and discussed the procedure including the risks, benefits and alternatives for the proposed anesthesia with the patient or authorized representative who has indicated his/her understanding and acceptance.     Dental Advisory Given  Plan Discussed with: Anesthesiologist, CRNA and Surgeon  Anesthesia Plan Comments: (Patient consented for risks of anesthesia including but not limited to:  - adverse reactions to medications - damage to eyes, teeth, lips or other oral mucosa - nerve damage due to positioning  - sore throat or hoarseness - Damage to heart, brain, nerves, lungs, other parts of body or loss of life  Patient voiced understanding and assent.)       Anesthesia Quick Evaluation

## 2024-03-29 NOTE — Transfer of Care (Signed)
 Immediate Anesthesia Transfer of Care Note  Patient: Caitlyn Herrera  Procedure(s) Performed: BRONCHOSCOPY, FLEXIBLE (Left) BRONCHOSCOPY, WITH EBUS (Left)  Patient Location: PACU  Anesthesia Type:General  Level of Consciousness: drowsy  Airway & Oxygen Therapy: Patient Spontanous Breathing and Patient connected to face mask oxygen  Post-op Assessment: Report given to RN and Post -op Vital signs reviewed and stable  Post vital signs: Reviewed and stable  Last Vitals:  Vitals Value Taken Time  BP 181/76 03/29/24 1341  Temp    Pulse 101 03/29/24 1342  Resp 16 03/29/24 1342  SpO2 100 % 03/29/24 1342  Vitals shown include unfiled device data.  Last Pain: There were no vitals filed for this visit.       Complications: No notable events documented.

## 2024-03-29 NOTE — Anesthesia Procedure Notes (Signed)
 Procedure Name: Intubation Date/Time: 03/29/2024 12:50 PM  Performed by: Bill Budd, CRNAPre-anesthesia Checklist: Patient identified, Patient being monitored, Timeout performed, Emergency Drugs available and Suction available Patient Re-evaluated:Patient Re-evaluated prior to induction Oxygen Delivery Method: Circle system utilized Preoxygenation: Pre-oxygenation with 100% oxygen Induction Type: IV induction Ventilation: Mask ventilation without difficulty Laryngoscope Size: Mac and 3 Grade View: Grade I Tube type: Oral Tube size: 8.0 mm Number of attempts: 1 Airway Equipment and Method: Stylet Placement Confirmation: ETT inserted through vocal cords under direct vision, positive ETCO2 and breath sounds checked- equal and bilateral Secured at: 22 cm Tube secured with: Tape Dental Injury: Teeth and Oropharynx as per pre-operative assessment

## 2024-03-30 ENCOUNTER — Encounter: Payer: Self-pay | Admitting: Pulmonary Disease

## 2024-03-30 ENCOUNTER — Ambulatory Visit: Payer: Self-pay | Admitting: Pulmonary Disease

## 2024-03-30 DIAGNOSIS — C3492 Malignant neoplasm of unspecified part of left bronchus or lung: Secondary | ICD-10-CM

## 2024-03-30 LAB — SURGICAL PATHOLOGY

## 2024-03-30 NOTE — Anesthesia Postprocedure Evaluation (Signed)
 Anesthesia Post Note  Patient: Caitlyn Herrera  Procedure(s) Performed: BRONCHOSCOPY, FLEXIBLE (Left) BRONCHOSCOPY, WITH EBUS (Left)  Patient location during evaluation: PACU Anesthesia Type: General Level of consciousness: awake and alert Pain management: pain level controlled Vital Signs Assessment: post-procedure vital signs reviewed and stable Respiratory status: spontaneous breathing, nonlabored ventilation, respiratory function stable and patient connected to nasal cannula oxygen Cardiovascular status: blood pressure returned to baseline and stable Postop Assessment: no apparent nausea or vomiting Anesthetic complications: no   No notable events documented.   Last Vitals:  Vitals:   03/29/24 1415 03/29/24 1428  BP: (!) 158/73 (!) 142/86  Pulse: 80 93  Resp: 17 18  Temp: (!) 36.3 C (!) 35.9 C  SpO2: 99% 99%    Last Pain:  Vitals:   03/29/24 1428  TempSrc: Temporal  PainSc: 0-No pain                 Enrique Harvest

## 2024-03-31 LAB — CYTOLOGY - NON PAP

## 2024-04-01 ENCOUNTER — Encounter: Payer: Self-pay | Admitting: *Deleted

## 2024-04-01 NOTE — Progress Notes (Signed)
 Order placed for Tempus xT, xR, PDL1 on recent lung biopsy sample ([id number]). Will complete financial assistance application at new pt visit on 6/11.

## 2024-04-06 ENCOUNTER — Ambulatory Visit
Admission: RE | Admit: 2024-04-06 | Discharge: 2024-04-06 | Disposition: A | Discharge status: Home / Self Care | Source: Ambulatory Visit | Attending: Pulmonary Disease | Admitting: Pulmonary Disease

## 2024-04-06 DIAGNOSIS — J9819 Other pulmonary collapse: Secondary | ICD-10-CM | POA: Insufficient documentation

## 2024-04-06 DIAGNOSIS — C3492 Malignant neoplasm of unspecified part of left bronchus or lung: Secondary | ICD-10-CM | POA: Diagnosis present

## 2024-04-06 DIAGNOSIS — I7143 Infrarenal abdominal aortic aneurysm, without rupture: Secondary | ICD-10-CM | POA: Insufficient documentation

## 2024-04-06 DIAGNOSIS — I700 Atherosclerosis of aorta: Secondary | ICD-10-CM | POA: Insufficient documentation

## 2024-04-06 DIAGNOSIS — E119 Type 2 diabetes mellitus without complications: Secondary | ICD-10-CM | POA: Diagnosis not present

## 2024-04-06 DIAGNOSIS — R9082 White matter disease, unspecified: Secondary | ICD-10-CM | POA: Insufficient documentation

## 2024-04-06 DIAGNOSIS — C3432 Malignant neoplasm of lower lobe, left bronchus or lung: Secondary | ICD-10-CM | POA: Diagnosis not present

## 2024-04-06 LAB — GLUCOSE, CAPILLARY: Glucose-Capillary: 90 mg/dL (ref 70–99)

## 2024-04-06 MED ORDER — GADOBUTROL 1 MMOL/ML IV SOLN
7.0000 mL | Freq: Once | INTRAVENOUS | Status: AC | PRN
  Administered 2024-04-06: 7 mL via INTRAVENOUS

## 2024-04-06 MED ORDER — FLUDEOXYGLUCOSE F - 18 (FDG) INJECTION
8.9900 | Freq: Once | INTRAVENOUS | Status: AC | PRN
  Administered 2024-04-06: 8.99 via INTRAVENOUS

## 2024-04-07 ENCOUNTER — Inpatient Hospital Stay

## 2024-04-07 ENCOUNTER — Encounter: Payer: Self-pay | Admitting: Oncology

## 2024-04-07 ENCOUNTER — Inpatient Hospital Stay: Attending: Oncology | Admitting: Oncology

## 2024-04-07 ENCOUNTER — Ambulatory Visit
Admission: RE | Admit: 2024-04-07 | Discharge: 2024-04-07 | Disposition: A | Discharge status: Home / Self Care | Source: Ambulatory Visit | Attending: Radiation Oncology | Admitting: Radiation Oncology

## 2024-04-07 ENCOUNTER — Institutional Professional Consult (permissible substitution): Admitting: Radiation Oncology

## 2024-04-07 ENCOUNTER — Encounter: Payer: Self-pay | Admitting: Radiation Oncology

## 2024-04-07 ENCOUNTER — Encounter: Payer: Self-pay | Admitting: *Deleted

## 2024-04-07 VITALS — BP 133/79 | HR 65 | Temp 97.7°F | Resp 16 | Ht 67.0 in | Wt 156.0 lb

## 2024-04-07 DIAGNOSIS — R110 Nausea: Secondary | ICD-10-CM | POA: Diagnosis not present

## 2024-04-07 DIAGNOSIS — G893 Neoplasm related pain (acute) (chronic): Secondary | ICD-10-CM | POA: Insufficient documentation

## 2024-04-07 DIAGNOSIS — F17210 Nicotine dependence, cigarettes, uncomplicated: Secondary | ICD-10-CM | POA: Insufficient documentation

## 2024-04-07 DIAGNOSIS — C3432 Malignant neoplasm of lower lobe, left bronchus or lung: Secondary | ICD-10-CM | POA: Insufficient documentation

## 2024-04-07 DIAGNOSIS — Z510 Encounter for antineoplastic radiation therapy: Secondary | ICD-10-CM | POA: Insufficient documentation

## 2024-04-07 DIAGNOSIS — C3492 Malignant neoplasm of unspecified part of left bronchus or lung: Secondary | ICD-10-CM | POA: Insufficient documentation

## 2024-04-07 DIAGNOSIS — J441 Chronic obstructive pulmonary disease with (acute) exacerbation: Secondary | ICD-10-CM | POA: Diagnosis not present

## 2024-04-07 DIAGNOSIS — R112 Nausea with vomiting, unspecified: Secondary | ICD-10-CM

## 2024-04-07 DIAGNOSIS — R630 Anorexia: Secondary | ICD-10-CM | POA: Insufficient documentation

## 2024-04-07 DIAGNOSIS — J449 Chronic obstructive pulmonary disease, unspecified: Secondary | ICD-10-CM | POA: Insufficient documentation

## 2024-04-07 LAB — COMPREHENSIVE METABOLIC PANEL WITH GFR
ALT: 10 U/L (ref 0–44)
AST: 14 U/L — ABNORMAL LOW (ref 15–41)
Albumin: 3.9 g/dL (ref 3.5–5.0)
Alkaline Phosphatase: 91 U/L (ref 38–126)
Anion gap: 8 (ref 5–15)
BUN: 11 mg/dL (ref 8–23)
CO2: 25 mmol/L (ref 22–32)
Calcium: 9.4 mg/dL (ref 8.9–10.3)
Chloride: 102 mmol/L (ref 98–111)
Creatinine, Ser: 0.88 mg/dL (ref 0.44–1.00)
GFR, Estimated: >60 mL/min (ref >60)
Glucose, Bld: 108 mg/dL — ABNORMAL HIGH (ref 70–99)
Potassium: 4.4 mmol/L (ref 3.5–5.1)
Sodium: 135 mmol/L (ref 135–145)
Total Bilirubin: 0.9 mg/dL (ref 0.0–1.2)
Total Protein: 8.1 g/dL (ref 6.5–8.1)

## 2024-04-07 LAB — CBC WITH DIFFERENTIAL/PLATELET
Abs Immature Granulocytes: 0.06 10*3/uL (ref 0.00–0.07)
Basophils Absolute: 0.1 10*3/uL (ref 0.0–0.1)
Basophils Relative: 1 %
Eosinophils Absolute: 0.1 10*3/uL (ref 0.0–0.5)
Eosinophils Relative: 1 %
HCT: 35.7 % — ABNORMAL LOW (ref 36.0–46.0)
Hemoglobin: 11.6 g/dL — ABNORMAL LOW (ref 12.0–15.0)
Immature Granulocytes: 1 %
Lymphocytes Relative: 29 %
Lymphs Abs: 3.3 10*3/uL (ref 0.7–4.0)
MCH: 27.7 pg (ref 26.0–34.0)
MCHC: 32.5 g/dL (ref 30.0–36.0)
MCV: 85.2 fL (ref 80.0–100.0)
Monocytes Absolute: 1.3 10*3/uL — ABNORMAL HIGH (ref 0.1–1.0)
Monocytes Relative: 12 %
Neutro Abs: 6.3 10*3/uL (ref 1.7–7.7)
Neutrophils Relative %: 56 %
Platelets: 440 10*3/uL — ABNORMAL HIGH (ref 150–400)
RBC: 4.19 MIL/uL (ref 3.87–5.11)
RDW: 15.6 % — ABNORMAL HIGH (ref 11.5–15.5)
WBC: 11.1 10*3/uL — ABNORMAL HIGH (ref 4.0–10.5)
nRBC: 0 % (ref 0.0–0.2)

## 2024-04-07 LAB — LACTATE DEHYDROGENASE: LDH: 114 U/L (ref 98–192)

## 2024-04-07 MED ORDER — ONDANSETRON HCL 4 MG PO TABS: 4.0000 mg | ORAL_TABLET | Freq: Three times a day (TID) | ORAL | 0 refills | Status: AC | PRN

## 2024-04-07 MED ORDER — PREDNISONE 10 MG (21) PO TBPK: ORAL_TABLET | ORAL | 0 refills | Status: DC

## 2024-04-07 MED ORDER — TRAMADOL HCL 50 MG PO TABS: 50.0000 mg | ORAL_TABLET | Freq: Four times a day (QID) | ORAL | 0 refills | Status: DC | PRN

## 2024-04-07 NOTE — Assessment & Plan Note (Signed)
 Recommend tramadol  50 mg every 6 hours as needed. Rationale potential side effects were reviewed and discussed with patient.  Prescription sent to pharmacy.

## 2024-04-07 NOTE — Assessment & Plan Note (Signed)
 Imaging findings and pathology results were reviewed and discussed with patient. 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  PET scan showed no hypermetabolic lymphadenopathy.  Biopsy of station 7 was negative.  Discussed with pulmonology, intra bronchoscopy impression of station 7 was suspicious.  Patient declined surgery evaluation.  She is also not interested in mediastinoscopy evaluation of lymph node involvement. She will see radiation oncology today for radiation.-Per radiation oncology, due to the large tumor size and close to mediastinal structure, SBRT cannot be offered.  She was offered 6 weeks of radiation.

## 2024-04-07 NOTE — Progress Notes (Signed)
 Hematology/Oncology Consult Note [phone] [phone]     REFERRING PROVIDER: Marc Senior, MD    CHIEF COMPLAINTS/PURPOSE OF CONSULTATION:  Left lower lung squamous cell carcinoma  ASSESSMENT & PLAN:   Cancer Staging  Primary lung squamous cell carcinoma, left (HCC) Staging form: Lung, AJCC V9 - Clinical stage from 04/07/2024: Stage IB (cT2a, cN0, cM0) - Signed by Timmy Forbes, MD on 04/07/2024   Primary lung squamous cell carcinoma, left [facility]) Imaging findings and pathology results were reviewed and discussed with patient. 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  PET scan showed no hypermetabolic lymphadenopathy.  Biopsy of station 7 was negative.  Discussed with pulmonology, intra bronchoscopy impression of station 7 was suspicious.  Patient declined surgery evaluation.  She is also not interested in mediastinoscopy evaluation of lymph node involvement. She will see radiation oncology today for radiation.-Per radiation oncology, due to the large tumor size and close to mediastinal structure, SBRT cannot be offered.  She was offered 6 weeks of radiation.  COPD exacerbation (HCC) Recommend a tapering course of steroids.  Decreased appetite Refer to nutritionist.  Hopefully steroid helps increasing appetite.  Nausea without vomiting Recommend Zofran  4 mg every 8 hours as needed.  Neoplasm related pain Recommend tramadol  50 mg every 6 hours as needed. Rationale potential side effects were reviewed and discussed with patient.  Prescription sent to pharmacy.   Orders Placed This Encounter  Procedures   CBC with Differential/Platelet    Standing Status:   Future    Number of Occurrences:   1    Expected Date:   04/07/2024    Expiration Date:   04/07/2025   Comprehensive metabolic panel with GFR    Standing Status:   Future    Number of Occurrences:   1    Expected Date:   04/07/2024    Expiration Date:   04/07/2025    Lactate dehydrogenase    Standing Status:   Future    Number of Occurrences:   1    Expected Date:   04/07/2024    Expiration Date:   04/07/2025   Follow-up to be determined. All questions were answered. The patient knows to call the clinic with any problems, questions or concerns.  Timmy Forbes, MD, PhD [facility] Hematology Oncology 04/07/2024    HISTORY OF PRESENTING ILLNESS:  Caitlyn Herrera 63 y.o. female presents to establish care for lung cancer I have reviewed her chart and materials related to her cancer extensively and collaborated history with the patient. Summary of oncologic history is as follows: Oncology History  Primary lung squamous cell carcinoma, left (HCC)  02/27/2024 Imaging   CT chest without contrast showed 1. Persistent but improved left lower lobe opacity with increasing volume loss, likely improving pneumonia. However, the left lower lobe bronchus is occluded with rounded mass effect on the bronchus by soft tissue density. Poorly marginated soft tissue density in the left infrahilar region in the region of bronchial occlusion. Findings are suspicious for centrally obstructing lesion. Hilar assessment is limited in the absence of IV contrast. Recommend further assessment with bronchoscopy. 2. The left upper lobe nodules on prior exam have resolved.   04/06/2024 Imaging   PET scan showed 1. Large area of hypermetabolic activity involving the left lower lobe bronchus consistent with known primary bronchogenic carcinoma. Persistent left lower lobe collapse with volume loss. 2. No evidence of metastatic disease. 3. Infrarenal abdominal aortic aneurysm measuring up to 3.4 cm.  Recommend surveillance ultrasound in 3 years. Reference: Journal of Vascular Surgery 67.1 (2018): 2-77. J Am Coll Radiol [id number]. 4.  Aortic Atherosclerosis    04/06/2024 Imaging   MRI brain with and without contrast showed moderate nonspecific cerebral white matter disease.  No  evidence of metastatic disease.   04/07/2024 Initial Diagnosis   Primary lung squamous cell carcinoma, left   February 2025, patient was diagnosed with community acquired pneumonia.  Treated with antibiotics.  CT of the chest showed left lower lobe consolidation and a left endobronchial lesion.  03/11/2024, repeat CT chest without contrast showed persistent but improved left lower lobe opacity with increasing volume loss.  Left lower lobe bronchus is occluded with rounded mass effect on the bronchus by soft tissue density.  03/29/2024 patient's status post bronchoscopy biopsy. Lung, biopsy, left mainstem - SQUAMOUS CELL CARCINOMA, KERATINIZING, INVASIVE.  1. Fine Needle Aspiration Lymph Node 7, station 7 NEGATIVE FOR MALIGNANCY 2. Bronchial Brushing, left main stem POSITIVE FOR MALIGNANT CELLS, CONSISTENT WITH SQUAMOUS CELL CARCINOMA. 3. Bronchial Lavage, left POSITIVE FOR MALIGNANT CELLS, CONSISTENT WITH SQUAMOUS CELL CARCINOMA    04/07/2024 Cancer Staging   Staging form: Lung, AJCC V9 - Clinical stage from 04/07/2024: Stage IB (cT2a, cN0, cM0) - Signed by Timmy Forbes, MD on 04/07/2024 Stage prefix: Initial diagnosis Method of lymph node assessment: Clinical    Patient presents to clinic for discussion of pathology reports and treatment plans. She has noticed weight loss about 15 pounds over the past 2 months.  She has no appetite.  She has left anterior chest wall pain, no exacerbating or alleviating factors.  She takes Tylenol  with no improvement. She reports feeling shortness of breath and wheezing.  She uses inhalers.  She is a smoker currently smokes 2 cigarettes/day.       MEDICAL HISTORY:  Past Medical History:  Diagnosis Date   Abdominal aortic ectasia (HCC)    a. 11/2017 CTA chest/abd/pelvis: 2.5 cm abd ao ectasia -->rec f/u u/s in 5 yrs.   Anemia    Angina at rest [facility])    Aortic atherosclerosis (HCC)    Asthma    CAD (coronary artery disease)    a. 11/04/17 Cath: Native  multivessel dzs-->CABG x 3 (LIMA->LAD, VG->OM2, VG->RPDA; b. 11/2017 MV: mid antlat/apical isch; c. 11/2017 Cath: LM 30d, LAD 33m, LCX 80p/m, OM1 90, OM2 80 (@ anastamosis of graft), RCA 80p, 129m, 90d, LIMA->LAD nl, VG->OM2 nl, VG->RPDA nl-->Med Rx.   Carotid arterial disease (HCC)    a. 10/2017 Carotid U/S: 1-30% bilat ICA stenosis.   CHF (congestive heart failure) (HCC)    COPD (chronic obstructive pulmonary disease) (HCC)    DDD (degenerative disc disease), cervical    Dyspnea    GERD (gastroesophageal reflux disease)    History of echocardiogram    a. 11/04/2017 Echo: EF of 65-70%, no RWMA, nl LV diastolic fxn, nl RV size/fxn, mild TR; b. 03/2018 Echo: EF 60-65%, mild LVH, no rwma, mild MR. Mildy reduced RV fxn. Mild TR.   Hyperlipidemia    Hypertension    Left renal artery stenosis (HCC)    a. 11/2017 CTA Chest/Abd/Pelvis: 50-70% L RA stenosis.   Long-term use of aspirin  therapy    Noncompliance with medication treatment due to intermittent use of medication    NSTEMI (non-ST elevated myocardial infarction) (HCC) 10/2017   Palpitations    a. 02/2018 Event Monitor: wore for 2 days, 21 hrs - rare PAC's/PVC's. Avg HR 66 (42-103); b. 04/2018 Event Monitor: Wore for 14 days.  RSR, 67 (49-111). No significant arrhythmias.   Persistent cough    Pulmonary HTN (HCC)    Pulmonary nodule, right    a. 10/2017 CT Chest: 5mm pulm nodule in R lung apex - rec f/u w/ non-contrast chest CT in 1 year.   S/P CABG x 3 11/12/2017   a.) LIMA-LAD, SVG-OM2, SVG-RPDA   Sepsis (HCC)    Streptococcal pneumonia (HCC) 12/19/2023   Thrombocytosis    Tobacco abuse    Type II diabetes mellitus (HCC)     SURGICAL HISTORY: Past Surgical History:  Procedure Laterality Date   BREAST BIOPSY Right 06/23/2018   US  guided biopsy - pathology benign   BREAST CYST EXCISION Right    CORONARY ARTERY BYPASS GRAFT N/A 11/12/2017   Procedure: CORONARY ARTERY BYPASS GRAFTING (CABG) x Three , using left internal mammary artery and  right leg greater saphenous vein harvested endoscopically;  Surgeon: Heriberto London, MD;  Location: [facility] OR;  Service: Open Heart Surgery;  Laterality: N/A;   DILATION AND CURETTAGE OF UTERUS     FLEXIBLE BRONCHOSCOPY Left 03/29/2024   Procedure: BRONCHOSCOPY, FLEXIBLE;  Surgeon: Marc Senior, MD;  Location: [facility] ORS;  Service: Pulmonary;  Laterality: Left;   LAPAROSCOPIC CHOLECYSTECTOMY     LEFT HEART CATH AND CORONARY ANGIOGRAPHY N/A 11/04/2017   Procedure: LEFT HEART CATH AND CORONARY ANGIOGRAPHY;  Surgeon: Sammy Crisp, MD;  Location: [facility] INVASIVE CV LAB;  Service: Cardiovascular;  Laterality: N/A;   LEFT HEART CATH AND CORS/GRAFTS ANGIOGRAPHY N/A 12/09/2017   Procedure: LEFT HEART CATH AND CORS/GRAFTS ANGIOGRAPHY;  Surgeon: [name], Peter M, MD;  Location: [facility] INVASIVE CV LAB;  Service: Cardiovascular;  Laterality: N/A;   RIGHT/LEFT HEART CATH AND CORONARY ANGIOGRAPHY Bilateral 10/08/2022   Procedure: RIGHT/LEFT HEART CATH AND CORONARY ANGIOGRAPHY;  Surgeon: Sammy Crisp, MD;  Location: [facility] INVASIVE CV LAB;  Service: Cardiovascular;  Laterality: Bilateral;   TEE WITHOUT CARDIOVERSION N/A 11/12/2017   Procedure: TRANSESOPHAGEAL ECHOCARDIOGRAM (TEE);  Surgeon: Matt Song, Donata Fryer, MD;  Location: [facility] OR;  Service: Open Heart Surgery;  Laterality: N/A;   VIDEO BRONCHOSCOPY WITH ENDOBRONCHIAL ULTRASOUND Left 03/29/2024   Procedure: BRONCHOSCOPY, WITH EBUS;  Surgeon: Marc Senior, MD;  Location: [facility] ORS;  Service: Pulmonary;  Laterality: Left;    SOCIAL HISTORY: Social History   Socioeconomic History   Marital status: Widowed    Spouse name: Not on file   Number of children: Not on file   Years of education: Not on file   Highest education level: Not on file  Occupational History    Employer: [employer]  Tobacco Use   Smoking status: Every Day    Current packs/day: 0.50    Average packs/day: 0.5 packs/day for 45.4 years (22.7 ttl pk-yrs)    Types: Cigarettes     Start date: 1980   Smokeless tobacco: Never   Tobacco comments:    5 cigarettes a day- khj 01/19/2024. Smoking some days 4 cigarettes a day. 03/16/2024. sd  Vaping Use   Vaping status: Never Used  Substance and Sexual Activity   Alcohol use: Not Currently    Comment: occassionally   Drug use: No   Sexual activity: Never  Other Topics Concern   Not on file  Social History Narrative   Not on file   Social Drivers of Health   Financial Resource Strain: Not on file  Food Insecurity: No Food Insecurity (12/19/2023)   Hunger Vital Sign    Worried About Running Out of Food in the Last  Year: Never true    Ran Out of Food in the Last Year: Never true  Transportation Needs: No Transportation Needs (12/19/2023)   PRAPARE - Administrator, Civil Service (Medical): No    Lack of Transportation (Non-Medical): No  Physical Activity: Not on file  Stress: Not on file  Social Connections: Moderately Isolated (12/19/2023)   Social Connection and Isolation Panel [NHANES]    Frequency of Communication with Friends and Family: Three times a week    Frequency of Social Gatherings with Friends and Family: Twice a week    Attends Religious Services: 1 to 4 times per year    Active Member of [employer] or Organizations: No    Attends Banker Meetings: Never    Marital Status: Widowed  Intimate Partner Violence: Not At Risk (12/19/2023)   Humiliation, Afraid, Rape, and Kick questionnaire    Fear of Current or Ex-Partner: No    Emotionally Abused: No    Physically Abused: No    Sexually Abused: No    FAMILY HISTORY: Family History  Problem Relation Age of Onset   Hypertension Mother    Heart Problems Mother    Breast cancer Neg Hx     ALLERGIES:  has no known allergies.  MEDICATIONS:  Current Outpatient Medications  Medication Sig Dispense Refill   predniSONE  (STERAPRED UNI-PAK 21 TAB) 10 MG (21) TBPK tablet Day 1 take 6 tablets, Day 2 take 5 tablets, Day 3 take 4 tablets,  Day 4 take 3 tablets, Day 5 take 2 tablets D6 take 1 tablet 1 each 0   traMADol  (ULTRAM ) 50 MG tablet Take 1 tablet (50 mg total) by mouth every 6 (six) hours as needed. 60 tablet 0   albuterol  (VENTOLIN  HFA) 108 (90 Base) MCG/ACT inhaler Inhale 2 puffs into the lungs every 6 (six) hours as needed for wheezing or shortness of breath. (Patient taking differently: Inhale 2 puffs into the lungs every 6 (six) hours as needed for wheezing or shortness of breath. 200 is now the doeage) 8 g 2   aspirin  EC 81 MG tablet Take 1 tablet (81 mg total) by mouth daily. 90 tablet 3   atorvastatin  (LIPITOR ) 80 MG tablet TAKE 1 TABLET (80 MG TOTAL) BY MOUTH DAILY. (Patient taking differently: Take 80 mg by mouth every morning.) 30 tablet 10   azelastine (ASTELIN) 0.1 % nasal spray Place 2 sprays into both nostrils as needed.     benzonatate  (TESSALON ) 100 MG capsule Take 1 capsule (100 mg total) by mouth 2 (two) times daily as needed for cough. 20 capsule 2   carvedilol  (COREG ) 12.5 MG tablet TAKE 1 TABLET BY MOUTH TWICE DAILY *PATIENT NEEDS APPOINTMENT* 60 tablet 11   cetirizine (ZYRTEC) 10 MG tablet Take 10 mg by mouth every morning.     citalopram  (CELEXA ) 40 MG tablet Take 40 mg by mouth every morning.     cyclobenzaprine (FLEXERIL) 10 MG tablet Take 10 mg by mouth 3 (three) times daily as needed.     ezetimibe  (ZETIA ) 10 MG tablet TAKE 1 TABLET(10 MG) BY MOUTH DAILY (Patient taking differently: Take 10 mg by mouth every morning. TAKE 1 TABLET(10 MG) BY MOUTH DAILY) 30 tablet 10   fluticasone  (FLONASE) 50 MCG/ACT nasal spray 2 sprays as needed.     Fluticasone -Umeclidin-Vilant (TRELEGY ELLIPTA ) 200-62.5-25 MCG/ACT AEPB Inhale 1 puff into the lungs daily. (Patient taking differently: Inhale 1 puff into the lungs every morning.) 60 each 11   gabapentin  (NEURONTIN ) 300  MG capsule Take 300 mg by mouth every morning. Has not started.     isosorbide  mononitrate (IMDUR ) 30 MG 24 hr tablet Take 0.5 tablets (15 mg total) by  mouth daily. (Patient taking differently: Take 15 mg by mouth every morning.) 45 tablet 3   losartan  (COZAAR ) 25 MG tablet Take 0.5 tablets (12.5 mg total) by mouth daily. (Patient taking differently: Take 12.5 mg by mouth every morning.) 45 tablet 3   meloxicam (MOBIC) 15 MG tablet Take 15 mg by mouth daily as needed.     nitroGLYCERIN  (NITROSTAT ) 0.4 MG SL tablet DISSOLVE 1 TABLET UNDER THE TONGUE AS NEEDED FOR CHEST PAIN EVERY 5 MINUTES UP TO 3 TIMES. IF NO RELIEF CALL 911. 25 tablet 1   omeprazole  (PRILOSEC) 40 MG capsule TAKE 1 CAPSULE BY MOUTH EVERY DAY BEFORE BREAKFAST (Patient taking differently: 40 mg every morning.) 30 capsule 1   ondansetron  (ZOFRAN ) 4 MG tablet Take 1 tablet (4 mg total) by mouth every 8 (eight) hours as needed for nausea or vomiting. 60 tablet 0   SitaGLIPtin-MetFORMIN  HCl (JANUMET XR) 50-1000 MG TB24 Take 1 tablet by mouth every morning.     tiZANidine (ZANAFLEX) 2 MG tablet Take 2 mg by mouth 3 (three) times daily.     No current facility-administered medications for this visit.    Review of Systems  Constitutional:  Positive for appetite change, fatigue and unexpected weight change. Negative for chills and fever.  HENT:   Negative for hearing loss and voice change.   Eyes:  Negative for eye problems.  Respiratory:  Positive for cough and shortness of breath. Negative for chest tightness.   Cardiovascular:  Positive for chest pain.  Gastrointestinal:  Negative for abdominal distention, abdominal pain and blood in stool.  Endocrine: Negative for hot flashes.  Genitourinary:  Negative for difficulty urinating and frequency.   Musculoskeletal:  Negative for arthralgias.  Skin:  Negative for itching and rash.  Neurological:  Negative for extremity weakness.  Hematological:  Negative for adenopathy.  Psychiatric/Behavioral:  Negative for confusion.      PHYSICAL EXAMINATION: ECOG PERFORMANCE STATUS: 1 - Symptomatic but completely ambulatory  Vitals:    04/07/24 1008  BP: 133/79  Pulse: 65  Resp: 16  Temp: 97.7 F (36.5 C)  SpO2: 100%   Filed Weights   04/07/24 1008  Weight: 156 lb (70.8 kg)    Physical Exam Constitutional:      General: She is not in acute distress.    Appearance: She is not diaphoretic.  HENT:     Head: Normocephalic and atraumatic.  Eyes:     General: No scleral icterus. Cardiovascular:     Rate and Rhythm: Normal rate and regular rhythm.     Heart sounds: No murmur heard. Pulmonary:     Effort: Pulmonary effort is normal. No respiratory distress.     Breath sounds: Wheezing present. No rales.     Comments: Decreased breath sounds bilaterally Abdominal:     General: There is no distension.     Palpations: Abdomen is soft.     Tenderness: There is no abdominal tenderness.  Musculoskeletal:        General: Normal range of motion.     Cervical back: Normal range of motion and neck supple.  Skin:    General: Skin is warm and dry.     Findings: No erythema.  Neurological:     Mental Status: She is alert and oriented to person, place, and time.  Cranial Nerves: No cranial nerve deficit.     Motor: No abnormal muscle tone.     Coordination: Coordination normal.  Psychiatric:        Mood and Affect: Affect normal.      LABORATORY DATA:  I have reviewed the data as listed    Latest Ref Rng & Units 04/07/2024   11:44 AM 02/04/2024    3:08 PM 12/21/2023    4:13 AM  CBC  WBC 4.0 - 10.5 K/uL 11.1  8.1  14.9   Hemoglobin 12.0 - 15.0 g/dL 46.9  62.9  8.8   Hematocrit 36.0 - 46.0 % 35.7  35.1  26.6   Platelets 150 - 400 K/uL 440  501  370       Latest Ref Rng & Units 04/07/2024   11:44 AM 12/21/2023    4:13 AM 12/20/2023    5:15 AM  CMP  Glucose 70 - 99 mg/dL 528  74  413   BUN 8 - 23 mg/dL 11  12  11    Creatinine 0.44 - 1.00 mg/dL 2.44  0.10  2.72   Sodium 135 - 145 mmol/L 135  138  139   Potassium 3.5 - 5.1 mmol/L 4.4  3.7  4.0   Chloride 98 - 111 mmol/L 102  109  109   CO2 22 - 32 mmol/L 25   23  25    Calcium  8.9 - 10.3 mg/dL 9.4  8.4  8.4   Total Protein 6.5 - 8.1 g/dL 8.1     Total Bilirubin 0.0 - 1.2 mg/dL 0.9     Alkaline Phos 38 - 126 U/L 91     AST 15 - 41 U/L 14     ALT 0 - 44 U/L 10        RADIOGRAPHIC STUDIES: I have personally reviewed the radiological images as listed and agreed with the findings in the report. MR BRAIN W WO CONTRAST Result Date: 04/06/2024 CLINICAL DATA:  Non-small cell lung cancer (NSCLC), staging EXAM: MRI HEAD WITHOUT AND WITH CONTRAST TECHNIQUE: Multiplanar, multiecho pulse sequences of the brain and surrounding structures were obtained without and with intravenous contrast. CONTRAST:  7mL GADAVIST GADOBUTROL 1 MMOL/ML IV SOLN COMPARISON:  CT of the head dated September 16, 2022. FINDINGS: Brain: There is age related cerebral volume loss present. There is moderate diffuse cerebral white matter disease, particularly involving the periatrial white matter. There is no evidence of hemorrhage, mass, cortical infarct or hydrocephalus. There is no evidence of metastatic disease to the brain, calvaria or skull base. Vascular: Normal flow voids. Skull and upper cervical spine: Unremarkable. Sinuses/Orbits: Negative. Other: None. IMPRESSION: Moderate nonspecific cerebral white matter disease. No evidence of metastatic disease. Electronically Signed   By: Maribeth Shivers M.D.   On: 04/06/2024 16:04   NM PET Image Initial (PI) Skull Base To Thigh (F-18 FDG) Result Date: 04/06/2024 CLINICAL DATA:  Initial treatment strategy for non-small cell lung cancer. Left lower lobe collapse with endobronchial lesion and squamous cell carcinoma on biopsy last week. EXAM: NUCLEAR MEDICINE PET SKULL BASE TO THIGH TECHNIQUE: 8.99 mCi F-18 FDG was injected intravenously. Full-ring PET imaging was performed from the skull base to thigh after the radiotracer. CT data was obtained and used for attenuation correction and anatomic localization. Fasting blood glucose: 90 mg/dl COMPARISON:   Chest CT 02/27/2024 and 12/19/2023. Abdominopelvic CT 12/19/2023. FINDINGS: Mediastinal blood pool activity: SUV max 2.7 NECK: No hypermetabolic cervical lymph nodes are identified.Fairly symmetric activity within the lymphoid tissue  of Waldeyer's ring is within physiologic limits. No suspicious activity identified within the pharyngeal mucosal space. Incidental CT findings: Bilateral carotid atherosclerosis. CHEST: There is a large area of hypermetabolic activity involving the left lower lobe bronchus. This has an SUV max of 27.9 and extends approximately 3.8 cm in length. Persistent left lower lobe collapse with volume loss. No hypermetabolic pulmonary activity or suspicious nodularity peripherally. No discrete hypermetabolic mediastinal, hilar or axillary lymph nodes. Incidental CT findings: Status post median sternotomy and CABG. Atherosclerosis of the aorta, great vessels and coronary arteries. Mild left upper lobe central airway thickening without associated hypermetabolic activity. ABDOMEN/PELVIS: There is no hypermetabolic activity within the liver, adrenal glands, spleen or pancreas. There is no hypermetabolic nodal activity in the abdomen or pelvis. Bowel activity within physiologic limits. Incidental CT findings: Status post cholecystectomy. Diffuse aortic and branch vessel atherosclerosis with an infrarenal abdominal aortic aneurysm measuring up to 3.4 cm on image 95/6. SKELETON: There is no hypermetabolic activity to suggest osseous metastatic disease. Incidental CT findings: Mild spondylosis. IMPRESSION: 1. Large area of hypermetabolic activity involving the left lower lobe bronchus consistent with known primary bronchogenic carcinoma. Persistent left lower lobe collapse with volume loss. 2. No evidence of metastatic disease. 3. Infrarenal abdominal aortic aneurysm measuring up to 3.4 cm. Recommend surveillance ultrasound in 3 years. Reference: Journal of Vascular Surgery 67.1 (2018): 2-77. J Am Coll  Radiol 2013;10:789-794. 4.  Aortic Atherosclerosis (ICD10-I70.0). Electronically Signed   By: Elmon Hagedorn M.D.   On: 04/06/2024 15:41   DG Chest Port 1 View Result Date: 03/29/2024 CLINICAL DATA:  Status post bronch cough syncope EXAM: PORTABLE CHEST 1 VIEW COMPARISON:  December 19, 2023 FINDINGS: Left lower lobe retrocardiac atelectasis without acute infiltrates or consolidations or pulmonary edema No pneumothorax Heart and mediastinum normal IMPRESSION: Left lower lobe retrocardiac atelectasis without acute infiltrates or consolidations or pulmonary edema. Electronically Signed   By: Fredrich Jefferson M.D.   On: 03/29/2024 15:51

## 2024-04-07 NOTE — Progress Notes (Signed)
 Met with patient and her daughter during new patient visit with Dr. Wilhelmenia Harada and Dr. Jacalyn Martin. All questions answered during visit. Reviewed upcoming appts. Pt given resources regarding diagnosis and supportive services available. Contact info given and instructed to call with any questions or needs. Pt verbalized understanding. Nothing further needed at this time.

## 2024-04-07 NOTE — Assessment & Plan Note (Signed)
Recommend Zofran 4 mg every 8 hours as needed.

## 2024-04-07 NOTE — Consult Note (Signed)
 NEW PATIENT EVALUATION  Name: Caitlyn Herrera  MRN: [id number]  Date:   04/07/2024     DOB: [birth year]   This 63 y.o. female patient presents to the clinic for initial evaluation of stage Ib (cT2a N0 M0) squamous cell carcinoma the left lung.  REFERRING PHYSICIAN: [facility]  CHIEF COMPLAINT:  Chief Complaint  Patient presents with   Lung Cancer    DIAGNOSIS: The encounter diagnosis was Primary lung squamous cell carcinoma, left (HCC).   PREVIOUS INVESTIGATIONS:  CT scans PET CT scan reviewed Pathology and cytology reports reviewed Clinical notes reviewed  HPI: Patient is a 63 year old female patient of Dr. Viva Grise followed for COPD with at asthma overlap.  She had a CT scan back in February showing an infrahilar masslike fullness of the left lower lobe concerning for malignancy.  She was admitted to [facility] for streptococcal pneumonia at that time.  She did develop some collapse of the left lower lobe.  She recently underwent bronchoscopy by Dr. Viva Grise showing extensive submucosal mass throughout the left mainstem bronchus carcinoma until proven otherwise. Pathology was positive for squamous cell carcinoma.  PET scan confirmed the larger of hypermetabolic activity involving the left lower lobe rhonchus consistent with known primary bronchogenic carcinoma.  She also persistent left lower lobe collapse with volume loss.  No evidence of metastatic disease or mediastinal adenopathy.  Brain MRI showed no evidence of intracranial metastasis.  She has been seen by Dr. Wilhelmenia Harada who has deferred her to radiation oncology for consideration of treatment she will not receive systemic treatment.  Patient has multiple medical comorbidities including diabetes mellitus without complications history of sepsis coronary artery disease congestive heart failure pulmonary hypertension ischemic cardiomyopathy unstable angina hyperlipidemia status post CABG.  PLANNED TREATMENT REGIMEN: 3D radiation  therapy to lung primary  PAST MEDICAL HISTORY:  has a past medical history of Abdominal aortic ectasia (HCC), Anemia, Angina at rest [facility]), Aortic atherosclerosis (HCC), Asthma, CAD (coronary artery disease), Carotid arterial disease (HCC), CHF (congestive heart failure) (HCC), COPD (chronic obstructive pulmonary disease) (HCC), DDD (degenerative disc disease), cervical, Dyspnea, GERD (gastroesophageal reflux disease), History of echocardiogram, Hyperlipidemia, Hypertension, Left renal artery stenosis (HCC), Long-term use of aspirin  therapy, Noncompliance with medication treatment due to intermittent use of medication, NSTEMI (non-ST elevated myocardial infarction) (HCC) (10/2017), Palpitations, Persistent cough, Pulmonary HTN (HCC), Pulmonary nodule, right, S/P CABG x 3 (11/12/2017), Sepsis (HCC), Streptococcal pneumonia (HCC) (12/19/2023), Thrombocytosis, Tobacco abuse, and Type II diabetes mellitus (HCC).    PAST SURGICAL HISTORY:  Past Surgical History:  Procedure Laterality Date   BREAST BIOPSY Right 06/23/2018   US  guided biopsy - pathology benign   BREAST CYST EXCISION Right    CORONARY ARTERY BYPASS GRAFT N/A 11/12/2017   Procedure: CORONARY ARTERY BYPASS GRAFTING (CABG) x Three , using left internal mammary artery and right leg greater saphenous vein harvested endoscopically;  Surgeon: Heriberto London, MD;  Location: [facility] OR;  Service: Open Heart Surgery;  Laterality: N/A;   DILATION AND CURETTAGE OF UTERUS     FLEXIBLE BRONCHOSCOPY Left 03/29/2024   Procedure: BRONCHOSCOPY, FLEXIBLE;  Surgeon: Marc Senior, MD;  Location: [facility] ORS;  Service: Pulmonary;  Laterality: Left;   LAPAROSCOPIC CHOLECYSTECTOMY     LEFT HEART CATH AND CORONARY ANGIOGRAPHY N/A 11/04/2017   Procedure: LEFT HEART CATH AND CORONARY ANGIOGRAPHY;  Surgeon: Sammy Crisp, MD;  Location: [facility] INVASIVE CV LAB;  Service: Cardiovascular;  Laterality: N/A;   LEFT HEART CATH AND CORS/GRAFTS ANGIOGRAPHY N/A 12/09/2017  Procedure: LEFT HEART CATH AND CORS/GRAFTS ANGIOGRAPHY;  Surgeon: [name], Peter M, MD;  Location: [facility] INVASIVE CV LAB;  Service: Cardiovascular;  Laterality: N/A;   RIGHT/LEFT HEART CATH AND CORONARY ANGIOGRAPHY Bilateral 10/08/2022   Procedure: RIGHT/LEFT HEART CATH AND CORONARY ANGIOGRAPHY;  Surgeon: Sammy Crisp, MD;  Location: [facility] INVASIVE CV LAB;  Service: Cardiovascular;  Laterality: Bilateral;   TEE WITHOUT CARDIOVERSION N/A 11/12/2017   Procedure: TRANSESOPHAGEAL ECHOCARDIOGRAM (TEE);  Surgeon: Matt Song, Donata Fryer, MD;  Location: [facility] OR;  Service: Open Heart Surgery;  Laterality: N/A;   VIDEO BRONCHOSCOPY WITH ENDOBRONCHIAL ULTRASOUND Left 03/29/2024   Procedure: BRONCHOSCOPY, WITH EBUS;  Surgeon: Marc Senior, MD;  Location: [facility] ORS;  Service: Pulmonary;  Laterality: Left;    FAMILY HISTORY: family history includes Heart Problems in her mother; Hypertension in her mother.  SOCIAL HISTORY:  reports that she has been smoking cigarettes. She started smoking about 45 years ago. She has a 22.7 pack-year smoking history. She has never used smokeless tobacco. She reports that she does not currently use alcohol. She reports that she does not use drugs.  ALLERGIES: Patient has no known allergies.  MEDICATIONS:  Current Outpatient Medications  Medication Sig Dispense Refill   albuterol  (VENTOLIN  HFA) 108 (90 Base) MCG/ACT inhaler Inhale 2 puffs into the lungs every 6 (six) hours as needed for wheezing or shortness of breath. (Patient taking differently: Inhale 2 puffs into the lungs every 6 (six) hours as needed for wheezing or shortness of breath. 200 is now the doeage) 8 g 2   aspirin  EC 81 MG tablet Take 1 tablet (81 mg total) by mouth daily. 90 tablet 3   atorvastatin  (LIPITOR ) 80 MG tablet TAKE 1 TABLET (80 MG TOTAL) BY MOUTH DAILY. (Patient taking differently: Take 80 mg by mouth every morning.) 30 tablet 10   azelastine (ASTELIN) 0.1 % nasal spray Place 2 sprays into both nostrils as  needed.     benzonatate  (TESSALON ) 100 MG capsule Take 1 capsule (100 mg total) by mouth 2 (two) times daily as needed for cough. 20 capsule 2   carvedilol  (COREG ) 12.5 MG tablet TAKE 1 TABLET BY MOUTH TWICE DAILY *PATIENT NEEDS APPOINTMENT* 60 tablet 11   cetirizine (ZYRTEC) 10 MG tablet Take 10 mg by mouth every morning.     citalopram  (CELEXA ) 40 MG tablet Take 40 mg by mouth every morning.     cyclobenzaprine (FLEXERIL) 10 MG tablet Take 10 mg by mouth 3 (three) times daily as needed.     ezetimibe  (ZETIA ) 10 MG tablet TAKE 1 TABLET(10 MG) BY MOUTH DAILY (Patient taking differently: Take 10 mg by mouth every morning. TAKE 1 TABLET(10 MG) BY MOUTH DAILY) 30 tablet 10   fluticasone  (FLONASE) 50 MCG/ACT nasal spray 2 sprays as needed.     Fluticasone -Umeclidin-Vilant (TRELEGY ELLIPTA ) 200-62.5-25 MCG/ACT AEPB Inhale 1 puff into the lungs daily. (Patient taking differently: Inhale 1 puff into the lungs every morning.) 60 each 11   gabapentin  (NEURONTIN ) 300 MG capsule Take 300 mg by mouth every morning. Has not started.     isosorbide  mononitrate (IMDUR ) 30 MG 24 hr tablet Take 0.5 tablets (15 mg total) by mouth daily. (Patient taking differently: Take 15 mg by mouth every morning.) 45 tablet 3   losartan  (COZAAR ) 25 MG tablet Take 0.5 tablets (12.5 mg total) by mouth daily. (Patient taking differently: Take 12.5 mg by mouth every morning.) 45 tablet 3   meloxicam (MOBIC) 15 MG tablet Take 15 mg by mouth daily as needed.  nitroGLYCERIN  (NITROSTAT ) 0.4 MG SL tablet DISSOLVE 1 TABLET UNDER THE TONGUE AS NEEDED FOR CHEST PAIN EVERY 5 MINUTES UP TO 3 TIMES. IF NO RELIEF CALL 911. 25 tablet 1   omeprazole  (PRILOSEC) 40 MG capsule TAKE 1 CAPSULE BY MOUTH EVERY DAY BEFORE BREAKFAST (Patient taking differently: 40 mg every morning.) 30 capsule 1   ondansetron  (ZOFRAN ) 4 MG tablet Take 1 tablet (4 mg total) by mouth every 8 (eight) hours as needed for nausea or vomiting. 60 tablet 0   predniSONE  (STERAPRED  UNI-PAK 21 TAB) 10 MG (21) TBPK tablet Day 1 take 6 tablets, Day 2 take 5 tablets, Day 3 take 4 tablets, Day 4 take 3 tablets, Day 5 take 2 tablets D6 take 1 tablet 1 each 0   SitaGLIPtin-MetFORMIN  HCl (JANUMET XR) 50-1000 MG TB24 Take 1 tablet by mouth every morning.     tiZANidine (ZANAFLEX) 2 MG tablet Take 2 mg by mouth 3 (three) times daily.     traMADol  (ULTRAM ) 50 MG tablet Take 1 tablet (50 mg total) by mouth every 6 (six) hours as needed. 60 tablet 0   No current facility-administered medications for this encounter.    ECOG PERFORMANCE STATUS:  1 - Symptomatic but completely ambulatory  REVIEW OF SYSTEMS: Patient denies any weight loss, fatigue, weakness, fever, chills or night sweats. Patient denies any loss of vision, blurred vision. Patient denies any ringing  of the ears or hearing loss. No irregular heartbeat. Patient denies heart murmur or history of fainting. Patient denies any chest pain or pain radiating to her upper extremities. Patient denies any shortness of breath, difficulty breathing at night, cough or hemoptysis. Patient denies any swelling in the lower legs. Patient denies any nausea vomiting, vomiting of blood, or coffee ground material in the vomitus. Patient denies any stomach pain. Patient states has had normal bowel movements no significant constipation or diarrhea. Patient denies any dysuria, hematuria or significant nocturia. Patient denies any problems walking, swelling in the joints or loss of balance. Patient denies any skin changes, loss of hair or loss of weight. Patient denies any excessive worrying or anxiety or significant depression. Patient denies any problems with insomnia. Patient denies excessive thirst, polyuria, polydipsia. Patient denies any swollen glands, patient denies easy bruising or easy bleeding. Patient denies any recent infections, allergies or URI. Patient s visual fields have not changed significantly in recent time.   PHYSICAL EXAM: There  were no vitals taken for this visit. Patient has some coarse rhonchi's in her left lung field.  Well-developed well-nourished patient in NAD. HEENT reveals PERLA, EOMI, discs not visualized.  Oral cavity is clear. No oral mucosal lesions are identified. Neck is clear without evidence of cervical or supraclavicular adenopathy. Lungs are clear to A&P. Cardiac examination is essentially unremarkable with regular rate and rhythm without murmur rub or thrill. Abdomen is benign with no organomegaly or masses noted. Motor sensory and DTR levels are equal and symmetric in the upper and lower extremities. Cranial nerves II through XII are grossly intact. Proprioception is intact. No peripheral adenopathy or edema is identified. No motor or sensory levels are noted. Crude visual fields are within normal range.  LABORATORY DATA: Pathology cytology reports reviewed    RADIOLOGY RESULTS: CT scans PET CT scans and MRI of brain all reviewed compatible with above-stated findings   IMPRESSION: Stage Ib squamous cell carcinoma of the left lung in 63 year old female with multiple medical comorbidities  PLAN: At this time I have recommended radiation therapy to  her left lung.  Tumor is too large and too close to mediastinal structures including heart and esophagus for SBRT treatment.  Will plan on 40 Gray over 6 weeks using 3-dimensional treatment planning.  Risks and benefits of treatment including increasing cough fatigue alteration of blood counts skin reaction possible dysphagia from radiation esophagitis all were reviewed in detail with the patient.  She seems to comprehend the treatment plan well.  I have personally set up and ordered CT simulation for next week.  I would like to take this opportunity to thank you for allowing me to participate in the care of your patient.Glenis Langdon, MD

## 2024-04-07 NOTE — Assessment & Plan Note (Signed)
 Refer to nutritionist.  Hopefully steroid helps increasing appetite.

## 2024-04-07 NOTE — Progress Notes (Signed)
 She is having some severe pain on the right upper side of her back, she rates her pain at about a 6 today. Patient has lost her appetite completely. She is having some heaviness in the middle of her chest, and shortness of breath. Wheezing, she said that she makes all kinds of noises when she lays down to go to sleep at night. She is feeling very weak and tired. For her appetite they would like to know if there is something we can do to help, she doesn't like the ensure or boost drinks.

## 2024-04-07 NOTE — Assessment & Plan Note (Signed)
 Recommend a tapering course of steroids.

## 2024-04-08 ENCOUNTER — Ambulatory Visit: Admitting: Oncology

## 2024-04-08 ENCOUNTER — Institutional Professional Consult (permissible substitution): Admitting: Radiation Oncology

## 2024-04-12 ENCOUNTER — Other Ambulatory Visit: Payer: Self-pay | Admitting: *Deleted

## 2024-04-12 NOTE — Progress Notes (Signed)
 Pt added for tumor board discussion per Dr. Wilhelmenia Harada.

## 2024-04-14 ENCOUNTER — Ambulatory Visit
Admission: RE | Admit: 2024-04-14 | Discharge: 2024-04-14 | Disposition: A | Discharge status: Home / Self Care | Source: Ambulatory Visit | Attending: Radiation Oncology | Admitting: Radiation Oncology

## 2024-04-14 ENCOUNTER — Encounter: Payer: Self-pay | Admitting: *Deleted

## 2024-04-14 DIAGNOSIS — F17210 Nicotine dependence, cigarettes, uncomplicated: Secondary | ICD-10-CM | POA: Diagnosis not present

## 2024-04-14 DIAGNOSIS — C3432 Malignant neoplasm of lower lobe, left bronchus or lung: Secondary | ICD-10-CM | POA: Diagnosis not present

## 2024-04-14 DIAGNOSIS — Z510 Encounter for antineoplastic radiation therapy: Secondary | ICD-10-CM | POA: Diagnosis present

## 2024-04-14 NOTE — Progress Notes (Signed)
 [facility] previously ordered per Dr. Wilhelmenia Harada for xR, xT, and PDL1 on recent lung biopsy [phone]. Financial assistance application completed and approved for $0 out of pocket expense.

## 2024-04-15 ENCOUNTER — Ambulatory Visit: Payer: Self-pay | Admitting: Pulmonary Disease

## 2024-04-15 ENCOUNTER — Encounter: Payer: Self-pay | Admitting: *Deleted

## 2024-04-15 ENCOUNTER — Other Ambulatory Visit

## 2024-04-15 NOTE — Progress Notes (Signed)
 Called pt to inform of tumor board recommendations. Informed that it is recommended at this time to pursue radiation treatments to treat her cancer and that she does not need any systemic treatment. Advised that once radiation is complete then will follow up closely with Dr. Wilhelmenia Harada. Pt verbalized understanding.

## 2024-04-16 ENCOUNTER — Other Ambulatory Visit: Payer: Self-pay | Admitting: *Deleted

## 2024-04-16 ENCOUNTER — Encounter: Payer: Self-pay | Admitting: Oncology

## 2024-04-16 DIAGNOSIS — C3492 Malignant neoplasm of unspecified part of left bronchus or lung: Secondary | ICD-10-CM

## 2024-04-19 DIAGNOSIS — Z510 Encounter for antineoplastic radiation therapy: Secondary | ICD-10-CM | POA: Diagnosis not present

## 2024-04-20 ENCOUNTER — Ambulatory Visit: Admitting: Pulmonary Disease

## 2024-04-21 ENCOUNTER — Ambulatory Visit
Admission: RE | Admit: 2024-04-21 | Discharge: 2024-04-21 | Disposition: A | Discharge status: Home / Self Care | Source: Ambulatory Visit | Attending: Radiation Oncology | Admitting: Radiation Oncology

## 2024-04-21 DIAGNOSIS — Z510 Encounter for antineoplastic radiation therapy: Secondary | ICD-10-CM | POA: Diagnosis not present

## 2024-04-22 ENCOUNTER — Encounter: Payer: Self-pay | Admitting: *Deleted

## 2024-04-22 ENCOUNTER — Other Ambulatory Visit: Payer: Self-pay

## 2024-04-22 ENCOUNTER — Ambulatory Visit
Admission: RE | Admit: 2024-04-22 | Discharge: 2024-04-22 | Disposition: A | Discharge status: Home / Self Care | Source: Ambulatory Visit | Attending: Radiation Oncology | Admitting: Radiation Oncology

## 2024-04-22 DIAGNOSIS — Z510 Encounter for antineoplastic radiation therapy: Secondary | ICD-10-CM | POA: Diagnosis not present

## 2024-04-22 LAB — RAD ONC ARIA SESSION SUMMARY
Course Elapsed Days: 0
Plan Fractions Treated to Date: 1
Plan Prescribed Dose Per Fraction: 2 Gy
Plan Total Fractions Prescribed: 33
Plan Total Prescribed Dose: 66 Gy
Reference Point Dosage Given to Date: 2 Gy
Reference Point Session Dosage Given: 2 Gy
Session Number: 1

## 2024-04-23 ENCOUNTER — Ambulatory Visit

## 2024-04-26 ENCOUNTER — Ambulatory Visit
Admission: RE | Admit: 2024-04-26 | Discharge: 2024-04-26 | Disposition: A | Discharge status: Home / Self Care | Source: Ambulatory Visit | Attending: Radiation Oncology | Admitting: Radiation Oncology

## 2024-04-26 ENCOUNTER — Other Ambulatory Visit: Payer: Self-pay

## 2024-04-26 DIAGNOSIS — Z510 Encounter for antineoplastic radiation therapy: Secondary | ICD-10-CM | POA: Diagnosis not present

## 2024-04-26 LAB — RAD ONC ARIA SESSION SUMMARY
Course Elapsed Days: 4
Plan Fractions Treated to Date: 2
Plan Prescribed Dose Per Fraction: 2 Gy
Plan Total Fractions Prescribed: 33
Plan Total Prescribed Dose: 66 Gy
Reference Point Dosage Given to Date: 4 Gy
Reference Point Session Dosage Given: 2 Gy
Session Number: 2

## 2024-04-27 ENCOUNTER — Other Ambulatory Visit: Payer: Self-pay

## 2024-04-27 ENCOUNTER — Ambulatory Visit
Admission: RE | Admit: 2024-04-27 | Discharge: 2024-04-27 | Disposition: A | Discharge status: Home / Self Care | Source: Ambulatory Visit | Attending: Radiation Oncology | Admitting: Radiation Oncology

## 2024-04-27 DIAGNOSIS — C3432 Malignant neoplasm of lower lobe, left bronchus or lung: Secondary | ICD-10-CM | POA: Insufficient documentation

## 2024-04-27 DIAGNOSIS — D75838 Other thrombocytosis: Secondary | ICD-10-CM | POA: Insufficient documentation

## 2024-04-27 DIAGNOSIS — Z510 Encounter for antineoplastic radiation therapy: Secondary | ICD-10-CM | POA: Diagnosis present

## 2024-04-27 DIAGNOSIS — F17210 Nicotine dependence, cigarettes, uncomplicated: Secondary | ICD-10-CM | POA: Insufficient documentation

## 2024-04-27 LAB — RAD ONC ARIA SESSION SUMMARY
Course Elapsed Days: 5
Plan Fractions Treated to Date: 3
Plan Prescribed Dose Per Fraction: 2 Gy
Plan Total Fractions Prescribed: 33
Plan Total Prescribed Dose: 66 Gy
Reference Point Dosage Given to Date: 6 Gy
Reference Point Session Dosage Given: 2 Gy
Session Number: 3

## 2024-04-28 ENCOUNTER — Other Ambulatory Visit: Payer: Self-pay

## 2024-04-28 ENCOUNTER — Ambulatory Visit
Admission: RE | Admit: 2024-04-28 | Discharge: 2024-04-28 | Disposition: A | Discharge status: Home / Self Care | Source: Ambulatory Visit | Attending: Radiation Oncology | Admitting: Radiation Oncology

## 2024-04-28 DIAGNOSIS — Z510 Encounter for antineoplastic radiation therapy: Secondary | ICD-10-CM | POA: Diagnosis not present

## 2024-04-28 LAB — RAD ONC ARIA SESSION SUMMARY
Course Elapsed Days: 6
Plan Fractions Treated to Date: 4
Plan Prescribed Dose Per Fraction: 2 Gy
Plan Total Fractions Prescribed: 33
Plan Total Prescribed Dose: 66 Gy
Reference Point Dosage Given to Date: 8 Gy
Reference Point Session Dosage Given: 2 Gy
Session Number: 4

## 2024-04-28 MED ORDER — AZITHROMYCIN 250 MG PO TABS: ORAL_TABLET | ORAL | 0 refills | Status: DC

## 2024-04-29 ENCOUNTER — Telehealth: Payer: Self-pay

## 2024-04-29 ENCOUNTER — Inpatient Hospital Stay

## 2024-04-29 ENCOUNTER — Ambulatory Visit
Admission: RE | Admit: 2024-04-29 | Discharge: 2024-04-29 | Disposition: A | Discharge status: Home / Self Care | Source: Ambulatory Visit | Attending: Radiation Oncology | Admitting: Radiation Oncology

## 2024-04-29 ENCOUNTER — Other Ambulatory Visit: Payer: Self-pay

## 2024-04-29 DIAGNOSIS — C3432 Malignant neoplasm of lower lobe, left bronchus or lung: Secondary | ICD-10-CM | POA: Insufficient documentation

## 2024-04-29 DIAGNOSIS — Z510 Encounter for antineoplastic radiation therapy: Secondary | ICD-10-CM | POA: Diagnosis not present

## 2024-04-29 DIAGNOSIS — F17200 Nicotine dependence, unspecified, uncomplicated: Secondary | ICD-10-CM | POA: Insufficient documentation

## 2024-04-29 DIAGNOSIS — C3492 Malignant neoplasm of unspecified part of left bronchus or lung: Secondary | ICD-10-CM

## 2024-04-29 DIAGNOSIS — J449 Chronic obstructive pulmonary disease, unspecified: Secondary | ICD-10-CM | POA: Insufficient documentation

## 2024-04-29 LAB — RAD ONC ARIA SESSION SUMMARY
Course Elapsed Days: 7
Plan Fractions Treated to Date: 5
Plan Prescribed Dose Per Fraction: 2 Gy
Plan Total Fractions Prescribed: 33
Plan Total Prescribed Dose: 66 Gy
Reference Point Dosage Given to Date: 10 Gy
Reference Point Session Dosage Given: 2 Gy
Session Number: 5

## 2024-04-29 LAB — CBC (CANCER CENTER ONLY)
HCT: 34.7 % — ABNORMAL LOW (ref 36.0–46.0)
Hemoglobin: 10.9 g/dL — ABNORMAL LOW (ref 12.0–15.0)
MCH: 26.7 pg (ref 26.0–34.0)
MCHC: 31.4 g/dL (ref 30.0–36.0)
MCV: 84.8 fL (ref 80.0–100.0)
Platelet Count: 448 10*3/uL — ABNORMAL HIGH (ref 150–400)
RBC: 4.09 MIL/uL (ref 3.87–5.11)
RDW: 15.4 % (ref 11.5–15.5)
WBC Count: 10.5 10*3/uL (ref 4.0–10.5)
nRBC: 0 % (ref 0.0–0.2)

## 2024-04-29 NOTE — Telephone Encounter (Signed)
 If no pain or other symptoms, then Dr. Babara recommends to start Colace 100 mg BID, if that doesn't work then she can try Miralax  17g daily PRN. If she has pain, Dr.Yu recommends ER for further evaluation.

## 2024-04-29 NOTE — Telephone Encounter (Signed)
 Patient informed and agrees with MD recommends.

## 2024-04-29 NOTE — Telephone Encounter (Signed)
 Please advise:  Received call from nurse case manager Rosina with [employer].  She is concerned with patient reporting constipation for 2 weeks.  Called patient for additional information.  Patient reports last bowel movement was 2 weeks ago.  Denies abdominal pain or discomfort.  Appetite has decreased but still eating.    Does have an appt for XRT at 2:00 this afternoon.

## 2024-05-03 ENCOUNTER — Other Ambulatory Visit: Payer: Self-pay

## 2024-05-03 ENCOUNTER — Ambulatory Visit
Admission: RE | Admit: 2024-05-03 | Discharge: 2024-05-03 | Disposition: A | Discharge status: Home / Self Care | Source: Ambulatory Visit | Attending: Radiation Oncology | Admitting: Radiation Oncology

## 2024-05-03 DIAGNOSIS — Z510 Encounter for antineoplastic radiation therapy: Secondary | ICD-10-CM | POA: Diagnosis not present

## 2024-05-03 LAB — RAD ONC ARIA SESSION SUMMARY
Course Elapsed Days: 11
Plan Fractions Treated to Date: 6
Plan Prescribed Dose Per Fraction: 2 Gy
Plan Total Fractions Prescribed: 33
Plan Total Prescribed Dose: 66 Gy
Reference Point Dosage Given to Date: 12 Gy
Reference Point Session Dosage Given: 2 Gy
Session Number: 6

## 2024-05-04 ENCOUNTER — Other Ambulatory Visit: Payer: Self-pay

## 2024-05-04 ENCOUNTER — Ambulatory Visit
Admission: RE | Admit: 2024-05-04 | Discharge: 2024-05-04 | Disposition: A | Discharge status: Home / Self Care | Source: Ambulatory Visit | Attending: Radiation Oncology | Admitting: Radiation Oncology

## 2024-05-04 DIAGNOSIS — Z510 Encounter for antineoplastic radiation therapy: Secondary | ICD-10-CM | POA: Diagnosis not present

## 2024-05-04 LAB — RAD ONC ARIA SESSION SUMMARY
Course Elapsed Days: 12
Plan Fractions Treated to Date: 7
Plan Prescribed Dose Per Fraction: 2 Gy
Plan Total Fractions Prescribed: 33
Plan Total Prescribed Dose: 66 Gy
Reference Point Dosage Given to Date: 14 Gy
Reference Point Session Dosage Given: 2 Gy
Session Number: 7

## 2024-05-05 ENCOUNTER — Other Ambulatory Visit: Payer: Self-pay

## 2024-05-05 ENCOUNTER — Ambulatory Visit
Admission: RE | Admit: 2024-05-05 | Discharge: 2024-05-05 | Disposition: A | Discharge status: Home / Self Care | Source: Ambulatory Visit | Attending: Radiation Oncology | Admitting: Radiation Oncology

## 2024-05-05 DIAGNOSIS — Z510 Encounter for antineoplastic radiation therapy: Secondary | ICD-10-CM | POA: Diagnosis not present

## 2024-05-05 LAB — RAD ONC ARIA SESSION SUMMARY
Course Elapsed Days: 13
Plan Fractions Treated to Date: 8
Plan Prescribed Dose Per Fraction: 2 Gy
Plan Total Fractions Prescribed: 33
Plan Total Prescribed Dose: 66 Gy
Reference Point Dosage Given to Date: 16 Gy
Reference Point Session Dosage Given: 2 Gy
Session Number: 8

## 2024-05-06 ENCOUNTER — Other Ambulatory Visit: Payer: Self-pay

## 2024-05-06 ENCOUNTER — Inpatient Hospital Stay

## 2024-05-06 ENCOUNTER — Ambulatory Visit
Admission: RE | Admit: 2024-05-06 | Discharge: 2024-05-06 | Disposition: A | Discharge status: Home / Self Care | Source: Ambulatory Visit | Attending: Radiation Oncology | Admitting: Radiation Oncology

## 2024-05-06 ENCOUNTER — Encounter: Payer: Self-pay | Admitting: Oncology

## 2024-05-06 DIAGNOSIS — C3492 Malignant neoplasm of unspecified part of left bronchus or lung: Secondary | ICD-10-CM

## 2024-05-06 DIAGNOSIS — Z510 Encounter for antineoplastic radiation therapy: Secondary | ICD-10-CM | POA: Diagnosis not present

## 2024-05-06 LAB — CBC (CANCER CENTER ONLY)
HCT: 35.1 % — ABNORMAL LOW (ref 36.0–46.0)
Hemoglobin: 11.1 g/dL — ABNORMAL LOW (ref 12.0–15.0)
MCH: 26.6 pg (ref 26.0–34.0)
MCHC: 31.6 g/dL (ref 30.0–36.0)
MCV: 84 fL (ref 80.0–100.0)
Platelet Count: 587 K/uL — ABNORMAL HIGH (ref 150–400)
RBC: 4.18 MIL/uL (ref 3.87–5.11)
RDW: 15.3 % (ref 11.5–15.5)
WBC Count: 7.5 K/uL (ref 4.0–10.5)
nRBC: 0 % (ref 0.0–0.2)

## 2024-05-06 LAB — RAD ONC ARIA SESSION SUMMARY
Course Elapsed Days: 14
Plan Fractions Treated to Date: 9
Plan Prescribed Dose Per Fraction: 2 Gy
Plan Total Fractions Prescribed: 33
Plan Total Prescribed Dose: 66 Gy
Reference Point Dosage Given to Date: 18 Gy
Reference Point Session Dosage Given: 2 Gy
Session Number: 9

## 2024-05-07 ENCOUNTER — Ambulatory Visit
Admission: RE | Admit: 2024-05-07 | Discharge: 2024-05-07 | Disposition: A | Discharge status: Home / Self Care | Source: Ambulatory Visit | Attending: Radiation Oncology | Admitting: Radiation Oncology

## 2024-05-07 ENCOUNTER — Telehealth: Payer: Self-pay

## 2024-05-07 ENCOUNTER — Other Ambulatory Visit: Payer: Self-pay

## 2024-05-07 DIAGNOSIS — Z510 Encounter for antineoplastic radiation therapy: Secondary | ICD-10-CM | POA: Diagnosis not present

## 2024-05-07 DIAGNOSIS — C3492 Malignant neoplasm of unspecified part of left bronchus or lung: Secondary | ICD-10-CM

## 2024-05-07 LAB — RAD ONC ARIA SESSION SUMMARY
Course Elapsed Days: 15
Plan Fractions Treated to Date: 10
Plan Prescribed Dose Per Fraction: 2 Gy
Plan Total Fractions Prescribed: 33
Plan Total Prescribed Dose: 66 Gy
Reference Point Dosage Given to Date: 20 Gy
Reference Point Session Dosage Given: 2 Gy
Session Number: 10

## 2024-05-07 NOTE — Telephone Encounter (Signed)
 Per MD: for her high platelet counts, please arrange her to see me in 1 -2 weeks with lab prior to MD cbc, smear, iron tibc ferritin, jak2 mutation with reflex.  Per Rosina, pt has been scheduled and notified of appts.

## 2024-05-10 ENCOUNTER — Other Ambulatory Visit: Payer: Self-pay

## 2024-05-10 ENCOUNTER — Ambulatory Visit
Admission: RE | Admit: 2024-05-10 | Discharge: 2024-05-10 | Disposition: A | Discharge status: Home / Self Care | Source: Ambulatory Visit | Attending: Radiation Oncology | Admitting: Radiation Oncology

## 2024-05-10 DIAGNOSIS — Z510 Encounter for antineoplastic radiation therapy: Secondary | ICD-10-CM | POA: Diagnosis not present

## 2024-05-10 LAB — RAD ONC ARIA SESSION SUMMARY
Course Elapsed Days: 18
Plan Fractions Treated to Date: 11
Plan Prescribed Dose Per Fraction: 2 Gy
Plan Total Fractions Prescribed: 33
Plan Total Prescribed Dose: 66 Gy
Reference Point Dosage Given to Date: 22 Gy
Reference Point Session Dosage Given: 2 Gy
Session Number: 11

## 2024-05-11 ENCOUNTER — Ambulatory Visit
Admission: RE | Admit: 2024-05-11 | Discharge: 2024-05-11 | Disposition: A | Discharge status: Home / Self Care | Source: Ambulatory Visit | Attending: Hospice and Palliative Medicine | Admitting: Hospice and Palliative Medicine

## 2024-05-11 ENCOUNTER — Ambulatory Visit
Admission: RE | Admit: 2024-05-11 | Discharge: 2024-05-11 | Disposition: A | Discharge status: Home / Self Care | Source: Ambulatory Visit | Attending: Radiation Oncology | Admitting: Radiation Oncology

## 2024-05-11 ENCOUNTER — Ambulatory Visit (HOSPITAL_BASED_OUTPATIENT_CLINIC_OR_DEPARTMENT_OTHER): Admitting: Hospice and Palliative Medicine

## 2024-05-11 ENCOUNTER — Other Ambulatory Visit: Payer: Self-pay

## 2024-05-11 VITALS — BP 117/77 | HR 70 | Temp 97.8°F | Resp 28 | Wt 159.0 lb

## 2024-05-11 DIAGNOSIS — R0602 Shortness of breath: Secondary | ICD-10-CM | POA: Diagnosis present

## 2024-05-11 DIAGNOSIS — Z510 Encounter for antineoplastic radiation therapy: Secondary | ICD-10-CM | POA: Diagnosis not present

## 2024-05-11 DIAGNOSIS — C3492 Malignant neoplasm of unspecified part of left bronchus or lung: Secondary | ICD-10-CM | POA: Diagnosis not present

## 2024-05-11 LAB — RAD ONC ARIA SESSION SUMMARY
Course Elapsed Days: 19
Plan Fractions Treated to Date: 12
Plan Prescribed Dose Per Fraction: 2 Gy
Plan Total Fractions Prescribed: 33
Plan Total Prescribed Dose: 66 Gy
Reference Point Dosage Given to Date: 24 Gy
Reference Point Session Dosage Given: 2 Gy
Session Number: 12

## 2024-05-11 MED ORDER — METHYLPREDNISOLONE 4 MG PO TBPK: ORAL_TABLET | ORAL | 0 refills | Status: DC

## 2024-05-11 NOTE — Progress Notes (Signed)
 Symptom Management Clinic [facility] at [facility] [phone] [phone]  Patient Care Team: Center, [facility] as PCP - General (General Practice) End, Lonni, MD as PCP - Cardiology (Cardiology) Tamea Dedra CROME, MD as Consulting Physician (Pulmonary Disease) Verdene Gills, RN as Oncology Nurse Navigator Babara Call, MD as Consulting Physician (Oncology)   NAME OF PATIENT: Caitlyn Herrera  [id number]  [birth year]   DATE OF VISIT: 05/11/24  REASON FOR CONSULT: [name] is a 63 y.o. female with multiple medical problems including COPD and at least stage Ib squamous cell carcinoma of the lung.  Patient declined surgical evaluation.  She is currently on treatment with XRT.  INTERVAL HISTORY: Patient was referred to [facility] by radiation oncology to evaluate for wheezing.  Patient saw Dr. Babara on 04/07/2024 at which time patient was started on steroid taper for possible COPD exacerbation.  Patient is still an active smoker.  Today, patient reports 3 weeks of wheezing/exertional dyspnea.  She reports occasional cough with clear/white phlegm.  Patient says that she has been using her Trelegy daily as prescribed.  She says that she missed a recent follow-up appointment with Dr. Tamea.  Denies any neurologic complaints. Denies recent fevers or illnesses. Denies any easy bleeding or bruising. Reports good appetite and denies weight loss. Denies chest pain.  Denies lower extremity edema.  Denies any nausea, vomiting, constipation, or diarrhea. Denies urinary complaints. Patient offers no further specific complaints today.   PAST MEDICAL HISTORY: Past Medical History:  Diagnosis Date   Abdominal aortic ectasia (HCC)    a. 11/2017 CTA chest/abd/pelvis: 2.5 cm abd ao ectasia -->rec f/u u/s in 5 yrs.   Anemia    Angina at rest [facility])    Aortic atherosclerosis (HCC)    Asthma    CAD (coronary artery disease)    a.  11/04/17 Cath: Native multivessel dzs-->CABG x 3 (LIMA->LAD, VG->OM2, VG->RPDA; b. 11/2017 MV: mid antlat/apical isch; c. 11/2017 Cath: LM 30d, LAD 35m, LCX 80p/m, OM1 90, OM2 80 (@ anastamosis of graft), RCA 80p, 167m, 90d, LIMA->LAD nl, VG->OM2 nl, VG->RPDA nl-->Med Rx.   Carotid arterial disease (HCC)    a. 10/2017 Carotid U/S: 1-30% bilat ICA stenosis.   CHF (congestive heart failure) (HCC)    COPD (chronic obstructive pulmonary disease) (HCC)    DDD (degenerative disc disease), cervical    Dyspnea    GERD (gastroesophageal reflux disease)    History of echocardiogram    a. 11/04/2017 Echo: EF of 65-70%, no RWMA, nl LV diastolic fxn, nl RV size/fxn, mild TR; b. 03/2018 Echo: EF 60-65%, mild LVH, no rwma, mild MR. Mildy reduced RV fxn. Mild TR.   Hyperlipidemia    Hypertension    Left renal artery stenosis (HCC)    a. 11/2017 CTA Chest/Abd/Pelvis: 50-70% L RA stenosis.   Long-term use of aspirin  therapy    Noncompliance with medication treatment due to intermittent use of medication    NSTEMI (non-ST elevated myocardial infarction) (HCC) 10/2017   Palpitations    a. 02/2018 Event Monitor: wore for 2 days, 21 hrs - rare PAC's/PVC's. Avg HR 66 (42-103); b. 04/2018 Event Monitor: Wore for 14 days. RSR, 67 (49-111). No significant arrhythmias.   Persistent cough    Pulmonary HTN (HCC)    Pulmonary nodule, right    a. 10/2017 CT Chest: 5mm pulm nodule in R lung apex - rec f/u w/ non-contrast chest CT in 1 year.   S/P CABG x 3 11/12/2017  a.) LIMA-LAD, SVG-OM2, SVG-RPDA   Sepsis (HCC)    Streptococcal pneumonia (HCC) 12/19/2023   Thrombocytosis    Tobacco abuse    Type II diabetes mellitus (HCC)     PAST SURGICAL HISTORY:  Past Surgical History:  Procedure Laterality Date   BREAST BIOPSY Right 06/23/2018   US  guided biopsy - pathology benign   BREAST CYST EXCISION Right    CORONARY ARTERY BYPASS GRAFT N/A 11/12/2017   Procedure: CORONARY ARTERY BYPASS GRAFTING (CABG) x Three , using left  internal mammary artery and right leg greater saphenous vein harvested endoscopically;  Surgeon: Fleeta Hanford Coy, MD;  Location: [facility] OR;  Service: Open Heart Surgery;  Laterality: N/A;   DILATION AND CURETTAGE OF UTERUS     FLEXIBLE BRONCHOSCOPY Left 03/29/2024   Procedure: BRONCHOSCOPY, FLEXIBLE;  Surgeon: Tamea Dedra CROME, MD;  Location: [facility] ORS;  Service: Pulmonary;  Laterality: Left;   LAPAROSCOPIC CHOLECYSTECTOMY     LEFT HEART CATH AND CORONARY ANGIOGRAPHY N/A 11/04/2017   Procedure: LEFT HEART CATH AND CORONARY ANGIOGRAPHY;  Surgeon: Mady Bruckner, MD;  Location: [facility] INVASIVE CV LAB;  Service: Cardiovascular;  Laterality: N/A;   LEFT HEART CATH AND CORS/GRAFTS ANGIOGRAPHY N/A 12/09/2017   Procedure: LEFT HEART CATH AND CORS/GRAFTS ANGIOGRAPHY;  Surgeon: [name], Peter M, MD;  Location: [facility] INVASIVE CV LAB;  Service: Cardiovascular;  Laterality: N/A;   RIGHT/LEFT HEART CATH AND CORONARY ANGIOGRAPHY Bilateral 10/08/2022   Procedure: RIGHT/LEFT HEART CATH AND CORONARY ANGIOGRAPHY;  Surgeon: Mady Bruckner, MD;  Location: [facility] INVASIVE CV LAB;  Service: Cardiovascular;  Laterality: Bilateral;   TEE WITHOUT CARDIOVERSION N/A 11/12/2017   Procedure: TRANSESOPHAGEAL ECHOCARDIOGRAM (TEE);  Surgeon: Fleeta Hanford, Coy, MD;  Location: [facility] OR;  Service: Open Heart Surgery;  Laterality: N/A;   VIDEO BRONCHOSCOPY WITH ENDOBRONCHIAL ULTRASOUND Left 03/29/2024   Procedure: BRONCHOSCOPY, WITH EBUS;  Surgeon: Tamea Dedra CROME, MD;  Location: [facility] ORS;  Service: Pulmonary;  Laterality: Left;    HEMATOLOGY/ONCOLOGY HISTORY:  Oncology History  Primary lung squamous cell carcinoma, left (HCC)  02/27/2024 Imaging   CT chest without contrast showed 1. Persistent but improved left lower lobe opacity with increasing volume loss, likely improving pneumonia. However, the left lower lobe bronchus is occluded with rounded mass effect on the bronchus by soft tissue density. Poorly marginated soft tissue density in  the left infrahilar region in the region of bronchial occlusion. Findings are suspicious for centrally obstructing lesion. Hilar assessment is limited in the absence of IV contrast. Recommend further assessment with bronchoscopy. 2. The left upper lobe nodules on prior exam have resolved.   04/06/2024 Imaging   PET scan showed 1. Large area of hypermetabolic activity involving the left lower lobe bronchus consistent with known primary bronchogenic carcinoma. Persistent left lower lobe collapse with volume loss. 2. No evidence of metastatic disease. 3. Infrarenal abdominal aortic aneurysm measuring up to 3.4 cm. Recommend surveillance ultrasound in 3 years. Reference: Journal of Vascular Surgery 67.1 (2018): 2-77. J Am Coll Radiol [id number]. 4.  Aortic Atherosclerosis    04/06/2024 Imaging   MRI brain with and without contrast showed moderate nonspecific cerebral white matter disease.  No evidence of metastatic disease.   04/07/2024 Initial Diagnosis   Primary lung squamous cell carcinoma, left   February 2025, patient was diagnosed with community acquired pneumonia.  Treated with antibiotics.  CT of the chest showed left lower lobe consolidation and a left endobronchial lesion.  03/11/2024, repeat CT chest without contrast showed persistent but improved left lower lobe opacity with  increasing volume loss.  Left lower lobe bronchus is occluded with rounded mass effect on the bronchus by soft tissue density.  03/29/2024 patient's status post bronchoscopy biopsy. Lung, biopsy, left mainstem - SQUAMOUS CELL CARCINOMA, KERATINIZING, INVASIVE.  1. Fine Needle Aspiration Lymph Node 7, station 7 NEGATIVE FOR MALIGNANCY 2. Bronchial Brushing, left main stem POSITIVE FOR MALIGNANT CELLS, CONSISTENT WITH SQUAMOUS CELL CARCINOMA. 3. Bronchial Lavage, left POSITIVE FOR MALIGNANT CELLS, CONSISTENT WITH SQUAMOUS CELL CARCINOMA    04/07/2024 Cancer Staging   Staging form: Lung, AJCC V9 -  Clinical stage from 04/07/2024: Stage IB (cT2a, cN0, cM0) - Signed by Babara Call, MD on 04/07/2024 Stage prefix: Initial diagnosis Method of lymph node assessment: Clinical     ALLERGIES:  has no known allergies.  MEDICATIONS:  Current Outpatient Medications  Medication Sig Dispense Refill   albuterol  (VENTOLIN  HFA) 108 (90 Base) MCG/ACT inhaler Inhale 2 puffs into the lungs every 6 (six) hours as needed for wheezing or shortness of breath. (Patient taking differently: Inhale 2 puffs into the lungs every 6 (six) hours as needed for wheezing or shortness of breath. 200 is now the doeage) 8 g 2   aspirin  EC 81 MG tablet Take 1 tablet (81 mg total) by mouth daily. 90 tablet 3   atorvastatin  (LIPITOR ) 80 MG tablet TAKE 1 TABLET (80 MG TOTAL) BY MOUTH DAILY. (Patient taking differently: Take 80 mg by mouth every morning.) 30 tablet 10   azelastine (ASTELIN) 0.1 % nasal spray Place 2 sprays into both nostrils as needed.     azithromycin  (ZITHROMAX  Z-PAK) 250 MG tablet Take 2 pills on day 1 and then 1 pill per day for 4 days. 6 each 0   benzonatate  (TESSALON ) 100 MG capsule Take 1 capsule (100 mg total) by mouth 2 (two) times daily as needed for cough. 20 capsule 2   carvedilol  (COREG ) 12.5 MG tablet TAKE 1 TABLET BY MOUTH TWICE DAILY *PATIENT NEEDS APPOINTMENT* 60 tablet 11   cetirizine (ZYRTEC) 10 MG tablet Take 10 mg by mouth every morning.     citalopram  (CELEXA ) 40 MG tablet Take 40 mg by mouth every morning.     cyclobenzaprine (FLEXERIL) 10 MG tablet Take 10 mg by mouth 3 (three) times daily as needed.     ezetimibe  (ZETIA ) 10 MG tablet TAKE 1 TABLET(10 MG) BY MOUTH DAILY (Patient taking differently: Take 10 mg by mouth every morning. TAKE 1 TABLET(10 MG) BY MOUTH DAILY) 30 tablet 10   fluticasone  (FLONASE) 50 MCG/ACT nasal spray 2 sprays as needed.     Fluticasone -Umeclidin-Vilant (TRELEGY ELLIPTA ) 200-62.5-25 MCG/ACT AEPB Inhale 1 puff into the lungs daily. (Patient taking differently: Inhale 1  puff into the lungs every morning.) 60 each 11   gabapentin  (NEURONTIN ) 300 MG capsule Take 300 mg by mouth every morning. Has not started.     isosorbide  mononitrate (IMDUR ) 30 MG 24 hr tablet Take 0.5 tablets (15 mg total) by mouth daily. (Patient taking differently: Take 15 mg by mouth every morning.) 45 tablet 3   losartan  (COZAAR ) 25 MG tablet Take 0.5 tablets (12.5 mg total) by mouth daily. (Patient taking differently: Take 12.5 mg by mouth every morning.) 45 tablet 3   meloxicam (MOBIC) 15 MG tablet Take 15 mg by mouth daily as needed.     nitroGLYCERIN  (NITROSTAT ) 0.4 MG SL tablet DISSOLVE 1 TABLET UNDER THE TONGUE AS NEEDED FOR CHEST PAIN EVERY 5 MINUTES UP TO 3 TIMES. IF NO RELIEF CALL 911. 25 tablet 1  omeprazole  (PRILOSEC) 40 MG capsule TAKE 1 CAPSULE BY MOUTH EVERY DAY BEFORE BREAKFAST (Patient taking differently: 40 mg every morning.) 30 capsule 1   ondansetron  (ZOFRAN ) 4 MG tablet Take 1 tablet (4 mg total) by mouth every 8 (eight) hours as needed for nausea or vomiting. 60 tablet 0   predniSONE  (STERAPRED UNI-PAK 21 TAB) 10 MG (21) TBPK tablet Day 1 take 6 tablets, Day 2 take 5 tablets, Day 3 take 4 tablets, Day 4 take 3 tablets, Day 5 take 2 tablets D6 take 1 tablet 1 each 0   SitaGLIPtin-MetFORMIN  HCl (JANUMET XR) 50-1000 MG TB24 Take 1 tablet by mouth every morning.     tiZANidine (ZANAFLEX) 2 MG tablet Take 2 mg by mouth 3 (three) times daily.     traMADol  (ULTRAM ) 50 MG tablet Take 1 tablet (50 mg total) by mouth every 6 (six) hours as needed. 60 tablet 0   No current facility-administered medications for this visit.    VITAL SIGNS: There were no vitals taken for this visit. There were no vitals filed for this visit.  Estimated body mass index is 24.43 kg/m as calculated from the following:   Height as of 04/07/24: 5' 7 (1.702 m).   Weight as of 04/07/24: 156 lb (70.8 kg).  LABS: CBC:    Component Value Date/Time   WBC 7.5 05/06/2024 1408   WBC 11.1 (H) 04/07/2024  1144   HGB 11.1 (L) 05/06/2024 1408   HGB 11.0 (L) 02/04/2024 1508   HCT 35.1 (L) 05/06/2024 1408   HCT 35.1 02/04/2024 1508   PLT 587 (H) 05/06/2024 1408   PLT 501 (H) 02/04/2024 1508   MCV 84.0 05/06/2024 1408   MCV 89 02/04/2024 1508   NEUTROABS 6.3 04/07/2024 1144   NEUTROABS 3.2 03/26/2018 1042   LYMPHSABS 3.3 04/07/2024 1144   LYMPHSABS 3.7 (H) 03/26/2018 1042   MONOABS 1.3 (H) 04/07/2024 1144   EOSABS 0.1 04/07/2024 1144   EOSABS 0.2 03/26/2018 1042   BASOSABS 0.1 04/07/2024 1144   BASOSABS 0.0 03/26/2018 1042   Comprehensive Metabolic Panel:    Component Value Date/Time   NA 135 04/07/2024 1144   NA 134 07/24/2020 1358   K 4.4 04/07/2024 1144   CL 102 04/07/2024 1144   CO2 25 04/07/2024 1144   BUN 11 04/07/2024 1144   BUN 17 07/24/2020 1358   CREATININE 0.88 04/07/2024 1144   GLUCOSE 108 (H) 04/07/2024 1144   CALCIUM  9.4 04/07/2024 1144   AST 14 (L) 04/07/2024 1144   ALT 10 04/07/2024 1144   ALKPHOS 91 04/07/2024 1144   BILITOT 0.9 04/07/2024 1144   BILITOT 0.2 03/20/2021 1556   PROT 8.1 04/07/2024 1144   PROT 6.7 03/20/2021 1556   ALBUMIN  3.9 04/07/2024 1144   ALBUMIN  4.1 03/20/2021 1556    RADIOGRAPHIC STUDIES: No results found.  PERFORMANCE STATUS (ECOG) : 1 - Symptomatic but completely ambulatory  Review of Systems Unless otherwise noted, a complete review of systems is negative.  Physical Exam General: NAD Cardiovascular: regular rate and rhythm Pulmonary: coarse BSs ant/post fields Abdomen: soft, nontender, + bowel sounds GU: no suprapubic tenderness Extremities: no edema, no joint deformities Skin: no rashes Neurological: Weakness but otherwise nonfocal  IMPRESSION/PLAN: Lung cancer - on XRT  Wheezing -patient has history of COPD with asthma and is on Trelegy.  Followed by pulmonology.  Patient completed steroid taper 1 month ago, which patient says helped temporarily with her symptoms.  Shortness of breath and wheezing have worsened over  the past several weeks.  Patient has no fever.  No lower extremity edema or signs of heart failure.  Suspect that this is related to COPD and possibly effects from radiation.  Will obtain stat chest x-ray for further evaluation. Restart steroid taper.   Case and plan discussed with Dr. Babara.    Patient expressed understanding and was in agreement with this plan. She also understands that She can call clinic at any time with any questions, concerns, or complaints.   Thank you for allowing me to participate in the care of this very pleasant patient.   Time Total: 20 minutes  Visit consisted of counseling and education dealing with the complex and emotionally intense issues of symptom management in the setting of serious illness.Greater than 50%  of this time was spent counseling and coordinating care related to the above assessment and plan.  Signed by: Fonda Mower, PhD, NP-C

## 2024-05-12 ENCOUNTER — Other Ambulatory Visit: Payer: Self-pay

## 2024-05-12 ENCOUNTER — Ambulatory Visit
Admission: RE | Admit: 2024-05-12 | Discharge: 2024-05-12 | Disposition: A | Discharge status: Home / Self Care | Source: Ambulatory Visit | Attending: Radiation Oncology | Admitting: Radiation Oncology

## 2024-05-12 DIAGNOSIS — Z510 Encounter for antineoplastic radiation therapy: Secondary | ICD-10-CM | POA: Diagnosis not present

## 2024-05-12 LAB — RAD ONC ARIA SESSION SUMMARY
Course Elapsed Days: 20
Plan Fractions Treated to Date: 13
Plan Prescribed Dose Per Fraction: 2 Gy
Plan Total Fractions Prescribed: 33
Plan Total Prescribed Dose: 66 Gy
Reference Point Dosage Given to Date: 26 Gy
Reference Point Session Dosage Given: 2 Gy
Session Number: 13

## 2024-05-13 ENCOUNTER — Ambulatory Visit
Admission: RE | Admit: 2024-05-13 | Discharge: 2024-05-13 | Disposition: A | Discharge status: Home / Self Care | Source: Ambulatory Visit | Attending: Radiation Oncology | Admitting: Radiation Oncology

## 2024-05-13 ENCOUNTER — Encounter: Payer: Self-pay | Admitting: Emergency Medicine

## 2024-05-13 ENCOUNTER — Telehealth: Payer: Self-pay

## 2024-05-13 ENCOUNTER — Other Ambulatory Visit: Payer: Self-pay

## 2024-05-13 ENCOUNTER — Encounter: Payer: Self-pay | Admitting: Pulmonary Disease

## 2024-05-13 ENCOUNTER — Ambulatory Visit (INDEPENDENT_AMBULATORY_CARE_PROVIDER_SITE_OTHER): Admitting: Pulmonary Disease

## 2024-05-13 ENCOUNTER — Emergency Department

## 2024-05-13 ENCOUNTER — Emergency Department
Admission: EM | Admit: 2024-05-13 | Discharge: 2024-05-13 | Disposition: A | Discharge status: Home / Self Care | Attending: Emergency Medicine | Admitting: Emergency Medicine

## 2024-05-13 ENCOUNTER — Inpatient Hospital Stay

## 2024-05-13 VITALS — BP 120/70 | HR 85 | Temp 98.9°F | Ht 66.0 in | Wt 153.6 lb

## 2024-05-13 DIAGNOSIS — R220 Localized swelling, mass and lump, head: Secondary | ICD-10-CM | POA: Diagnosis present

## 2024-05-13 DIAGNOSIS — Z85828 Personal history of other malignant neoplasm of skin: Secondary | ICD-10-CM | POA: Insufficient documentation

## 2024-05-13 DIAGNOSIS — J9809 Other diseases of bronchus, not elsewhere classified: Secondary | ICD-10-CM | POA: Diagnosis not present

## 2024-05-13 DIAGNOSIS — K1120 Sialoadenitis, unspecified: Secondary | ICD-10-CM | POA: Diagnosis not present

## 2024-05-13 DIAGNOSIS — C3492 Malignant neoplasm of unspecified part of left bronchus or lung: Secondary | ICD-10-CM | POA: Diagnosis not present

## 2024-05-13 DIAGNOSIS — J441 Chronic obstructive pulmonary disease with (acute) exacerbation: Secondary | ICD-10-CM | POA: Insufficient documentation

## 2024-05-13 DIAGNOSIS — R221 Localized swelling, mass and lump, neck: Secondary | ICD-10-CM

## 2024-05-13 DIAGNOSIS — Z510 Encounter for antineoplastic radiation therapy: Secondary | ICD-10-CM | POA: Diagnosis not present

## 2024-05-13 HISTORY — DX: Malignant (primary) neoplasm, unspecified: C80.1

## 2024-05-13 LAB — CBC
HCT: 33.9 % — ABNORMAL LOW (ref 36.0–46.0)
Hemoglobin: 10.8 g/dL — ABNORMAL LOW (ref 12.0–15.0)
MCH: 26.9 pg (ref 26.0–34.0)
MCHC: 31.9 g/dL (ref 30.0–36.0)
MCV: 84.3 fL (ref 80.0–100.0)
Platelets: 432 K/uL — ABNORMAL HIGH (ref 150–400)
RBC: 4.02 MIL/uL (ref 3.87–5.11)
RDW: 15.8 % — ABNORMAL HIGH (ref 11.5–15.5)
WBC: 7.7 K/uL (ref 4.0–10.5)
nRBC: 0 % (ref 0.0–0.2)

## 2024-05-13 LAB — RAD ONC ARIA SESSION SUMMARY
Course Elapsed Days: 21
Plan Fractions Treated to Date: 14
Plan Prescribed Dose Per Fraction: 2 Gy
Plan Total Fractions Prescribed: 33
Plan Total Prescribed Dose: 66 Gy
Reference Point Dosage Given to Date: 28 Gy
Reference Point Session Dosage Given: 2 Gy
Session Number: 14

## 2024-05-13 LAB — BASIC METABOLIC PANEL WITH GFR
Anion gap: 10 (ref 5–15)
BUN: 14 mg/dL (ref 8–23)
CO2: 24 mmol/L (ref 22–32)
Calcium: 9.6 mg/dL (ref 8.9–10.3)
Chloride: 103 mmol/L (ref 98–111)
Creatinine, Ser: 0.86 mg/dL (ref 0.44–1.00)
GFR, Estimated: >60 mL/min (ref >60)
Glucose, Bld: 94 mg/dL (ref 70–99)
Potassium: 4.4 mmol/L (ref 3.5–5.1)
Sodium: 137 mmol/L (ref 135–145)

## 2024-05-13 LAB — CBC (CANCER CENTER ONLY)
HCT: 36 % (ref 36.0–46.0)
Hemoglobin: 11.2 g/dL — ABNORMAL LOW (ref 12.0–15.0)
MCH: 26.4 pg (ref 26.0–34.0)
MCHC: 31.1 g/dL (ref 30.0–36.0)
MCV: 84.7 fL (ref 80.0–100.0)
Platelet Count: 443 K/uL — ABNORMAL HIGH (ref 150–400)
RBC: 4.25 MIL/uL (ref 3.87–5.11)
RDW: 15.9 % — ABNORMAL HIGH (ref 11.5–15.5)
WBC Count: 7.6 K/uL (ref 4.0–10.5)
nRBC: 0 % (ref 0.0–0.2)

## 2024-05-13 LAB — TROPONIN I (HIGH SENSITIVITY)
Troponin I (High Sensitivity): 7 ng/L (ref <18)
Troponin I (High Sensitivity): 8 ng/L (ref <18)

## 2024-05-13 MED ORDER — FAMOTIDINE IN NACL 20-0.9 MG/50ML-% IV SOLN
20.0000 mg | Freq: Once | INTRAVENOUS | Status: AC
  Administered 2024-05-13: 20 mg via INTRAVENOUS
  Filled 2024-05-13: qty 50

## 2024-05-13 MED ORDER — DOXYCYCLINE HYCLATE 100 MG PO TABS: 100.0000 mg | ORAL_TABLET | Freq: Two times a day (BID) | ORAL | 0 refills | Status: AC

## 2024-05-13 MED ORDER — METHYLPREDNISOLONE SODIUM SUCC 125 MG IJ SOLR
125.0000 mg | Freq: Once | INTRAMUSCULAR | Status: AC
  Administered 2024-05-13: 125 mg via INTRAVENOUS
  Filled 2024-05-13: qty 2

## 2024-05-13 MED ORDER — IOHEXOL 350 MG/ML SOLN
75.0000 mL | Freq: Once | INTRAVENOUS | Status: AC | PRN
  Administered 2024-05-13: 75 mL via INTRAVENOUS

## 2024-05-13 MED ORDER — FENTANYL CITRATE PF 50 MCG/ML IJ SOSY
50.0000 ug | PREFILLED_SYRINGE | Freq: Once | INTRAMUSCULAR | Status: AC
  Administered 2024-05-13: 50 ug via INTRAVENOUS
  Filled 2024-05-13: qty 1

## 2024-05-13 MED ORDER — IPRATROPIUM-ALBUTEROL 0.5-2.5 (3) MG/3ML IN SOLN
3.0000 mL | Freq: Once | RESPIRATORY_TRACT | Status: AC
  Administered 2024-05-13: 3 mL via RESPIRATORY_TRACT
  Filled 2024-05-13: qty 3

## 2024-05-13 MED ORDER — ONDANSETRON HCL 4 MG/2ML IJ SOLN
4.0000 mg | Freq: Once | INTRAMUSCULAR | Status: AC
  Administered 2024-05-13: 4 mg via INTRAVENOUS
  Filled 2024-05-13: qty 2

## 2024-05-13 MED ORDER — AMOXICILLIN-POT CLAVULANATE 875-125 MG PO TABS: 1.0000 | ORAL_TABLET | Freq: Two times a day (BID) | ORAL | 0 refills | Status: AC

## 2024-05-13 MED ORDER — IPRATROPIUM-ALBUTEROL 0.5-2.5 (3) MG/3ML IN SOLN
3.0000 mL | Freq: Once | RESPIRATORY_TRACT | Status: AC
Start: 2024-05-13 — End: 2024-05-13
  Administered 2024-05-13: 3 mL via RESPIRATORY_TRACT

## 2024-05-13 NOTE — Patient Instructions (Signed)
 VISIT SUMMARY:  Today, you were seen for wheezing, shortness of breath, and swelling in your neck and face. You have a history of COPD and non-small cell lung cancer, and you are currently undergoing radiation therapy.  YOUR PLAN:  -NON-SMALL CELL LUNG CANCER: Non-small cell lung cancer is a type of lung cancer that affects the cells of the lung. You are currently undergoing radiation therapy, which may be causing swelling in the treated area and contributing to your respiratory symptoms.  -CHRONIC OBSTRUCTIVE PULMONARY DISEASE (COPD): COPD is a chronic lung disease that makes it hard to breathe. You are experiencing wheezing and bronchospasm, likely worsened by your radiation therapy. You restarted Prednisone  yesterday, and a DuoNeb treatment today provided some relief. Continue using DuoNeb treatments to help with bronchospasm.  -CERVICAL ABSCESS: A cervical abscess is a collection of pus in the neck area, which can cause significant swelling and discomfort. The swelling on the left side of your neck is highly suspicious for an abscess. You need to go to the emergency room for further evaluation and management, which may include IV antibiotics.  INSTRUCTIONS:  Please go to the emergency room immediately for further evaluation and management of the swelling in your neck, which may require IV antibiotics.

## 2024-05-13 NOTE — ED Provider Notes (Signed)
 [facility] Provider Note    Event Date/Time   First MD Initiated Contact with Patient 05/13/24 1704     (approximate)   History   Facial Swelling   HPI  Caitlyn Herrera is a 63 y.o. female with COPD, swelling of the neck with history of squamous cell cancer of the lung who comes in with concerns for facial swelling.  I reviewed the note from pulmonary from today where patient was seen for wheezing.  Start on prednisone  yesterday most likely brought upon by radiation therapy for lung cancer.  She also has swelling of the left side of the neck they were concerned about abscess so they wanted a CT of the neck and chest and potentially IV antibiotics suicide here for further evaluation  Patient reports having the shortness of breath starting today.  She also reports that she noticed the swelling in her throat before the radiation treatment.  She denies having this previously.  She still able to swallow and talk.  She denies any allergic reactions previously.  She is on losartan .      Physical Exam   Triage Vital Signs: ED Triage Vitals  Encounter Vitals Group     BP 05/13/24 1620 (!) 148/78     Girls Systolic BP Percentile --      Girls Diastolic BP Percentile --      Boys Systolic BP Percentile --      Boys Diastolic BP Percentile --      Pulse Rate 05/13/24 1620 88     Resp 05/13/24 1620 17     Temp 05/13/24 1620 98.9 F (37.2 C)     Temp Source 05/13/24 1620 Oral     SpO2 05/13/24 1620 99 %     Weight 05/13/24 1621 153 lb (69.4 kg)     Height 05/13/24 1621 5' 6 (1.676 m)     Head Circumference --      Peak Flow --      Pain Score 05/13/24 1621 7     Pain Loc --      Pain Education --      Exclude from Growth Chart --     Most recent vital signs: Vitals:   05/13/24 1620  BP: (!) 148/78  Pulse: 88  Resp: 17  Temp: 98.9 F (37.2 C)  SpO2: 99%     General: Awake, no distress.  CV:  Good peripheral perfusion.  Resp:  Slight  increased work of breathing wheezing noted bilaterally Abd:  No distention.  Other:  Dentures removed from the bottom of her mouth without any signs of swelling or induration noted in her mouth.  She does have about a baseball sized swelling noted on the left side of her neck without any redness or erythema noted to the area.   ED Results / Procedures / Treatments   Labs (all labs ordered are listed, but only abnormal results are displayed) Labs Reviewed  CBC - Abnormal; Notable for the following components:      Result Value   Hemoglobin 10.8 (*)    HCT 33.9 (*)    RDW 15.8 (*)    Platelets 432 (*)    All other components within normal limits  BASIC METABOLIC PANEL WITH GFR  TROPONIN I (HIGH SENSITIVITY)     EKG  My interpretation of EKG:  Normal sinus rate of 77 without any ST elevation or T wave inversions except for V2, normal intervals  RADIOLOGY I have reviewed  the xray personally and interpreted patient has bronchogenic carcinoma that is known   PROCEDURES:  Critical Care performed: No  Procedures   MEDICATIONS ORDERED IN ED: Medications  methylPREDNISolone  sodium succinate (SOLU-MEDROL ) 125 mg/2 mL injection 125 mg (125 mg Intravenous Given 05/13/24 1730)  ipratropium-albuterol  (DUONEB) 0.5-2.5 (3) MG/3ML nebulizer solution 3 mL (3 mLs Nebulization Given 05/13/24 1733)  ipratropium-albuterol  (DUONEB) 0.5-2.5 (3) MG/3ML nebulizer solution 3 mL (3 mLs Nebulization Given 05/13/24 1732)  famotidine  (PEPCID ) IVPB 20 mg premix (0 mg Intravenous Stopped 05/13/24 1807)  fentaNYL  (SUBLIMAZE ) injection 50 mcg (50 mcg Intravenous Given 05/13/24 1825)  ondansetron  (ZOFRAN ) injection 4 mg (4 mg Intravenous Given 05/13/24 1825)  iohexol  (OMNIPAQUE ) 350 MG/ML injection 75 mL (75 mLs Intravenous Contrast Given 05/13/24 1805)     IMPRESSION / MDM / ASSESSMENT AND PLAN / ED COURSE  I reviewed the triage vital signs and the nursing notes.   Patient's presentation is most  consistent with acute presentation with potential threat to life or bodily function.   Patient comes in with shortness of breath that seems consistent with asthma COPD flare.  Patient treated with DuoNebs, steroids.  Not significantly working to breathe we will see how patient does after DuoNeb's.  She also does have some swelling noted in the left side of her neck.  Denies any history of allergic reactions that seems more consistent with acute abscess or lymph node.  Seems less likely angioedema but patient is on losartan  so if CT imaging is negative may need to consider this.  CBC shows stable hemoglobin with normal white count Troponin is negative BMP reassuring  7:09 PM Patient looks much better on examination she is resting comfortably on her side without any significant shortness of breath.  Her swelling of her neck is not gotten worse in size.  Discussed with her that we are still pending the CT scan to be read by radiology and final disposition she expressed understanding  9:26 PM she was amatory without any significant shortness of breath offered admission to the hospital for her COPD but patient declined stating that she felt comfortable with discharge home.  IMPRESSION: 1. Left submandibular sialadenitis. No convincing evidence of obstructing mass or calculus, although the submandibular duct is dilated. A stricture or small obstructing lesion cannot be definitively excluded.  On examination the swelling has actually come down.  We discussed the finding and need for follow-up with her oncologist, ENT to discuss this but I suspect that given the sudden onset that it is probably more likely a small stone I was not able to palpate anything on examination.  We discussed lozengers.  Will cover with antibiotics  IMPRESSION: 1. No evidence of pulmonary embolism. 2. Persistent complete left lower lobe atelectasis secondary to ill-defined obstructing left hilar mass resulting in  complete effacement of left lower lobe airways. 3. Interval increased tree-in-bud nodularity of the lingula, likely postobstructive infection/inflammation. 4. Partially imaged left neck edema, better evaluated on same day CT neck. 5. Aortic Atherosclerosis (ICD10-I70.0) and Emphysema (ICD10-J43.9). Coronary artery calcifications. Assessment for potential risk factor modification, dietary therapy or pharmacologic therapy may be warranted, if clinically indicated.    Given the COPD exacerbation will cover with Augmentin , Doxy for possible infection and this will also cover her neck.  Patient is not septic she is not septic she is not hypoxic and she would prefer outpatient treatment.  The patient is on the cardiac monitor to evaluate for evidence of arrhythmia and/or significant heart rate changes.  FINAL CLINICAL IMPRESSION(S) / ED DIAGNOSES   Final diagnoses:  Sialadenitis  COPD with acute exacerbation (HCC)     Rx / DC Orders   ED Discharge Orders          Ordered    doxycycline  (VIBRA -TABS) 100 MG tablet  2 times daily        05/13/24 2129    amoxicillin -clavulanate (AUGMENTIN ) 875-125 MG tablet  2 times daily        05/13/24 2129             Note:  This document was prepared using Dragon voice recognition software and may include unintentional dictation errors.   Ernest Ronal BRAVO, MD 05/13/24 2130

## 2024-05-13 NOTE — ED Notes (Signed)
 Attempted x2 for IV with no success. After attempting IV access pt now c/o CP.

## 2024-05-13 NOTE — ED Notes (Signed)
 Introduced myself to patient. Patient is resting in bed. Patient denies any pain at this time. Warm blankets and pillow provided.

## 2024-05-13 NOTE — ED Notes (Signed)
 This RN called lab to collect repeat trop

## 2024-05-13 NOTE — ED Provider Triage Note (Addendum)
 Emergency Medicine Provider Triage Evaluation Note  Caitlyn Herrera , a 63 y.o. female  was evaluated in triage.  Pt complains of left neck pain, difficulty breathing, left chest pain.  Patient was referred by pulmonology, she has history of lung cancer.  Denies fever.  Patient has history of COPD.  Review of Systems  Positive: Sore throat a week ago treated with steroids Negative:  Physical Exam  BP (!) 148/78 (BP Location: Right Arm)   Pulse 88   Temp 98.9 F (37.2 C) (Oral)   Resp 17   Ht 5' 6 (1.676 m)   Wt 69.4 kg   SpO2 99%   BMI 24.69 kg/m patient was hypertensive during triage Gen:   Awake, no distress no stridor, no drooling Resp:  Normal effort, bilateral crackles, wheezing MSK:   Moves extremities without difficulty  Other:    Medical Decision Making  Medically screening exam initiated at 4:41 PM.  Appropriate orders placed.  TYECHIA ALLMENDINGER was informed that the remainder of the evaluation will be completed by another provider, this initial triage assessment does not replace that evaluation, and the importance of remaining in the ED until their evaluation is complete.  Patient with history of cancer, who presents today with difficulty breathing, edema in the left side of the neck, no stridor, bilateral crackles.  I ordered CBC CMP EKG   Janit Kast, PA-C 05/13/24 1643    Janit Kast, PA-C 05/13/24 1811

## 2024-05-13 NOTE — ED Triage Notes (Signed)
 Patient to ED via POV for left sided facial/neck swelling. Started today. Had radiation done today for Stage 1 lung cancer. Sent over from pulmonology for same. Was given a breathing treatment there for wheezing. Pt reports wheezing x3 weeks.

## 2024-05-13 NOTE — Progress Notes (Signed)
 Subjective:    Patient ID: Caitlyn Herrera, female    DOB: [birth year], 63 y.o.   MRN: [id number]  Patient Care Team: [facility] as PCP - General (General Practice) End, Lonni, MD as PCP - Cardiology (Cardiology) Tamea Dedra CROME, MD as Consulting Physician (Pulmonary Disease) Verdene Gills, RN as Oncology Nurse Navigator Babara Call, MD as Consulting Physician (Oncology)  Chief Complaint  Patient presents with   Follow-up    COPD, Bronch follow up  Has been wheezing for about 3 weeks now, throat has been swelling with left sided ear and face pain, started today -- possibly a side effect from the radiation appointment -- had one today. Currently using inhalers which are somewhat helpful. Started a new round of steroids today as well.    BACKGROUND/INTERVAL:Patient is a 63 year old current smoker last seen on 18 Mar 2023 following for the issue of COPD with asthma overlap.  The patient had previously been enrolled in lung cancer screening program but then dropped out of the program on her own.  Last CT chest was 19 December 2023 (CT angio chest) at that time showing infrahilar masslike fullness and narrowing of the left lower lobe bronchus concerning for malignancy. Subsequently was admitted to [facility] and admitted for streptococcal pneumonia at that time she was then seen by my colleague Dr. Malka who ordered a repeat CT after treatment for pneumonia.  She was seen in follow-up on 16 Mar 2024 with subsequent bronchoscopy on 29 March 2024 showing non-small cell carcinoma of the left lung.  Presents today due to persistent wheezing.  HPI Discussed the use of AI scribe software for clinical note transcription with the patient, who gave verbal consent to proceed.  History of Present Illness   Caitlyn Herrera is a 63 year old female with COPD and non-small cell lung cancer who presents with bronchospasm and cervical swelling.  She has been experiencing  wheezing for an unspecified duration, causing fatigue and shortness of breath. She was previously given steroids and resumed them yesterday. She received a DuoNeb treatment in the clinic today, which helped alleviate her bronchospasm to some degree.  She noticed swelling in her neck and a sensation of developing a sore throat while eating today. Her left neck is swollen, and she also mentioned that her face is affected by the swelling.  On examination she is exquisitely tender and appears to be developing an abscess.     DATA 01/09/2021 LDCT: Small scattered lung nodules largest in the right lung apex with an equivalent diameter of 3.6 mm.  No suspicious nodules identified.  Lung RADS 2, benign appearance or behavior.  Emphysema and aortic atherosclerosis. 06/10/2022 echocardiogram: LVEF 55 to 60%, normal LV function, normal diastolic parameters.  RV function normal.  RV size normal.  Trivial mitral valve regurgitation. 09/16/2022 chest x-ray PA and lateral: No active cardiopulmonary disease. 10/08/2022 right and left heart cath: Severe native three-vessel coronary disease similar to 2019.  Widely patent LIMA to LAD.  Patent SVG-OM 2 with 80% stenosis in OM 2 distal to the anastomosis.  Moderate ectasia of the distal portion of the SVG.  Noted SVG-rPDA new since 2019 but does not appear acute.  Normal left heart, right heart and pulmonary artery pressures.  Normal Fick cardiac output/index. 10/24/2022 PFTs: FEV1 1.44 L or 52% predicted, FVC 1.76 L or 48% predicted, FEV1/FVC 82%, there was significant bronchodilator response.  Lung volumes were moderately reduced.  Diffusion capacity moderately reduced but corrects to  normal by alveolar volume.  Consistent with obstructive airways disease with reversibility and moderate restriction either due to chest wall or interstitial changes.  Review of Systems A 10 point review of systems was performed and it is as noted above otherwise negative.   Patient Active  Problem List   Diagnosis Date Noted   Primary lung squamous cell carcinoma, left (HCC) 04/07/2024   COPD exacerbation (HCC) 04/07/2024   Decreased appetite 04/07/2024   Nausea without vomiting 04/07/2024   Neoplasm related pain 04/07/2024   Hypophosphatemia 12/20/2023   Diabetes mellitus without complication (HCC) 12/20/2023   CAP (community acquired pneumonia) 12/19/2023   Sepsis (HCC) 12/19/2023   CAD (coronary artery disease) 12/19/2023   Chronic diastolic CHF (congestive heart failure) (HCC) 12/19/2023   Hypokalemia 12/19/2023   Nausea vomiting and diarrhea 12/19/2023   Lung mass_mass like 12/19/2023   Pulmonary hypertension, unspecified (HCC) 10/08/2022   Fatigue 05/08/2022   Ischemic cardiomyopathy 11/08/2021   Coronary artery disease of native artery of native heart with stable angina pectoris (HCC) 02/22/2019   Chronic cough 05/28/2018   Shortness of breath 03/26/2018   Essential hypertension 03/26/2018   Palpitations 03/26/2018   Chest pressure 12/08/2017   Unstable angina (HCC) 12/08/2017   Angina at rest [facility])    Hyperlipidemia LDL goal <70    S/P CABG (coronary artery bypass graft) 11/12/2017   Non-insulin  dependent type 2 diabetes mellitus (HCC) 11/10/2017   3-vessel CAD 11/10/2017   HCVD (hypertensive cardiovascular disease) 11/10/2017   UTI (urinary tract infection) 11/10/2017   Pulmonary nodule, right 11/10/2017   Hyperlipidemia associated with type 2 diabetes mellitus (HCC) 11/07/2017   Tobacco abuse    NSTEMI (non-ST elevated myocardial infarction) (HCC) 11/05/2017    Social History   Tobacco Use   Smoking status: Former    Current packs/day: 0.00    Average packs/day: 0.5 packs/day for 45.5 years (22.8 ttl pk-yrs)    Types: Cigarettes    Start date: 26    Quit date: 04/29/2024    Years since quitting: 0.0   Smokeless tobacco: Never   Tobacco comments:    5 cigarettes a day- khj 01/19/2024. Smoking some days 4 cigarettes a day. 03/16/2024. sd   Substance Use Topics   Alcohol use: Not Currently    Comment: occassionally    No Known Allergies  Current Meds  Medication Sig   albuterol  (VENTOLIN  HFA) 108 (90 Base) MCG/ACT inhaler Inhale 2 puffs into the lungs every 6 (six) hours as needed for wheezing or shortness of breath.   aspirin  EC 81 MG tablet Take 1 tablet (81 mg total) by mouth daily.   atorvastatin  (LIPITOR ) 80 MG tablet TAKE 1 TABLET (80 MG TOTAL) BY MOUTH DAILY. (Patient taking differently: Take 80 mg by mouth every morning.)   azelastine (ASTELIN) 0.1 % nasal spray Place 2 sprays into both nostrils as needed.   benzonatate  (TESSALON ) 100 MG capsule Take 1 capsule (100 mg total) by mouth 2 (two) times daily as needed for cough.   carvedilol  (COREG ) 12.5 MG tablet TAKE 1 TABLET BY MOUTH TWICE DAILY *PATIENT NEEDS APPOINTMENT*   cetirizine (ZYRTEC) 10 MG tablet Take 10 mg by mouth every morning.   citalopram  (CELEXA ) 40 MG tablet Take 40 mg by mouth every morning.   cyclobenzaprine (FLEXERIL) 10 MG tablet Take 10 mg by mouth 3 (three) times daily as needed.   ezetimibe  (ZETIA ) 10 MG tablet TAKE 1 TABLET(10 MG) BY MOUTH DAILY (Patient taking differently: Take 10 mg by mouth every  morning. TAKE 1 TABLET(10 MG) BY MOUTH DAILY)   fluticasone  (FLONASE) 50 MCG/ACT nasal spray 2 sprays as needed.   Fluticasone -Umeclidin-Vilant (TRELEGY ELLIPTA ) 200-62.5-25 MCG/ACT AEPB Inhale 1 puff into the lungs daily.   gabapentin  (NEURONTIN ) 300 MG capsule Take 300 mg by mouth every morning. Has not started.   isosorbide  mononitrate (IMDUR ) 120 MG 24 hr tablet Take 120 mg by mouth every morning.   losartan  (COZAAR ) 25 MG tablet Take 0.5 tablets (12.5 mg total) by mouth daily.   meloxicam (MOBIC) 15 MG tablet Take 15 mg by mouth daily as needed.   methylPREDNISolone  (MEDROL  DOSEPAK) 4 MG TBPK tablet Take 6 tablets x 1 day, then take 5 tablets x 1 day, then take 4 tablets x 1 day, then take 3 tablets x 1 day, then 2 tablets x 1 day, then 1  tablet, then stop.   nitroGLYCERIN  (NITROSTAT ) 0.4 MG SL tablet DISSOLVE 1 TABLET UNDER THE TONGUE AS NEEDED FOR CHEST PAIN EVERY 5 MINUTES UP TO 3 TIMES. IF NO RELIEF CALL 911.   omeprazole  (PRILOSEC) 40 MG capsule TAKE 1 CAPSULE BY MOUTH EVERY DAY BEFORE BREAKFAST   ondansetron  (ZOFRAN ) 4 MG tablet Take 1 tablet (4 mg total) by mouth every 8 (eight) hours as needed for nausea or vomiting.   SitaGLIPtin-MetFORMIN  HCl (JANUMET XR) 50-1000 MG TB24 Take 1 tablet by mouth every morning.   tiZANidine (ZANAFLEX) 2 MG tablet Take 2 mg by mouth 3 (three) times daily.   traMADol  (ULTRAM ) 50 MG tablet Take 1 tablet (50 mg total) by mouth every 6 (six) hours as needed.    Immunization History  Administered Date(s) Administered   Fluad Trivalent(High Dose 65+) 12/21/2023   Influenza,inj,Quad PF,6+ Mos 08/05/2018, 08/06/2021   Influenza-Unspecified 09/30/2019, 12/11/2020, 08/28/2022   Moderna SARS-COV2 Booster Vaccination 12/11/2020   Moderna Sars-Covid-2 Vaccination 04/19/2020, 05/17/2020   Pneumococcal Polysaccharide-23 06/06/2020   Td (Adult),5 Lf Tetanus Toxid, Preservative Free 06/06/2020   Zoster Recombinant(Shingrix) 10/14/2023        Objective:     BP 120/70 (BP Location: Right Arm, Patient Position: Sitting, Cuff Size: Normal)   Pulse 85   Temp 98.9 F (37.2 C) (Oral)   Ht 5' 6 (1.676 m)   Wt 153 lb 9.6 oz (69.7 kg)   SpO2 98%   BMI 24.79 kg/m   SpO2: 98 %  GENERAL: Well-developed, well-nourished woman, no acute distress, fully ambulatory.  No conversational dyspnea HEAD: Normocephalic, atraumatic.  EYES: Pupils equal, round, reactive to light.  No scleral icterus.  MOUTH: Edentulous, wears dentures.  Oral mucosa moist.  No thrush. NECK: Supple. No thyromegaly. Trachea midline. No JVD.  There is significant swelling/adenopathy on the left submental/cervical area that is exquisitely tender to touch and appears to be a developing abscess. PULMONARY: Good air entry bilaterally.   Generalized wheezing noted particularly on the left lower lung field. CARDIOVASCULAR: S1 and S2. Regular rate and rhythm.  No rubs, murmurs or gallops heard. ABDOMEN: Obese otherwise benign. MUSCULOSKELETAL: No joint deformity, no clubbing, no edema.  NEUROLOGIC: No focal deficit, no gait disturbance, speech is fluent. SKIN: Intact,warm,dry. PSYCH: Mood and behavior normal.  Patient received DuoNeb x 1: This helped with relieving some of the patient's bronchospasm.      Assessment & Plan:     ICD-10-CM   1. COPD with acute exacerbation (HCC)  J44.1 ipratropium-albuterol  (DUONEB) 0.5-2.5 (3) MG/3ML nebulizer solution 3 mL    2. Localized swelling, mass or lump of neck - LEFT  R22.1  R/O Abscess Will need neck/chest CT    3. Squamous cell carcinoma of lung, left (HCC)  C34.92     4. Bronchial obstruction - LLL collapse  J98.09      Meds ordered this encounter  Medications   ipratropium-albuterol  (DUONEB) 0.5-2.5 (3) MG/3ML nebulizer solution 3 mL   Assessment and Plan    Non-small cell lung cancer Undergoing radiation therapy, which may be causing edema in the treated area (left lung) and contributing to respiratory symptoms.  Chronic obstructive pulmonary disease (COPD) with acute exacerbation Experiencing wheezing and bronchospasm, likely exacerbated by radiation therapy for lung cancer. Prednisone  was restarted yesterday. DuoNeb treatment provided partial relief of bronchospasm. - Administer DuoNeb treatment to alleviate bronchospasm. - Referred to ED, will need inpatient management of COPD exacerbation  Rule out cervical abscess Significant swelling on the left side of the neck, highly suspicious for developing abscess. Likely requires IV antibiotics and further evaluation. - Refer to emergency room for further evaluation and management, including potential IV antibiotics.  -Will need CT of neck and chest.     Advised if symptoms do not improve or worsen, to please  contact office for sooner follow up or seek emergency care.    I spent 32 minutes of dedicated to the care of this patient on the date of this encounter to include pre-visit review of records, face-to-face time with the patient discussing conditions above, post visit ordering of testing, clinical documentation with the electronic health record, making appropriate referrals as documented, and communicating necessary findings to members of the patients care team.     C. Leita Sanders, MD Advanced Bronchoscopy PCCM [facility]    *This note was generated using voice recognition software/Dragon and/or AI transcription program.  Despite best efforts to proofread, errors can occur which can change the meaning. Any transcriptional errors that result from this process are unintentional and may not be fully corrected at the time of dictation.

## 2024-05-13 NOTE — Discharge Instructions (Addendum)
 We are covering you with antibiotics for possible COPD exacerbation, pneumonia.  We discussed admission to the hospital but given your breathing is very well you preferred outpatient treatment.  For your neck swelling this is related to the below.  You should use sour lozenge or's, lemon juice to help stimulate the salivary gland to help move a stone through.  Is also possible there could be a stricture here at station discussed with ENT.  I will give you their number to call make a follow-up appointment return to the ER for fevers, worsening swelling or any other concerns  IMPRESSION: 1. Left submandibular sialadenitis. No convincing evidence of obstructing mass or calculus, although the submandibular duct is dilated. A stricture or small obstructing lesion cannot be definitively excluded.

## 2024-05-13 NOTE — Telephone Encounter (Signed)
 Received secure chat from [facility]:  This patient has a swelled throat and painful. Is there anyway that she can be seen today? She is here now getting a radiation tx. We are treating lung.   Informed to add to [facility] today, was informed patient already left   I have called to offer smc appt tomorrow prior to radiation appt. No answer, left message to call back.

## 2024-05-14 ENCOUNTER — Ambulatory Visit
Admission: RE | Admit: 2024-05-14 | Discharge: 2024-05-14 | Disposition: A | Discharge status: Home / Self Care | Source: Ambulatory Visit | Attending: Radiation Oncology | Admitting: Radiation Oncology

## 2024-05-14 ENCOUNTER — Other Ambulatory Visit: Payer: Self-pay

## 2024-05-14 DIAGNOSIS — Z510 Encounter for antineoplastic radiation therapy: Secondary | ICD-10-CM | POA: Diagnosis not present

## 2024-05-14 LAB — RAD ONC ARIA SESSION SUMMARY
Course Elapsed Days: 22
Plan Fractions Treated to Date: 15
Plan Prescribed Dose Per Fraction: 2 Gy
Plan Total Fractions Prescribed: 33
Plan Total Prescribed Dose: 66 Gy
Reference Point Dosage Given to Date: 30 Gy
Reference Point Session Dosage Given: 2 Gy
Session Number: 15

## 2024-05-17 ENCOUNTER — Ambulatory Visit
Admission: RE | Admit: 2024-05-17 | Discharge: 2024-05-17 | Disposition: A | Discharge status: Home / Self Care | Source: Ambulatory Visit | Attending: Radiation Oncology | Admitting: Radiation Oncology

## 2024-05-17 ENCOUNTER — Other Ambulatory Visit: Payer: Self-pay

## 2024-05-17 ENCOUNTER — Inpatient Hospital Stay

## 2024-05-17 DIAGNOSIS — Z510 Encounter for antineoplastic radiation therapy: Secondary | ICD-10-CM | POA: Diagnosis not present

## 2024-05-17 DIAGNOSIS — C3492 Malignant neoplasm of unspecified part of left bronchus or lung: Secondary | ICD-10-CM

## 2024-05-17 LAB — CBC WITH DIFFERENTIAL (CANCER CENTER ONLY)
Abs Immature Granulocytes: 0.04 K/uL (ref 0.00–0.07)
Basophils Absolute: 0 K/uL (ref 0.0–0.1)
Basophils Relative: 1 %
Eosinophils Absolute: 0.1 K/uL (ref 0.0–0.5)
Eosinophils Relative: 1 %
HCT: 36.7 % (ref 36.0–46.0)
Hemoglobin: 11.7 g/dL — ABNORMAL LOW (ref 12.0–15.0)
Immature Granulocytes: 1 %
Lymphocytes Relative: 19 %
Lymphs Abs: 1.4 K/uL (ref 0.7–4.0)
MCH: 26.7 pg (ref 26.0–34.0)
MCHC: 31.9 g/dL (ref 30.0–36.0)
MCV: 83.8 fL (ref 80.0–100.0)
Monocytes Absolute: 0.6 K/uL (ref 0.1–1.0)
Monocytes Relative: 8 %
Neutro Abs: 5.3 K/uL (ref 1.7–7.7)
Neutrophils Relative %: 70 %
Platelet Count: 402 K/uL — ABNORMAL HIGH (ref 150–400)
RBC: 4.38 MIL/uL (ref 3.87–5.11)
RDW: 15.7 % — ABNORMAL HIGH (ref 11.5–15.5)
WBC Count: 7.4 K/uL (ref 4.0–10.5)
nRBC: 0 % (ref 0.0–0.2)

## 2024-05-17 LAB — FERRITIN: Ferritin: 23 ng/mL (ref 11–307)

## 2024-05-17 LAB — RAD ONC ARIA SESSION SUMMARY
Course Elapsed Days: 25
Plan Fractions Treated to Date: 16
Plan Prescribed Dose Per Fraction: 2 Gy
Plan Total Fractions Prescribed: 33
Plan Total Prescribed Dose: 66 Gy
Reference Point Dosage Given to Date: 32 Gy
Reference Point Session Dosage Given: 2 Gy
Session Number: 16

## 2024-05-17 LAB — IRON AND TIBC
Iron: 41 ug/dL (ref 28–170)
Saturation Ratios: 10 % — ABNORMAL LOW (ref 10.4–31.8)
TIBC: 431 ug/dL (ref 250–450)
UIBC: 390 ug/dL

## 2024-05-17 LAB — TECHNOLOGIST SMEAR REVIEW
Plt Morphology: NORMAL
RBC MORPHOLOGY: NORMAL
WBC MORPHOLOGY: NORMAL

## 2024-05-18 ENCOUNTER — Other Ambulatory Visit: Payer: Self-pay

## 2024-05-18 ENCOUNTER — Ambulatory Visit
Admission: RE | Admit: 2024-05-18 | Discharge: 2024-05-18 | Disposition: A | Discharge status: Home / Self Care | Source: Ambulatory Visit | Attending: Radiation Oncology | Admitting: Radiation Oncology

## 2024-05-18 DIAGNOSIS — Z510 Encounter for antineoplastic radiation therapy: Secondary | ICD-10-CM | POA: Diagnosis not present

## 2024-05-18 LAB — RAD ONC ARIA SESSION SUMMARY
Course Elapsed Days: 26
Plan Fractions Treated to Date: 17
Plan Prescribed Dose Per Fraction: 2 Gy
Plan Total Fractions Prescribed: 33
Plan Total Prescribed Dose: 66 Gy
Reference Point Dosage Given to Date: 34 Gy
Reference Point Session Dosage Given: 2 Gy
Session Number: 17

## 2024-05-19 ENCOUNTER — Other Ambulatory Visit: Payer: Self-pay

## 2024-05-19 ENCOUNTER — Ambulatory Visit
Admission: RE | Admit: 2024-05-19 | Discharge: 2024-05-19 | Disposition: A | Discharge status: Home / Self Care | Source: Ambulatory Visit | Attending: Radiation Oncology | Admitting: Radiation Oncology

## 2024-05-19 DIAGNOSIS — Z510 Encounter for antineoplastic radiation therapy: Secondary | ICD-10-CM | POA: Diagnosis not present

## 2024-05-19 LAB — RAD ONC ARIA SESSION SUMMARY
Course Elapsed Days: 27
Plan Fractions Treated to Date: 18
Plan Prescribed Dose Per Fraction: 2 Gy
Plan Total Fractions Prescribed: 33
Plan Total Prescribed Dose: 66 Gy
Reference Point Dosage Given to Date: 36 Gy
Reference Point Session Dosage Given: 2 Gy
Session Number: 18

## 2024-05-20 ENCOUNTER — Ambulatory Visit
Admission: RE | Admit: 2024-05-20 | Discharge: 2024-05-20 | Disposition: A | Discharge status: Home / Self Care | Source: Ambulatory Visit | Attending: Radiation Oncology | Admitting: Radiation Oncology

## 2024-05-20 ENCOUNTER — Inpatient Hospital Stay

## 2024-05-20 ENCOUNTER — Other Ambulatory Visit: Payer: Self-pay

## 2024-05-20 DIAGNOSIS — Z510 Encounter for antineoplastic radiation therapy: Secondary | ICD-10-CM | POA: Diagnosis not present

## 2024-05-20 DIAGNOSIS — C3492 Malignant neoplasm of unspecified part of left bronchus or lung: Secondary | ICD-10-CM

## 2024-05-20 LAB — RAD ONC ARIA SESSION SUMMARY
Course Elapsed Days: 28
Plan Fractions Treated to Date: 19
Plan Prescribed Dose Per Fraction: 2 Gy
Plan Total Fractions Prescribed: 33
Plan Total Prescribed Dose: 66 Gy
Reference Point Dosage Given to Date: 38 Gy
Reference Point Session Dosage Given: 2 Gy
Session Number: 19

## 2024-05-20 LAB — CBC (CANCER CENTER ONLY)
HCT: 33 % — ABNORMAL LOW (ref 36.0–46.0)
Hemoglobin: 10.5 g/dL — ABNORMAL LOW (ref 12.0–15.0)
MCH: 26.8 pg (ref 26.0–34.0)
MCHC: 31.8 g/dL (ref 30.0–36.0)
MCV: 84.2 fL (ref 80.0–100.0)
Platelet Count: 331 K/uL (ref 150–400)
RBC: 3.92 MIL/uL (ref 3.87–5.11)
RDW: 16.1 % — ABNORMAL HIGH (ref 11.5–15.5)
WBC Count: 7.1 K/uL (ref 4.0–10.5)
nRBC: 0 % (ref 0.0–0.2)

## 2024-05-21 ENCOUNTER — Other Ambulatory Visit: Payer: Self-pay

## 2024-05-21 ENCOUNTER — Ambulatory Visit
Admission: RE | Admit: 2024-05-21 | Discharge: 2024-05-21 | Disposition: A | Discharge status: Home / Self Care | Source: Ambulatory Visit | Attending: Radiation Oncology | Admitting: Radiation Oncology

## 2024-05-21 DIAGNOSIS — Z510 Encounter for antineoplastic radiation therapy: Secondary | ICD-10-CM | POA: Diagnosis not present

## 2024-05-21 LAB — RAD ONC ARIA SESSION SUMMARY
Course Elapsed Days: 29
Plan Fractions Treated to Date: 20
Plan Prescribed Dose Per Fraction: 2 Gy
Plan Total Fractions Prescribed: 33
Plan Total Prescribed Dose: 66 Gy
Reference Point Dosage Given to Date: 40 Gy
Reference Point Session Dosage Given: 2 Gy
Session Number: 20

## 2024-05-24 ENCOUNTER — Other Ambulatory Visit: Payer: Self-pay

## 2024-05-24 ENCOUNTER — Inpatient Hospital Stay: Admitting: Oncology

## 2024-05-24 ENCOUNTER — Ambulatory Visit
Admission: RE | Admit: 2024-05-24 | Discharge: 2024-05-24 | Disposition: A | Discharge status: Home / Self Care | Source: Ambulatory Visit | Attending: Radiation Oncology | Admitting: Radiation Oncology

## 2024-05-24 DIAGNOSIS — Z510 Encounter for antineoplastic radiation therapy: Secondary | ICD-10-CM | POA: Diagnosis not present

## 2024-05-24 LAB — RAD ONC ARIA SESSION SUMMARY
Course Elapsed Days: 32
Plan Fractions Treated to Date: 21
Plan Prescribed Dose Per Fraction: 2 Gy
Plan Total Fractions Prescribed: 33
Plan Total Prescribed Dose: 66 Gy
Reference Point Dosage Given to Date: 42 Gy
Reference Point Session Dosage Given: 2 Gy
Session Number: 21

## 2024-05-24 LAB — CALR +MPL + E12-E15  (REFLEX)

## 2024-05-24 LAB — JAK2 V617F RFX CALR/MPL/E12-15

## 2024-05-25 ENCOUNTER — Ambulatory Visit
Admission: RE | Admit: 2024-05-25 | Discharge: 2024-05-25 | Disposition: A | Discharge status: Home / Self Care | Source: Ambulatory Visit | Attending: Radiation Oncology | Admitting: Radiation Oncology

## 2024-05-25 ENCOUNTER — Other Ambulatory Visit: Payer: Self-pay

## 2024-05-25 DIAGNOSIS — Z510 Encounter for antineoplastic radiation therapy: Secondary | ICD-10-CM | POA: Diagnosis not present

## 2024-05-25 LAB — RAD ONC ARIA SESSION SUMMARY
Course Elapsed Days: 33
Plan Fractions Treated to Date: 22
Plan Prescribed Dose Per Fraction: 2 Gy
Plan Total Fractions Prescribed: 33
Plan Total Prescribed Dose: 66 Gy
Reference Point Dosage Given to Date: 44 Gy
Reference Point Session Dosage Given: 2 Gy
Session Number: 22

## 2024-05-26 ENCOUNTER — Ambulatory Visit
Admission: RE | Admit: 2024-05-26 | Discharge: 2024-05-26 | Disposition: A | Discharge status: Home / Self Care | Source: Ambulatory Visit | Attending: Radiation Oncology | Admitting: Radiation Oncology

## 2024-05-26 ENCOUNTER — Other Ambulatory Visit: Payer: Self-pay

## 2024-05-26 DIAGNOSIS — Z510 Encounter for antineoplastic radiation therapy: Secondary | ICD-10-CM | POA: Diagnosis not present

## 2024-05-26 LAB — RAD ONC ARIA SESSION SUMMARY
Course Elapsed Days: 34
Plan Fractions Treated to Date: 23
Plan Prescribed Dose Per Fraction: 2 Gy
Plan Total Fractions Prescribed: 33
Plan Total Prescribed Dose: 66 Gy
Reference Point Dosage Given to Date: 46 Gy
Reference Point Session Dosage Given: 2 Gy
Session Number: 23

## 2024-05-27 ENCOUNTER — Ambulatory Visit
Admission: RE | Admit: 2024-05-27 | Discharge: 2024-05-27 | Disposition: A | Discharge status: Home / Self Care | Source: Ambulatory Visit | Attending: Radiation Oncology | Admitting: Radiation Oncology

## 2024-05-27 ENCOUNTER — Other Ambulatory Visit: Payer: Self-pay

## 2024-05-27 ENCOUNTER — Inpatient Hospital Stay

## 2024-05-27 DIAGNOSIS — Z510 Encounter for antineoplastic radiation therapy: Secondary | ICD-10-CM | POA: Diagnosis not present

## 2024-05-27 DIAGNOSIS — C3492 Malignant neoplasm of unspecified part of left bronchus or lung: Secondary | ICD-10-CM

## 2024-05-27 LAB — RAD ONC ARIA SESSION SUMMARY
Course Elapsed Days: 35
Plan Fractions Treated to Date: 24
Plan Prescribed Dose Per Fraction: 2 Gy
Plan Total Fractions Prescribed: 33
Plan Total Prescribed Dose: 66 Gy
Reference Point Dosage Given to Date: 48 Gy
Reference Point Session Dosage Given: 2 Gy
Session Number: 24

## 2024-05-27 LAB — CBC (CANCER CENTER ONLY)
HCT: 34.2 % — ABNORMAL LOW (ref 36.0–46.0)
Hemoglobin: 11 g/dL — ABNORMAL LOW (ref 12.0–15.0)
MCH: 27.1 pg (ref 26.0–34.0)
MCHC: 32.2 g/dL (ref 30.0–36.0)
MCV: 84.2 fL (ref 80.0–100.0)
Platelet Count: 346 K/uL (ref 150–400)
RBC: 4.06 MIL/uL (ref 3.87–5.11)
RDW: 17.9 % — ABNORMAL HIGH (ref 11.5–15.5)
WBC Count: 6.1 K/uL (ref 4.0–10.5)
nRBC: 0 % (ref 0.0–0.2)

## 2024-05-28 ENCOUNTER — Ambulatory Visit
Admission: RE | Admit: 2024-05-28 | Discharge: 2024-05-28 | Disposition: A | Discharge status: Home / Self Care | Source: Ambulatory Visit | Attending: Radiation Oncology | Admitting: Radiation Oncology

## 2024-05-28 ENCOUNTER — Other Ambulatory Visit: Payer: Self-pay

## 2024-05-28 ENCOUNTER — Inpatient Hospital Stay: Admitting: Oncology

## 2024-05-28 DIAGNOSIS — D75838 Other thrombocytosis: Secondary | ICD-10-CM | POA: Insufficient documentation

## 2024-05-28 DIAGNOSIS — F17210 Nicotine dependence, cigarettes, uncomplicated: Secondary | ICD-10-CM | POA: Diagnosis not present

## 2024-05-28 DIAGNOSIS — C3432 Malignant neoplasm of lower lobe, left bronchus or lung: Secondary | ICD-10-CM | POA: Diagnosis not present

## 2024-05-28 DIAGNOSIS — Z510 Encounter for antineoplastic radiation therapy: Secondary | ICD-10-CM | POA: Insufficient documentation

## 2024-05-28 LAB — RAD ONC ARIA SESSION SUMMARY
Course Elapsed Days: 36
Plan Fractions Treated to Date: 25
Plan Prescribed Dose Per Fraction: 2 Gy
Plan Total Fractions Prescribed: 33
Plan Total Prescribed Dose: 66 Gy
Reference Point Dosage Given to Date: 50 Gy
Reference Point Session Dosage Given: 2 Gy
Session Number: 25

## 2024-05-31 ENCOUNTER — Ambulatory Visit

## 2024-06-01 ENCOUNTER — Ambulatory Visit
Admission: RE | Admit: 2024-06-01 | Discharge: 2024-06-01 | Disposition: A | Discharge status: Home / Self Care | Source: Ambulatory Visit | Attending: Radiation Oncology | Admitting: Radiation Oncology

## 2024-06-01 ENCOUNTER — Other Ambulatory Visit: Payer: Self-pay

## 2024-06-01 DIAGNOSIS — Z510 Encounter for antineoplastic radiation therapy: Secondary | ICD-10-CM | POA: Diagnosis not present

## 2024-06-01 LAB — RAD ONC ARIA SESSION SUMMARY
Course Elapsed Days: 40
Plan Fractions Treated to Date: 26
Plan Prescribed Dose Per Fraction: 2 Gy
Plan Total Fractions Prescribed: 33
Plan Total Prescribed Dose: 66 Gy
Reference Point Dosage Given to Date: 52 Gy
Reference Point Session Dosage Given: 2 Gy
Session Number: 26

## 2024-06-02 ENCOUNTER — Ambulatory Visit
Admission: RE | Admit: 2024-06-02 | Discharge: 2024-06-02 | Disposition: A | Discharge status: Home / Self Care | Source: Ambulatory Visit | Attending: Radiation Oncology | Admitting: Radiation Oncology

## 2024-06-02 ENCOUNTER — Other Ambulatory Visit: Payer: Self-pay

## 2024-06-02 DIAGNOSIS — Z510 Encounter for antineoplastic radiation therapy: Secondary | ICD-10-CM | POA: Diagnosis not present

## 2024-06-02 LAB — RAD ONC ARIA SESSION SUMMARY
Course Elapsed Days: 41
Plan Fractions Treated to Date: 27
Plan Prescribed Dose Per Fraction: 2 Gy
Plan Total Fractions Prescribed: 33
Plan Total Prescribed Dose: 66 Gy
Reference Point Dosage Given to Date: 54 Gy
Reference Point Session Dosage Given: 2 Gy
Session Number: 27

## 2024-06-03 ENCOUNTER — Other Ambulatory Visit: Payer: Self-pay

## 2024-06-03 ENCOUNTER — Inpatient Hospital Stay

## 2024-06-03 ENCOUNTER — Ambulatory Visit
Admission: RE | Admit: 2024-06-03 | Discharge: 2024-06-03 | Disposition: A | Discharge status: Home / Self Care | Source: Ambulatory Visit | Attending: Radiation Oncology | Admitting: Radiation Oncology

## 2024-06-03 DIAGNOSIS — C3492 Malignant neoplasm of unspecified part of left bronchus or lung: Secondary | ICD-10-CM

## 2024-06-03 DIAGNOSIS — F17210 Nicotine dependence, cigarettes, uncomplicated: Secondary | ICD-10-CM | POA: Insufficient documentation

## 2024-06-03 DIAGNOSIS — C3432 Malignant neoplasm of lower lobe, left bronchus or lung: Secondary | ICD-10-CM | POA: Insufficient documentation

## 2024-06-03 DIAGNOSIS — D509 Iron deficiency anemia, unspecified: Secondary | ICD-10-CM | POA: Insufficient documentation

## 2024-06-03 DIAGNOSIS — Z510 Encounter for antineoplastic radiation therapy: Secondary | ICD-10-CM | POA: Diagnosis not present

## 2024-06-03 LAB — RAD ONC ARIA SESSION SUMMARY
Course Elapsed Days: 42
Plan Fractions Treated to Date: 28
Plan Prescribed Dose Per Fraction: 2 Gy
Plan Total Fractions Prescribed: 33
Plan Total Prescribed Dose: 66 Gy
Reference Point Dosage Given to Date: 56 Gy
Reference Point Session Dosage Given: 2 Gy
Session Number: 28

## 2024-06-03 LAB — CBC (CANCER CENTER ONLY)
HCT: 35 % — ABNORMAL LOW (ref 36.0–46.0)
Hemoglobin: 11.1 g/dL — ABNORMAL LOW (ref 12.0–15.0)
MCH: 27.3 pg (ref 26.0–34.0)
MCHC: 31.7 g/dL (ref 30.0–36.0)
MCV: 86 fL (ref 80.0–100.0)
Platelet Count: 352 K/uL (ref 150–400)
RBC: 4.07 MIL/uL (ref 3.87–5.11)
RDW: 18.6 % — ABNORMAL HIGH (ref 11.5–15.5)
WBC Count: 4.8 K/uL (ref 4.0–10.5)
nRBC: 0 % (ref 0.0–0.2)

## 2024-06-04 ENCOUNTER — Ambulatory Visit
Admission: RE | Admit: 2024-06-04 | Discharge: 2024-06-04 | Disposition: A | Discharge status: Home / Self Care | Source: Ambulatory Visit | Attending: Radiation Oncology | Admitting: Radiation Oncology

## 2024-06-04 ENCOUNTER — Other Ambulatory Visit: Payer: Self-pay

## 2024-06-04 DIAGNOSIS — Z510 Encounter for antineoplastic radiation therapy: Secondary | ICD-10-CM | POA: Diagnosis not present

## 2024-06-04 LAB — RAD ONC ARIA SESSION SUMMARY
Course Elapsed Days: 43
Plan Fractions Treated to Date: 29
Plan Prescribed Dose Per Fraction: 2 Gy
Plan Total Fractions Prescribed: 33
Plan Total Prescribed Dose: 66 Gy
Reference Point Dosage Given to Date: 58 Gy
Reference Point Session Dosage Given: 2 Gy
Session Number: 29

## 2024-06-07 ENCOUNTER — Ambulatory Visit
Admission: RE | Admit: 2024-06-07 | Discharge: 2024-06-07 | Disposition: A | Discharge status: Home / Self Care | Source: Ambulatory Visit | Attending: Radiation Oncology | Admitting: Radiation Oncology

## 2024-06-07 ENCOUNTER — Other Ambulatory Visit: Payer: Self-pay

## 2024-06-07 DIAGNOSIS — Z510 Encounter for antineoplastic radiation therapy: Secondary | ICD-10-CM | POA: Diagnosis not present

## 2024-06-07 LAB — RAD ONC ARIA SESSION SUMMARY
Course Elapsed Days: 46
Plan Fractions Treated to Date: 30
Plan Prescribed Dose Per Fraction: 2 Gy
Plan Total Fractions Prescribed: 33
Plan Total Prescribed Dose: 66 Gy
Reference Point Dosage Given to Date: 60 Gy
Reference Point Session Dosage Given: 2 Gy
Session Number: 30

## 2024-06-08 ENCOUNTER — Other Ambulatory Visit: Payer: Self-pay

## 2024-06-08 ENCOUNTER — Ambulatory Visit

## 2024-06-08 ENCOUNTER — Inpatient Hospital Stay (HOSPITAL_BASED_OUTPATIENT_CLINIC_OR_DEPARTMENT_OTHER): Admitting: Oncology

## 2024-06-08 ENCOUNTER — Ambulatory Visit
Admission: RE | Admit: 2024-06-08 | Discharge: 2024-06-08 | Disposition: A | Discharge status: Home / Self Care | Source: Ambulatory Visit | Attending: Radiation Oncology | Admitting: Radiation Oncology

## 2024-06-08 ENCOUNTER — Encounter: Payer: Self-pay | Admitting: Oncology

## 2024-06-08 VITALS — BP 127/87 | HR 99 | Temp 97.8°F | Resp 16 | Wt 152.0 lb

## 2024-06-08 DIAGNOSIS — J449 Chronic obstructive pulmonary disease, unspecified: Secondary | ICD-10-CM

## 2024-06-08 DIAGNOSIS — C3492 Malignant neoplasm of unspecified part of left bronchus or lung: Secondary | ICD-10-CM | POA: Diagnosis not present

## 2024-06-08 DIAGNOSIS — D509 Iron deficiency anemia, unspecified: Secondary | ICD-10-CM | POA: Insufficient documentation

## 2024-06-08 DIAGNOSIS — Z510 Encounter for antineoplastic radiation therapy: Secondary | ICD-10-CM | POA: Diagnosis not present

## 2024-06-08 LAB — RAD ONC ARIA SESSION SUMMARY
Course Elapsed Days: 47
Plan Fractions Treated to Date: 31
Plan Prescribed Dose Per Fraction: 2 Gy
Plan Total Fractions Prescribed: 33
Plan Total Prescribed Dose: 66 Gy
Reference Point Dosage Given to Date: 62 Gy
Reference Point Session Dosage Given: 2 Gy
Session Number: 31

## 2024-06-08 MED ORDER — IRON-VITAMIN C 65-125 MG PO TABS: 1.0000 | ORAL_TABLET | Freq: Every day | ORAL | 2 refills | Status: AC

## 2024-06-08 NOTE — Assessment & Plan Note (Signed)
 Recommend patient to continue follow-up with pulmonology.

## 2024-06-08 NOTE — Assessment & Plan Note (Signed)
 Unknown etiology. Lab Results  Component Value Date   HGB 11.1 (L) 06/03/2024   TIBC 431 05/17/2024   IRONPCTSAT 10 (L) 05/17/2024   FERRITIN 23 05/17/2024    Recommend patient to start oral iron  supplementation. In the future, consider referral to gastroenterology for further evaluation

## 2024-06-08 NOTE — Assessment & Plan Note (Addendum)
 Imaging findings and pathology results were reviewed and discussed with patient. 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  PET scan showed no hypermetabolic lymphadenopathy.  Biopsy of station 7 was negative.  Discussed with pulmonology, intra bronchoscopy impression of station 7 was suspicious. Patient declined surgery evaluation.  She is also not interested in mediastinoscopy evaluation of lymph node involvement. Patient is currently getting radiation.  Overall she tolerates well.  She will need surveillance CT scan for close monitoring.

## 2024-06-08 NOTE — Progress Notes (Signed)
 Hematology/Oncology Progress note [phone] [phone]        REFERRING PROVIDER: [facility]    CHIEF COMPLAINTS/PURPOSE OF CONSULTATION:  Left lower lung squamous cell carcinoma  ASSESSMENT & PLAN:   Cancer Staging  Primary lung squamous cell carcinoma, left (HCC) Staging form: Lung, AJCC V9 - Clinical stage from 04/07/2024: Stage IB (cT2a, cN0, cM0) - Signed by Babara Call, MD on 04/07/2024   Primary lung squamous cell carcinoma, left [facility]) Imaging findings and pathology results were reviewed and discussed with patient. 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  PET scan showed no hypermetabolic lymphadenopathy.  Biopsy of station 7 was negative.  Discussed with pulmonology, intra bronchoscopy impression of station 7 was suspicious. Patient declined surgery evaluation.  She is also not interested in mediastinoscopy evaluation of lymph node involvement. Patient is currently getting radiation.  Overall she tolerates well.  She will need surveillance CT scan for close monitoring.  COPD (chronic obstructive pulmonary disease) (HCC) Recommend patient to continue follow-up with pulmonology.  Iron  deficiency anemia Unknown etiology. Lab Results  Component Value Date   HGB 11.1 (L) 06/03/2024   TIBC 431 05/17/2024   IRONPCTSAT 10 (L) 05/17/2024   FERRITIN 23 05/17/2024    Recommend patient to start oral iron  supplementation. In the future, consider referral to gastroenterology for further evaluation   Orders Placed This Encounter  Procedures   CBC with Differential (Cancer Center Only)    Standing Status:   Future    Expected Date:   10/08/2024    Expiration Date:   01/06/2025   CMP (Cancer Center only)    Standing Status:   Future    Expected Date:   10/08/2024    Expiration Date:   01/06/2025   Iron  and TIBC    Standing Status:   Future    Expected Date:   10/08/2024    Expiration Date:   01/06/2025    Ferritin    Standing Status:   Future    Expected Date:   10/08/2024    Expiration Date:   01/06/2025   Retic Panel    Standing Status:   Future    Expected Date:   10/08/2024    Expiration Date:   01/06/2025   Follow-up 4 months All questions were answered. The patient knows to call the clinic with any problems, questions or concerns.  Call Babara, MD, PhD [facility] Hematology Oncology 06/08/2024    HISTORY OF PRESENTING ILLNESS:  Caitlyn Herrera 63 y.o. female presents to establish care for lung cancer I have reviewed her chart and materials related to her cancer extensively and collaborated history with the patient. Summary of oncologic history is as follows: Oncology History  Primary lung squamous cell carcinoma, left (HCC)  02/27/2024 Imaging   CT chest without contrast showed 1. Persistent but improved left lower lobe opacity with increasing volume loss, likely improving pneumonia. However, the left lower lobe bronchus is occluded with rounded mass effect on the bronchus by soft tissue density. Poorly marginated soft tissue density in the left infrahilar region in the region of bronchial occlusion. Findings are suspicious for centrally obstructing lesion. Hilar assessment is limited in the absence of IV contrast. Recommend further assessment with bronchoscopy. 2. The left upper lobe nodules on prior exam have resolved.   04/06/2024 Imaging   PET scan showed 1. Large area of hypermetabolic activity involving the left lower lobe bronchus consistent with known primary bronchogenic carcinoma.  Persistent left lower lobe collapse with volume loss. 2. No evidence of metastatic disease. 3. Infrarenal abdominal aortic aneurysm measuring up to 3.4 cm. Recommend surveillance ultrasound in 3 years. Reference: Journal of Vascular Surgery 67.1 (2018): 2-77. J Am Coll Radiol [phone]. 4.  Aortic Atherosclerosis    04/06/2024 Imaging   MRI brain with and without contrast showed  moderate nonspecific cerebral white matter disease.  No evidence of metastatic disease.   04/07/2024 Initial Diagnosis   Primary lung squamous cell carcinoma, left   February 2025, patient was diagnosed with community acquired pneumonia.  Treated with antibiotics.  CT of the chest showed left lower lobe consolidation and a left endobronchial lesion.  03/11/2024, repeat CT chest without contrast showed persistent but improved left lower lobe opacity with increasing volume loss.  Left lower lobe bronchus is occluded with rounded mass effect on the bronchus by soft tissue density.  03/29/2024 patient's status post bronchoscopy biopsy. Lung, biopsy, left mainstem - SQUAMOUS CELL CARCINOMA, KERATINIZING, INVASIVE.  1. Fine Needle Aspiration Lymph Node 7, station 7 NEGATIVE FOR MALIGNANCY 2. Bronchial Brushing, left main stem POSITIVE FOR MALIGNANT CELLS, CONSISTENT WITH SQUAMOUS CELL CARCINOMA. 3. Bronchial Lavage, left POSITIVE FOR MALIGNANT CELLS, CONSISTENT WITH SQUAMOUS CELL CARCINOMA    04/07/2024 Cancer Staging   Staging form: Lung, AJCC V9 - Clinical stage from 04/07/2024: Stage IB (cT2a, cN0, cM0) - Signed by Babara Call, MD on 04/07/2024 Stage prefix: Initial diagnosis Method of lymph node assessment: Clinical    Patient presents to clinic for discussion of pathology reports and treatment plans. She has noticed weight loss about 15 pounds over the past 2 months.  She has no appetite.  She has left anterior chest wall pain, no exacerbating or alleviating factors.  She takes Tylenol  with no improvement. She reports feeling shortness of breath and wheezing.  She uses inhalers.  She is a smoker currently smokes 2 cigarettes/day.       MEDICAL HISTORY:  Past Medical History:  Diagnosis Date   Abdominal aortic ectasia (HCC)    a. 11/2017 CTA chest/abd/pelvis: 2.5 cm abd ao ectasia -->rec f/u u/s in 5 yrs.   Anemia    Angina at rest [facility])    Aortic atherosclerosis (HCC)    Asthma     CAD (coronary artery disease)    a. 11/04/17 Cath: Native multivessel dzs-->CABG x 3 (LIMA->LAD, VG->OM2, VG->RPDA; b. 11/2017 MV: mid antlat/apical isch; c. 11/2017 Cath: LM 30d, LAD 60m, LCX 80p/m, OM1 90, OM2 80 (@ anastamosis of graft), RCA 80p, 162m, 90d, LIMA->LAD nl, VG->OM2 nl, VG->RPDA nl-->Med Rx.   Cancer [facility])    Carotid arterial disease (HCC)    a. 10/2017 Carotid U/S: 1-30% bilat ICA stenosis.   CHF (congestive heart failure) (HCC)    COPD (chronic obstructive pulmonary disease) (HCC)    DDD (degenerative disc disease), cervical    Dyspnea    GERD (gastroesophageal reflux disease)    History of echocardiogram    a. 11/04/2017 Echo: EF of 65-70%, no RWMA, nl LV diastolic fxn, nl RV size/fxn, mild TR; b. 03/2018 Echo: EF 60-65%, mild LVH, no rwma, mild MR. Mildy reduced RV fxn. Mild TR.   Hyperlipidemia    Hypertension    Left renal artery stenosis (HCC)    a. 11/2017 CTA Chest/Abd/Pelvis: 50-70% L RA stenosis.   Long-term use of aspirin  therapy    Noncompliance with medication treatment due to intermittent use of medication    NSTEMI (non-ST elevated myocardial infarction) (HCC) 10/2017  Palpitations    a. 02/2018 Event Monitor: wore for 2 days, 21 hrs - rare PAC's/PVC's. Avg HR 66 (42-103); b. 04/2018 Event Monitor: Wore for 14 days. RSR, 67 (49-111). No significant arrhythmias.   Persistent cough    Pulmonary HTN (HCC)    Pulmonary nodule, right    a. 10/2017 CT Chest: 5mm pulm nodule in R lung apex - rec f/u w/ non-contrast chest CT in 1 year.   S/P CABG x 3 11/12/2017   a.) LIMA-LAD, SVG-OM2, SVG-RPDA   Sepsis (HCC)    Streptococcal pneumonia (HCC) 12/19/2023   Thrombocytosis    Tobacco abuse    Type II diabetes mellitus (HCC)     SURGICAL HISTORY: Past Surgical History:  Procedure Laterality Date   BREAST BIOPSY Right 06/23/2018   US  guided biopsy - pathology benign   BREAST CYST EXCISION Right    CORONARY ARTERY BYPASS GRAFT N/A 11/12/2017   Procedure: CORONARY ARTERY  BYPASS GRAFTING (CABG) x Three , using left internal mammary artery and right leg greater saphenous vein harvested endoscopically;  Surgeon: Fleeta Hanford Coy, MD;  Location: [facility] OR;  Service: Open Heart Surgery;  Laterality: N/A;   DILATION AND CURETTAGE OF UTERUS     FLEXIBLE BRONCHOSCOPY Left 03/29/2024   Procedure: BRONCHOSCOPY, FLEXIBLE;  Surgeon: Tamea Dedra CROME, MD;  Location: [facility] ORS;  Service: Pulmonary;  Laterality: Left;   LAPAROSCOPIC CHOLECYSTECTOMY     LEFT HEART CATH AND CORONARY ANGIOGRAPHY N/A 11/04/2017   Procedure: LEFT HEART CATH AND CORONARY ANGIOGRAPHY;  Surgeon: Mady Bruckner, MD;  Location: [facility] INVASIVE CV LAB;  Service: Cardiovascular;  Laterality: N/A;   LEFT HEART CATH AND CORS/GRAFTS ANGIOGRAPHY N/A 12/09/2017   Procedure: LEFT HEART CATH AND CORS/GRAFTS ANGIOGRAPHY;  Surgeon: [name], Peter M, MD;  Location: [facility] INVASIVE CV LAB;  Service: Cardiovascular;  Laterality: N/A;   RIGHT/LEFT HEART CATH AND CORONARY ANGIOGRAPHY Bilateral 10/08/2022   Procedure: RIGHT/LEFT HEART CATH AND CORONARY ANGIOGRAPHY;  Surgeon: Mady Bruckner, MD;  Location: [facility] INVASIVE CV LAB;  Service: Cardiovascular;  Laterality: Bilateral;   TEE WITHOUT CARDIOVERSION N/A 11/12/2017   Procedure: TRANSESOPHAGEAL ECHOCARDIOGRAM (TEE);  Surgeon: Fleeta Hanford, Coy, MD;  Location: [facility] OR;  Service: Open Heart Surgery;  Laterality: N/A;   VIDEO BRONCHOSCOPY WITH ENDOBRONCHIAL ULTRASOUND Left 03/29/2024   Procedure: BRONCHOSCOPY, WITH EBUS;  Surgeon: Tamea Dedra CROME, MD;  Location: [facility] ORS;  Service: Pulmonary;  Laterality: Left;    SOCIAL HISTORY: Social History   Socioeconomic History   Marital status: Widowed    Spouse name: Not on file   Number of children: Not on file   Years of education: Not on file   Highest education level: Not on file  Occupational History    Employer: [employer]  Tobacco Use   Smoking status: Former    Current packs/day: 0.00    Average packs/day: 0.5  packs/day for 45.5 years (22.8 ttl pk-yrs)    Types: Cigarettes    Start date: 12    Quit date: 04/29/2024    Years since quitting: 0.1   Smokeless tobacco: Never   Tobacco comments:    5 cigarettes a day- khj 01/19/2024. Smoking some days 4 cigarettes a day. 03/16/2024. sd  Vaping Use   Vaping status: Never Used  Substance and Sexual Activity   Alcohol use: Not Currently    Comment: occassionally   Drug use: No   Sexual activity: Never  Other Topics Concern   Not on file  Social History Narrative  Not on file   Social Drivers of Health   Financial Resource Strain: Not on file  Food Insecurity: No Food Insecurity (12/19/2023)   Hunger Vital Sign    Worried About Running Out of Food in the Last Year: Never true    Ran Out of Food in the Last Year: Never true  Transportation Needs: No Transportation Needs (12/19/2023)   PRAPARE - Administrator, Civil Service (Medical): No    Lack of Transportation (Non-Medical): No  Physical Activity: Not on file  Stress: Not on file  Social Connections: Moderately Isolated (12/19/2023)   Social Connection and Isolation Panel    Frequency of Communication with Friends and Family: Three times a week    Frequency of Social Gatherings with Friends and Family: Twice a week    Attends Religious Services: 1 to 4 times per year    Active Member of [employer] or Organizations: No    Attends Banker Meetings: Never    Marital Status: Widowed  Intimate Partner Violence: Not At Risk (12/19/2023)   Humiliation, Afraid, Rape, and Kick questionnaire    Fear of Current or Ex-Partner: No    Emotionally Abused: No    Physically Abused: No    Sexually Abused: No    FAMILY HISTORY: Family History  Problem Relation Age of Onset   Hypertension Mother    Heart Problems Mother    Breast cancer Neg Hx     ALLERGIES:  has no known allergies.  MEDICATIONS:  Current Outpatient Medications  Medication Sig Dispense Refill   albuterol   (VENTOLIN  HFA) 108 (90 Base) MCG/ACT inhaler Inhale 2 puffs into the lungs every 6 (six) hours as needed for wheezing or shortness of breath. 8 g 2   aspirin  EC 81 MG tablet Take 1 tablet (81 mg total) by mouth daily. 90 tablet 3   atorvastatin  (LIPITOR ) 80 MG tablet TAKE 1 TABLET (80 MG TOTAL) BY MOUTH DAILY. (Patient taking differently: Take 80 mg by mouth every morning.) 30 tablet 10   azelastine (ASTELIN) 0.1 % nasal spray Place 2 sprays into both nostrils as needed.     benzonatate  (TESSALON ) 100 MG capsule Take 1 capsule (100 mg total) by mouth 2 (two) times daily as needed for cough. 20 capsule 2   carvedilol  (COREG ) 12.5 MG tablet TAKE 1 TABLET BY MOUTH TWICE DAILY *PATIENT NEEDS APPOINTMENT* 60 tablet 11   cetirizine (ZYRTEC) 10 MG tablet Take 10 mg by mouth every morning.     citalopram  (CELEXA ) 40 MG tablet Take 40 mg by mouth every morning.     cyclobenzaprine (FLEXERIL) 10 MG tablet Take 10 mg by mouth 3 (three) times daily as needed.     ezetimibe  (ZETIA ) 10 MG tablet TAKE 1 TABLET(10 MG) BY MOUTH DAILY (Patient taking differently: Take 10 mg by mouth every morning. TAKE 1 TABLET(10 MG) BY MOUTH DAILY) 30 tablet 10   fluticasone  (FLONASE) 50 MCG/ACT nasal spray 2 sprays as needed.     Fluticasone -Umeclidin-Vilant (TRELEGY ELLIPTA ) 200-62.5-25 MCG/ACT AEPB Inhale 1 puff into the lungs daily. 60 each 11   gabapentin  (NEURONTIN ) 300 MG capsule Take 300 mg by mouth every morning. Has not started.     Iron -Vitamin C  65-125 MG TABS Take 1 tablet by mouth daily. 30 tablet 2   isosorbide  mononitrate (IMDUR ) 120 MG 24 hr tablet Take 120 mg by mouth every morning.     losartan  (COZAAR ) 25 MG tablet Take 0.5 tablets (12.5 mg total) by mouth daily.  45 tablet 3   meloxicam (MOBIC) 15 MG tablet Take 15 mg by mouth daily as needed.     methylPREDNISolone  (MEDROL  DOSEPAK) 4 MG TBPK tablet Take 6 tablets x 1 day, then take 5 tablets x 1 day, then take 4 tablets x 1 day, then take 3 tablets x 1 day,  then 2 tablets x 1 day, then 1 tablet, then stop. 21 each 0   nitroGLYCERIN  (NITROSTAT ) 0.4 MG SL tablet DISSOLVE 1 TABLET UNDER THE TONGUE AS NEEDED FOR CHEST PAIN EVERY 5 MINUTES UP TO 3 TIMES. IF NO RELIEF CALL 911. 25 tablet 1   omeprazole  (PRILOSEC) 40 MG capsule TAKE 1 CAPSULE BY MOUTH EVERY DAY BEFORE BREAKFAST 30 capsule 1   ondansetron  (ZOFRAN ) 4 MG tablet Take 1 tablet (4 mg total) by mouth every 8 (eight) hours as needed for nausea or vomiting. 60 tablet 0   SitaGLIPtin-MetFORMIN  HCl (JANUMET XR) 50-1000 MG TB24 Take 1 tablet by mouth every morning.     tiZANidine (ZANAFLEX) 2 MG tablet Take 2 mg by mouth 3 (three) times daily.     traMADol  (ULTRAM ) 50 MG tablet Take 1 tablet (50 mg total) by mouth every 6 (six) hours as needed. 60 tablet 0   No current facility-administered medications for this visit.    Review of Systems  Constitutional:  Positive for fatigue. Negative for appetite change, chills, fever and unexpected weight change.  HENT:   Negative for hearing loss and voice change.   Eyes:  Negative for eye problems.  Respiratory:  Positive for cough and shortness of breath. Negative for chest tightness.   Cardiovascular:  Negative for chest pain.  Gastrointestinal:  Negative for abdominal distention, abdominal pain and blood in stool.  Endocrine: Negative for hot flashes.  Genitourinary:  Negative for difficulty urinating and frequency.   Musculoskeletal:  Negative for arthralgias.  Skin:  Negative for itching and rash.  Neurological:  Negative for extremity weakness.  Hematological:  Negative for adenopathy.  Psychiatric/Behavioral:  Negative for confusion.      PHYSICAL EXAMINATION: ECOG PERFORMANCE STATUS: 1 - Symptomatic but completely ambulatory  Vitals:   06/08/24 1450  BP: 127/87  Pulse: 99  Resp: 16  Temp: 97.8 F (36.6 C)  SpO2: 100%   Filed Weights   06/08/24 1450  Weight: 152 lb (68.9 kg)    Physical Exam Constitutional:      General: She is  not in acute distress.    Appearance: She is not diaphoretic.  HENT:     Head: Normocephalic and atraumatic.  Eyes:     General: No scleral icterus. Cardiovascular:     Rate and Rhythm: Normal rate and regular rhythm.     Heart sounds: No murmur heard. Pulmonary:     Effort: Pulmonary effort is normal. No respiratory distress.     Breath sounds: Wheezing present. No rales.     Comments: Decreased breath sounds bilaterally Abdominal:     General: There is no distension.     Palpations: Abdomen is soft.     Tenderness: There is no abdominal tenderness.  Musculoskeletal:        General: Normal range of motion.     Cervical back: Normal range of motion and neck supple.  Skin:    General: Skin is warm and dry.     Findings: No erythema.  Neurological:     Mental Status: She is alert and oriented to person, place, and time.     Cranial Nerves: No cranial  nerve deficit.     Motor: No abnormal muscle tone.     Coordination: Coordination normal.  Psychiatric:        Mood and Affect: Affect normal.      LABORATORY DATA:  I have reviewed the data as listed    Latest Ref Rng & Units 06/03/2024    3:39 PM 05/27/2024    3:42 PM 05/20/2024    2:06 PM  CBC  WBC 4.0 - 10.5 K/uL 4.8  6.1  7.1   Hemoglobin 12.0 - 15.0 g/dL 88.8  88.9  89.4   Hematocrit 36.0 - 46.0 % 35.0  34.2  33.0   Platelets 150 - 400 K/uL 352  346  331       Latest Ref Rng & Units 05/13/2024    5:20 PM 04/07/2024   11:44 AM 12/21/2023    4:13 AM  CMP  Glucose 70 - 99 mg/dL 94  891  74   BUN 8 - 23 mg/dL 14  11  12    Creatinine 0.44 - 1.00 mg/dL 9.13  9.11  9.19   Sodium 135 - 145 mmol/L 137  135  138   Potassium 3.5 - 5.1 mmol/L 4.4  4.4  3.7   Chloride 98 - 111 mmol/L 103  102  109   CO2 22 - 32 mmol/L 24  25  23    Calcium  8.9 - 10.3 mg/dL 9.6  9.4  8.4   Total Protein 6.5 - 8.1 g/dL  8.1    Total Bilirubin 0.0 - 1.2 mg/dL  0.9    Alkaline Phos 38 - 126 U/L  91    AST 15 - 41 U/L  14    ALT 0 - 44 U/L  10        RADIOGRAPHIC STUDIES: I have personally reviewed the radiological images as listed and agreed with the findings in the report. CT Soft Tissue Neck W Contrast Result Date: 05/13/2024 CLINICAL DATA:  Epiglottitis or tonsillitis suspected EXAM: CT NECK WITH CONTRAST TECHNIQUE: Multidetector CT imaging of the neck was performed using the standard protocol following the bolus administration of intravenous contrast. RADIATION DOSE REDUCTION: This exam was performed according to the departmental dose-optimization program which includes automated exposure control, adjustment of the mA and/or kV according to patient size and/or use of iterative reconstruction technique. CONTRAST:  75mL OMNIPAQUE  IOHEXOL  350 MG/ML SOLN COMPARISON:  None Available. FINDINGS: Pharynx and larynx: There is mild left parapharyngeal edema secondary to inflammatory change within the left submandibular gland. The palatine tonsils are unremarkable. There is no evidence of tonsillitis or peritonsillar abscess. The laryngeal tissues are unremarkable. Salivary glands: The left submandibular gland is markedly swollen and edematous. There is ductal dilatation, but no definite obstructing mass or calculus. There is extensive stranding of the adjacent soft tissues. The right submandibular gland and both parotid glands appear normal. Thyroid : Normal. Lymph nodes: None enlarged or abnormal density. Vascular: Mild calcific atheromatous disease within the carotid bulbs with no flow limiting stenosis. Limited intracranial: Negative. Visualized orbits: Normal. Mastoids and visualized paranasal sinuses: Negative. Skeleton: Unremarkable. Upper chest: Mild emphysema. Other: None. IMPRESSION: 1. Left submandibular sialadenitis. No convincing evidence of obstructing mass or calculus, although the submandibular duct is dilated. A stricture or small obstructing lesion cannot be definitively excluded. Electronically Signed   By: Evalene Coho M.D.   On:  05/13/2024 19:58   CT Angio Chest PE W and/or Wo Contrast Result Date: 05/13/2024 CLINICAL DATA:  Left chest pain and dyspnea EXAM:  CT ANGIOGRAPHY CHEST WITH CONTRAST TECHNIQUE: Multidetector CT imaging of the chest was performed using the standard protocol during bolus administration of intravenous contrast. Multiplanar CT image reconstructions and MIPs were obtained to evaluate the vascular anatomy. RADIATION DOSE REDUCTION: This exam was performed according to the departmental dose-optimization program which includes automated exposure control, adjustment of the mA and/or kV according to patient size and/or use of iterative reconstruction technique. CONTRAST:  75mL OMNIPAQUE  IOHEXOL  350 MG/ML SOLN COMPARISON:  PET-CT dated 04/06/2024, CT chest dated 02/27/2024 FINDINGS: Cardiovascular: The study is high quality for the evaluation of pulmonary embolism. There are no filling defects in the central, lobar, segmental or subsegmental pulmonary artery branches to suggest acute pulmonary embolism. Marked effacement of the left lower lobe pulmonary arteries. Great vessels are normal in course and caliber. Normal heart size. No significant pericardial fluid/thickening. Coronary artery calcifications and aortic atherosclerosis. Mediastinum/Nodes: Imaged thyroid  gland without nodules meeting criteria for imaging follow-up by size. Normal esophagus. No pathologically enlarged axillary, supraclavicular, mediastinal, or hilar lymph nodes. Lungs/Pleura: The central airways are patent. Centrilobular and paraseptal emphysema. Asymmetric left lung oligemia. Biapical pleural-parenchymal scarring. Persistent complete left lower lobe atelectasis secondary to ill-defined obstructing left hilar mass resulting in complete effacement of left lower lobe airways. Interval increased tree-in-bud nodularity of the lingula. No pneumothorax. No pleural effusion. Upper abdomen: Cholecystectomy. Musculoskeletal: No acute or abnormal lytic or  blastic osseous lesions. Median sternotomy wires are nondisplaced. Partially imaged left neck edema, better evaluated on same day CT neck. Review of the MIP images confirms the above findings. IMPRESSION: 1. No evidence of pulmonary embolism. 2. Persistent complete left lower lobe atelectasis secondary to ill-defined obstructing left hilar mass resulting in complete effacement of left lower lobe airways. 3. Interval increased tree-in-bud nodularity of the lingula, likely postobstructive infection/inflammation. 4. Partially imaged left neck edema, better evaluated on same day CT neck. 5. Aortic Atherosclerosis (ICD10-I70.0) and Emphysema (ICD10-J43.9). Coronary artery calcifications. Assessment for potential risk factor modification, dietary therapy or pharmacologic therapy may be warranted, if clinically indicated. Electronically Signed   By: Limin  Xu M.D.   On: 05/13/2024 19:05   DG Chest 2 View Result Date: 05/13/2024 CLINICAL DATA:  Chest pain.  History of lung cancer. EXAM: CHEST - 2 VIEW COMPARISON:  May 11, 2024. FINDINGS: The heart size and mediastinal contours are within normal limits. Status post coronary artery bypass graft. Right lung is clear. Stable left lower lobe atelectasis due to obstruction from bronchogenic carcinoma. The visualized skeletal structures are unremarkable. IMPRESSION: Stable left lower lobe atelectasis due to obstruction for bronchogenic carcinoma. Electronically Signed   By: Lynwood Landy Raddle M.D.   On: 05/13/2024 16:57   DG Chest 2 View Result Date: 05/11/2024 CLINICAL DATA:  Shortness of breath. Squamous cell carcinoma of left lung EXAM: CHEST - 2 VIEW COMPARISON:  03/29/2024 plain film.  PET of 04/06/2024. FINDINGS: Median sternotomy for CABG. Right upper quadrant surgical clips. Midline trachea. Normal heart size. Atherosclerosis in the transverse aorta. Left lower lobe collapse, similar. Clear right lung. IMPRESSION: Left lower lobe collapse, secondary to known central  primary bronchogenic carcinoma. No superimposed acute process. Aortic Atherosclerosis (ICD10-I70.0). Electronically Signed   By: Rockey Kilts M.D.   On: 05/11/2024 13:25

## 2024-06-09 ENCOUNTER — Other Ambulatory Visit: Payer: Self-pay

## 2024-06-09 ENCOUNTER — Ambulatory Visit

## 2024-06-09 ENCOUNTER — Ambulatory Visit
Admission: RE | Admit: 2024-06-09 | Discharge: 2024-06-09 | Disposition: A | Discharge status: Home / Self Care | Source: Ambulatory Visit | Attending: Radiation Oncology | Admitting: Radiation Oncology

## 2024-06-09 DIAGNOSIS — Z510 Encounter for antineoplastic radiation therapy: Secondary | ICD-10-CM | POA: Diagnosis not present

## 2024-06-09 LAB — RAD ONC ARIA SESSION SUMMARY
Course Elapsed Days: 48
Plan Fractions Treated to Date: 32
Plan Prescribed Dose Per Fraction: 2 Gy
Plan Total Fractions Prescribed: 33
Plan Total Prescribed Dose: 66 Gy
Reference Point Dosage Given to Date: 64 Gy
Reference Point Session Dosage Given: 2 Gy
Session Number: 32

## 2024-06-10 ENCOUNTER — Ambulatory Visit
Admission: RE | Admit: 2024-06-10 | Discharge: 2024-06-10 | Disposition: A | Discharge status: Home / Self Care | Source: Ambulatory Visit | Attending: Radiation Oncology | Admitting: Radiation Oncology

## 2024-06-10 ENCOUNTER — Other Ambulatory Visit: Payer: Self-pay

## 2024-06-10 DIAGNOSIS — Z510 Encounter for antineoplastic radiation therapy: Secondary | ICD-10-CM | POA: Diagnosis not present

## 2024-06-10 LAB — RAD ONC ARIA SESSION SUMMARY
Course Elapsed Days: 49
Plan Fractions Treated to Date: 33
Plan Prescribed Dose Per Fraction: 2 Gy
Plan Total Fractions Prescribed: 33
Plan Total Prescribed Dose: 66 Gy
Reference Point Dosage Given to Date: 66 Gy
Reference Point Session Dosage Given: 2 Gy
Session Number: 33

## 2024-06-11 NOTE — Radiation Completion Notes (Signed)
 Patient Name: Caitlyn Herrera, Caitlyn Herrera MRN: [id number] Date of Birth: 07-06-1961 Referring Physician: CARLIN FLOOR, M.D. Date of Service: 2024-06-11 Radiation Oncologist: Marcey Penton, M.D. [facility]                             RADIATION ONCOLOGY END OF TREATMENT NOTE     Diagnosis: C34.32 Malignant neoplasm of lower lobe, left bronchus or lung Staging on 2024-04-07: Primary lung squamous cell carcinoma, left (HCC) T=cT2a, N=cN0, M=cM0 Intent: Curative     HPI: Patient is a 63 year old female patient of Dr. Tamea followed for COPD with at asthma overlap.  She had a CT scan back in February showing an infrahilar masslike fullness of the left lower lobe concerning for malignancy.  She was admitted to [facility] for streptococcal pneumonia at that time.  She did develop some collapse of the left lower lobe.  She recently underwent bronchoscopy by Dr. Tamea showing extensive submucosal mass throughout the left mainstem bronchus carcinoma until proven otherwise.      ==========DELIVERED PLANS==========  First Treatment Date: 2024-04-22 Last Treatment Date: 2024-06-10   Plan Name: Lung_L Site: Bronchus, Left Technique: 3D Mode: Photon Dose Per Fraction: 2 Gy Prescribed Dose (Delivered / Prescribed): 66 Gy / 66 Gy Prescribed Fxs (Delivered / Prescribed): 33 / 33     ==========ON TREATMENT VISIT DATES========== 2024-04-27, 2024-05-04, 2024-05-11, 2024-05-18, 2024-05-25, 2024-06-01, 2024-06-08     ==========UPCOMING VISITS========== 07/15/2024 [facility] RAD ONCOLOGY FOLLOW UP 30 Chrystal, Marcey, MD        ==========APPENDIX - ON TREATMENT VISIT NOTES==========   See weekly On Treatment Notes in Epic for details in the Media tab (listed as Progress notes on the On Treatment Visit Dates listed above).

## 2024-07-15 ENCOUNTER — Ambulatory Visit
Admission: RE | Admit: 2024-07-15 | Discharge: 2024-07-15 | Discharge status: Home / Self Care | Source: Ambulatory Visit | Attending: Radiation Oncology | Admitting: Radiation Oncology

## 2024-07-15 ENCOUNTER — Encounter: Payer: Self-pay | Admitting: Radiation Oncology

## 2024-07-15 ENCOUNTER — Other Ambulatory Visit: Payer: Self-pay | Admitting: *Deleted

## 2024-07-15 VITALS — BP 118/87 | HR 69 | Resp 12 | Wt 152.0 lb

## 2024-07-15 DIAGNOSIS — C3492 Malignant neoplasm of unspecified part of left bronchus or lung: Secondary | ICD-10-CM | POA: Insufficient documentation

## 2024-07-15 NOTE — Progress Notes (Signed)
 Radiation Oncology Follow up Note  Name: Caitlyn Herrera   Date:   07/15/2024 MRN:  [id number] DOB: 1960-11-19    This 63 y.o. female presents to the clinic today for 1 month follow-up.  Status post radiation therapy for clinical stage Ib (cT2a N0 M0) squamous cell carcinoma of the left lung  REFERRING PROVIDER: [facility]  HPI: Patient is a 63 year old female now out 1 month having a pleated radiation therapy to her left lung for stage Ib squamous cell carcinoma seen today in routine follow-up she is doing well.  She specifically denies dysphagia cough hemoptysis chest tightness or any change in her pulmonary status..  COMPLICATIONS OF TREATMENT: none  FOLLOW UP COMPLIANCE: keeps appointments   PHYSICAL EXAM:  BP 118/87   Pulse 69   Resp 12   Wt 152 lb (68.9 kg)   BMI 24.53 kg/m  Well-developed well-nourished patient in NAD. HEENT reveals PERLA, EOMI, discs not visualized.  Oral cavity is clear. No oral mucosal lesions are identified. Neck is clear without evidence of cervical or supraclavicular adenopathy. Lungs are clear to A&P. Cardiac examination is essentially unremarkable with regular rate and rhythm without murmur rub or thrill. Abdomen is benign with no organomegaly or masses noted. Motor sensory and DTR levels are equal and symmetric in the upper and lower extremities. Cranial nerves II through XII are grossly intact. Proprioception is intact. No peripheral adenopathy or edema is identified. No motor or sensory levels are noted. Crude visual fields are within normal range.  RADIOLOGY RESULTS: CT scan ordered in 3 months  PLAN: Present time patient is is doing well 1 month out from radiation therapy to her chest.  Am pleased with her overall progress and low side effect profile.  Of asked to see her back in 3 months with a repeat CT scan prior to that visit.  Patient comprehends my recommendations well.  I would like to take this opportunity to thank you for  allowing me to participate in the care of your patient.Caitlyn Marcey Penton, MD

## 2024-07-19 ENCOUNTER — Encounter: Payer: Self-pay | Admitting: Radiation Oncology

## 2024-07-29 ENCOUNTER — Other Ambulatory Visit: Payer: Self-pay | Admitting: Physician Assistant

## 2024-07-29 DIAGNOSIS — Z1231 Encounter for screening mammogram for malignant neoplasm of breast: Secondary | ICD-10-CM

## 2024-08-03 ENCOUNTER — Ambulatory Visit: Attending: Medical | Admitting: Medical

## 2024-08-03 ENCOUNTER — Encounter: Payer: Self-pay | Admitting: Medical

## 2024-08-03 VITALS — BP 138/70 | HR 68 | Ht 66.0 in | Wt 157.2 lb

## 2024-08-03 DIAGNOSIS — J449 Chronic obstructive pulmonary disease, unspecified: Secondary | ICD-10-CM

## 2024-08-03 DIAGNOSIS — I2510 Atherosclerotic heart disease of native coronary artery without angina pectoris: Secondary | ICD-10-CM

## 2024-08-03 DIAGNOSIS — R0602 Shortness of breath: Secondary | ICD-10-CM | POA: Diagnosis not present

## 2024-08-03 DIAGNOSIS — Z720 Tobacco use: Secondary | ICD-10-CM

## 2024-08-03 DIAGNOSIS — C3492 Malignant neoplasm of unspecified part of left bronchus or lung: Secondary | ICD-10-CM

## 2024-08-03 DIAGNOSIS — I10 Essential (primary) hypertension: Secondary | ICD-10-CM | POA: Diagnosis not present

## 2024-08-03 DIAGNOSIS — E782 Mixed hyperlipidemia: Secondary | ICD-10-CM

## 2024-08-03 NOTE — Progress Notes (Unsigned)
 Cardiology Office Note   Date:  08/03/2024  ID:  [name], [name] 1961/03/01, MRN [id number] [facility]CP: [facility]  [facility] Providers Cardiologist:  Lonni Hanson, MD    History of Present Illness Caitlyn Herrera is a 63 y.o. female with a h/o CAD s/p 3V CABG 10/2017, mild pulmonary hypertension, COPD secondary to tobacco use, left lower lung squamous cell carcinoma, diabetes type 2, hypertension, hyperlipidemia, intermittent medical adherence, GERD who presents for follow-up of CAD.   Patient was admitted to the hospital in January 2019 with a non-STEMI and underwent diagnostic left heart cath which showed severe multivessel CAD.  Echo showed normal LVSF.  She subsequently underwent three-vessel CABG with LIMA to LAD, SVG to OM 2, and SVG to RPDA.  She required repeat left heart cath in February 2019 in the setting of subsequent admission with mild troponin elevation and abnormal stress testing which showed 3 out of 3 patent grafts with a native multivessel disease including 80% stenosis at the anastomosis of the SVG to OM 2.  This area was not felt to be amenable to PCI and she was medically managed.  Since her bypass, she has been evaluated multiple times for complaints of palpitations with cardiac monitoring being unrevealing in May 2019 and July 2019.  On both occasions, duration of monitor compliance was limited.  Patient reported chest pain that was felt to be acid reflux.  She also had residual shortness of breath that was felt to be multifactorial including underlying CAD, COPD and GERD.  Echo in 2021 showed EF of 60 to 65%, no wall motion abnormalities, mild LVH, normal LV diastolic function parameters.  Echo in August 2023 showed EF of 55 to 60%, no wall motion abnormality, normal LV diastolic function, trivial MR.  Patient had persistent chest heaviness and dyspnea in 2023 and underwent right left heart cath December 2023 that showed severe native  three-vessel CAD similar to prior cath in 2019 with widely patent LIMA to LAD, patent SVG to OM 2 with 80% stenosis in the OM 2 just distal to the anastomosis as well as moderate ectasia of the distal portion of the SVG, occluded SVG to RPDA which did not appear to be acute though was new when compared to catheter in 2019, normal left heart, right heart, pulmonary artery pressure, normal cardiac output and index.  Medical therapy and risk factor modification was recommended.  The patient was last seen 02/04/24 and was overall doing well. Hydrochlorothiazide  was stopped and she was started on Losartan  and Imdur .   Today, the patient is overall doing well. She was diagnosed with stage 1 Lung cancer and underwent radiation. She denies chest pain. She has occasional SOB and is wheezing. She is very little tired. Other than that she is OK. No lightheadedness, dizziness, lower leg edema.    Studies Reviewed        R/L heart cath 09/2022 Conclusions: Severe native three-vessel coronary artery disease, similar to prior catheterization on 12/09/2017. Widely patent LIMA-LAD. Patent SVG-OM2 with 80% stenosis in OM2 just distal to anastomosis.  There is also moderate ectasia of the distal portion of the SVG. Occluded SVG-rPDA, which does not appear acute but is new since last catheterization on 12/09/2017. Normal left heart, right heart, and pulmonary artery pressures. Normal Fick cardiac output/index. Small left radial artery, not well-suited for catheterization; successful left heart catheterization via the left ulnar artery.   Recommendations: Escalate antianginal therapy as tolerated.  We will increase  isosorbide  mononitrate to 120 mg daily and continue current dose of carvedilol . Aggressive secondary prevention of coronary artery disease; smoking cessation reinforced.   Lonni Hanson, MD [facility]  Coronary Diagrams   Diagnostic Dominance: Right  Intervention   Echo 05/2022 1. Left  ventricular ejection fraction, by estimation, is 55 to 60%. Left  ventricular ejection fraction by 2D MOD biplane is 56.9 %. The left  ventricle has normal function. The left ventricle has no regional wall  motion abnormalities. Left ventricular  diastolic parameters were normal. The average left ventricular global  longitudinal strain is -19.0 %. The global longitudinal strain is normal.   2. Right ventricular systolic function is normal. The right ventricular  size is normal.   3. The mitral valve is normal in structure. Trivial mitral valve  regurgitation.   4. The aortic valve is tricuspid. Aortic valve regurgitation is not  visualized.   5. The inferior vena cava is normal in size with greater than 50%  respiratory variability, suggesting right atrial pressure of 3 mmHg.        Physical Exam VS:  BP 138/70   Pulse 68   Ht 5' 6 (1.676 m)   Wt 157 lb 3.2 oz (71.3 kg)   SpO2 99%   BMI 25.37 kg/m        Wt Readings from Last 3 Encounters:  08/03/24 157 lb 3.2 oz (71.3 kg)  07/15/24 152 lb (68.9 kg)  06/08/24 152 lb (68.9 kg)    GEN: Well nourished, well developed in no acute distress NECK: No JVD; No carotid bruits CARDIAC: RRR, no murmurs, rubs, gallops RESPIRATORY:+wheezing and rhonchi  ABDOMEN: Soft, non-tender, non-distended EXTREMITIES:  No edema; No deformity   ASSESSMENT AND PLAN  CAD s/p CABG The patient denies anginal symptoms.  Last heart catheter December 2023 (report above) with no intervention performed.  Right heart cath showed normal pressures.  No ischemic workup at this time.  Continue aspirin , Lipitor , Coreg , Zetia , Imdur  and losartan .  HTN BP is reasonable. Continue Coreg  12.5 mg twice daily, losartan  12 at 5 mg daily and Imdur  120 mg daily  HLD LDL 52. Continue Lipitor  and Zetia   Chronic SOB COPD/Tobacco use Stage 1 lung cancer Since her last visit she was diagnosed with stage I lung cancer and underwent radiation.  I will update an  echocardiogram.she reports they will do a re-check in December.        Dispo: Follow-up in 6 months  Signed, [name], PA-C

## 2024-08-03 NOTE — Patient Instructions (Signed)
 Medication Instructions:  Your physician recommends that you continue on your current medications as directed. Please refer to the Current Medication list given to you today.    *If you need a refill on your cardiac medications before your next appointment, please call your pharmacy*  Lab Work: No labs ordered today    Testing/Procedures: Your physician has requested that you have an echocardiogram. Echocardiography is a painless test that uses sound waves to create images of your heart. It provides your doctor with information about the size and shape of your heart and how well your heart's chambers and valves are working.   You may receive an ultrasound enhancing agent through an IV if needed to better visualize your heart during the echo. This procedure takes approximately one hour.  There are no restrictions for this procedure.  This will take place at [address] [facility] [address] [facility], [place] [address]  Please note: We ask at that you not bring children with you during ultrasound (echo/ vascular) testing. Due to room size and safety concerns, children are not allowed in the ultrasound rooms during exams. Our front office staff cannot provide observation of children in our lobby area while testing is being conducted. An adult accompanying a patient to their appointment will only be allowed in the ultrasound room at the discretion of the ultrasound technician under special circumstances. We apologize for any inconvenience.   Follow-Up: At [facility], you and your health needs are our priority.  As part of our continuing mission to provide you with exceptional heart care, our providers are all part of one team.  This team includes your primary Cardiologist (physician) and Advanced Practice Providers or APPs (Physician Assistants and Nurse Practitioners) who all work together to provide you with the care you need, when you need it.  Your next appointment:   6  month(s)  Provider:   Lonni Hanson, MD or Cadence Franchester, PA-C

## 2024-08-05 DIAGNOSIS — Y92410 Unspecified street and highway as the place of occurrence of the external cause: Secondary | ICD-10-CM | POA: Diagnosis not present

## 2024-08-05 DIAGNOSIS — I2510 Atherosclerotic heart disease of native coronary artery without angina pectoris: Secondary | ICD-10-CM | POA: Insufficient documentation

## 2024-08-05 DIAGNOSIS — S39012A Strain of muscle, fascia and tendon of lower back, initial encounter: Secondary | ICD-10-CM | POA: Diagnosis not present

## 2024-08-05 DIAGNOSIS — Z951 Presence of aortocoronary bypass graft: Secondary | ICD-10-CM | POA: Insufficient documentation

## 2024-08-05 DIAGNOSIS — J449 Chronic obstructive pulmonary disease, unspecified: Secondary | ICD-10-CM | POA: Diagnosis not present

## 2024-08-05 DIAGNOSIS — R519 Headache, unspecified: Secondary | ICD-10-CM | POA: Diagnosis not present

## 2024-08-05 DIAGNOSIS — S3992XA Unspecified injury of lower back, initial encounter: Secondary | ICD-10-CM | POA: Diagnosis present

## 2024-08-06 ENCOUNTER — Other Ambulatory Visit: Payer: Self-pay

## 2024-08-06 ENCOUNTER — Emergency Department

## 2024-08-06 ENCOUNTER — Encounter: Payer: Self-pay | Admitting: Emergency Medicine

## 2024-08-06 ENCOUNTER — Emergency Department
Admission: EM | Admit: 2024-08-06 | Discharge: 2024-08-06 | Disposition: A | Discharge status: Home / Self Care | Attending: Emergency Medicine | Admitting: Emergency Medicine

## 2024-08-06 DIAGNOSIS — S39012A Strain of muscle, fascia and tendon of lower back, initial encounter: Secondary | ICD-10-CM

## 2024-08-06 MED ORDER — METHOCARBAMOL 500 MG PO TABS
500.0000 mg | ORAL_TABLET | Freq: Once | ORAL | Status: AC
  Administered 2024-08-06: 500 mg via ORAL
  Filled 2024-08-06: qty 1

## 2024-08-06 MED ORDER — ACETAMINOPHEN 500 MG PO TABS
1000.0000 mg | ORAL_TABLET | Freq: Once | ORAL | Status: AC
  Administered 2024-08-06: 1000 mg via ORAL
  Filled 2024-08-06: qty 2

## 2024-08-06 MED ORDER — LIDOCAINE 5 % EX PTCH
2.0000 | MEDICATED_PATCH | CUTANEOUS | Status: DC
  Administered 2024-08-06: 2 via TRANSDERMAL
  Filled 2024-08-06: qty 2

## 2024-08-06 MED ORDER — METHOCARBAMOL 500 MG PO TABS: 500.0000 mg | ORAL_TABLET | Freq: Three times a day (TID) | ORAL | 0 refills | Status: DC | PRN

## 2024-08-06 MED ORDER — KETOROLAC TROMETHAMINE 30 MG/ML IJ SOLN
30.0000 mg | Freq: Once | INTRAMUSCULAR | Status: AC
  Administered 2024-08-06: 30 mg via INTRAMUSCULAR
  Filled 2024-08-06: qty 1

## 2024-08-06 MED ORDER — LIDOCAINE 5 % EX PTCH: 1.0000 | MEDICATED_PATCH | Freq: Two times a day (BID) | CUTANEOUS | 1 refills | Status: AC

## 2024-08-06 NOTE — ED Notes (Signed)
 Patient ambulates with steady gait independently, states her ride is waiting for her outside. Escorted to ED Lobby.

## 2024-08-06 NOTE — Discharge Instructions (Signed)
 Use Tylenol for pain and fevers.  Up to 1000 mg per dose, up to 4 times per day.  Do not take more than 4000 mg of Tylenol/acetaminophen within 24 hours..  Please use lidocaine patches at your site of pain.  Apply 1 patch at a time, leave on for 12 hours, then remove for 12 hours.  12 hours on, 12 hours off.  Do not apply more than 1 patch at a time.  Use Robaxin muscle relaxer as needed for more severe/breakthrough pain, up to 3 times per day. This medication can make some people sleepy, so do not use while driving, working or Designer, television/film set
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
## 2024-08-26 ENCOUNTER — Other Ambulatory Visit: Payer: Self-pay | Admitting: Internal Medicine

## 2024-08-26 DIAGNOSIS — E785 Hyperlipidemia, unspecified: Secondary | ICD-10-CM

## 2024-09-05 ENCOUNTER — Emergency Department
Admission: EM | Admit: 2024-09-05 | Discharge: 2024-09-06 | Disposition: A | Attending: Emergency Medicine | Admitting: Emergency Medicine

## 2024-09-05 ENCOUNTER — Other Ambulatory Visit: Payer: Self-pay

## 2024-09-05 DIAGNOSIS — I110 Hypertensive heart disease with heart failure: Secondary | ICD-10-CM | POA: Insufficient documentation

## 2024-09-05 DIAGNOSIS — J449 Chronic obstructive pulmonary disease, unspecified: Secondary | ICD-10-CM | POA: Insufficient documentation

## 2024-09-05 DIAGNOSIS — M546 Pain in thoracic spine: Secondary | ICD-10-CM | POA: Diagnosis present

## 2024-09-05 DIAGNOSIS — I2510 Atherosclerotic heart disease of native coronary artery without angina pectoris: Secondary | ICD-10-CM | POA: Diagnosis not present

## 2024-09-05 DIAGNOSIS — M62830 Muscle spasm of back: Secondary | ICD-10-CM | POA: Insufficient documentation

## 2024-09-05 DIAGNOSIS — I509 Heart failure, unspecified: Secondary | ICD-10-CM | POA: Diagnosis not present

## 2024-09-05 NOTE — ED Triage Notes (Signed)
 Pt reports upper back pain that radiated down her back x 1 month, pt denies injury or trauma.

## 2024-09-06 MED ORDER — NAPROXEN 500 MG PO TABS: 500.0000 mg | ORAL_TABLET | Freq: Two times a day (BID) | ORAL | 2 refills | Status: DC

## 2024-09-06 MED ORDER — METHOCARBAMOL 500 MG PO TABS: 500.0000 mg | ORAL_TABLET | Freq: Three times a day (TID) | ORAL | 1 refills | Status: AC | PRN

## 2024-09-06 NOTE — ED Provider Notes (Signed)
 [facility] Provider Note    Event Date/Time   First MD Initiated Contact with Patient 09/05/24 2352     (approximate)   History   Back Pain   HPI  Caitlyn Herrera is a 63 y.o. female with a history of CAD, CHF, COPD, hypertension who presents with complaints of upper back pain.  Patient reports this has been present for nearly a month.  She reports it is worse with turning her neck.  No rash reported.  No chest pain.     Physical Exam   Triage Vital Signs: ED Triage Vitals  Encounter Vitals Group     BP 09/05/24 2149 120/77     Girls Systolic BP Percentile --      Girls Diastolic BP Percentile --      Boys Systolic BP Percentile --      Boys Diastolic BP Percentile --      Pulse Rate 09/05/24 2149 65     Resp 09/05/24 2149 19     Temp 09/05/24 2149 98 F (36.7 C)     Temp Source 09/06/24 0000 Oral     SpO2 09/05/24 2149 100 %     Weight 09/05/24 2148 72 kg (158 lb 11.7 oz)     Height 09/05/24 2148 1.676 m (5' 6)     Head Circumference --      Peak Flow --      Pain Score 09/05/24 2148 10     Pain Loc --      Pain Education --      Exclude from Growth Chart --     Most recent vital signs: Vitals:   09/05/24 2149 09/06/24 0000  BP: 120/77 (!) 117/58  Pulse: 65 65  Resp: 19 16  Temp: 98 F (36.7 C) 97.9 F (36.6 C)  SpO2: 100% 100%     General: Awake, no distress.  CV:  Good peripheral perfusion.  Resp:  Normal effort.  Abd:  No distention.  Other:  Back: Patient has point tenderness medial aspect of the left scapula which significantly worsens her pain consistent with muscle spasm, no rash   ED Results / Procedures / Treatments   Labs (all labs ordered are listed, but only abnormal results are displayed) Labs Reviewed - No data to display   EKG     RADIOLOGY     PROCEDURES:  Critical Care performed:   Procedures   MEDICATIONS ORDERED IN ED: Medications - No data to display   IMPRESSION / MDM /  ASSESSMENT AND PLAN / ED COURSE  I reviewed the triage vital signs and the nursing notes. Patient's presentation is most consistent with acute, uncomplicated illness.  Patient presents with back pain as detailed above, no red flag symptoms, she has point tenderness to the area which replicates her pain and makes her wince consistent with likely muscle spasm.  Will treat with NSAID, muscle relaxers, appropriate for outpatient follow-up, no indication for admission at this time, return precautions discussed, recommend outpatient follow-up with her PCP.        FINAL CLINICAL IMPRESSION(S) / ED DIAGNOSES   Final diagnoses:  Muscle spasm of back     Rx / DC Orders   ED Discharge Orders          Ordered    naproxen (NAPROSYN) 500 MG tablet  2 times daily with meals        09/06/24 0017    methocarbamol (ROBAXIN) 500 MG tablet  Every  8 hours PRN        09/06/24 0017             Note:  This document was prepared using Dragon voice recognition software and may include unintentional dictation errors.   Arlander Charleston, MD 09/06/24 [phone]

## 2024-09-15 ENCOUNTER — Other Ambulatory Visit: Payer: Self-pay

## 2024-09-15 ENCOUNTER — Telehealth: Payer: Self-pay

## 2024-09-15 DIAGNOSIS — Z1211 Encounter for screening for malignant neoplasm of colon: Secondary | ICD-10-CM

## 2024-09-15 MED ORDER — NA SULFATE-K SULFATE-MG SULF 17.5-3.13-1.6 GM/177ML PO SOLN: 354.0000 mL | Freq: Once | ORAL | 0 refills | Status: AC

## 2024-09-15 NOTE — Telephone Encounter (Signed)
 Per pt would like to schedule procedure.

## 2024-09-15 NOTE — Telephone Encounter (Signed)
 Cardiac clearance was faxed to Dr. Ulysses office.

## 2024-09-15 NOTE — Telephone Encounter (Signed)
 Gastroenterology Pre-Procedure Review  Cardiac clearance faxed to Dr. Lonni End  Request Date: 11/03/2024 Requesting Physician: Dr. Marinda  PATIENT REVIEW QUESTIONS: The patient responded to the following health history questions as indicated:    1. Are you having any GI issues? no 2. Do you have a personal history of Polyps? no 3. Do you have a family history of Colon Cancer or Polyps? no 4. Diabetes Mellitus? yes (metformin -hold 2 days) 5. Joint replacements in the past 12 months?no 6. Major health problems in the past 3 months?no 7. Any artificial heart valves, MVP, or defibrillator?no    MEDICATIONS & ALLERGIES:    Patient reports the following regarding taking any anticoagulation/antiplatelet therapy:   Plavix , Coumadin, Eliquis, Xarelto, Lovenox, Pradaxa, Brilinta, or Effient? no Aspirin ? yes (ASA 81MG )  Patient confirms/reports the following medications:  Current Outpatient Medications  Medication Sig Dispense Refill   albuterol  (VENTOLIN  HFA) 108 (90 Base) MCG/ACT inhaler Inhale 2 puffs into the lungs every 6 (six) hours as needed for wheezing or shortness of breath. 8 g 2   aspirin  EC 81 MG tablet Take 1 tablet (81 mg total) by mouth daily. 90 tablet 3   atorvastatin  (LIPITOR ) 80 MG tablet TAKE 1 TABLET BY MOUTH DAILY 90 tablet 3   azelastine (ASTELIN) 0.1 % nasal spray Place 2 sprays into both nostrils as needed.     benzonatate  (TESSALON ) 100 MG capsule Take 1 capsule (100 mg total) by mouth 2 (two) times daily as needed for cough. 20 capsule 2   carvedilol  (COREG ) 12.5 MG tablet TAKE 1 TABLET BY MOUTH TWICE DAILY *PATIENT NEEDS APPOINTMENT* 60 tablet 11   cetirizine (ZYRTEC) 10 MG tablet Take 10 mg by mouth every morning.     citalopram  (CELEXA ) 40 MG tablet Take 40 mg by mouth every morning.     cyclobenzaprine (FLEXERIL) 10 MG tablet Take 10 mg by mouth 3 (three) times daily as needed.     ezetimibe  (ZETIA ) 10 MG tablet TAKE 1 TABLET BY MOUTH DAILY 90 tablet 3    fluticasone  (FLONASE) 50 MCG/ACT nasal spray 2 sprays as needed.     Fluticasone -Umeclidin-Vilant (TRELEGY ELLIPTA ) 200-62.5-25 MCG/ACT AEPB Inhale 1 puff into the lungs daily. 60 each 11   gabapentin  (NEURONTIN ) 300 MG capsule Take 300 mg by mouth every morning. Has not started.     Iron -Vitamin C  65-125 MG TABS Take 1 tablet by mouth daily. 30 tablet 2   isosorbide  mononitrate (IMDUR ) 120 MG 24 hr tablet Take 120 mg by mouth every morning.     lidocaine  (LIDODERM ) 5 % Place 1 patch onto the skin every 12 (twelve) hours. Remove & Discard patch within 12 hours or as directed by MD 10 patch 1   losartan  (COZAAR ) 25 MG tablet Take 0.5 tablets (12.5 mg total) by mouth daily. 45 tablet 3   meloxicam (MOBIC) 15 MG tablet Take 15 mg by mouth daily as needed.     methocarbamol (ROBAXIN) 500 MG tablet Take 1 tablet (500 mg total) by mouth every 8 (eight) hours as needed for muscle spasms. 20 tablet 1   methylPREDNISolone  (MEDROL  DOSEPAK) 4 MG TBPK tablet Take 6 tablets x 1 day, then take 5 tablets x 1 day, then take 4 tablets x 1 day, then take 3 tablets x 1 day, then 2 tablets x 1 day, then 1 tablet, then stop. (Patient not taking: Reported on 08/03/2024) 21 each 0   naproxen (NAPROSYN) 500 MG tablet Take 1 tablet (500 mg total) by mouth 2 (  two) times daily with a meal. 20 tablet 2   nitroGLYCERIN  (NITROSTAT ) 0.4 MG SL tablet DISSOLVE 1 TABLET UNDER THE TONGUE AS NEEDED FOR CHEST PAIN EVERY 5 MINUTES UP TO 3 TIMES. IF NO RELIEF CALL 911. 25 tablet 1   omeprazole  (PRILOSEC) 40 MG capsule TAKE 1 CAPSULE BY MOUTH EVERY DAY BEFORE BREAKFAST 30 capsule 1   ondansetron  (ZOFRAN ) 4 MG tablet Take 1 tablet (4 mg total) by mouth every 8 (eight) hours as needed for nausea or vomiting. 60 tablet 0   SitaGLIPtin-MetFORMIN  HCl (JANUMET XR) 50-1000 MG TB24 Take 1 tablet by mouth every morning.     tiZANidine (ZANAFLEX) 2 MG tablet Take 2 mg by mouth 3 (three) times daily.     traMADol  (ULTRAM ) 50 MG tablet Take 1 tablet  (50 mg total) by mouth every 6 (six) hours as needed. 60 tablet 0   No current facility-administered medications for this visit.    Patient confirms/reports the following allergies:  No Known Allergies  No orders of the defined types were placed in this encounter.   AUTHORIZATION INFORMATION Primary Insurance: 1D#: Group #:  Secondary Insurance: 1D#: Group #:  SCHEDULE INFORMATION: Date: 11/03/2024 Time: Location: [facility] Dr. Marinda

## 2024-09-16 ENCOUNTER — Telehealth: Payer: Self-pay | Admitting: Internal Medicine

## 2024-09-16 NOTE — Telephone Encounter (Signed)
   Pre-operative Risk Assessment    Patient Name: Caitlyn Herrera  DOB: Apr 26, 1961 MRN: [id number]   Date of last office visit: 08/03/24 Date of next office visit: 01/28/25   Request for Surgical Clearance    Procedure:  Colonoscopy  Date of Surgery:  Clearance 11/03/24                                Surgeon:  Dr. Jayson Endow Surgeon's Group or Practice Name:  [facility] Phone number:  [phone] Fax number:  [phone]   Type of Clearance Requested:   - Medical    Type of Anesthesia:  General    Additional requests/questions:    Signed, Arsenio Celine GAILS   09/16/2024, 8:57 AM

## 2024-09-16 NOTE — Telephone Encounter (Signed)
   Patient Name: Caitlyn Herrera  DOB: 08-29-1961 MRN: [id number]  Primary Cardiologist: Lonni Hanson, MD  Chart reviewed as part of pre-operative protocol coverage.   The patient was seen by Cadence Franchester, PA-C last month.  Was doing well from a cardiovascular standpoint aside from some chronic shortness of breath that is multifactorial.  As long as symptoms remain stable and there are no new symptoms at the time of procedure, can undergo colonoscopy without any further cardiovascular testing.   Would recommend continuing aspirin  throughout procedure.  Will route this bundled recommendation to requesting provider via Epic fax function and remove from pre-op pool. Please call with questions.  Orren LOISE Fabry, PA-C 09/16/2024, 12:23 PM

## 2024-09-17 NOTE — Telephone Encounter (Signed)
 Called patient to let her know that her cardiologist-Dr. Hodges Fabry, PA-C) wanted her to continue taking her aspirin  81 MG throughout her procedure. Patient agreed and stated that she understood.

## 2024-09-20 ENCOUNTER — Ambulatory Visit: Attending: Medical

## 2024-09-20 DIAGNOSIS — R0602 Shortness of breath: Secondary | ICD-10-CM | POA: Diagnosis not present

## 2024-09-20 LAB — ECHOCARDIOGRAM COMPLETE
AR max vel: 2.31 cm2
AV Area VTI: 2.2 cm2
AV Area mean vel: 2.28 cm2
AV Mean grad: 3 mmHg
AV Peak grad: 6.8 mmHg
Ao pk vel: 1.3 m/s
Area-P 1/2: 4.8 cm2
S' Lateral: 3.26 cm

## 2024-09-21 ENCOUNTER — Ambulatory Visit: Payer: Self-pay | Admitting: Medical

## 2024-09-22 ENCOUNTER — Other Ambulatory Visit: Payer: Self-pay | Admitting: Internal Medicine

## 2024-09-22 DIAGNOSIS — I2510 Atherosclerotic heart disease of native coronary artery without angina pectoris: Secondary | ICD-10-CM

## 2024-09-24 ENCOUNTER — Emergency Department
Admission: EM | Admit: 2024-09-24 | Discharge: 2024-09-24 | Disposition: A | Discharge status: Home / Self Care | Attending: Emergency Medicine | Admitting: Emergency Medicine

## 2024-09-24 ENCOUNTER — Other Ambulatory Visit: Payer: Self-pay

## 2024-09-24 ENCOUNTER — Emergency Department

## 2024-09-24 DIAGNOSIS — F17200 Nicotine dependence, unspecified, uncomplicated: Secondary | ICD-10-CM | POA: Diagnosis not present

## 2024-09-24 DIAGNOSIS — I2510 Atherosclerotic heart disease of native coronary artery without angina pectoris: Secondary | ICD-10-CM | POA: Diagnosis not present

## 2024-09-24 DIAGNOSIS — Z7982 Long term (current) use of aspirin: Secondary | ICD-10-CM | POA: Diagnosis not present

## 2024-09-24 DIAGNOSIS — I10 Essential (primary) hypertension: Secondary | ICD-10-CM | POA: Diagnosis not present

## 2024-09-24 DIAGNOSIS — M7712 Lateral epicondylitis, left elbow: Secondary | ICD-10-CM | POA: Insufficient documentation

## 2024-09-24 DIAGNOSIS — Z951 Presence of aortocoronary bypass graft: Secondary | ICD-10-CM | POA: Diagnosis not present

## 2024-09-24 DIAGNOSIS — I110 Hypertensive heart disease with heart failure: Secondary | ICD-10-CM | POA: Insufficient documentation

## 2024-09-24 DIAGNOSIS — Z79899 Other long term (current) drug therapy: Secondary | ICD-10-CM | POA: Insufficient documentation

## 2024-09-24 DIAGNOSIS — J4489 Other specified chronic obstructive pulmonary disease: Secondary | ICD-10-CM | POA: Insufficient documentation

## 2024-09-24 DIAGNOSIS — E119 Type 2 diabetes mellitus without complications: Secondary | ICD-10-CM | POA: Insufficient documentation

## 2024-09-24 DIAGNOSIS — I509 Heart failure, unspecified: Secondary | ICD-10-CM | POA: Insufficient documentation

## 2024-09-24 DIAGNOSIS — M25522 Pain in left elbow: Secondary | ICD-10-CM | POA: Diagnosis present

## 2024-09-24 MED ORDER — IBUPROFEN 800 MG PO TABS: 800.0000 mg | ORAL_TABLET | Freq: Three times a day (TID) | ORAL | 0 refills | Status: AC | PRN

## 2024-09-24 MED ORDER — KETOROLAC TROMETHAMINE 30 MG/ML IJ SOLN
30.0000 mg | Freq: Once | INTRAMUSCULAR | Status: AC
  Administered 2024-09-24: 30 mg via INTRAMUSCULAR
  Filled 2024-09-24: qty 1

## 2024-09-24 MED ORDER — ONDANSETRON 4 MG PO TBDP: 4.0000 mg | ORAL_TABLET | Freq: Four times a day (QID) | ORAL | 0 refills | Status: DC | PRN

## 2024-09-24 MED ORDER — OXYCODONE-ACETAMINOPHEN 5-325 MG PO TABS
2.0000 | ORAL_TABLET | Freq: Once | ORAL | Status: AC
  Administered 2024-09-24: 2 via ORAL
  Filled 2024-09-24: qty 2

## 2024-09-24 MED ORDER — HYDROMORPHONE HCL 1 MG/ML IJ SOLN
1.0000 mg | Freq: Once | INTRAMUSCULAR | Status: AC
Start: 2024-09-24 — End: 2024-09-24
  Administered 2024-09-24: 1 mg via INTRAMUSCULAR
  Filled 2024-09-24: qty 1

## 2024-09-24 MED ORDER — LOSARTAN POTASSIUM 50 MG PO TABS
25.0000 mg | ORAL_TABLET | Freq: Once | ORAL | Status: AC
  Administered 2024-09-24: 25 mg via ORAL
  Filled 2024-09-24: qty 1

## 2024-09-24 MED ORDER — OXYCODONE HCL 10 MG PO TABS: 10.0000 mg | ORAL_TABLET | Freq: Three times a day (TID) | ORAL | 0 refills | Status: AC | PRN

## 2024-09-24 MED ORDER — ONDANSETRON 4 MG PO TBDP
4.0000 mg | ORAL_TABLET | Freq: Once | ORAL | Status: AC
  Administered 2024-09-24: 4 mg via ORAL
  Filled 2024-09-24: qty 1

## 2024-09-24 NOTE — ED Triage Notes (Addendum)
 Pt BIB [facility] for L shoulder pain. Pt with extensive medical hx, found to be hypertensive to 200s systolic. Pt had MRI Thursday. Pt denies injury or trauma to shoulder. Pt with hx of lung cancer, none at this time. Pt shoulder pain from shoulder head and radiates to elbow. Aox4. GCS 15.  Past Medical History:  Diagnosis Date   Abdominal aortic ectasia    a. 11/2017 CTA chest/abd/pelvis: 2.5 cm abd ao ectasia -->rec f/u u/s in 5 yrs.   Anemia    Angina at rest    Aortic atherosclerosis    Asthma    CAD (coronary artery disease)    a. 11/04/17 Cath: Native multivessel dzs-->CABG x 3 (LIMA->LAD, VG->OM2, VG->RPDA; b. 11/2017 MV: mid antlat/apical isch; c. 11/2017 Cath: LM 30d, LAD 32m, LCX 80p/m, OM1 90, OM2 80 (@ anastamosis of graft), RCA 80p, 144m, 90d, LIMA->LAD nl, VG->OM2 nl, VG->RPDA nl-->Med Rx.   Cancer [facility])    Carotid arterial disease    a. 10/2017 Carotid U/S: 1-30% bilat ICA stenosis.   CHF (congestive heart failure) (HCC)    COPD (chronic obstructive pulmonary disease) (HCC)    DDD (degenerative disc disease), cervical    Dyspnea    GERD (gastroesophageal reflux disease)    History of echocardiogram    a. 11/04/2017 Echo: EF of 65-70%, no RWMA, nl LV diastolic fxn, nl RV size/fxn, mild TR; b. 03/2018 Echo: EF 60-65%, mild LVH, no rwma, mild MR. Mildy reduced RV fxn. Mild TR.   Hyperlipidemia    Hypertension    Left renal artery stenosis    a. 11/2017 CTA Chest/Abd/Pelvis: 50-70% L RA stenosis.   Long-term use of aspirin  therapy    Noncompliance with medication treatment due to intermittent use of medication    NSTEMI (non-ST elevated myocardial infarction) (HCC) 10/2017   Palpitations    a. 02/2018 Event Monitor: wore for 2 days, 21 hrs - rare PAC's/PVC's. Avg HR 66 (42-103); b. 04/2018 Event Monitor: Wore for 14 days. RSR, 67 (49-111). No significant arrhythmias.   Persistent cough    Pulmonary HTN (HCC)    Pulmonary nodule, right    a. 10/2017 CT Chest: 5mm pulm nodule in R lung apex  - rec f/u w/ non-contrast chest CT in 1 year.   S/P CABG x 3 11/12/2017   a.) LIMA-LAD, SVG-OM2, SVG-RPDA   Sepsis (HCC)    Streptococcal pneumonia 12/19/2023   Thrombocytosis    Tobacco abuse    Type II diabetes mellitus (HCC)

## 2024-09-24 NOTE — ED Provider Notes (Signed)
 [facility] Provider Note    Event Date/Time   First MD Initiated Contact with Patient 09/24/24 0303     (approximate)   History   Shoulder Pain   HPI  Caitlyn Herrera is a 63 y.o. female with history of CAD status post CABG, hypertension, hyperlipidemia, diabetes, CHF, COPD who presents to the emergency department complaints of left elbow pain.  Symptoms ongoing for a month.  She has been complaining of midthoracic back pain and left shoulder pain.  Reports her PCP has obtained x-rays of her back and her orthopedist obtain an MRI of the shoulder and she is awaiting these results.  She continues to have pain in the left elbow though that radiates up into the shoulder.  She denies any injury.  Pain is worse with extension past 90 degrees.  She does not currently work.  No recent repetitive motions.  No redness or warmth to this area.  No fever.  She states this does not feel like her anginal equivalent and she is not having chest pain or shortness of breath.  She has taken hydrocodone , tizanidine, Mobic, naproxen , Lidoderm  patches, steroids without relief.  She states she was hoping to get an injection in the joint today.   History provided by patient, EMS.    Past Medical History:  Diagnosis Date   Abdominal aortic ectasia    a. 11/2017 CTA chest/abd/pelvis: 2.5 cm abd ao ectasia -->rec f/u u/s in 5 yrs.   Anemia    Angina at rest    Aortic atherosclerosis    Asthma    CAD (coronary artery disease)    a. 11/04/17 Cath: Native multivessel dzs-->CABG x 3 (LIMA->LAD, VG->OM2, VG->RPDA; b. 11/2017 MV: mid antlat/apical isch; c. 11/2017 Cath: LM 30d, LAD 61m, LCX 80p/m, OM1 90, OM2 80 (@ anastamosis of graft), RCA 80p, 121m, 90d, LIMA->LAD nl, VG->OM2 nl, VG->RPDA nl-->Med Rx.   Cancer [facility])    Carotid arterial disease    a. 10/2017 Carotid U/S: 1-30% bilat ICA stenosis.   CHF (congestive heart failure) (HCC)    COPD (chronic obstructive pulmonary disease) (HCC)     DDD (degenerative disc disease), cervical    Dyspnea    GERD (gastroesophageal reflux disease)    History of echocardiogram    a. 11/04/2017 Echo: EF of 65-70%, no RWMA, nl LV diastolic fxn, nl RV size/fxn, mild TR; b. 03/2018 Echo: EF 60-65%, mild LVH, no rwma, mild MR. Mildy reduced RV fxn. Mild TR.   Hyperlipidemia    Hypertension    Left renal artery stenosis    a. 11/2017 CTA Chest/Abd/Pelvis: 50-70% L RA stenosis.   Long-term use of aspirin  therapy    Noncompliance with medication treatment due to intermittent use of medication    NSTEMI (non-ST elevated myocardial infarction) (HCC) 10/2017   Palpitations    a. 02/2018 Event Monitor: wore for 2 days, 21 hrs - rare PAC's/PVC's. Avg HR 66 (42-103); b. 04/2018 Event Monitor: Wore for 14 days. RSR, 67 (49-111). No significant arrhythmias.   Persistent cough    Pulmonary HTN (HCC)    Pulmonary nodule, right    a. 10/2017 CT Chest: 5mm pulm nodule in R lung apex - rec f/u w/ non-contrast chest CT in 1 year.   S/P CABG x 3 11/12/2017   a.) LIMA-LAD, SVG-OM2, SVG-RPDA   Sepsis (HCC)    Streptococcal pneumonia 12/19/2023   Thrombocytosis    Tobacco abuse    Type II diabetes mellitus (HCC)  Past Surgical History:  Procedure Laterality Date   BREAST BIOPSY Right 06/23/2018   US  guided biopsy - pathology benign   BREAST CYST EXCISION Right    CORONARY ARTERY BYPASS GRAFT N/A 11/12/2017   Procedure: CORONARY ARTERY BYPASS GRAFTING (CABG) x Three , using left internal mammary artery and right leg greater saphenous vein harvested endoscopically;  Surgeon: Fleeta Hanford Coy, MD;  Location: [facility];  Service: Open Heart Surgery;  Laterality: N/A;   DILATION AND CURETTAGE OF UTERUS     FLEXIBLE BRONCHOSCOPY Left 03/29/2024   Procedure: BRONCHOSCOPY, FLEXIBLE;  Surgeon: Tamea Dedra CROME, MD;  Location: [facility] ORS;  Service: Pulmonary;  Laterality: Left;   LAPAROSCOPIC CHOLECYSTECTOMY     LEFT HEART CATH AND CORONARY ANGIOGRAPHY N/A 11/04/2017    Procedure: LEFT HEART CATH AND CORONARY ANGIOGRAPHY;  Surgeon: Mady Bruckner, MD;  Location: [facility] INVASIVE CV LAB;  Service: Cardiovascular;  Laterality: N/A;   LEFT HEART CATH AND CORS/GRAFTS ANGIOGRAPHY N/A 12/09/2017   Procedure: LEFT HEART CATH AND CORS/GRAFTS ANGIOGRAPHY;  Surgeon: Jordan, Peter M, MD;  Location: [facility] INVASIVE CV LAB;  Service: Cardiovascular;  Laterality: N/A;   RIGHT/LEFT HEART CATH AND CORONARY ANGIOGRAPHY Bilateral 10/08/2022   Procedure: RIGHT/LEFT HEART CATH AND CORONARY ANGIOGRAPHY;  Surgeon: Mady Bruckner, MD;  Location: [facility] INVASIVE CV LAB;  Service: Cardiovascular;  Laterality: Bilateral;   TEE WITHOUT CARDIOVERSION N/A 11/12/2017   Procedure: TRANSESOPHAGEAL ECHOCARDIOGRAM (TEE);  Surgeon: Fleeta Hanford, Coy, MD;  Location: [facility] OR;  Service: Open Heart Surgery;  Laterality: N/A;   VIDEO BRONCHOSCOPY WITH ENDOBRONCHIAL ULTRASOUND Left 03/29/2024   Procedure: BRONCHOSCOPY, WITH EBUS;  Surgeon: Tamea Dedra CROME, MD;  Location: [facility] ORS;  Service: Pulmonary;  Laterality: Left;    MEDICATIONS:  Prior to Admission medications   Medication Sig Start Date End Date Taking? Authorizing Provider  albuterol  (VENTOLIN  HFA) 108 (90 Base) MCG/ACT inhaler Inhale 2 puffs into the lungs every 6 (six) hours as needed for wheezing or shortness of breath. 03/16/24   Tamea Dedra CROME, MD  aspirin  EC 81 MG tablet Take 1 tablet (81 mg total) by mouth daily. 03/26/18   End, Bruckner, MD  atorvastatin  (LIPITOR ) 80 MG tablet TAKE 1 TABLET BY MOUTH DAILY 08/27/24   End, Bruckner, MD  azelastine (ASTELIN) 0.1 % nasal spray Place 2 sprays into both nostrils as needed. 11/05/18   [provider]  benzonatate  (TESSALON ) 100 MG capsule Take 1 capsule (100 mg total) by mouth 2 (two) times daily as needed for cough. 03/18/23   Tamea Dedra CROME, MD  carvedilol  (COREG ) 12.5 MG tablet TAKE 1 TABLET BY MOUTH TWICE DAILY *PATIENT NEEDS APPOINTMENT* 03/30/24   Furth, Cadence H, PA-C   cetirizine (ZYRTEC) 10 MG tablet Take 10 mg by mouth every morning.    [provider]  citalopram  (CELEXA ) 40 MG tablet Take 40 mg by mouth every morning. 12/16/22   [provider]  cyclobenzaprine (FLEXERIL) 10 MG tablet Take 10 mg by mouth 3 (three) times daily as needed. 11/03/18   [provider]  ezetimibe  (ZETIA ) 10 MG tablet TAKE 1 TABLET BY MOUTH DAILY 08/27/24   End, Bruckner, MD  fluticasone  [facility]) 50 MCG/ACT nasal spray 2 sprays as needed. 01/06/19   [provider]  Fluticasone -Umeclidin-Vilant (TRELEGY ELLIPTA ) 200-62.5-25 MCG/ACT AEPB Inhale 1 puff into the lungs daily. 03/16/24   Tamea Dedra CROME, MD  gabapentin  (NEURONTIN ) 300 MG capsule Take 300 mg by mouth every morning. Has not started. 03/03/23   [provider]  Iron -Vitamin  C 65-125 MG TABS Take 1 tablet by mouth daily. 06/08/24   Babara Call, MD  isosorbide  mononitrate (IMDUR ) 120 MG 24 hr tablet Take 120 mg by mouth every morning. 04/26/24   [provider]  lidocaine  (LIDODERM ) 5 % Place 1 patch onto the skin every 12 (twelve) hours. Remove & Discard patch within 12 hours or as directed by MD 08/06/24 08/06/25  Claudene Rover, MD  losartan  (COZAAR ) 25 MG tablet Take 0.5 tablets (12.5 mg total) by mouth daily. 02/04/24   Furth, Cadence H, PA-C  meloxicam (MOBIC) 15 MG tablet Take 15 mg by mouth daily as needed. 10/02/22   [provider]  methocarbamol  (ROBAXIN ) 500 MG tablet Take 1 tablet (500 mg total) by mouth every 8 (eight) hours as needed for muscle spasms. 09/06/24   Arlander Charleston, MD  methylPREDNISolone  (MEDROL  DOSEPAK) 4 MG TBPK tablet Take 6 tablets x 1 day, then take 5 tablets x 1 day, then take 4 tablets x 1 day, then take 3 tablets x 1 day, then 2 tablets x 1 day, then 1 tablet, then stop. Patient not taking: Reported on 08/03/2024 05/11/24   Borders, Fonda SAUNDERS, NP  naproxen  (NAPROSYN ) 500 MG tablet Take 1 tablet (500 mg total) by mouth 2 (two) times daily  with a meal. 09/06/24   Arlander Charleston, MD  nitroGLYCERIN  (NITROSTAT ) 0.4 MG SL tablet DISSOLVE 1 TABLET UNDER THE TONGUE AS NEEDED FOR CHEST PAIN EVERY 5 MINUTES UP TO 3 TIMES. IF NO RELIEF CALL 911. 07/30/23   Vivienne Lonni Ingle, NP  omeprazole  (PRILOSEC) 40 MG capsule TAKE 1 CAPSULE BY MOUTH EVERY DAY BEFORE BREAKFAST 03/29/19   Vanga, Rohini Reddy, MD  ondansetron  (ZOFRAN ) 4 MG tablet Take 1 tablet (4 mg total) by mouth every 8 (eight) hours as needed for nausea or vomiting. 04/07/24   Babara Call, MD  SitaGLIPtin-MetFORMIN  HCl (JANUMET XR) 50-1000 MG TB24 Take 1 tablet by mouth every morning.    [provider]  tiZANidine (ZANAFLEX) 2 MG tablet Take 2 mg by mouth 3 (three) times daily. 10/02/22   [provider]  traMADol  (ULTRAM ) 50 MG tablet Take 1 tablet (50 mg total) by mouth every 6 (six) hours as needed. 04/07/24   Babara Call, MD    Physical Exam   Triage Vital Signs: ED Triage Vitals  Encounter Vitals Group     BP 09/24/24 0304 (!) 158/141     Girls Systolic BP Percentile --      Girls Diastolic BP Percentile --      Boys Systolic BP Percentile --      Boys Diastolic BP Percentile --      Pulse Rate 09/24/24 0304 61     Resp 09/24/24 0304 16     Temp 09/24/24 0304 97.6 F (36.4 C)     Temp Source 09/24/24 0304 Oral     SpO2 09/24/24 0304 100 %     Weight --      Height --      Head Circumference --      Peak Flow --      Pain Score 09/24/24 0257 10     Pain Loc --      Pain Education --      Exclude from Growth Chart --      Most recent vital signs: Vitals:   09/24/24 0304 09/24/24 0351  BP: (!) 158/141 (!) 197/98  Pulse: 61   Resp: 16   Temp: 97.6 F (36.4 C)  SpO2: 100%      CONSTITUTIONAL: Alert and responds appropriately to questions.  Appears very uncomfortable HEAD: Normocephalic, atraumatic EYES: Conjunctivae clear, pupils appear equal ENT: normal nose; moist mucous membranes NECK: Normal range of motion CARD: Regular rate and  rhythm RESP: Normal chest excursion without splinting or tachypnea; no hypoxia or respiratory distress, speaking full sentences ABD/GI: non-distended EXT: Patient is quite tender over the lateral epicondyle to the left elbow.  There is no soft tissue swelling, redness, warmth.  She is a 2+ left radial pulse.  No pain over the left shoulder or left wrist.  Compartments of the left arm are soft.  Normal capillary refill.  She has decreased range of motion of the left elbow due to pain and likes to keep her elbow at 90 degrees.  Pain is worse with full extension.  She has some decreased range of motion as well in the left shoulder but states it is because it causes pain in her elbow. SKIN: Normal color for age and race, no rashes on exposed skin NEURO: Moves all extremities equally, normal speech, no facial asymmetry noted PSYCH: The patient's mood and manner are appropriate. Grooming and personal hygiene are appropriate.  ED Results / Procedures / Treatments   LABS: (all labs ordered are listed, but only abnormal results are displayed) Labs Reviewed - No data to display   EKG:    Date: 09/24/2024  Rate: 64  Rhythm: normal sinus rhythm  QRS Axis: normal  Intervals: normal  ST/T Wave abnormalities: normal  Conduction Disutrbances: none  Narrative Interpretation: unremarkable, nonspecific T wave changes in lateral leads, no change compared to prior EKG     RADIOLOGY: My personal review and interpretation of imaging: X-ray shows no acute abnormality.  I have personally reviewed all radiology reports. DG Elbow Complete Left Result Date: 09/24/2024 EXAM: 3 VIEW(S) XRAY OF THE LEFT ELBOW COMPARISON: 02/29/2024 CLINICAL HISTORY: elbow pain for 1 month without injury. FINDINGS: BONES AND JOINTS: No acute fracture or dislocation is noted. Mild osteophytic spur is noted along the olecranon process. No joint effusion is seen. No focal osseous lesion. SOFT TISSUES: The soft tissues are  unremarkable. IMPRESSION: 1. No acute fracture or dislocation. 2. Mild olecranon osteophyte, compatible with mild degenerative change. Electronically signed by: Oneil Devonshire MD 09/24/2024 03:32 AM EST RP Workstation: [id number]     PROCEDURES:     Procedures    IMPRESSION / MDM / ASSESSMENT AND PLAN / ED COURSE  I reviewed the triage vital signs and the nursing notes.   Patient here with left elbow pain.    DIFFERENTIAL DIAGNOSIS (includes but not limited to):   Lateral epicondylitis, tendinitis, less likely pathologic fracture, no signs of arterial obstruction, DVT, gout, septic arthritis, low suspicion that this is her anginal equivalent  Patient's presentation is most consistent with acute complicated illness / injury requiring diagnostic workup.  PLAN: Will obtain x-rays of the left elbow and give pain medication here.  Will obtain EKG although I do not think this is her anginal equivalent.  She is quite hypertensive but I suspect this is due to pain.  Will recheck once pain has improved.  Discussed with patient that intra-articular injections for pain control would be managed as an outpatient with her orthopedist.  She expressed frustration over this.  On review of her records it does appear she has been seen previously in our emergency department for similar symptoms in May 2025.   MEDICATIONS GIVEN IN ED: Medications  oxyCODONE -acetaminophen  (PERCOCET/ROXICET)  5-325 MG per tablet 2 tablet (has no administration in time range)  losartan  (COZAAR ) tablet 25 mg (has no administration in time range)  HYDROmorphone (DILAUDID) injection 1 mg (1 mg Intramuscular Given 09/24/24 0315)  ketorolac  (TORADOL ) 30 MG/ML injection 30 mg (30 mg Intramuscular Given 09/24/24 0314)  ondansetron  (ZOFRAN -ODT) disintegrating tablet 4 mg (4 mg Oral Given 09/24/24 9687)     ED COURSE: X-ray reviewed and interpreted by myself and the radiologist and shows no acute abnormality.  Pain has improved  but patient is still hypertensive.  Will give additional pain medication and an extra dose of her losartan .  She is on losartan , Imdur  and carvedilol .  She reports compliance with these medications.  He denies any headache, vision changes, chest pain or shortness of breath, numbness or focal weakness.  Recommended close follow-up with her primary care doctor for this.   Will discharge with additional pain medication here and will have her follow-up with her orthopedist for what I suspect is lateral epicondylitis.    At this time, I do not feel there is any life-threatening condition present. I reviewed all nursing notes, vitals, pertinent previous records.  All lab and urine results, EKGs, imaging ordered have been independently reviewed and interpreted by myself.  I reviewed all available radiology reports from any imaging ordered this visit.  Based on my assessment, I feel the patient is safe to be discharged home without further emergent workup and can continue workup as an outpatient as needed. Discussed all findings, treatment plan as well as usual and customary return precautions.  They verbalize understanding and are comfortable with this plan.  Outpatient follow-up has been provided as needed.  All questions have been answered.    CONSULTS:  none   OUTSIDE RECORDS REVIEWED: Reviewed recent cardiology and oncology notes.     FINAL CLINICAL IMPRESSION(S) / ED DIAGNOSES   Final diagnoses:  Left lateral epicondylitis  Hypertension, unspecified type     Rx / DC Orders   ED Discharge Orders          Ordered    oxyCODONE  10 MG TABS  Every 8 hours PRN        09/24/24 0354    ibuprofen  (ADVIL ) 800 MG tablet  Every 8 hours PRN        09/24/24 0354    ondansetron  (ZOFRAN -ODT) 4 MG disintegrating tablet  Every 6 hours PRN        09/24/24 0354             Note:  This document was prepared using Dragon voice recognition software and may include unintentional dictation  errors.   Marquerite Forsman, Josette SAILOR, DO 09/24/24 [phone]

## 2024-09-24 NOTE — Discharge Instructions (Addendum)
 You may alternate over the counter Tylenol 1000 mg every 6 hours as needed for pain, fever and Ibuprofen 800 mg every 6-8 hours as needed for pain, fever.  Please take Ibuprofen with food.  Do not take more than 4000 mg of Tylenol (acetaminophen) in a 24 hour period.  You are being provided a prescription for opiates (also known as narcotics) for pain control.  Opiates can be addictive and should only be used when absolutely necessary for pain control when other alternatives do not work.  We recommend you only use them for the recommended amount of time and only as prescribed.  Please do not take with other sedative medications or alcohol.  Please do not drive, operate machinery, make important decisions while taking opiates.  Please note that these medications can be addictive and have high abuse potential.  Patients can become addicted to narcotics after only taking them for a few days.  Please keep these medications locked away from children, teenagers or any family members with history of substance abuse.  Narcotic pain medicine may also make you constipated.  You may use over-the-counter medications such as MiraLAX, Colace to prevent constipation.  If you become constipated, you may use over-the-counter enemas as needed.  Itching and nausea are also common side effects of narcotic pain medication.  If you develop uncontrolled vomiting or a rash, please stop these medications and seek medical care.

## 2024-09-30 ENCOUNTER — Ambulatory Visit
Admission: RE | Admit: 2024-09-30 | Discharge: 2024-09-30 | Disposition: A | Source: Ambulatory Visit | Attending: Radiation Oncology | Admitting: Radiation Oncology

## 2024-09-30 DIAGNOSIS — C3492 Malignant neoplasm of unspecified part of left bronchus or lung: Secondary | ICD-10-CM | POA: Insufficient documentation

## 2024-09-30 MED ORDER — IOHEXOL 300 MG/ML  SOLN
100.0000 mL | Freq: Once | INTRAMUSCULAR | Status: AC | PRN
  Administered 2024-09-30: 75 mL via INTRAVENOUS

## 2024-10-04 ENCOUNTER — Other Ambulatory Visit: Payer: Self-pay | Admitting: Internal Medicine

## 2024-10-04 DIAGNOSIS — Z13820 Encounter for screening for osteoporosis: Secondary | ICD-10-CM

## 2024-10-08 ENCOUNTER — Inpatient Hospital Stay: Attending: Oncology

## 2024-10-08 DIAGNOSIS — C3492 Malignant neoplasm of unspecified part of left bronchus or lung: Secondary | ICD-10-CM

## 2024-10-08 DIAGNOSIS — Z87891 Personal history of nicotine dependence: Secondary | ICD-10-CM | POA: Insufficient documentation

## 2024-10-08 DIAGNOSIS — D509 Iron deficiency anemia, unspecified: Secondary | ICD-10-CM | POA: Diagnosis not present

## 2024-10-08 DIAGNOSIS — C3432 Malignant neoplasm of lower lobe, left bronchus or lung: Secondary | ICD-10-CM | POA: Diagnosis present

## 2024-10-08 LAB — CBC WITH DIFFERENTIAL (CANCER CENTER ONLY)
Abs Immature Granulocytes: 0.03 K/uL (ref 0.00–0.07)
Basophils Absolute: 0.1 K/uL (ref 0.0–0.1)
Basophils Relative: 1 %
Eosinophils Absolute: 0.3 K/uL (ref 0.0–0.5)
Eosinophils Relative: 6 %
HCT: 33.7 % — ABNORMAL LOW (ref 36.0–46.0)
Hemoglobin: 11 g/dL — ABNORMAL LOW (ref 12.0–15.0)
Immature Granulocytes: 1 %
Lymphocytes Relative: 27 %
Lymphs Abs: 1.4 K/uL (ref 0.7–4.0)
MCH: 29 pg (ref 26.0–34.0)
MCHC: 32.6 g/dL (ref 30.0–36.0)
MCV: 88.9 fL (ref 80.0–100.0)
Monocytes Absolute: 0.7 K/uL (ref 0.1–1.0)
Monocytes Relative: 14 %
Neutro Abs: 2.7 K/uL (ref 1.7–7.7)
Neutrophils Relative %: 51 %
Platelet Count: 379 K/uL (ref 150–400)
RBC: 3.79 MIL/uL — ABNORMAL LOW (ref 3.87–5.11)
RDW: 15.4 % (ref 11.5–15.5)
Smear Review: NORMAL
WBC Count: 5.2 K/uL (ref 4.0–10.5)
nRBC: 0 % (ref 0.0–0.2)

## 2024-10-08 LAB — CMP (CANCER CENTER ONLY)
ALT: <5 U/L (ref 0–44)
AST: 16 U/L (ref 15–41)
Albumin: 3.7 g/dL (ref 3.5–5.0)
Alkaline Phosphatase: 87 U/L (ref 38–126)
Anion gap: 11 (ref 5–15)
BUN: 17 mg/dL (ref 8–23)
CO2: 25 mmol/L (ref 22–32)
Calcium: 9.4 mg/dL (ref 8.9–10.3)
Chloride: 105 mmol/L (ref 98–111)
Creatinine: 0.95 mg/dL (ref 0.44–1.00)
GFR, Estimated: >60 mL/min (ref >60)
Glucose, Bld: 79 mg/dL (ref 70–99)
Potassium: 3.6 mmol/L (ref 3.5–5.1)
Sodium: 141 mmol/L (ref 135–145)
Total Bilirubin: 0.2 mg/dL (ref 0.0–1.2)
Total Protein: 6.8 g/dL (ref 6.5–8.1)

## 2024-10-08 LAB — IRON AND TIBC
Iron: 48 ug/dL (ref 28–170)
Saturation Ratios: 11 % (ref 10.4–31.8)
TIBC: 435 ug/dL (ref 250–450)
UIBC: 387 ug/dL

## 2024-10-08 LAB — RETIC PANEL
Immature Retic Fract: 12.1 % (ref 2.3–15.9)
RBC.: 3.76 MIL/uL — ABNORMAL LOW (ref 3.87–5.11)
Retic Count, Absolute: 50 K/uL (ref 19.0–186.0)
Retic Ct Pct: 1.3 % (ref 0.4–3.1)
Reticulocyte Hemoglobin: 31.2 pg (ref >27.9)

## 2024-10-08 LAB — FERRITIN: Ferritin: 43 ng/mL (ref 11–307)

## 2024-10-12 ENCOUNTER — Inpatient Hospital Stay: Admitting: Oncology

## 2024-10-12 NOTE — Assessment & Plan Note (Deleted)
 Imaging findings and pathology results were reviewed and discussed with patient. Clinically she has at least Stage IB squamous cell [place] of the lung.  PET scan showed no hypermetabolic lymphadenopathy.  Biopsy of station 7 was negative.  Discussed with pulmonology, intra bronchoscopy impression of station 7 was suspicious. Patient declined surgery evaluation. S/p radiation.  Overall she tolerates well.

## 2024-10-14 ENCOUNTER — Ambulatory Visit
Admission: RE | Admit: 2024-10-14 | Discharge: 2024-10-14 | Disposition: A | Discharge status: Home / Self Care | Source: Ambulatory Visit | Attending: Radiation Oncology | Admitting: Radiation Oncology

## 2024-10-14 ENCOUNTER — Encounter: Payer: Self-pay | Admitting: Radiation Oncology

## 2024-10-14 VITALS — BP 196/75 | HR 61 | Temp 97.0°F | Resp 16 | Ht 66.0 in | Wt 152.0 lb

## 2024-10-14 DIAGNOSIS — C7951 Secondary malignant neoplasm of bone: Secondary | ICD-10-CM | POA: Insufficient documentation

## 2024-10-14 DIAGNOSIS — Z923 Personal history of irradiation: Secondary | ICD-10-CM | POA: Diagnosis not present

## 2024-10-14 DIAGNOSIS — C3432 Malignant neoplasm of lower lobe, left bronchus or lung: Secondary | ICD-10-CM | POA: Diagnosis not present

## 2024-10-14 DIAGNOSIS — C3492 Malignant neoplasm of unspecified part of left bronchus or lung: Secondary | ICD-10-CM

## 2024-10-14 NOTE — Progress Notes (Signed)
 Radiation Oncology Follow up Note  Name: Caitlyn Herrera   Date:   10/14/2024 MRN:  [id number] DOB: February 18, 1961    This 63 y.o. 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  REFERRING PROVIDER: [facility]  HPI: Patient is a 63 year old female now out 4 months having completed.  Radiation therapy to her left lung for stage Ib squamous cell carcinoma.  Seen today in routine follow-up she is doing well.  She specifically denies exacerbation of cough hemoptysis chest tightness or dysphagia.  She had a recent CT scan of her chest showing reaeration of the left lung which was collapsed on prior exam.  There are a few irregular airspace opacities in the left lower lobe no mass or consolidation pleural effusion noted.  I have reviewed those films and I believe these are  changes secondary to radiation.  COMPLICATIONS OF TREATMENT: none  FOLLOW UP COMPLIANCE: keeps appointments   PHYSICAL EXAM:  There were no vitals taken for this visit. Well-developed well-nourished patient in NAD. HEENT reveals PERLA, EOMI, discs not visualized.  Oral cavity is clear. No oral mucosal lesions are identified. Neck is clear without evidence of cervical or supraclavicular adenopathy. Lungs are clear to A&P. Cardiac examination is essentially unremarkable with regular rate and rhythm without murmur rub or thrill. Abdomen is benign with no organomegaly or masses noted. Motor sensory and DTR levels are equal and symmetric in the upper and lower extremities. Cranial nerves II through XII are grossly intact. Proprioception is intact. No peripheral adenopathy or edema is identified. No motor or sensory levels are noted. Crude visual fields are within normal range.  RADIOLOGY RESULTS: CT scan reviewed CT scan ordered in 6 months of her chest  PLAN: Present time patient is doing  well.  I have reviewed her imaging studies and believe her changes are secondary to prior radiation.  She has had excellent aeration of the lung which was collapsed.  I am pleased with her overall progress.  Of asked to see her back in 4 months with a repeat CT scan of the chest.  Out of an abundance of caution I have moved up her usual 46-month follow-up to 4 months with repeat CT scan of the chest.  Patient is also missed an appointment with medical oncology and I have rescheduled that with Dr. Babara.  Patient comprehends my recommendations well.  Patient knows to call in the meantime with any concerns.  I would like to take this opportunity to thank you for allowing me to participate in the care of your patient.SABRA Marcey Penton, MD

## 2024-11-01 ENCOUNTER — Encounter

## 2024-11-03 ENCOUNTER — Telehealth: Payer: Self-pay

## 2024-11-03 ENCOUNTER — Ambulatory Visit: Admission: RE | Admit: 2024-11-03 | Source: Home / Self Care | Admitting: General Surgery

## 2024-11-03 NOTE — Telephone Encounter (Signed)
 Received message from Trish in Endo stating that patient said she lvm requesting to cancel her colonoscopy with Dr. Marinda that was scheduled for today.  LVM after receiving message for patient to call office back to reschedule.  Message routed to [place], [place] and [place] to make them both aware of pending reschedule.  I've asked Trish to keep in the depot.  Thanks,  [place], CMA

## 2024-11-16 ENCOUNTER — Other Ambulatory Visit

## 2024-11-26 ENCOUNTER — Emergency Department

## 2024-11-26 ENCOUNTER — Other Ambulatory Visit: Payer: Self-pay

## 2024-11-26 ENCOUNTER — Inpatient Hospital Stay: Admission: EM | Admit: 2024-11-26 | Discharge: 2024-11-30 | DRG: 871 | Disposition: A

## 2024-11-26 DIAGNOSIS — E871 Hypo-osmolality and hyponatremia: Secondary | ICD-10-CM | POA: Diagnosis present

## 2024-11-26 DIAGNOSIS — R6520 Severe sepsis without septic shock: Secondary | ICD-10-CM | POA: Diagnosis not present

## 2024-11-26 DIAGNOSIS — Z7982 Long term (current) use of aspirin: Secondary | ICD-10-CM | POA: Diagnosis not present

## 2024-11-26 DIAGNOSIS — A419 Sepsis, unspecified organism: Secondary | ICD-10-CM | POA: Diagnosis present

## 2024-11-26 DIAGNOSIS — M5030 Other cervical disc degeneration, unspecified cervical region: Secondary | ICD-10-CM | POA: Diagnosis present

## 2024-11-26 DIAGNOSIS — I701 Atherosclerosis of renal artery: Secondary | ICD-10-CM | POA: Diagnosis present

## 2024-11-26 DIAGNOSIS — J441 Chronic obstructive pulmonary disease with (acute) exacerbation: Secondary | ICD-10-CM | POA: Diagnosis present

## 2024-11-26 DIAGNOSIS — Z85118 Personal history of other malignant neoplasm of bronchus and lung: Secondary | ICD-10-CM

## 2024-11-26 DIAGNOSIS — Z87891 Personal history of nicotine dependence: Secondary | ICD-10-CM | POA: Diagnosis not present

## 2024-11-26 DIAGNOSIS — I110 Hypertensive heart disease with heart failure: Secondary | ICD-10-CM | POA: Diagnosis present

## 2024-11-26 DIAGNOSIS — Z8249 Family history of ischemic heart disease and other diseases of the circulatory system: Secondary | ICD-10-CM

## 2024-11-26 DIAGNOSIS — Z85830 Personal history of malignant neoplasm of bone: Secondary | ICD-10-CM

## 2024-11-26 DIAGNOSIS — Y95 Nosocomial condition: Secondary | ICD-10-CM | POA: Diagnosis present

## 2024-11-26 DIAGNOSIS — R197 Diarrhea, unspecified: Secondary | ICD-10-CM | POA: Diagnosis present

## 2024-11-26 DIAGNOSIS — R112 Nausea with vomiting, unspecified: Secondary | ICD-10-CM

## 2024-11-26 DIAGNOSIS — D509 Iron deficiency anemia, unspecified: Secondary | ICD-10-CM | POA: Diagnosis present

## 2024-11-26 DIAGNOSIS — Z951 Presence of aortocoronary bypass graft: Secondary | ICD-10-CM | POA: Diagnosis not present

## 2024-11-26 DIAGNOSIS — J189 Pneumonia, unspecified organism: Secondary | ICD-10-CM | POA: Diagnosis present

## 2024-11-26 DIAGNOSIS — I252 Old myocardial infarction: Secondary | ICD-10-CM | POA: Diagnosis not present

## 2024-11-26 DIAGNOSIS — E785 Hyperlipidemia, unspecified: Secondary | ICD-10-CM | POA: Diagnosis present

## 2024-11-26 DIAGNOSIS — J9601 Acute respiratory failure with hypoxia: Secondary | ICD-10-CM | POA: Diagnosis not present

## 2024-11-26 DIAGNOSIS — I7143 Infrarenal abdominal aortic aneurysm, without rupture: Secondary | ICD-10-CM | POA: Diagnosis present

## 2024-11-26 DIAGNOSIS — Z79899 Other long term (current) drug therapy: Secondary | ICD-10-CM

## 2024-11-26 DIAGNOSIS — J449 Chronic obstructive pulmonary disease, unspecified: Secondary | ICD-10-CM | POA: Diagnosis present

## 2024-11-26 DIAGNOSIS — I2510 Atherosclerotic heart disease of native coronary artery without angina pectoris: Secondary | ICD-10-CM | POA: Diagnosis present

## 2024-11-26 DIAGNOSIS — C3492 Malignant neoplasm of unspecified part of left bronchus or lung: Secondary | ICD-10-CM | POA: Diagnosis present

## 2024-11-26 DIAGNOSIS — C3490 Malignant neoplasm of unspecified part of unspecified bronchus or lung: Secondary | ICD-10-CM | POA: Diagnosis present

## 2024-11-26 DIAGNOSIS — E119 Type 2 diabetes mellitus without complications: Secondary | ICD-10-CM | POA: Diagnosis present

## 2024-11-26 DIAGNOSIS — I5032 Chronic diastolic (congestive) heart failure: Secondary | ICD-10-CM | POA: Diagnosis present

## 2024-11-26 DIAGNOSIS — Z7984 Long term (current) use of oral hypoglycemic drugs: Secondary | ICD-10-CM

## 2024-11-26 DIAGNOSIS — I10 Essential (primary) hypertension: Secondary | ICD-10-CM | POA: Diagnosis present

## 2024-11-26 LAB — HEPATIC FUNCTION PANEL
ALT: 10 U/L (ref 0–44)
AST: 22 U/L (ref 15–41)
Albumin: 3.8 g/dL (ref 3.5–5.0)
Alkaline Phosphatase: 120 U/L (ref 38–126)
Bilirubin, Direct: 0.6 mg/dL — ABNORMAL HIGH (ref 0.0–0.2)
Indirect Bilirubin: 0.6 mg/dL (ref 0.3–0.9)
Total Bilirubin: 1.2 mg/dL (ref 0.0–1.2)
Total Protein: 7.6 g/dL (ref 6.5–8.1)

## 2024-11-26 LAB — RESP PANEL BY RT-PCR (RSV, FLU A&B, COVID)  RVPGX2
Influenza A by PCR: NEGATIVE
Influenza B by PCR: NEGATIVE
Resp Syncytial Virus by PCR: NEGATIVE
SARS Coronavirus 2 by RT PCR: NEGATIVE

## 2024-11-26 LAB — CBC
HCT: 35.5 % — ABNORMAL LOW (ref 36.0–46.0)
Hemoglobin: 11.7 g/dL — ABNORMAL LOW (ref 12.0–15.0)
MCH: 29.5 pg (ref 26.0–34.0)
MCHC: 33 g/dL (ref 30.0–36.0)
MCV: 89.4 fL (ref 80.0–100.0)
Platelets: 414 10*3/uL — ABNORMAL HIGH (ref 150–400)
RBC: 3.97 MIL/uL (ref 3.87–5.11)
RDW: 15 % (ref 11.5–15.5)
WBC: 20.6 10*3/uL — ABNORMAL HIGH (ref 4.0–10.5)
nRBC: 0 % (ref 0.0–0.2)

## 2024-11-26 LAB — BASIC METABOLIC PANEL WITH GFR
Anion gap: 11 (ref 5–15)
BUN: 12 mg/dL (ref 8–23)
CO2: 25 mmol/L (ref 22–32)
Calcium: 9.5 mg/dL (ref 8.9–10.3)
Chloride: 97 mmol/L — ABNORMAL LOW (ref 98–111)
Creatinine, Ser: 0.97 mg/dL (ref 0.44–1.00)
GFR, Estimated: >60 mL/min
Glucose, Bld: 163 mg/dL — ABNORMAL HIGH (ref 70–99)
Potassium: 3.8 mmol/L (ref 3.5–5.1)
Sodium: 133 mmol/L — ABNORMAL LOW (ref 135–145)

## 2024-11-26 LAB — PROCALCITONIN: Procalcitonin: 0.31 ng/mL

## 2024-11-26 LAB — LIPASE, BLOOD: Lipase: 33 U/L (ref 11–51)

## 2024-11-26 LAB — LACTIC ACID, PLASMA: Lactic Acid, Venous: 1.6 mmol/L (ref 0.5–1.9)

## 2024-11-26 MED ORDER — ENOXAPARIN SODIUM 40 MG/0.4ML IJ SOSY
40.0000 mg | PREFILLED_SYRINGE | INTRAMUSCULAR | Status: DC
  Administered 2024-11-26 – 2024-11-29 (×4): 40 mg via SUBCUTANEOUS
  Filled 2024-11-26 (×4): qty 0.4

## 2024-11-26 MED ORDER — ONDANSETRON HCL 4 MG/2ML IJ SOLN
4.0000 mg | Freq: Four times a day (QID) | INTRAMUSCULAR | Status: DC | PRN
  Administered 2024-11-26 – 2024-11-27 (×2): 4 mg via INTRAVENOUS
  Filled 2024-11-26 (×2): qty 2

## 2024-11-26 MED ORDER — LACTATED RINGERS IV SOLN: INTRAVENOUS | Status: DC

## 2024-11-26 MED ORDER — IPRATROPIUM-ALBUTEROL 0.5-2.5 (3) MG/3ML IN SOLN
6.0000 mL | Freq: Once | RESPIRATORY_TRACT | Status: AC
  Administered 2024-11-26: 6 mL via RESPIRATORY_TRACT
  Filled 2024-11-26: qty 6

## 2024-11-26 MED ORDER — ACETAMINOPHEN 325 MG PO TABS: 650.0000 mg | ORAL_TABLET | Freq: Four times a day (QID) | ORAL | Status: DC | PRN

## 2024-11-26 MED ORDER — IOHEXOL 300 MG/ML  SOLN
100.0000 mL | Freq: Once | INTRAMUSCULAR | Status: AC | PRN
  Administered 2024-11-26: 100 mL via INTRAVENOUS

## 2024-11-26 MED ORDER — PREDNISONE 20 MG PO TABS
40.0000 mg | ORAL_TABLET | Freq: Every day | ORAL | Status: DC
  Administered 2024-11-27: 40 mg via ORAL
  Filled 2024-11-26 (×2): qty 2

## 2024-11-26 MED ORDER — BUDESONIDE 0.5 MG/2ML IN SUSP
2.0000 mg | Freq: Two times a day (BID) | RESPIRATORY_TRACT | Status: DC
  Administered 2024-11-27 – 2024-11-29 (×6): 2 mg via RESPIRATORY_TRACT
  Filled 2024-11-26 (×5): qty 8

## 2024-11-26 MED ORDER — IPRATROPIUM-ALBUTEROL 0.5-2.5 (3) MG/3ML IN SOLN
3.0000 mL | Freq: Four times a day (QID) | RESPIRATORY_TRACT | Status: DC
  Administered 2024-11-27 – 2024-11-28 (×8): 3 mL via RESPIRATORY_TRACT
  Filled 2024-11-26 (×8): qty 3

## 2024-11-26 MED ORDER — ONDANSETRON HCL 4 MG PO TABS: 4.0000 mg | ORAL_TABLET | Freq: Four times a day (QID) | ORAL | Status: DC | PRN

## 2024-11-26 MED ORDER — SODIUM CHLORIDE 0.9 % IV SOLN
2.0000 g | INTRAVENOUS | Status: DC
  Administered 2024-11-27 – 2024-11-29 (×3): 2 g via INTRAVENOUS
  Filled 2024-11-26 (×3): qty 20

## 2024-11-26 MED ORDER — METHYLPREDNISOLONE SODIUM SUCC 125 MG IJ SOLR
125.0000 mg | Freq: Once | INTRAMUSCULAR | Status: AC
  Administered 2024-11-26: 125 mg via INTRAVENOUS
  Filled 2024-11-26: qty 2

## 2024-11-26 MED ORDER — SODIUM CHLORIDE 0.9 % IV SOLN
2.0000 g | Freq: Once | INTRAVENOUS | Status: AC
  Administered 2024-11-26: 2 g via INTRAVENOUS
  Filled 2024-11-26: qty 20

## 2024-11-26 MED ORDER — ACETAMINOPHEN 500 MG PO TABS
1000.0000 mg | ORAL_TABLET | Freq: Once | ORAL | Status: AC
  Administered 2024-11-26: 1000 mg via ORAL
  Filled 2024-11-26: qty 2

## 2024-11-26 MED ORDER — ONDANSETRON HCL 4 MG/2ML IJ SOLN
4.0000 mg | Freq: Once | INTRAMUSCULAR | Status: AC
  Administered 2024-11-26: 4 mg via INTRAVENOUS
  Filled 2024-11-26: qty 2

## 2024-11-26 MED ORDER — SODIUM CHLORIDE 0.9 % IV SOLN: 2.0000 g | INTRAVENOUS | Status: DC

## 2024-11-26 MED ORDER — ACETAMINOPHEN 650 MG RE SUPP: 650.0000 mg | Freq: Four times a day (QID) | RECTAL | Status: DC | PRN

## 2024-11-26 MED ORDER — GUAIFENESIN 100 MG/5ML PO LIQD
5.0000 mL | ORAL | Status: DC | PRN
  Administered 2024-11-28 (×2): 5 mL via ORAL
  Filled 2024-11-26 (×2): qty 10

## 2024-11-26 MED ORDER — SODIUM CHLORIDE 0.9 % IV SOLN
100.0000 mg | Freq: Two times a day (BID) | INTRAVENOUS | Status: DC
  Administered 2024-11-27 – 2024-11-29 (×5): 100 mg via INTRAVENOUS
  Filled 2024-11-26 (×5): qty 100

## 2024-11-26 MED ORDER — SODIUM CHLORIDE 0.9 % IV SOLN
100.0000 mg | Freq: Once | INTRAVENOUS | Status: AC
  Administered 2024-11-26: 100 mg via INTRAVENOUS
  Filled 2024-11-26: qty 100

## 2024-11-26 MED ORDER — LACTATED RINGERS IV BOLUS
2000.0000 mL | Freq: Once | INTRAVENOUS | Status: AC
  Administered 2024-11-26: 2000 mL via INTRAVENOUS

## 2024-11-26 MED ORDER — SODIUM CHLORIDE 0.9 % IV SOLN: INTRAVENOUS | Status: AC

## 2024-11-26 MED ORDER — ARFORMOTEROL TARTRATE 15 MCG/2ML IN NEBU
15.0000 ug | INHALATION_SOLUTION | Freq: Two times a day (BID) | RESPIRATORY_TRACT | Status: DC
  Administered 2024-11-27 – 2024-11-30 (×6): 15 ug via RESPIRATORY_TRACT
  Filled 2024-11-26 (×9): qty 2

## 2024-11-26 NOTE — ED Triage Notes (Addendum)
 Pt arrives via POV with c/o N/V/D, along with chills, fever that started on Wednesday. Pt denies OTC medication to help with symptoms. Pt hasn't been able to keep anything down for 2 days. Pt has also had a cough x3 weeks, endorses chest pain when they cough and is having trouble shaking it. Pt is A&Ox4 during triage.

## 2024-11-26 NOTE — Sepsis Progress Note (Signed)
 Following for sepsis monitoring ?

## 2024-11-26 NOTE — H&P (Signed)
 " History and Physical    Caitlyn Herrera FMW:[id number] DOB: 25-Nov-1960 DOA: 11/26/2024  PCP: Patient, No Pcp Per Patient coming from: Home  Chief Complaint: n/v/d and productive cough  HPI: Caitlyn Herrera is a 64 y.o. female with medical history significant of DM type 2,  CAD, essential hypertension, HLD, COPD primary lung squamous cell carcinoma, left who presents to the hospital with complaint of fever, nausea/vomiting/ diarrhea and productive cough for 3 days. The patient states that her symptoms started on Wednesday. She started to experience increasing nausea with 3 episodes of diarrhea, this was also associated with a productive yellowish sputum. She states that she had a temperature of 101 at home. She took over the counter medications to help alleviate her symptoms. She also reports using her inhalers at home and nebulizers without significant improvement. She stated that her symptoms were progressing in spite of all these measures and therefore she presented to the hospital for further evaluation.  The patient reports that her last episode of diarrhea was this morning.    ED Course:  In the ER, BP 98/57, HR 71, RR 18, O2 saturation 98% on RA,  and Tmax 102.1. Cbc demonstrated wbc 20.6,  hb/hct 11.7/35.5, and platelet 414. Chemistry demonstrated Na 133, K 3.8, Cl 97, bicarb 25, Bun/Cr 12/0.97 and glucose 163. Respiratory pcr was negative for flu/rsv and covid.  CXR demonstrated persistent retrocardiac left lower lobe consolidation as seen on prior CT. CT chest demonstrated progressive bronchopneumonia and/or malignancy greatest in the lingula and left lower lobe.  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 Continued follow-up is recommended. Infrarenal abdominal aortic aneurysm measuring up to 3.6 cm; recommend ultrasound follow-up in 3 years.   Review of Systems:  All systems reviewed and apart from history of presenting illness, are negative.  Past Medical History:   Diagnosis Date   Abdominal aortic ectasia    a. 11/2017 CTA chest/abd/pelvis: 2.5 cm abd ao ectasia -->rec f/u u/s in 5 yrs.   Anemia    Angina at rest    Aortic atherosclerosis    Asthma    CAD (coronary artery disease)    a. 11/04/17 Cath: Native multivessel dzs-->CABG x 3 (LIMA->LAD, VG->OM2, VG->RPDA; b. 11/2017 MV: mid antlat/apical isch; c. 11/2017 Cath: LM 30d, LAD 23m, LCX 80p/m, OM1 90, OM2 80 (@ anastamosis of graft), RCA 80p, 145m, 90d, LIMA->LAD nl, VG->OM2 nl, VG->RPDA nl-->Med Rx.   Cancer [facility])    Carotid arterial disease    a. 10/2017 Carotid U/S: 1-30% bilat ICA stenosis.   CHF (congestive heart failure) (HCC)    COPD (chronic obstructive pulmonary disease) (HCC)    DDD (degenerative disc disease), cervical    Dyspnea    GERD (gastroesophageal reflux disease)    History of echocardiogram    a. 11/04/2017 Echo: EF of 65-70%, no RWMA, nl LV diastolic fxn, nl RV size/fxn, mild TR; b. 03/2018 Echo: EF 60-65%, mild LVH, no rwma, mild MR. Mildy reduced RV fxn. Mild TR.   Hyperlipidemia    Hypertension    Left renal artery stenosis    a. 11/2017 CTA Chest/Abd/Pelvis: 50-70% L RA stenosis.   Long-term use of aspirin  therapy    Noncompliance with medication treatment due to intermittent use of medication    NSTEMI (non-ST elevated myocardial infarction) (HCC) 10/2017   Palpitations    a. 02/2018 Event Monitor: wore for 2 days, 21 hrs - rare PAC's/PVC's. Avg HR 66 (42-103); b. 04/2018 Event Monitor: Wore for 14  days. RSR, 67 (49-111). No significant arrhythmias.   Persistent cough    Pulmonary HTN (HCC)    Pulmonary nodule, right    a. 10/2017 CT Chest: 5mm pulm nodule in R lung apex - rec f/u w/ non-contrast chest CT in 1 year.   S/P CABG x 3 11/12/2017   a.) LIMA-LAD, SVG-OM2, SVG-RPDA   Sepsis (HCC)    Streptococcal pneumonia 12/19/2023   Thrombocytosis    Tobacco abuse    Type II diabetes mellitus (HCC)     Past Surgical History:  Procedure Laterality Date   BREAST BIOPSY  Right 06/23/2018   US  guided biopsy - pathology benign   BREAST CYST EXCISION Right    CORONARY ARTERY BYPASS GRAFT N/A 11/12/2017   Procedure: CORONARY ARTERY BYPASS GRAFTING (CABG) x Three , using left internal mammary artery and right leg greater saphenous vein harvested endoscopically;  Surgeon: Fleeta Hanford Coy, MD;  Location: [facility] OR;  Service: Open Heart Surgery;  Laterality: N/A;   DILATION AND CURETTAGE OF UTERUS     FLEXIBLE BRONCHOSCOPY Left 03/29/2024   Procedure: BRONCHOSCOPY, FLEXIBLE;  Surgeon: Tamea Dedra CROME, MD;  Location: [facility] ORS;  Service: Pulmonary;  Laterality: Left;   LAPAROSCOPIC CHOLECYSTECTOMY     LEFT HEART CATH AND CORONARY ANGIOGRAPHY N/A 11/04/2017   Procedure: LEFT HEART CATH AND CORONARY ANGIOGRAPHY;  Surgeon: Mady Bruckner, MD;  Location: [facility] INVASIVE CV LAB;  Service: Cardiovascular;  Laterality: N/A;   LEFT HEART CATH AND CORS/GRAFTS ANGIOGRAPHY N/A 12/09/2017   Procedure: LEFT HEART CATH AND CORS/GRAFTS ANGIOGRAPHY;  Surgeon: Jordan, Peter M, MD;  Location: [facility] INVASIVE CV LAB;  Service: Cardiovascular;  Laterality: N/A;   RIGHT/LEFT HEART CATH AND CORONARY ANGIOGRAPHY Bilateral 10/08/2022   Procedure: RIGHT/LEFT HEART CATH AND CORONARY ANGIOGRAPHY;  Surgeon: Mady Bruckner, MD;  Location: [facility] INVASIVE CV LAB;  Service: Cardiovascular;  Laterality: Bilateral;   TEE WITHOUT CARDIOVERSION N/A 11/12/2017   Procedure: TRANSESOPHAGEAL ECHOCARDIOGRAM (TEE);  Surgeon: Fleeta Hanford, Coy, MD;  Location: [facility] OR;  Service: Open Heart Surgery;  Laterality: N/A;   VIDEO BRONCHOSCOPY WITH ENDOBRONCHIAL ULTRASOUND Left 03/29/2024   Procedure: BRONCHOSCOPY, WITH EBUS;  Surgeon: Tamea Dedra CROME, MD;  Location: [facility] ORS;  Service: Pulmonary;  Laterality: Left;     reports that she quit smoking about 6 months ago. Her smoking use included cigarettes. She started smoking about 46 years ago. She has a 22.8 pack-year smoking history. She has never used smokeless tobacco. She  reports that she does not currently use alcohol. She reports that she does not use drugs.  Allergies[1]  Family History  Problem Relation Age of Onset   Hypertension Mother    Heart Problems Mother    Breast cancer Neg Hx     Prior to Admission medications  Medication Sig Start Date End Date Taking? Authorizing Provider  albuterol  (VENTOLIN  HFA) 108 (90 Base) MCG/ACT inhaler Inhale 2 puffs into the lungs every 6 (six) hours as needed for wheezing or shortness of breath. 03/16/24   Tamea Dedra CROME, MD  aspirin  EC 81 MG tablet Take 1 tablet (81 mg total) by mouth daily. 03/26/18   End, Bruckner, MD  atorvastatin  (LIPITOR ) 80 MG tablet TAKE 1 TABLET BY MOUTH DAILY 08/27/24   End, Bruckner, MD  azelastine (ASTELIN) 0.1 % nasal spray Place 2 sprays into both nostrils as needed. 11/05/18   [provider]  benzonatate  (TESSALON ) 100 MG capsule Take 1 capsule (100 mg total) by mouth 2 (two) times daily as needed for cough.  03/18/23   Tamea Dedra CROME, MD  carvedilol  (COREG ) 12.5 MG tablet TAKE 1 TABLET BY MOUTH TWICE DAILY *PATIENT NEEDS APPOINTMENT* 03/30/24   Furth, Cadence H, PA-C  cetirizine (ZYRTEC) 10 MG tablet Take 10 mg by mouth every morning.    [provider]  citalopram  (CELEXA ) 40 MG tablet Take 40 mg by mouth every morning. 12/16/22   [provider]  cyclobenzaprine  (FLEXERIL ) 10 MG tablet Take 10 mg by mouth 3 (three) times daily as needed. 11/03/18   [provider]  ezetimibe  (ZETIA ) 10 MG tablet TAKE 1 TABLET BY MOUTH DAILY 08/27/24   End, Lonni, MD  fluticasone  [facility]) 50 MCG/ACT nasal spray 2 sprays as needed. 01/06/19   [provider]  Fluticasone -Umeclidin-Vilant (TRELEGY ELLIPTA ) 200-62.5-25 MCG/ACT AEPB Inhale 1 puff into the lungs daily. 03/16/24   Tamea Dedra CROME, MD  gabapentin  (NEURONTIN ) 300 MG capsule Take 300 mg by mouth every morning. Has not started. 03/03/23   [provider]  ibuprofen  (ADVIL ) 800 MG  tablet Take 1 tablet (800 mg total) by mouth every 8 (eight) hours as needed. 09/24/24   Ward, Josette SAILOR, DO  Iron -Vitamin C  65-125 MG TABS Take 1 tablet by mouth daily. 06/08/24   Babara Call, MD  isosorbide  mononitrate (IMDUR ) 120 MG 24 hr tablet TAKE 1 TABLET BY MOUTH EVERY MORNING 09/27/24   End, Lonni, MD  lidocaine  (LIDODERM ) 5 % Place 1 patch onto the skin every 12 (twelve) hours. Remove & Discard patch within 12 hours or as directed by MD 08/06/24 08/06/25  Claudene Rover, MD  losartan  (COZAAR ) 25 MG tablet Take 0.5 tablets (12.5 mg total) by mouth daily. 02/04/24   Furth, Cadence H, PA-C  meloxicam (MOBIC) 15 MG tablet Take 15 mg by mouth daily as needed. 10/02/22   [provider]  methocarbamol  (ROBAXIN ) 500 MG tablet Take 1 tablet (500 mg total) by mouth every 8 (eight) hours as needed for muscle spasms. 09/06/24   Arlander Charleston, MD  methylPREDNISolone  (MEDROL  DOSEPAK) 4 MG TBPK tablet Take 6 tablets x 1 day, then take 5 tablets x 1 day, then take 4 tablets x 1 day, then take 3 tablets x 1 day, then 2 tablets x 1 day, then 1 tablet, then stop. Patient not taking: Reported on 08/03/2024 05/11/24   Borders, Fonda SAUNDERS, NP  naproxen  (NAPROSYN ) 500 MG tablet Take 1 tablet (500 mg total) by mouth 2 (two) times daily with a meal. 09/06/24   Arlander Charleston, MD  nitroGLYCERIN  (NITROSTAT ) 0.4 MG SL tablet DISSOLVE 1 TABLET UNDER THE TONGUE AS NEEDED FOR CHEST PAIN EVERY 5 MINUTES UP TO 3 TIMES. IF NO RELIEF CALL 911. 07/30/23   Vivienne Lonni Ingle, NP  omeprazole  (PRILOSEC) 40 MG capsule TAKE 1 CAPSULE BY MOUTH EVERY DAY BEFORE BREAKFAST 03/29/19   Vanga, Rohini Reddy, MD  ondansetron  (ZOFRAN ) 4 MG tablet Take 1 tablet (4 mg total) by mouth every 8 (eight) hours as needed for nausea or vomiting. 04/07/24   Babara Call, MD  ondansetron  (ZOFRAN -ODT) 4 MG disintegrating tablet Take 1 tablet (4 mg total) by mouth every 6 (six) hours as needed for nausea or vomiting. 09/24/24   Ward, Josette SAILOR, DO   oxyCODONE  10 MG TABS Take 1 tablet (10 mg total) by mouth every 8 (eight) hours as needed. 09/24/24 09/24/25  Ward, Josette SAILOR, DO  SitaGLIPtin-MetFORMIN  HCl (JANUMET XR) 50-1000 MG TB24 Take 1 tablet by mouth every morning.    [provider]  tiZANidine (ZANAFLEX) 2  MG tablet Take 2 mg by mouth 3 (three) times daily. 10/02/22   [provider]  traMADol  (ULTRAM ) 50 MG tablet Take 1 tablet (50 mg total) by mouth every 6 (six) hours as needed. 04/07/24   Babara Call, MD    Physical Exam: Vitals:   11/26/24 1725 11/26/24 1800 11/26/24 1854 11/26/24 1940  BP:  113/65  127/65  Pulse:  87  76  Resp:  17  18  Temp: (!) 102.1 F (38.9 C)  100.1 F (37.8 C) 99 F (37.2 C)  TempSrc: Oral  Oral Oral  SpO2:  100%  98%  Weight:      Height:        Physical Exam Constitutional:      General: He is not in acute distress.    Appearance: Normal appearance.  HENT:     Head: Normocephalic and atraumatic.  Eyes:     Extraocular Movements: Extraocular movements intact.     Conjunctiva/sclera: Conjunctivae normal.     Pupils: Pupils are equal, round, and reactive to light.  Cardiovascular:     Rate and Rhythm: Normal rate and regular rhythm.     Pulses: Normal pulses.     Heart sounds: Normal heart sounds.  Pulmonary:     Effort: Pulmonary effort is normal. No respiratory distress.     Breath sounds: good air entry in the right lung however diminished on the left  Abdominal:     General: Abdomen is flat. Bowel sounds are normal. There is no distension.     Palpations: Abdomen is soft.     Tenderness: There is no abdominal tenderness.  Musculoskeletal:        General: No deformity. Normal range of motion.  Skin:    General: Skin is warm and dry.     Coloration: Skin is not jaundiced.  Neurological:     General: No focal deficit present.     Mental Status: He is alert and oriented to person, place, and time. Mental status is at baseline.   Labs on Admission: I have  personally reviewed following labs and imaging studies  CBC: Recent Labs  Lab 11/26/24 1505  WBC 20.6*  HGB 11.7*  HCT 35.5*  MCV 89.4  PLT 414*   Basic Metabolic Panel: Recent Labs  Lab 11/26/24 1505  NA 133*  K 3.8  CL 97*  CO2 25  GLUCOSE 163*  BUN 12  CREATININE 0.97  CALCIUM  9.5   GFR: Estimated Creatinine Clearance: 58.2 mL/min (by C-G formula based on SCr of 0.97 mg/dL). Liver Function Tests: Recent Labs  Lab 11/26/24 1810  AST 22  ALT 10  ALKPHOS 120  BILITOT 1.2  PROT 7.6  ALBUMIN  3.8   Recent Labs  Lab 11/26/24 1810  LIPASE 33   No results for input(s): AMMONIA in the last 168 hours. Coagulation Profile: No results for input(s): INR, PROTIME in the last 168 hours. Cardiac Enzymes: No results for input(s): CKTOTAL, CKMB, CKMBINDEX, TROPONINI in the last 168 hours. BNP (last 3 results) No results for input(s): PROBNP in the last 8760 hours. HbA1C: No results for input(s): HGBA1C in the last 72 hours. CBG: No results for input(s): GLUCAP in the last 168 hours. Lipid Profile: No results for input(s): CHOL, HDL, LDLCALC, TRIG, CHOLHDL, LDLDIRECT in the last 72 hours. Thyroid  Function Tests: No results for input(s): TSH, T4TOTAL, FREET4, T3FREE, THYROIDAB in the last 72 hours. Anemia Panel: No results for input(s): VITAMINB12, FOLATE, FERRITIN, TIBC, IRON , RETICCTPCT in the  last 72 hours. Urine analysis:    Component Value Date/Time   COLORURINE YELLOW (A) 12/20/2023 1415   APPEARANCEUR CLEAR (A) 12/20/2023 1415   LABSPEC 1.040 (H) 12/20/2023 1415   PHURINE 5.0 12/20/2023 1415   GLUCOSEU NEGATIVE 12/20/2023 1415   HGBUR NEGATIVE 12/20/2023 1415   BILIRUBINUR NEGATIVE 12/20/2023 1415   KETONESUR NEGATIVE 12/20/2023 1415   PROTEINUR NEGATIVE 12/20/2023 1415   NITRITE NEGATIVE 12/20/2023 1415   LEUKOCYTESUR NEGATIVE 12/20/2023 1415    Radiological Exams on Admission: CT CHEST ABDOMEN  PELVIS W CONTRAST Result Date: 11/26/2024 EXAM: CT CHEST, ABDOMEN AND PELVIS WITH CONTRAST 11/26/2024 06:31:13 PM TECHNIQUE: CT of the chest, abdomen and pelvis was performed with the administration of 100 mL of iohexol  (OMNIPAQUE ) 300 MG/ML solution. Multiplanar reformatted images are provided for review. Automated exposure control, iterative reconstruction, and/or weight based adjustment of the mA/kV was utilized to reduce the radiation dose to as low as reasonably achievable. COMPARISON: Same day x-ray; CT 09/30/2024; PET/CT 04/06/2024. CLINICAL HISTORY: Septic, eval source. COPD exacerbation, productive cough, but also N/V/D. Sepsis; evaluation of source. COPD exacerbation with productive cough; nausea/vomiting/diarrhea. FINDINGS: CHEST: MEDIASTINUM AND LYMPH NODES: Heart and pericardium are unremarkable. Coronary artery calcifications. The central airways are clear. No mediastinal, hilar or axillary lymphadenopathy. LUNGS AND PLEURA: Scattered centrilobular micronodules and tree-in-bud opacities. More confluent consolidation in the lingula and left lower lobe. This has progressed compared to 09/30/2024. Occlusion of the left lower lobe bronchus. No pleural effusion. No pneumothorax. ABDOMEN AND PELVIS: LIVER: Unremarkable. GALLBLADDER AND BILE DUCTS: Cholecystectomy. No biliary ductal dilatation. SPLEEN: No acute abnormality. PANCREAS: No acute abnormality. ADRENAL GLANDS: No acute abnormality. KIDNEYS, URETERS AND BLADDER: No stones in the kidneys or ureters. No hydronephrosis. No perinephric or periureteral stranding. Urinary bladder is unremarkable. GI AND BOWEL: Stomach demonstrates no acute abnormality. There is no bowel obstruction. Normal appendix. REPRODUCTIVE ORGANS: No acute abnormality. PERITONEUM AND RETROPERITONEUM: No ascites. No free air. VASCULATURE: Infrarenal abdominal aortic aneurysm with irregular mural thrombus measuring up to 3.6 cm. ABDOMINAL AND PELVIS LYMPH NODES: No lymphadenopathy.  REPRODUCTIVE ORGANS: No acute abnormality. BONES AND SOFT TISSUES: Sternotomy and CABG. Atherosclerotic calcification related to prior CABG. No acute osseous abnormality. No focal soft tissue abnormality. IMPRESSION: 1. Progressive bronchopneumonia and / or malignancy greatest in the lingula and left lower lobe. 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 Continued follow-up is recommended. 2. Infrarenal abdominal aortic aneurysm measuring up to 3.6 cm; recommend ultrasound follow-up in 3 years. Electronically signed by: Norman Gatlin MD 11/26/2024 07:30 PM EST RP Workstation: [id number]   DG Chest 2 View Result Date: 11/26/2024 CLINICAL DATA:  Short of breath, nausea/vomiting/diarrhea EXAM: CHEST - 2 VIEW COMPARISON:  05/13/2024, 09/30/2024 FINDINGS: Frontal and lateral views of the chest demonstrate postsurgical changes from CABG. The cardiac silhouette is unremarkable. There are persistent areas of consolidation within the left lower lobe retrocardiac region, corresponding to the region seen on recent chest CT. No new airspace disease, effusion, or pneumothorax. No acute bony abnormalities. IMPRESSION: 1. Persistent retrocardiac left lower lobe consolidation as seen on prior CT. Given the lack of documented interval clearance, underlying neoplasm cannot be excluded. Repeat CT or bronchoscopy may be useful for further evaluation. Electronically Signed   By: Ozell Daring M.D.   On: 11/26/2024 16:26    EKG: Independently reviewed.   Assessment/Plan Principal Problem:   Sepsis (HCC) Active Problems:   HAP (hospital-acquired pneumonia)   CAD (coronary artery disease)   Essential hypertension   Chronic diastolic CHF (  congestive heart failure) (HCC)   Diabetes mellitus without complication (HCC)   Primary lung squamous cell carcinoma, left (HCC)   COPD (chronic obstructive pulmonary disease) (HCC)   Iron  deficiency anemia   Sepsis /HAP COPD Source of infection: pneumonia  Blood  culture, sputum culture  Imaging:CT chest demonstrated persistent retrocardiac consolidation within the LLL IV antibiotics continued with rocephin  and Doxycycline  at this time  Patient will also receive breathing treatments Patient receiving fluids also There is no improvement with fluids and started on pressors Will obtain echocardiogram  Will start on stress dose steroids  Continue to trend lactic acid     Primary lung squamous cell carcinoma left Patient is following with Dr. Zelphia Cap She has completed her last session of radiation therapy about 4 months ago previous PT  PET scan showed no hypermetabolic lymphadenopathy.  Biopsy of station 7 was negative.  Discussed with pulmonology, intra bronchoscopy impression of station 7 was suspicious. Patient declined surgery evaluation.  She is also not interested in mediastinoscopy evaluation of lymph node involvement. She is scheduled to have followup with repeat CT     CAD Will hold any hypertensive meds at this time She can continue aspirin  and statin therapy  Essential hypertension Hold blood pressure medications at this time  Chronic diastolic heart failure Will continue fluids and this time Monitor closely Patient has had nausea/ vomiting and can only tolerate liquid diet Hold diuretics and blood pressure medication at this time   DM type 2 Patient will be started on low dose sliding scale insulin  Will add basal dose insulin  as needed   DVT prophylaxis: lovenox    Code Status: full  Family Communication:   Disposition Plan: inpatient     Consults called: none Admission status: inpatient  Level of care: Level of care:  The medical decision making on this patient was of high complexity and the patient is at high risk for clinical deterioration, therefore this is a level 3 visit.  Bradly MARLA Drones MD [facility]  If 7PM-7AM, please contact night-coverage [email or web address]  11/26/2024, 8:33 PM       [1] No  Known Allergies  "

## 2024-11-26 NOTE — ED Provider Notes (Signed)
 "  [facility] Provider Note    Event Date/Time   First MD Initiated Contact with Patient 11/26/24 1633     (approximate)   History   Nausea, Emesis, and Diarrhea   HPI  Caitlyn Herrera is a 64 y.o. female who presents to the ED for evaluation of Nausea, Emesis, and Diarrhea   Reviewed cardiology clinic visit from October.  History of three-vessel CABG 2019, COPD, left lower lung Saranga skull carcinoma, HTN, DM  Patient presents with 2 days of nausea, vomiting and diarrhea in the setting of a productive cough and feeling unwell overall.   Physical Exam   Triage Vital Signs: ED Triage Vitals  Encounter Vitals Group     BP 11/26/24 1500 112/72     Girls Systolic BP Percentile --      Girls Diastolic BP Percentile --      Boys Systolic BP Percentile --      Boys Diastolic BP Percentile --      Pulse Rate 11/26/24 1500 99     Resp 11/26/24 1500 16     Temp 11/26/24 1500 99.8 F (37.7 C)     Temp Source 11/26/24 1500 Oral     SpO2 11/26/24 1500 93 %     Weight 11/26/24 1503 160 lb (72.6 kg)     Height 11/26/24 1503 5' 6 (1.676 m)     Head Circumference --      Peak Flow --      Pain Score 11/26/24 1501 10     Pain Loc --      Pain Education --      Exclude from Growth Chart --     Most recent vital signs: Vitals:   11/26/24 1854 11/26/24 1940  BP:  127/65  Pulse:  76  Resp:  18  Temp: 100.1 F (37.8 C) 99 F (37.2 C)  SpO2:  98%    General: Awake, no distress.  Looks uncomfortable CV:  Good peripheral perfusion.  Resp:  Mild tachypnea without distress.  Wheezing throughout and slightly decreased airflow. Abd:  No distention.  Soft and nontender MSK:  No deformity noted.  Neuro:  No focal deficits appreciated. Other:     ED Results / Procedures / Treatments   Labs (all labs ordered are listed, but only abnormal results are displayed) Labs Reviewed  CBC - Abnormal; Notable for the following components:      Result Value    WBC 20.6 (*)    Hemoglobin 11.7 (*)    HCT 35.5 (*)    Platelets 414 (*)    All other components within normal limits  BASIC METABOLIC PANEL WITH GFR - Abnormal; Notable for the following components:   Sodium 133 (*)    Chloride 97 (*)    Glucose, Bld 163 (*)    All other components within normal limits  HEPATIC FUNCTION PANEL - Abnormal; Notable for the following components:   Bilirubin, Direct 0.6 (*)    All other components within normal limits  RESP PANEL BY RT-PCR (RSV, FLU A&B, COVID)  RVPGX2  CULTURE, BLOOD (ROUTINE X 2)  CULTURE, BLOOD (ROUTINE X 2)  LACTIC ACID, PLASMA  PROCALCITONIN  LIPASE, BLOOD    EKG   RADIOLOGY CT chest, abdomen and pelvis interpreted by me with obstructive pneumonia  Official radiology report(s) CT CHEST ABDOMEN PELVIS W CONTRAST Result Date: 11/26/2024 EXAM: CT CHEST, ABDOMEN AND PELVIS WITH CONTRAST 11/26/2024 06:31:13 PM TECHNIQUE: CT of the chest, abdomen  and pelvis was performed with the administration of 100 mL of iohexol  (OMNIPAQUE ) 300 MG/ML solution. Multiplanar reformatted images are provided for review. Automated exposure control, iterative reconstruction, and/or weight based adjustment of the mA/kV was utilized to reduce the radiation dose to as low as reasonably achievable. COMPARISON: Same day x-ray; CT 09/30/2024; PET/CT 04/06/2024. CLINICAL HISTORY: Septic, eval source. COPD exacerbation, productive cough, but also N/V/D. Sepsis; evaluation of source. COPD exacerbation with productive cough; nausea/vomiting/diarrhea. FINDINGS: CHEST: MEDIASTINUM AND LYMPH NODES: Heart and pericardium are unremarkable. Coronary artery calcifications. The central airways are clear. No mediastinal, hilar or axillary lymphadenopathy. LUNGS AND PLEURA: Scattered centrilobular micronodules and tree-in-bud opacities. More confluent consolidation in the lingula and left lower lobe. This has progressed compared to 09/30/2024. Occlusion of the left lower lobe  bronchus. No pleural effusion. No pneumothorax. ABDOMEN AND PELVIS: LIVER: Unremarkable. GALLBLADDER AND BILE DUCTS: Cholecystectomy. No biliary ductal dilatation. SPLEEN: No acute abnormality. PANCREAS: No acute abnormality. ADRENAL GLANDS: No acute abnormality. KIDNEYS, URETERS AND BLADDER: No stones in the kidneys or ureters. No hydronephrosis. No perinephric or periureteral stranding. Urinary bladder is unremarkable. GI AND BOWEL: Stomach demonstrates no acute abnormality. There is no bowel obstruction. Normal appendix. REPRODUCTIVE ORGANS: No acute abnormality. PERITONEUM AND RETROPERITONEUM: No ascites. No free air. VASCULATURE: Infrarenal abdominal aortic aneurysm with irregular mural thrombus measuring up to 3.6 cm. ABDOMINAL AND PELVIS LYMPH NODES: No lymphadenopathy. REPRODUCTIVE ORGANS: No acute abnormality. BONES AND SOFT TISSUES: Sternotomy and CABG. Atherosclerotic calcification related to prior CABG. No acute osseous abnormality. No focal soft tissue abnormality. IMPRESSION: 1. Progressive bronchopneumonia and / or malignancy greatest in the lingula and left lower lobe. 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 Continued follow-up is recommended. 2. Infrarenal abdominal aortic aneurysm measuring up to 3.6 cm; recommend ultrasound follow-up in 3 years. Electronically signed by: Norman Gatlin MD 11/26/2024 07:30 PM EST RP Workstation: [id number]   DG Chest 2 View Result Date: 11/26/2024 CLINICAL DATA:  Short of breath, nausea/vomiting/diarrhea EXAM: CHEST - 2 VIEW COMPARISON:  05/13/2024, 09/30/2024 FINDINGS: Frontal and lateral views of the chest demonstrate postsurgical changes from CABG. The cardiac silhouette is unremarkable. There are persistent areas of consolidation within the left lower lobe retrocardiac region, corresponding to the region seen on recent chest CT. No new airspace disease, effusion, or pneumothorax. No acute bony abnormalities. IMPRESSION: 1. Persistent retrocardiac  left lower lobe consolidation as seen on prior CT. Given the lack of documented interval clearance, underlying neoplasm cannot be excluded. Repeat CT or bronchoscopy may be useful for further evaluation. Electronically Signed   By: Ozell Daring M.D.   On: 11/26/2024 16:26    PROCEDURES and INTERVENTIONS:  .Critical Care  Performed by: Claudene Rover, MD Authorized by: Claudene Rover, MD   Critical care provider statement:    Critical care time (minutes):  30   Critical care time was exclusive of:  Separately billable procedures and treating other patients   Critical care was necessary to treat or prevent imminent or life-threatening deterioration of the following conditions:  Sepsis   Critical care was time spent personally by me on the following activities:  Development of treatment plan with patient or surrogate, discussions with consultants, evaluation of patient's response to treatment, examination of patient, ordering and review of laboratory studies, ordering and review of radiographic studies, ordering and performing treatments and interventions, pulse oximetry, re-evaluation of patient's condition and review of old charts .1-3 Lead EKG Interpretation  Performed by: Claudene Rover, MD Authorized by: Claudene Rover, MD  Interpretation: normal     ECG rate:  70   ECG rate assessment: normal     Rhythm: sinus rhythm     Ectopy: none     Conduction: normal     Medications  cefTRIAXone  (ROCEPHIN ) 2 g in sodium chloride  0.9 % 100 mL IVPB (has no administration in time range)  doxycycline  (VIBRAMYCIN ) 100 mg in sodium chloride  0.9 % 250 mL IVPB (has no administration in time range)  lactated ringers  infusion (has no administration in time range)  ipratropium-albuterol  (DUONEB) 0.5-2.5 (3) MG/3ML nebulizer solution 6 mL (6 mLs Nebulization Given 11/26/24 1740)  methylPREDNISolone  sodium succinate (SOLU-MEDROL ) 125 mg/2 mL injection 125 mg (125 mg Intravenous Given 11/26/24 1739)  lactated  ringers  bolus 2,000 mL (2,000 mLs Intravenous New Bag/Given 11/26/24 1740)  acetaminophen  (TYLENOL ) tablet 1,000 mg (1,000 mg Oral Given 11/26/24 1740)  ondansetron  (ZOFRAN ) injection 4 mg (4 mg Intravenous Given 11/26/24 1744)  iohexol  (OMNIPAQUE ) 300 MG/ML solution 100 mL (100 mLs Intravenous Contrast Given 11/26/24 1831)     IMPRESSION / MDM / ASSESSMENT AND PLAN / ED COURSE  I reviewed the triage vital signs and the nursing notes.  Differential diagnosis includes, but is not limited to, SBO, viral syndrome, pneumonia, COPD exacerbation  {Patient presents with symptoms of an acute illness or injury that is potentially life-threatening.  Patient presents with 2 days of nausea, vomiting and diarrhea in the setting of a productive cough with signs of sepsis from pneumonia as well as a COPD exacerbation requiring medical admission.  Borderline temperature at 100.1, she is on room air without tachycardia or shock.  Leukocytosis is noted mildly elevated procalcitonin, normal lactic acid and viral swabs, no significant metabolic derangements.  Imaging with signs of pneumonia without intra-abdominal pathology.  Provide CAP coverage antibiotics and consult medicine for admission.  DuoNebs and steroids due to wheezing and evidence of COPD exacerbation.  Clinical Course as of 11/26/24 2036  Fri Nov 26, 2024  1729 Productive cough, 2 days N/V/D [DS]  2036 Consult with medicine who agrees to admit [DS]    Clinical Course User Index [DS] Claudene Rover, MD     FINAL CLINICAL IMPRESSION(S) / ED DIAGNOSES   Final diagnoses:  Sepsis due to pneumonia (HCC)  COPD exacerbation (HCC)  Nausea vomiting and diarrhea     Rx / DC Orders   ED Discharge Orders     None        Note:  This document was prepared using Dragon voice recognition software and may include unintentional dictation errors.   Claudene Rover, MD 11/26/24 [phone]  "

## 2024-11-27 DIAGNOSIS — A419 Sepsis, unspecified organism: Secondary | ICD-10-CM | POA: Diagnosis not present

## 2024-11-27 DIAGNOSIS — J189 Pneumonia, unspecified organism: Secondary | ICD-10-CM | POA: Diagnosis not present

## 2024-11-27 DIAGNOSIS — J9601 Acute respiratory failure with hypoxia: Secondary | ICD-10-CM | POA: Diagnosis not present

## 2024-11-27 DIAGNOSIS — R6520 Severe sepsis without septic shock: Secondary | ICD-10-CM | POA: Diagnosis not present

## 2024-11-27 LAB — LACTIC ACID, PLASMA: Lactic Acid, Venous: 1.3 mmol/L (ref 0.5–1.9)

## 2024-11-27 LAB — CBC
HCT: 31.6 % — ABNORMAL LOW (ref 36.0–46.0)
HCT: 32.2 % — ABNORMAL LOW (ref 36.0–46.0)
Hemoglobin: 10.3 g/dL — ABNORMAL LOW (ref 12.0–15.0)
Hemoglobin: 10.6 g/dL — ABNORMAL LOW (ref 12.0–15.0)
MCH: 29.6 pg (ref 26.0–34.0)
MCH: 30.2 pg (ref 26.0–34.0)
MCHC: 32.6 g/dL (ref 30.0–36.0)
MCHC: 32.9 g/dL (ref 30.0–36.0)
MCV: 90.8 fL (ref 80.0–100.0)
MCV: 91.7 fL (ref 80.0–100.0)
Platelets: 356 10*3/uL (ref 150–400)
Platelets: 374 10*3/uL (ref 150–400)
RBC: 3.48 MIL/uL — ABNORMAL LOW (ref 3.87–5.11)
RBC: 3.51 MIL/uL — ABNORMAL LOW (ref 3.87–5.11)
RDW: 15 % (ref 11.5–15.5)
RDW: 15 % (ref 11.5–15.5)
WBC: 18.3 10*3/uL — ABNORMAL HIGH (ref 4.0–10.5)
WBC: 16.2 10*3/uL — ABNORMAL HIGH (ref 4.0–10.5)
nRBC: 0 % (ref 0.0–0.2)
nRBC: 0 % (ref 0.0–0.2)

## 2024-11-27 LAB — GLUCOSE, CAPILLARY
Glucose-Capillary: 160 mg/dL — ABNORMAL HIGH (ref 70–99)
Glucose-Capillary: 122 mg/dL — ABNORMAL HIGH (ref 70–99)
Glucose-Capillary: 195 mg/dL — ABNORMAL HIGH (ref 70–99)
Glucose-Capillary: 129 mg/dL — ABNORMAL HIGH (ref 70–99)

## 2024-11-27 LAB — BASIC METABOLIC PANEL WITH GFR
Anion gap: 11 (ref 5–15)
BUN: 14 mg/dL (ref 8–23)
CO2: 23 mmol/L (ref 22–32)
Calcium: 9.1 mg/dL (ref 8.9–10.3)
Chloride: 107 mmol/L (ref 98–111)
Creatinine, Ser: 0.68 mg/dL (ref 0.44–1.00)
GFR, Estimated: >60 mL/min
Glucose, Bld: 141 mg/dL — ABNORMAL HIGH (ref 70–99)
Potassium: 3.9 mmol/L (ref 3.5–5.1)
Sodium: 141 mmol/L (ref 135–145)

## 2024-11-27 LAB — HEMOGLOBIN A1C
Hgb A1c MFr Bld: 6 % — ABNORMAL HIGH (ref 4.8–5.6)
Mean Plasma Glucose: 125.5 mg/dL

## 2024-11-27 LAB — CREATININE, SERUM
Creatinine, Ser: 0.81 mg/dL (ref 0.44–1.00)
GFR, Estimated: >60 mL/min

## 2024-11-27 MED ORDER — ASPIRIN 81 MG PO TBEC
81.0000 mg | DELAYED_RELEASE_TABLET | Freq: Every day | ORAL | Status: DC
  Administered 2024-11-27 – 2024-11-30 (×4): 81 mg via ORAL
  Filled 2024-11-27 (×4): qty 1

## 2024-11-27 MED ORDER — CITALOPRAM HYDROBROMIDE 20 MG PO TABS
40.0000 mg | ORAL_TABLET | Freq: Every day | ORAL | Status: DC
  Administered 2024-11-27 – 2024-11-30 (×4): 40 mg via ORAL
  Filled 2024-11-27 (×4): qty 2

## 2024-11-27 MED ORDER — INSULIN ASPART 100 UNIT/ML IJ SOLN
0.0000 [IU] | Freq: Three times a day (TID) | INTRAMUSCULAR | Status: DC
  Administered 2024-11-27 (×2): 2 [IU] via SUBCUTANEOUS
  Filled 2024-11-27 (×2): qty 2

## 2024-11-27 MED ORDER — ATORVASTATIN CALCIUM 20 MG PO TABS
80.0000 mg | ORAL_TABLET | Freq: Every day | ORAL | Status: DC
  Administered 2024-11-27 – 2024-11-30 (×4): 80 mg via ORAL
  Filled 2024-11-27 (×4): qty 4

## 2024-11-27 MED ORDER — LORATADINE 10 MG PO TABS
10.0000 mg | ORAL_TABLET | Freq: Every day | ORAL | Status: DC
  Administered 2024-11-27 – 2024-11-30 (×4): 10 mg via ORAL
  Filled 2024-11-27 (×4): qty 1

## 2024-11-27 MED ORDER — OXYCODONE HCL 5 MG PO TABS: 10.0000 mg | ORAL_TABLET | Freq: Three times a day (TID) | ORAL | Status: DC | PRN

## 2024-11-27 MED ORDER — EZETIMIBE 10 MG PO TABS
10.0000 mg | ORAL_TABLET | Freq: Every day | ORAL | Status: DC
  Administered 2024-11-27 – 2024-11-30 (×4): 10 mg via ORAL
  Filled 2024-11-27 (×4): qty 1

## 2024-11-27 MED ORDER — CYCLOBENZAPRINE HCL 10 MG PO TABS
10.0000 mg | ORAL_TABLET | Freq: Three times a day (TID) | ORAL | Status: DC | PRN
  Administered 2024-11-28: 10 mg via ORAL
  Filled 2024-11-27: qty 1

## 2024-11-27 NOTE — Evaluation (Signed)
 Occupational Therapy Evaluation Patient Details Name: Caitlyn Herrera MRN: [id number] DOB: [birth year] Today's Date: 11/27/2024   History of Present Illness   Caitlyn Herrera is a 64 y.o. female with medical history significant of DM type 2,  CAD, essential hypertension, HLD, COPD primary lung squamous cell carcinoma, left who presents to the hospital with complaint of fever, nausea/vomiting/ diarrhea and productive cough for 3 days. The patient states that her symptoms started on Wednesday. She started to experience increasing nausea with 3 episodes of diarrhea, this was also associated with a productive yellowish sputum. She states that she had a temperature of 101 at home. She took over the counter medications to help alleviate her symptoms. She also reports using her inhalers at home and nebulizers without significant improvement. She stated that her symptoms were progressing in spite of all these measures and therefore she presented to the hospital for further evaluation.     Clinical Impressions Pt. presents with weakness, limited activity tolerance, and limited functional mobility which limits the ability to complete basic ADL and IADL functioning. Pt. resides at home alone, and her daughter/grandchildren live across the street. Pt. Was independent with ADLs, and IADL functioning: including meal preparation, and medication management. Pt. Was able to drive. Pt. Requires CGA for ADL tasks. Pt. reports getting winded during activity. SPO2 97%.  Pt. Education was provided about PLB techniques, and energy conservation/work simplification techniques for ADLs, and IADLs. Pt. Will  benefit from OT services for ADL training, A/E training, UE there. Ex. and Pt./caregiver education about energy conservation/work simplification, home modification, and DME.       If plan is discharge home, recommend the following:         Functional Status Assessment   Patient has had a recent decline in  their functional status and/or demonstrates limited ability to make significant improvements in function in a reasonable and predictable amount of time     Equipment Recommendations         Recommendations for Other Services         Precautions/Restrictions   Precautions Precautions: None Restrictions Weight Bearing Restrictions Per Provider Order: No     Mobility Bed Mobility Overal bed mobility: Modified Independent                  Transfers Overall transfer level: Modified independent                        Balance                                           ADL either performed or assessed with clinical judgement   ADL Overall ADL's : Needs assistance/impaired Eating/Feeding: Independent;Set up   Grooming: Contact guard assist               Lower Body Dressing: Contact guard assist                       Vision Baseline Vision/History: 0 No visual deficits       Perception         Praxis         Pertinent Vitals/Pain Pain Assessment Pain Assessment: No/denies pain     Extremity/Trunk Assessment Upper Extremity Assessment Upper Extremity Assessment: Generalized weakness           Communication  Communication Communication: No apparent difficulties   Cognition Arousal: Alert Behavior During Therapy: WFL for tasks assessed/performed Cognition: No apparent impairments                               Following commands: Intact       Cueing  General Comments   Cueing Techniques: Verbal cues      Exercises     Shoulder Instructions      Home Living Family/patient expects to be discharged to:: Private residence   Available Help at Discharge: Family;Available PRN/intermittently Type of Home: Apartment       Home Layout: One level     Bathroom Shower/Tub: Producer, Television/film/video: Standard Bathroom Accessibility: Yes   Home Equipment: Shower seat           Prior Functioning/Environment Prior Level of Function : Independent/Modified Independent                    OT Problem List: Decreased strength;Pain;Impaired UE functional use;Impaired balance (sitting and/or standing);Decreased activity tolerance;Decreased knowledge of use of DME or AE   OT Treatment/Interventions: Self-care/ADL training;Therapeutic exercise;Neuromuscular education;DME and/or AE instruction;Visual/perceptual remediation/compensation;Patient/family education;Cognitive remediation/compensation;Therapeutic activities      OT Goals(Current goals can be found in the care plan section)   Acute Rehab OT Goals Patient Stated Goal: To return home OT Goal Formulation: With patient Time For Goal Achievement: 12/11/24 Potential to Achieve Goals: Good   OT Frequency:  Min 1X/week    Co-evaluation              AM-PAC OT 6 Clicks Daily Activity     Outcome Measure Help from another person eating meals?: None Help from another person taking care of personal grooming?: A Little Help from another person toileting, which includes using toliet, bedpan, or urinal?: A Little Help from another person bathing (including washing, rinsing, drying)?: A Little Help from another person to put on and taking off regular upper body clothing?: None Help from another person to put on and taking off regular lower body clothing?: A Little 6 Click Score: 20   End of Session Equipment Utilized During Treatment: Gait belt  Activity Tolerance: Patient tolerated treatment well Patient left: in bed;with bed alarm set;with call bell/phone within reach  OT Visit Diagnosis: Muscle weakness (generalized) (M62.81)                Time: 1040-1105 OT Time Calculation (min): 25 min Charges:  OT General Charges $OT Visit: 1 Visit OT Evaluation $OT Eval Moderate Complexity: 1 Mod Richardson Otter, MS, OTR/L  Richardson Otter 11/27/2024, 2:41 PM

## 2024-11-27 NOTE — Progress Notes (Signed)
 " Progress Note   Patient: Caitlyn Herrera FMW:[id number] DOB: 05/23/1961 DOA: 11/26/2024     1 DOS: the patient was seen and examined on 11/27/2024   Brief hospital course:  Caitlyn Herrera is a 64 y.o. female with medical history significant of DM type 2,  CAD, essential hypertension, HLD, COPD primary lung squamous cell carcinoma, left who presents to the hospital with complaint of fever, nausea/vomiting/ diarrhea and productive cough for 3 days. The patient states that her symptoms started on Wednesday. She started to experience increasing nausea with 3 episodes of diarrhea, this was also associated with a productive yellowish sputum. She states that she had a temperature of 101 at home. She took over the counter medications to help alleviate her symptoms. She also reports using her inhalers at home and nebulizers without significant improvement. She stated that her symptoms were progressing in spite of all these measures and therefore she presented to the hospital for further evaluation.   The patient reports that her last episode of diarrhea was this morning.    ED Course:  In the ER, BP 98/57, HR 71, RR 18, O2 saturation 98% on RA,  and Tmax 102.1. Cbc demonstrated wbc 20.6,  hb/hct 11.7/35.5, and platelet 414. Chemistry demonstrated Na 133, K 3.8, Cl 97, bicarb 25, Bun/Cr 12/0.97 and glucose 163. Respiratory pcr was negative for flu/rsv and covid.  CXR demonstrated persistent retrocardiac left lower lobe consolidation as seen on prior CT. CT chest demonstrated progressive bronchopneumonia and/or malignancy greatest in the lingula and left lower lobe.  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 Continued follow-up is recommended. Infrarenal abdominal aortic aneurysm measuring up to 3.6 cm; recommend ultrasound follow-up in 3 years.     Assessment and Plan:  Sepsis secondary to community-acquired pneumonia Patient presents with temperature up to 102 degrees, WBC 18 and tachycardia  in the setting of pneumonia as shown on CT scan and chest x-ray of the lungs Follow-up blood culture, sputum culture  Imaging:CT chest demonstrated persistent retrocardiac consolidation within the LLL Continue ceftriaxone  and doxycycline  Monitor for oxygen requirement  Squamous cell carcinoma of the lung Patient is following with Dr. Zelphia Cap She has completed her last session of radiation therapy about 4 months ago previous PT  PET scan showed no hypermetabolic lymphadenopathy.  Biopsy of station 7 was negative.  Discussed with pulmonology, intra bronchoscopy impression of station 7 was suspicious. Patient declined surgery evaluation.  She is also not interested in mediastinoscopy evaluation of lymph node involvement. She is scheduled to have followup with repeat CT      CAD Will hold any hypertensive meds at this time She can continue aspirin  and statin therapy   Essential hypertension Continue to monitor blood pressure closely   Chronic diastolic heart failure Will continue fluids and this time Monitor closely Patient has had nausea/ vomiting and can only tolerate liquid diet Hold diuretics and blood pressure medication at this time and resume when blood pressure allows   DM type 2 Continue sliding scale Monitor glucose closely     DVT prophylaxis: lovenox     Code Status: full   Family Communication: No family at bedside  Disposition Plan: inpatient     Subjective:  Patient seen and examined at bedside this morning Still having some shortness of breath Cough improving Denies nausea vomiting chest pain  Physical Exam:  Extraocular Movements: Extraocular movements intact.  Cardiovascular:     Rate and Rhythm: Normal rate and regular rhythm.  Pulses: Normal pulses.     Heart sounds: Normal heart sounds.  Pulmonary:     Effort: Pulmonary effort is normal.  Abdominal:     General: Abdomen is flat. Bowel sounds are normal. There is no distension.      Palpations: Abdomen is soft.     Tenderness: There is no abdominal tenderness.  Musculoskeletal:        General: No deformity. Normal range of motion.  Skin:    General: Skin is warm and dry.     Coloration: Skin is not jaundiced.  Neurological:     General: No focal deficit present.     Mental Status: He is alert and oriented to person, place, and time. Mental status is at baseline.   Vitals:   11/27/24 0453 11/27/24 0757 11/27/24 0827 11/27/24 1405  BP: (!) 147/67  138/69   Pulse: 67  63   Resp: 18  18   Temp: (!) 97.5 F (36.4 C)  98 F (36.7 C)   TempSrc:      SpO2: 100% 99% 100% 98%  Weight:      Height:        Data Reviewed: Chest x-ray showing findings of consolidation on the left lower lobe    Latest Ref Rng & Units 11/27/2024    6:26 AM 11/26/2024   11:28 PM 11/26/2024    3:05 PM  CBC  WBC 4.0 - 10.5 K/uL 16.2  18.3  20.6   Hemoglobin 12.0 - 15.0 g/dL 89.3  89.6  88.2   Hematocrit 36.0 - 46.0 % 32.2  31.6  35.5   Platelets 150 - 400 K/uL 374  356  414        Latest Ref Rng & Units 11/27/2024    6:26 AM 11/26/2024   11:28 PM 11/26/2024    3:05 PM  BMP  Glucose 70 - 99 mg/dL 858   836   BUN 8 - 23 mg/dL 14   12   Creatinine 9.55 - 1.00 mg/dL 9.31  9.18  9.02   Sodium 135 - 145 mmol/L 141   133   Potassium 3.5 - 5.1 mmol/L 3.9   3.8   Chloride 98 - 111 mmol/L 107   97   CO2 22 - 32 mmol/L 23   25   Calcium  8.9 - 10.3 mg/dL 9.1   9.5       Author: Drue ONEIDA Potter, MD 11/27/2024 3:54 PM  For on call review [email or web address].  "

## 2024-11-28 DIAGNOSIS — J189 Pneumonia, unspecified organism: Secondary | ICD-10-CM | POA: Diagnosis not present

## 2024-11-28 LAB — BASIC METABOLIC PANEL WITH GFR
Anion gap: 10 (ref 5–15)
BUN: 15 mg/dL (ref 8–23)
CO2: 23 mmol/L (ref 22–32)
Calcium: 9.2 mg/dL (ref 8.9–10.3)
Chloride: 108 mmol/L (ref 98–111)
Creatinine, Ser: 0.65 mg/dL (ref 0.44–1.00)
GFR, Estimated: >60 mL/min
Glucose, Bld: 106 mg/dL — ABNORMAL HIGH (ref 70–99)
Potassium: 3.5 mmol/L (ref 3.5–5.1)
Sodium: 141 mmol/L (ref 135–145)

## 2024-11-28 LAB — CBC WITH DIFFERENTIAL/PLATELET
Abs Immature Granulocytes: 0.84 10*3/uL — ABNORMAL HIGH (ref 0.00–0.07)
Basophils Absolute: 0.1 10*3/uL (ref 0.0–0.1)
Basophils Relative: 0 %
Eosinophils Absolute: 0 10*3/uL (ref 0.0–0.5)
Eosinophils Relative: 0 %
HCT: 30.1 % — ABNORMAL LOW (ref 36.0–46.0)
Hemoglobin: 9.9 g/dL — ABNORMAL LOW (ref 12.0–15.0)
Immature Granulocytes: 3 %
Lymphocytes Relative: 4 %
Lymphs Abs: 1 10*3/uL (ref 0.7–4.0)
MCH: 29.6 pg (ref 26.0–34.0)
MCHC: 32.9 g/dL (ref 30.0–36.0)
MCV: 90.1 fL (ref 80.0–100.0)
Monocytes Absolute: 1.6 10*3/uL — ABNORMAL HIGH (ref 0.1–1.0)
Monocytes Relative: 6 %
Neutro Abs: 21.8 10*3/uL — ABNORMAL HIGH (ref 1.7–7.7)
Neutrophils Relative %: 87 %
Platelets: 376 10*3/uL (ref 150–400)
RBC: 3.34 MIL/uL — ABNORMAL LOW (ref 3.87–5.11)
RDW: 14.9 % (ref 11.5–15.5)
Smear Review: NORMAL
WBC: 25.3 10*3/uL — ABNORMAL HIGH (ref 4.0–10.5)
nRBC: 0 % (ref 0.0–0.2)

## 2024-11-28 LAB — GLUCOSE, CAPILLARY
Glucose-Capillary: 102 mg/dL — ABNORMAL HIGH (ref 70–99)
Glucose-Capillary: 89 mg/dL (ref 70–99)
Glucose-Capillary: 124 mg/dL — ABNORMAL HIGH (ref 70–99)
Glucose-Capillary: 110 mg/dL — ABNORMAL HIGH (ref 70–99)

## 2024-11-28 MED ORDER — GABAPENTIN 300 MG PO CAPS
300.0000 mg | ORAL_CAPSULE | Freq: Every day | ORAL | Status: DC
  Administered 2024-11-28 – 2024-11-29 (×2): 300 mg via ORAL
  Filled 2024-11-28 (×2): qty 1

## 2024-11-28 MED ORDER — MELATONIN 5 MG PO TABS
5.0000 mg | ORAL_TABLET | Freq: Every evening | ORAL | Status: DC | PRN
  Administered 2024-11-28: 5 mg via ORAL
  Filled 2024-11-28: qty 1

## 2024-11-28 MED ORDER — HYDROCHLOROTHIAZIDE 12.5 MG PO CAPS
12.5000 mg | ORAL_CAPSULE | Freq: Every day | ORAL | Status: DC
  Filled 2024-11-28: qty 1

## 2024-11-28 MED ORDER — ISOSORBIDE MONONITRATE ER 30 MG PO TB24
120.0000 mg | ORAL_TABLET | Freq: Every morning | ORAL | Status: DC
  Administered 2024-11-28 – 2024-11-30 (×2): 120 mg via ORAL
  Filled 2024-11-28 (×2): qty 4

## 2024-11-28 MED ORDER — LOSARTAN POTASSIUM 25 MG PO TABS
12.5000 mg | ORAL_TABLET | Freq: Every day | ORAL | Status: DC
  Administered 2024-11-28 – 2024-11-30 (×2): 12.5 mg via ORAL
  Filled 2024-11-28 (×2): qty 1

## 2024-11-28 MED ORDER — IPRATROPIUM-ALBUTEROL 0.5-2.5 (3) MG/3ML IN SOLN
3.0000 mL | Freq: Three times a day (TID) | RESPIRATORY_TRACT | Status: DC
  Administered 2024-11-29 – 2024-11-30 (×4): 3 mL via RESPIRATORY_TRACT
  Filled 2024-11-28 (×4): qty 3

## 2024-11-28 MED ORDER — GABAPENTIN 300 MG PO CAPS: 300.0000 mg | ORAL_CAPSULE | ORAL | Status: DC

## 2024-11-28 MED ORDER — HYDROCHLOROTHIAZIDE 12.5 MG PO TABS
12.5000 mg | ORAL_TABLET | Freq: Every day | ORAL | Status: DC
  Administered 2024-11-28 – 2024-11-30 (×2): 12.5 mg via ORAL
  Filled 2024-11-28 (×2): qty 1

## 2024-11-28 MED ORDER — CARVEDILOL 6.25 MG PO TABS
12.5000 mg | ORAL_TABLET | Freq: Two times a day (BID) | ORAL | Status: DC
  Administered 2024-11-28 – 2024-11-30 (×4): 12.5 mg via ORAL
  Filled 2024-11-28 (×4): qty 2

## 2024-11-28 NOTE — Progress Notes (Signed)
 " Progress Note   Patient: Caitlyn Herrera FMW:[id number] DOB: Jun 02, 1961 DOA: 11/26/2024     2 DOS: the patient was seen and examined on 11/28/2024    Brief hospital course:  Caitlyn Herrera is a 64 y.o. female with medical history significant of DM type 2,  CAD, essential hypertension, HLD, COPD primary lung squamous cell carcinoma, left who presents to the hospital with complaint of fever, nausea/vomiting/ diarrhea and productive cough for 3 days. The patient states that her symptoms started on Wednesday. She started to experience increasing nausea with 3 episodes of diarrhea, this was also associated with a productive yellowish sputum. She states that she had a temperature of 101 at home. She took over the counter medications to help alleviate her symptoms. She also reports using her inhalers at home and nebulizers without significant improvement. She stated that her symptoms were progressing in spite of all these measures and therefore she presented to the hospital for further evaluation.   The patient reports that her last episode of diarrhea was this morning.    ED Course:  In the ER, BP 98/57, HR 71, RR 18, O2 saturation 98% on RA,  and Tmax 102.1. Cbc demonstrated wbc 20.6,  hb/hct 11.7/35.5, and platelet 414. Chemistry demonstrated Na 133, K 3.8, Cl 97, bicarb 25, Bun/Cr 12/0.97 and glucose 163. Respiratory pcr was negative for flu/rsv and covid.  CXR demonstrated persistent retrocardiac left lower lobe consolidation as seen on prior CT. CT chest demonstrated progressive bronchopneumonia and/or malignancy greatest in the lingula and left lower lobe.  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 Continued follow-up is recommended. Infrarenal abdominal aortic aneurysm measuring up to 3.6 cm; recommend ultrasound follow-up in 3 years.      Assessment and Plan:   Sepsis secondary to community-acquired pneumonia Patient presents with temperature up to 102 degrees, WBC 18 and  tachycardia in the setting of pneumonia as shown on CT scan and chest x-ray of the lungs Follow-up blood culture, sputum culture  Imaging:CT chest demonstrated persistent retrocardiac consolidation within the LLL Continue ceftriaxone  and doxycycline  Monitor for oxygen requirement   Squamous cell carcinoma of the lung Patient is following with Dr. Zelphia Cap She has completed her last session of radiation therapy about 4 months ago She is scheduled to have followup with repeat CT      CAD Will hold any hypertensive meds at this time She can continue aspirin  and statin therapy   Essential hypertension Continue to monitor blood pressure closely   Chronic diastolic heart failure Continue daily weight Continue to monitor input and output Continue losartan , Imdur  and carvedilol   DM type 2 Continue sliding scale Monitor glucose closely     DVT prophylaxis: lovenox      Code Status: full    Family Communication: No family at bedside   Disposition Plan: inpatient       Subjective:  Patient seen and examined at bedside this morning Shortness of breath gradually improving Still have some cough with exertion   Physical Exam:  Extraocular Movements: Extraocular movements intact.  Cardiovascular:     Rate and Rhythm: Normal rate and regular rhythm.     Pulses: Normal pulses.     Heart sounds: Normal heart sounds.  Pulmonary:     Effort: Pulmonary effort is normal.  Abdominal:     General: Abdomen is flat. Bowel sounds are normal. There is no distension.     Palpations: Abdomen is soft.     Tenderness: There is  no abdominal tenderness.  Musculoskeletal:        General: No deformity. Normal range of motion.  Skin:    General: Skin is warm and dry.     Coloration: Skin is not jaundiced.  Neurological:     General: No focal deficit present.     Mental Status: He is alert and oriented to person, place, and time. Mental status is at baseline.     Data Reviewed:    Latest  Ref Rng & Units 11/28/2024    5:13 AM 11/27/2024    6:26 AM 11/26/2024   11:28 PM  CBC  WBC 4.0 - 10.5 K/uL 25.3  16.2  18.3   Hemoglobin 12.0 - 15.0 g/dL 9.9  89.3  89.6   Hematocrit 36.0 - 46.0 % 30.1  32.2  31.6   Platelets 150 - 400 K/uL 376  374  356        Latest Ref Rng & Units 11/28/2024    5:13 AM 11/27/2024    6:26 AM 11/26/2024   11:28 PM  BMP  Glucose 70 - 99 mg/dL 893  858    BUN 8 - 23 mg/dL 15  14    Creatinine 9.55 - 1.00 mg/dL 9.34  9.31  9.18   Sodium 135 - 145 mmol/L 141  141    Potassium 3.5 - 5.1 mmol/L 3.5  3.9    Chloride 98 - 111 mmol/L 108  107    CO2 22 - 32 mmol/L 23  23    Calcium  8.9 - 10.3 mg/dL 9.2  9.1      Vitals:   11/27/24 2138 11/28/24 0430 11/28/24 0735 11/28/24 0742  BP: (!) 149/76 (!) 151/82  (!) 167/90  Pulse: (!) 56 70  97  Resp: 16 16  20   Temp: 98.6 F (37 C) 97.7 F (36.5 C)  98 F (36.7 C)  TempSrc: Oral Oral  Oral  SpO2: 100% 97% 97% 97%  Weight:      Height:        Author: Drue ONEIDA Potter, MD 11/28/2024 2:14 PM  For on call review [email or web address].  "

## 2024-11-28 NOTE — Plan of Care (Signed)
" °  Problem: Clinical Measurements: Goal: Ability to maintain clinical measurements within normal limits will improve Outcome: Progressing Goal: Diagnostic test results will improve Outcome: Progressing Goal: Respiratory complications will improve Outcome: Progressing Goal: Cardiovascular complication will be avoided Outcome: Progressing   Problem: Activity: Goal: Risk for activity intolerance will decrease Outcome: Progressing   Problem: Nutrition: Goal: Adequate nutrition will be maintained Outcome: Progressing   Problem: Elimination: Goal: Will not experience complications related to bowel motility Outcome: Progressing Goal: Will not experience complications related to urinary retention Outcome: Progressing   Problem: Skin Integrity: Goal: Risk for impaired skin integrity will decrease Outcome: Progressing   "

## 2024-11-29 DIAGNOSIS — R6520 Severe sepsis without septic shock: Secondary | ICD-10-CM | POA: Diagnosis not present

## 2024-11-29 DIAGNOSIS — A419 Sepsis, unspecified organism: Secondary | ICD-10-CM | POA: Diagnosis not present

## 2024-11-29 DIAGNOSIS — J9601 Acute respiratory failure with hypoxia: Secondary | ICD-10-CM

## 2024-11-29 LAB — CBC WITH DIFFERENTIAL/PLATELET
Abs Immature Granulocytes: 0.1 10*3/uL — ABNORMAL HIGH (ref 0.00–0.07)
Basophils Absolute: 0 10*3/uL (ref 0.0–0.1)
Basophils Relative: 0 %
Eosinophils Absolute: 0 10*3/uL (ref 0.0–0.5)
Eosinophils Relative: 0 %
HCT: 30.9 % — ABNORMAL LOW (ref 36.0–46.0)
Hemoglobin: 10.1 g/dL — ABNORMAL LOW (ref 12.0–15.0)
Immature Granulocytes: 1 %
Lymphocytes Relative: 10 %
Lymphs Abs: 1.2 10*3/uL (ref 0.7–4.0)
MCH: 29.2 pg (ref 26.0–34.0)
MCHC: 32.7 g/dL (ref 30.0–36.0)
MCV: 89.3 fL (ref 80.0–100.0)
Monocytes Absolute: 1.8 10*3/uL — ABNORMAL HIGH (ref 0.1–1.0)
Monocytes Relative: 15 %
Neutro Abs: 9.2 10*3/uL — ABNORMAL HIGH (ref 1.7–7.7)
Neutrophils Relative %: 74 %
Platelets: 408 10*3/uL — ABNORMAL HIGH (ref 150–400)
RBC: 3.46 MIL/uL — ABNORMAL LOW (ref 3.87–5.11)
RDW: 14.7 % (ref 11.5–15.5)
WBC: 12.4 10*3/uL — ABNORMAL HIGH (ref 4.0–10.5)
nRBC: 0 % (ref 0.0–0.2)

## 2024-11-29 LAB — BASIC METABOLIC PANEL WITH GFR
Anion gap: 9 (ref 5–15)
BUN: 12 mg/dL (ref 8–23)
CO2: 26 mmol/L (ref 22–32)
Calcium: 8.8 mg/dL — ABNORMAL LOW (ref 8.9–10.3)
Chloride: 103 mmol/L (ref 98–111)
Creatinine, Ser: 0.75 mg/dL (ref 0.44–1.00)
GFR, Estimated: >60 mL/min
Glucose, Bld: 77 mg/dL (ref 70–99)
Potassium: 3.6 mmol/L (ref 3.5–5.1)
Sodium: 138 mmol/L (ref 135–145)

## 2024-11-29 LAB — GLUCOSE, CAPILLARY
Glucose-Capillary: 76 mg/dL (ref 70–99)
Glucose-Capillary: 110 mg/dL — ABNORMAL HIGH (ref 70–99)
Glucose-Capillary: 105 mg/dL — ABNORMAL HIGH (ref 70–99)

## 2024-11-29 LAB — HIV ANTIBODY (ROUTINE TESTING W REFLEX): HIV Screen 4th Generation wRfx: NONREACTIVE

## 2024-11-29 MED ORDER — GLUCERNA SHAKE PO LIQD
237.0000 mL | Freq: Two times a day (BID) | ORAL | Status: DC
  Administered 2024-11-29 – 2024-11-30 (×2): 237 mL via ORAL

## 2024-11-29 MED ORDER — POLYETHYLENE GLYCOL 3350 17 G PO PACK
17.0000 g | PACK | Freq: Every day | ORAL | Status: DC
  Administered 2024-11-29 – 2024-11-30 (×2): 17 g via ORAL
  Filled 2024-11-29 (×2): qty 1

## 2024-11-29 MED ORDER — BUDESONIDE 0.25 MG/2ML IN SUSP
RESPIRATORY_TRACT | Status: AC
  Filled 2024-11-29: qty 2

## 2024-11-29 MED ORDER — ADULT MULTIVITAMIN W/MINERALS CH
1.0000 | ORAL_TABLET | Freq: Every day | ORAL | Status: DC
  Administered 2024-11-29 – 2024-11-30 (×2): 1 via ORAL
  Filled 2024-11-29 (×2): qty 1

## 2024-11-29 MED ORDER — DOXYCYCLINE HYCLATE 100 MG PO TABS: 100.0000 mg | ORAL_TABLET | Freq: Two times a day (BID) | ORAL | Status: DC

## 2024-11-29 MED ORDER — BUDESONIDE 0.5 MG/2ML IN SUSP
0.5000 mg | Freq: Two times a day (BID) | RESPIRATORY_TRACT | Status: DC
  Administered 2024-11-30: 0.5 mg via RESPIRATORY_TRACT
  Filled 2024-11-29: qty 2

## 2024-11-29 MED ORDER — DOXYCYCLINE HYCLATE 100 MG PO TABS
100.0000 mg | ORAL_TABLET | Freq: Two times a day (BID) | ORAL | Status: DC
  Administered 2024-11-29 – 2024-11-30 (×2): 100 mg via ORAL
  Filled 2024-11-29 (×2): qty 1

## 2024-11-29 NOTE — Progress Notes (Signed)
 " Progress Note   Patient: Caitlyn Herrera FMW:[id number] DOB: 03-Feb-1961 DOA: 11/26/2024     3 DOS: the patient was seen and examined on 11/29/2024     Brief hospital course:  Caitlyn Herrera is a 64 y.o. female with medical history significant of DM type 2,  CAD, essential hypertension, HLD, COPD primary lung squamous cell carcinoma, left who presents to the hospital with complaint of fever, nausea/vomiting/ diarrhea and productive cough for 3 days. The patient states that her symptoms started on Wednesday. She started to experience increasing nausea with 3 episodes of diarrhea, this was also associated with a productive yellowish sputum. She states that she had a temperature of 101 at home. She took over the counter medications to help alleviate her symptoms. She also reports using her inhalers at home and nebulizers without significant improvement. She stated that her symptoms were progressing in spite of all these measures and therefore she presented to the hospital for further evaluation.   The patient reports that her last episode of diarrhea was this morning.    ED Course:  In the ER, BP 98/57, HR 71, RR 18, O2 saturation 98% on RA,  and Tmax 102.1. Cbc demonstrated wbc 20.6,  hb/hct 11.7/35.5, and platelet 414. Chemistry demonstrated Na 133, K 3.8, Cl 97, bicarb 25, Bun/Cr 12/0.97 and glucose 163. Respiratory pcr was negative for flu/rsv and covid.  CXR demonstrated persistent retrocardiac left lower lobe consolidation as seen on prior CT. CT chest demonstrated progressive bronchopneumonia and/or malignancy greatest in the lingula and left lower lobe.  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 Continued follow-up is recommended. Infrarenal abdominal aortic aneurysm measuring up to 3.6 cm; recommend ultrasound follow-up in 3 years.      Assessment and Plan:   Sepsis secondary to community-acquired pneumonia Patient presents with temperature up to 102 degrees, WBC 18 and  tachycardia in the setting of pneumonia as shown on CT scan and chest x-ray of the lungs Follow-up blood culture, sputum culture  Imaging:CT chest demonstrated persistent retrocardiac consolidation within the LLL Continue ceftriaxone  and doxycycline  Monitor for oxygen requirement   Squamous cell carcinoma of the lung Patient is following with Dr. Zelphia Cap She has completed her last session of radiation therapy about 4 months ago She is scheduled to have followup with repeat CT      CAD Will hold any hypertensive meds at this time She can continue aspirin  and statin therapy   Essential hypertension Continue to monitor blood pressure closely   Chronic diastolic heart failure Continue daily weight Continue to monitor input and output Continue losartan , Imdur  and carvedilol    DM type 2 Continue sliding scale Monitor glucose closely     DVT prophylaxis: lovenox      Code Status: full    Family Communication: No family at bedside   Disposition Plan: inpatient       Subjective:  Patient seen and examined at bedside this morning Shortness of breath improving Did have a temperature spike up to 100.8 this morning   Physical Exam:  Extraocular Movements: Extraocular movements intact.  Cardiovascular:     Rate and Rhythm: Normal rate and regular rhythm.     Pulses: Normal pulses.     Heart sounds: Normal heart sounds.  Pulmonary:     Effort: Pulmonary effort is normal.  Abdominal:     General: Abdomen is flat. Bowel sounds are normal. There is no distension.     Palpations: Abdomen is soft.  Tenderness: There is no abdominal tenderness.  Musculoskeletal:        General: No deformity. Normal range of motion.  Skin:    General: Skin is warm and dry.     Coloration: Skin is not jaundiced.  Neurological:     General: No focal deficit present.     Mental Status: He is alert and oriented to person, place, and time. Mental status is at baseline.      Data Reviewed:     Latest Ref Rng & Units 11/29/2024    5:19 AM 11/28/2024    5:13 AM 11/27/2024    6:26 AM  CBC  WBC 4.0 - 10.5 K/uL 12.4  25.3  16.2   Hemoglobin 12.0 - 15.0 g/dL 89.8  9.9  89.3   Hematocrit 36.0 - 46.0 % 30.9  30.1  32.2   Platelets 150 - 400 K/uL 408  376  374        Latest Ref Rng & Units 11/29/2024    5:19 AM 11/28/2024    5:13 AM 11/27/2024    6:26 AM  BMP  Glucose 70 - 99 mg/dL 77  893  858   BUN 8 - 23 mg/dL 12  15  14    Creatinine 0.44 - 1.00 mg/dL 9.24  9.34  9.31   Sodium 135 - 145 mmol/L 138  141  141   Potassium 3.5 - 5.1 mmol/L 3.6  3.5  3.9   Chloride 98 - 111 mmol/L 103  108  107   CO2 22 - 32 mmol/L 26  23  23    Calcium  8.9 - 10.3 mg/dL 8.8  9.2  9.1     Vitals:   11/29/24 0438 11/29/24 0745 11/29/24 0757 11/29/24 1541  BP: 138/61  (!) 113/45 123/61  Pulse: 74  (!) 57 71  Resp: 18  16 18   Temp: (!) 100.8 F (38.2 C)  98.4 F (36.9 C) 98.7 F (37.1 C)  TempSrc:   Oral   SpO2: 99% 97% 100% 95%  Weight:      Height:         Author: Drue ONEIDA Potter, MD 11/29/2024 5:48 PM  For on call review [email or web address].  "

## 2024-11-29 NOTE — Progress Notes (Signed)
 PHARMACIST - PHYSICIAN COMMUNICATION  CONCERNING: Antibiotic IV to Oral Route Change Policy  RECOMMENDATION: This patient is receiving doxycycline  by the intravenous route.  Based on criteria approved by the Pharmacy and Therapeutics Committee, the antibiotic(s) is/are being converted to the equivalent oral dose form(s).  DESCRIPTION: These criteria include: Patient being treated for a respiratory tract infection, urinary tract infection, cellulitis or clostridium difficile associated diarrhea if on metronidazole The patient is not neutropenic and does not exhibit a GI malabsorption state The patient is eating (either orally or via tube) and/or has been taking other orally administered medications for a least 24 hours The patient is improving clinically and has a Tmax < 100.5  If you have questions about this conversion, please contact the Pharmacy Department  []   [phone] )  [facility] [x]   [phone] )  [facility] []   [phone] )  [facility] []   [phone] )  [facility] []   [phone] )  [facility]    Thank you for involving pharmacy in this patient's care.   Damien Napoleon, PharmD Clinical Pharmacist 11/29/2024 1:12 PM

## 2024-11-30 ENCOUNTER — Other Ambulatory Visit: Payer: Self-pay

## 2024-11-30 LAB — BASIC METABOLIC PANEL WITH GFR
Anion gap: 10 (ref 5–15)
BUN: 9 mg/dL (ref 8–23)
CO2: 25 mmol/L (ref 22–32)
Calcium: 8.5 mg/dL — ABNORMAL LOW (ref 8.9–10.3)
Chloride: 102 mmol/L (ref 98–111)
Creatinine, Ser: 0.66 mg/dL (ref 0.44–1.00)
GFR, Estimated: >60 mL/min
Glucose, Bld: 113 mg/dL — ABNORMAL HIGH (ref 70–99)
Potassium: 3.2 mmol/L — ABNORMAL LOW (ref 3.5–5.1)
Sodium: 138 mmol/L (ref 135–145)

## 2024-11-30 LAB — CBC WITH DIFFERENTIAL/PLATELET
Abs Immature Granulocytes: 0.14 10*3/uL — ABNORMAL HIGH (ref 0.00–0.07)
Basophils Absolute: 0 10*3/uL (ref 0.0–0.1)
Basophils Relative: 0 %
Eosinophils Absolute: 0.1 10*3/uL (ref 0.0–0.5)
Eosinophils Relative: 0 %
HCT: 32.4 % — ABNORMAL LOW (ref 36.0–46.0)
Hemoglobin: 10.5 g/dL — ABNORMAL LOW (ref 12.0–15.0)
Immature Granulocytes: 1 %
Lymphocytes Relative: 10 %
Lymphs Abs: 1.5 10*3/uL (ref 0.7–4.0)
MCH: 29.2 pg (ref 26.0–34.0)
MCHC: 32.4 g/dL (ref 30.0–36.0)
MCV: 90 fL (ref 80.0–100.0)
Monocytes Absolute: 1.6 10*3/uL — ABNORMAL HIGH (ref 0.1–1.0)
Monocytes Relative: 11 %
Neutro Abs: 11 10*3/uL — ABNORMAL HIGH (ref 1.7–7.7)
Neutrophils Relative %: 78 %
Platelets: 413 10*3/uL — ABNORMAL HIGH (ref 150–400)
RBC: 3.6 MIL/uL — ABNORMAL LOW (ref 3.87–5.11)
RDW: 15 % (ref 11.5–15.5)
WBC: 14.4 10*3/uL — ABNORMAL HIGH (ref 4.0–10.5)
nRBC: 0 % (ref 0.0–0.2)

## 2024-11-30 LAB — LEGIONELLA PNEUMOPHILA SEROGP 1 UR AG: L. pneumophila Serogp 1 Ur Ag: NEGATIVE

## 2024-11-30 LAB — GLUCOSE, CAPILLARY: Glucose-Capillary: 111 mg/dL — ABNORMAL HIGH (ref 70–99)

## 2024-11-30 MED ORDER — AMOXICILLIN-POT CLAVULANATE 875-125 MG PO TABS
1.0000 | ORAL_TABLET | Freq: Two times a day (BID) | ORAL | 0 refills | Status: AC
  Filled 2024-11-30: qty 10, 5d supply, fill #0

## 2024-11-30 MED ORDER — POLYETHYLENE GLYCOL 3350 17 GM/SCOOP PO POWD
17.0000 g | Freq: Every day | ORAL | 0 refills | Status: AC
  Filled 2024-11-30: qty 238, 14d supply, fill #0

## 2024-11-30 MED ORDER — POTASSIUM CHLORIDE CRYS ER 20 MEQ PO TBCR
40.0000 meq | EXTENDED_RELEASE_TABLET | Freq: Two times a day (BID) | ORAL | Status: DC
  Administered 2024-11-30: 40 meq via ORAL
  Filled 2024-11-30: qty 2

## 2024-11-30 MED ORDER — ACETAMINOPHEN 325 MG PO TABS
650.0000 mg | ORAL_TABLET | Freq: Four times a day (QID) | ORAL | 0 refills | Status: AC | PRN
  Filled 2024-11-30: qty 20, 3d supply, fill #0

## 2024-11-30 NOTE — Plan of Care (Signed)
  Problem: Clinical Measurements: Goal: Ability to maintain clinical measurements within normal limits will improve Outcome: Progressing Goal: Will remain free from infection Outcome: Progressing   Problem: Coping: Goal: Level of anxiety will decrease Outcome: Progressing   Problem: Elimination: Goal: Will not experience complications related to bowel motility Outcome: Progressing

## 2024-11-30 NOTE — Progress Notes (Signed)
 Physical Therapy Treatment Patient Details Name: Caitlyn Herrera MRN: [id number] DOB: [birth year] Today's Date: 11/30/2024   History of Present Illness Caitlyn Herrera is a 64 y.o. female with medical history significant of DM type 2,  CAD, essential hypertension, HLD, COPD primary lung squamous cell carcinoma, left who presents to the hospital with complaint of fever, nausea/vomiting/ diarrhea and productive cough for 3 days. The patient states that her symptoms started on Wednesday. She started to experience increasing nausea with 3 episodes of diarrhea, this was also associated with a productive yellowish sputum. She states that she had a temperature of 101 at home. She took over the counter medications to help alleviate her symptoms. She also reports using her inhalers at home and nebulizers without significant improvement. She stated that her symptoms were progressing in spite of all these measures and therefore she presented to the hospital for further evaluation.    PT Comments  Patient seen for PT session focused on functional mobility. Patient required CGA/supervision for 120' feet with RW . Tolerated session well with mild signs of exertion. Vitals remained stable during activity. Main limiting factors today were endurance. Interventions aimed at improving activity tolerance. Patient shows good potential to make progress with continued acute level rehab. Patient continues to demonstrate mild activity restrictions and poor tolerance for progressive mobility. Continued skilled PT recommended to progress toward functional goals and support discharge readiness. Pt making good progress toward goals, will continue to follow POC. Discharge recommendation remains appropriate     If plan is discharge home, recommend the following: A little help with walking and/or transfers;A lot of help with bathing/dressing/bathroom;Assist for transportation   Can travel by private vehicle        Equipment  Recommendations  Rolling walker (2 wheels)    Recommendations for Other Services       Precautions / Restrictions Precautions Precautions: None Restrictions Weight Bearing Restrictions Per Provider Order: No     Mobility  Bed Mobility Overal bed mobility: Modified Independent                  Transfers Overall transfer level: Modified independent                      Ambulation/Gait Ambulation/Gait assistance: Contact guard assist, Min assist Gait Distance (Feet): 120 Feet Assistive device: Rolling walker (2 wheels) Gait Pattern/deviations: Step-to pattern, Step-through pattern, Antalgic, Trunk flexed, Decreased stride length Gait velocity: decreased     General Gait Details: improvement in gait speed and tolerance for continous movement compared to previous session   Stairs             Wheelchair Mobility     Tilt Bed    Modified Rankin (Stroke Patients Only)       Balance Overall balance assessment: Needs assistance Sitting-balance support: Feet unsupported Sitting balance-Leahy Scale: Normal     Standing balance support: Reliant on assistive device for balance, During functional activity Standing balance-Leahy Scale: Good                              Communication Communication Communication: No apparent difficulties  Cognition Arousal: Alert Behavior During Therapy: WFL for tasks assessed/performed   PT - Cognitive impairments: No apparent impairments                         Following commands: Intact  Cueing Cueing Techniques: Verbal cues  Exercises      General Comments        Pertinent Vitals/Pain Pain Assessment Pain Assessment: No/denies pain    Home Living                          Prior Function            PT Goals (current goals can now be found in the care plan section) Acute Rehab PT Goals Patient Stated Goal: Pt reports that she would like to go home PT Goal  Formulation: With patient Time For Goal Achievement: 12/18/24 Potential to Achieve Goals: Good Progress towards PT goals: Progressing toward goals    Frequency    Min 2X/week      PT Plan      Co-evaluation              AM-PAC PT 6 Clicks Mobility   Outcome Measure  Help needed turning from your back to your side while in a flat bed without using bedrails?: None Help needed moving from lying on your back to sitting on the side of a flat bed without using bedrails?: None Help needed moving to and from a bed to a chair (including a wheelchair)?: A Little Help needed standing up from a chair using your arms (e.g., wheelchair or bedside chair)?: A Little Help needed to walk in hospital room?: A Little Help needed climbing 3-5 steps with a railing? : A Little 6 Click Score: 20    End of Session   Activity Tolerance: Patient tolerated treatment well Patient left: in bed;with call bell/phone within reach Nurse Communication: Mobility status PT Visit Diagnosis: Other abnormalities of gait and mobility (R26.89);Other symptoms and signs involving the nervous system (R29.898)     Time: 9044-8991 PT Time Calculation (min) (ACUTE ONLY): 13 min  Charges:    $Therapeutic Activity: 8-22 mins PT General Charges $$ ACUTE PT VISIT: 1 Visit                     Sherlean Lesches DPT, PT     Sherlean A Amelio Brosky 11/30/2024, 10:31 AM

## 2024-12-01 LAB — CULTURE, BLOOD (ROUTINE X 2)
Culture: NO GROWTH
Culture: NO GROWTH

## 2024-12-02 ENCOUNTER — Other Ambulatory Visit: Payer: Self-pay

## 2024-12-02 DIAGNOSIS — C3492 Malignant neoplasm of unspecified part of left bronchus or lung: Secondary | ICD-10-CM

## 2024-12-03 ENCOUNTER — Inpatient Hospital Stay: Attending: Oncology

## 2024-12-03 DIAGNOSIS — C3492 Malignant neoplasm of unspecified part of left bronchus or lung: Secondary | ICD-10-CM

## 2024-12-03 LAB — CMP (CANCER CENTER ONLY)
ALT: 11 U/L (ref 0–44)
AST: 17 U/L (ref 15–41)
Albumin: 3.2 g/dL — ABNORMAL LOW (ref 3.5–5.0)
Alkaline Phosphatase: 89 U/L (ref 38–126)
Anion gap: 13 (ref 5–15)
BUN: 8 mg/dL (ref 8–23)
CO2: 24 mmol/L (ref 22–32)
Calcium: 9.4 mg/dL (ref 8.9–10.3)
Chloride: 101 mmol/L (ref 98–111)
Creatinine: 0.75 mg/dL (ref 0.44–1.00)
GFR, Estimated: >60 mL/min
Glucose, Bld: 108 mg/dL — ABNORMAL HIGH (ref 70–99)
Potassium: 3.9 mmol/L (ref 3.5–5.1)
Sodium: 138 mmol/L (ref 135–145)
Total Bilirubin: 0.6 mg/dL (ref 0.0–1.2)
Total Protein: 6.7 g/dL (ref 6.5–8.1)

## 2024-12-03 LAB — IRON AND TIBC
Iron: 16 ug/dL — ABNORMAL LOW (ref 28–170)
Saturation Ratios: 5 % — ABNORMAL LOW (ref 10.4–31.8)
TIBC: 291 ug/dL (ref 250–450)
UIBC: 276 ug/dL

## 2024-12-03 LAB — CBC WITH DIFFERENTIAL (CANCER CENTER ONLY)
Abs Immature Granulocytes: 0.21 10*3/uL — ABNORMAL HIGH (ref 0.00–0.07)
Basophils Absolute: 0 10*3/uL (ref 0.0–0.1)
Basophils Relative: 0 %
Eosinophils Absolute: 0.1 10*3/uL (ref 0.0–0.5)
Eosinophils Relative: 1 %
HCT: 35 % — ABNORMAL LOW (ref 36.0–46.0)
Hemoglobin: 11.2 g/dL — ABNORMAL LOW (ref 12.0–15.0)
Immature Granulocytes: 2 %
Lymphocytes Relative: 11 %
Lymphs Abs: 1.4 10*3/uL (ref 0.7–4.0)
MCH: 29.2 pg (ref 26.0–34.0)
MCHC: 32 g/dL (ref 30.0–36.0)
MCV: 91.1 fL (ref 80.0–100.0)
Monocytes Absolute: 1.4 10*3/uL — ABNORMAL HIGH (ref 0.1–1.0)
Monocytes Relative: 11 %
Neutro Abs: 9 10*3/uL — ABNORMAL HIGH (ref 1.7–7.7)
Neutrophils Relative %: 75 %
Platelet Count: 524 10*3/uL — ABNORMAL HIGH (ref 150–400)
RBC: 3.84 MIL/uL — ABNORMAL LOW (ref 3.87–5.11)
RDW: 14.9 % (ref 11.5–15.5)
WBC Count: 12.2 10*3/uL — ABNORMAL HIGH (ref 4.0–10.5)
nRBC: 0 % (ref 0.0–0.2)

## 2024-12-03 LAB — RETIC PANEL
Immature Retic Fract: 18.9 % — ABNORMAL HIGH (ref 2.3–15.9)
RBC.: 3.8 MIL/uL — ABNORMAL LOW (ref 3.87–5.11)
Retic Count, Absolute: 57.8 10*3/uL (ref 19.0–186.0)
Retic Ct Pct: 1.5 % (ref 0.4–3.1)
Reticulocyte Hemoglobin: 27.8 pg — ABNORMAL LOW

## 2024-12-03 LAB — FERRITIN: Ferritin: 240 ng/mL (ref 11–307)

## 2024-12-06 ENCOUNTER — Inpatient Hospital Stay: Admitting: Oncology

## 2025-01-28 ENCOUNTER — Ambulatory Visit: Admitting: Medical

## 2025-02-21 ENCOUNTER — Ambulatory Visit: Admitting: Radiation Oncology

## 2025-07-14 ENCOUNTER — Ambulatory Visit: Admitting: Radiation Oncology
